# Patient Record
Sex: Female | Born: 1945 | Race: Black or African American | Hispanic: No | Smoking: Never smoker
Health system: Southern US, Community
[De-identification: ages and names within clinical notes are randomized; demographics above are authoritative.]

## PROBLEM LIST (undated history)

## (undated) DIAGNOSIS — G4733 Obstructive sleep apnea (adult) (pediatric): Secondary | ICD-10-CM

## (undated) DIAGNOSIS — K219 Gastro-esophageal reflux disease without esophagitis: Secondary | ICD-10-CM

## (undated) DIAGNOSIS — M5136 Other intervertebral disc degeneration, lumbar region: Secondary | ICD-10-CM

## (undated) DIAGNOSIS — F419 Anxiety disorder, unspecified: Secondary | ICD-10-CM

## (undated) DIAGNOSIS — M150 Primary generalized (osteo)arthritis: Secondary | ICD-10-CM

## (undated) DIAGNOSIS — I2699 Other pulmonary embolism without acute cor pulmonale: Secondary | ICD-10-CM

## (undated) DIAGNOSIS — R7989 Other specified abnormal findings of blood chemistry: Secondary | ICD-10-CM

## (undated) DIAGNOSIS — R569 Unspecified convulsions: Secondary | ICD-10-CM

## (undated) DIAGNOSIS — M47819 Spondylosis without myelopathy or radiculopathy, site unspecified: Secondary | ICD-10-CM

## (undated) DIAGNOSIS — T7840XA Allergy, unspecified, initial encounter: Secondary | ICD-10-CM

## (undated) DIAGNOSIS — E7800 Pure hypercholesterolemia, unspecified: Secondary | ICD-10-CM

## (undated) DIAGNOSIS — F32A Depression, unspecified: Secondary | ICD-10-CM

## (undated) DIAGNOSIS — M51369 Other intervertebral disc degeneration, lumbar region without mention of lumbar back pain or lower extremity pain: Secondary | ICD-10-CM

## (undated) DIAGNOSIS — G4730 Sleep apnea, unspecified: Secondary | ICD-10-CM

## (undated) DIAGNOSIS — M159 Polyosteoarthritis, unspecified: Secondary | ICD-10-CM

## (undated) DIAGNOSIS — I10 Essential (primary) hypertension: Secondary | ICD-10-CM

## (undated) HISTORY — DX: Depression, unspecified: F32.A

## (undated) HISTORY — DX: Obstructive sleep apnea (adult) (pediatric): G47.33

## (undated) HISTORY — DX: Other intervertebral disc degeneration, lumbar region without mention of lumbar back pain or lower extremity pain: M51.369

## (undated) HISTORY — DX: Other specified abnormal findings of blood chemistry: R79.89

## (undated) HISTORY — PX: OTHER SURGICAL HISTORY: SHX169

## (undated) HISTORY — DX: Gastro-esophageal reflux disease without esophagitis: K21.9

## (undated) HISTORY — DX: Allergy, unspecified, initial encounter: T78.40XA

## (undated) HISTORY — DX: Other intervertebral disc degeneration, lumbar region: M51.36

## (undated) HISTORY — DX: Spondylosis without myelopathy or radiculopathy, site unspecified: M47.819

## (undated) HISTORY — PX: CHOLECYSTECTOMY: SHX55

## (undated) HISTORY — DX: Unspecified convulsions: R56.9

## (undated) HISTORY — DX: Sleep apnea, unspecified: G47.30

## (undated) HISTORY — DX: Anxiety disorder, unspecified: F41.9

## (undated) HISTORY — DX: Pure hypercholesterolemia, unspecified: E78.00

## (undated) HISTORY — PX: ABDOMINAL HYSTERECTOMY: SHX81

## (undated) HISTORY — DX: Primary generalized (osteo)arthritis: M15.0

## (undated) HISTORY — DX: Polyosteoarthritis, unspecified: M15.9

## (undated) HISTORY — DX: Essential (primary) hypertension: I10

## (undated) HISTORY — PX: BREAST BIOPSY: SHX20

---

## 2002-03-27 LAB — HM MAMMOGRAPHY

## 2003-08-21 LAB — HM MAMMOGRAPHY

## 2005-01-31 LAB — HM MAMMOGRAPHY

## 2006-07-26 LAB — HM MAMMOGRAPHY

## 2008-05-26 LAB — HM MAMMOGRAPHY

## 2009-09-14 LAB — HM DEXA SCAN

## 2009-09-29 LAB — HM MAMMOGRAPHY

## 2011-09-14 LAB — HM MAMMOGRAPHY

## 2013-05-29 LAB — HM MAMMOGRAPHY

## 2013-08-13 LAB — HM DEXA SCAN

## 2014-10-15 LAB — HM MAMMOGRAPHY

## 2015-10-27 LAB — HM MAMMOGRAPHY

## 2015-11-24 LAB — HM DEXA SCAN

## 2016-10-02 LAB — COMPREHENSIVE METABOLIC PANEL WITH GFR: EGFR (African American): >60

## 2016-11-24 LAB — HM MAMMOGRAPHY

## 2017-01-03 LAB — COMPREHENSIVE METABOLIC PANEL WITH GFR: EGFR (African American): 56

## 2017-01-03 LAB — TSH: TSH: 0.88 (ref 0.41–5.90)

## 2018-01-07 LAB — TSH: TSH: 0.98 (ref 0.41–5.90)

## 2018-01-07 LAB — COMPREHENSIVE METABOLIC PANEL WITH GFR: EGFR (African American): >60

## 2018-01-07 LAB — HEMOGLOBIN A1C: A1c: 5.6

## 2018-01-16 LAB — HM MAMMOGRAPHY

## 2018-01-28 LAB — HM DEXA SCAN

## 2018-07-09 LAB — COMPREHENSIVE METABOLIC PANEL WITH GFR: EGFR (African American): 83

## 2019-01-06 LAB — TSH: TSH: 0.99 (ref 0.41–5.90)

## 2019-01-06 LAB — COMPREHENSIVE METABOLIC PANEL WITH GFR: EGFR (African American): 83

## 2019-01-06 LAB — HEMOGLOBIN A1C: A1c: 5.6

## 2019-01-22 LAB — HM MAMMOGRAPHY

## 2019-07-10 LAB — COMPREHENSIVE METABOLIC PANEL WITH GFR: EGFR (African American): 87

## 2020-02-05 LAB — COMPREHENSIVE METABOLIC PANEL WITH GFR: EGFR (African American): 71

## 2020-03-23 LAB — HM DEXA SCAN

## 2020-05-17 LAB — HM MAMMOGRAPHY

## 2021-03-10 LAB — COMPREHENSIVE METABOLIC PANEL WITH GFR: EGFR (African American): 71

## 2021-07-13 LAB — HM MAMMOGRAPHY

## 2021-09-28 ENCOUNTER — Ambulatory Visit (INDEPENDENT_AMBULATORY_CARE_PROVIDER_SITE_OTHER): Payer: Medicare HMO | Admitting: Family Medicine

## 2021-09-28 ENCOUNTER — Encounter: Payer: Self-pay | Admitting: Family Medicine

## 2021-09-28 VITALS — BP 185/94 | HR 76 | Temp 98.5°F | Ht 62.0 in | Wt 195.4 lb

## 2021-09-28 DIAGNOSIS — R32 Unspecified urinary incontinence: Secondary | ICD-10-CM | POA: Diagnosis not present

## 2021-09-28 DIAGNOSIS — Z87898 Personal history of other specified conditions: Secondary | ICD-10-CM | POA: Diagnosis not present

## 2021-09-28 DIAGNOSIS — E782 Mixed hyperlipidemia: Secondary | ICD-10-CM | POA: Diagnosis not present

## 2021-09-28 DIAGNOSIS — Z7689 Persons encountering health services in other specified circumstances: Secondary | ICD-10-CM

## 2021-09-28 DIAGNOSIS — K219 Gastro-esophageal reflux disease without esophagitis: Secondary | ICD-10-CM | POA: Diagnosis not present

## 2021-09-28 DIAGNOSIS — Z8669 Personal history of other diseases of the nervous system and sense organs: Secondary | ICD-10-CM

## 2021-09-28 DIAGNOSIS — I10 Essential (primary) hypertension: Secondary | ICD-10-CM

## 2021-09-28 DIAGNOSIS — G4733 Obstructive sleep apnea (adult) (pediatric): Secondary | ICD-10-CM

## 2021-09-28 DIAGNOSIS — J302 Other seasonal allergic rhinitis: Secondary | ICD-10-CM | POA: Diagnosis not present

## 2021-09-28 DIAGNOSIS — E785 Hyperlipidemia, unspecified: Secondary | ICD-10-CM | POA: Insufficient documentation

## 2021-09-28 MED ORDER — SPIRONOLACTONE 25 MG PO TABS: 25.0000 mg | ORAL_TABLET | Freq: Every day | ORAL | 3 refills | Status: DC

## 2021-09-28 MED ORDER — FQ NU-FIT ADULT BRIEF XL MISC: 1.0000 | 11 refills | Status: DC | PRN

## 2021-09-28 MED ORDER — LOSARTAN POTASSIUM 100 MG PO TABS: 100.0000 mg | ORAL_TABLET | Freq: Every day | ORAL | 3 refills | Status: DC

## 2021-09-28 MED ORDER — METOPROLOL SUCCINATE ER 25 MG PO TB24: 25.0000 mg | ORAL_TABLET | Freq: Every day | ORAL | 3 refills | Status: DC

## 2021-09-28 MED ORDER — ATORVASTATIN CALCIUM 40 MG PO TABS: 40.0000 mg | ORAL_TABLET | Freq: Every day | ORAL | 3 refills | Status: DC

## 2021-09-28 MED ORDER — MONTELUKAST SODIUM 10 MG PO TABS: 10.0000 mg | ORAL_TABLET | Freq: Every day | ORAL | 3 refills | Status: DC

## 2021-09-28 MED ORDER — TOPIRAMATE 100 MG PO TABS: 100.0000 mg | ORAL_TABLET | Freq: Two times a day (BID) | ORAL | 3 refills | Status: DC

## 2021-09-28 NOTE — Progress Notes (Signed)
? ? ?Patient presents to clinic today to establish care and follow-up on ongoing concerns. ?Patient accompanied by her daughter. ? ?SUBJECTIVE: ?PMH: ?Patient is a 76 year old female with past medical history significant for HTN, arthritis, history of depression, GERD, allergies, HLD, migraines, seizures, urinary incontinence who was previously seen in [place] at [facility] by Dr. Lou Miner. ? ?HTN: ?-Taking spironolactone 25 mg daily, metoprolol XL 25 mg daily, and losartan 100 mg daily ?-Patient endorses med compliance. ?-Requesting refills. ?-Endorses history of OSA.  Not currently on CPAP. ? ?OSA: ?-Previously on CPAP however notes difficulty wearing mask. ?-Has not worn/has not had CPAP in 3 years. ?-Was supposed to schedule a new sleep study prior to leaving [place] in order to get CPAP back. ? ?Migraines: ?-Patient taking topiramate 100 mg twice daily ? ?HLD: ?-Taking atorvastatin 40 mg nightly ? ?Anxiety/depression: ?-Patient states "nerves bother" her. ?-States was on medication several years ago but not currently on medication. ? ?History of arthritis ?-Pt status post bilateral total shoulder replacements ?-Also mentions arthritis in hands. ? ?Pt mentions she was seeing pain clinic in [place] seen 2 months ago.  Did not mention medication or reason for medication. ? ?Seizure history: ?-Unclear formal diagnosis given ?-Per daughter patient had not episode September 2022, bit tongue at time of episode ?-States testing at that time was negative. ? ?Urinary incontinence: ?-Patient endorses urgency/unable to make it to the restroom ?-Patient had 6 children ?-Wearing depends ?-Inquires about prescription for adult briefs. ? ?Seasonal allergies: ?-Taking Singulair 10 mg daily ?-Has Flonase but does not like putting things in her nose ?-Started using OTC Allegra-D a few days ago ? ?GERD: ?-Nexium as needed ? ?Pt inquires about weight loss medication. ? ?Allergies: NKDA, NKFA ? ?Past surgical  history: ?-Bilateral total shoulder replacements ?-Cholecystectomy ?-Hysterectomy 2/2 AUB ?-Breast biopsy, with negative results. ? ?Social history: ?Patient is widowed.  She is a Programmer, systems.  Patient had 6 children.  Patient denies tobacco, alcohol, drug use. ? ?Family medical history: ?Mom-deceased, pancreatic cancer, HTN ?-Father-deceased ?Sister, Velva Harman, alive, thyroid cancer, diabetes, HTN ?Brother-Allen, deceased, prostate cancer?,  Tobacco use ?Daughter-Alive, renal cancer, diabetes ?Son-stroke ?Paternal grandmother gout, HTN ? ?Health Maintenance: ?Dental -- ?Vision --2 years ago ?Immunizations -- ?Colonoscopy -- ?Mammogram -- ?PAP --status post hysterectomy.  Over 40 years ago. ?Bone Density --  ? ?Social History  ? ?Socioeconomic History  ? Marital status: Unknown  ?  Spouse name: Not on file  ? Number of children: Not on file  ? Years of education: Not on file  ? Highest education level: Not on file  ?Occupational History  ? Not on file  ?Tobacco Use  ? Smoking status: Not on file  ? Smokeless tobacco: Not on file  ?Substance and Sexual Activity  ? Alcohol use: Not on file  ? Drug use: Not on file  ? Sexual activity: Not on file  ?Other Topics Concern  ? Not on file  ?Social History Narrative  ? Not on file  ? ?Social Determinants of Health  ? ?Financial Resource Strain: Not on file  ?Food Insecurity: Not on file  ?Transportation Needs: Not on file  ?Physical Activity: Not on file  ?Stress: Not on file  ?Social Connections: Not on file  ?Intimate Partner Violence: Not on file  ? ? ?ROS ?General: Denies fever, chills, night sweats, changes in weight, changes in appetite + weight gain ?HEENT: Denies headaches, ear pain, changes in vision, rhinorrhea, sore throat  + headaches/migraines, nasal drainage ?  CV: Denies CP, palpitations, SOB, orthopnea ?Pulm: Denies SOB, cough, wheezing ?GI: Denies abdominal pain, nausea, vomiting, diarrhea, constipation + GERD ?GU: Denies dysuria, hematuria, frequency,  vaginal discharge ?Msk: Denies muscle cramps + joint pain ?Neuro: Denies weakness, numbness, tingling ?Skin: Denies rashes, bruising ?Psych: Denies depression, anxiety, hallucinations ? ?BP (!) 185/94 (BP Location: Right Arm, Patient Position: Sitting, Cuff Size: Normal)   Pulse 76   Temp 98.5 ?F (36.9 ?C) (Oral)   Ht '5\' 2"'$  (1.575 m)   Wt 195 lb 6.4 oz (88.6 kg)   SpO2 98%   BMI 35.74 kg/m?  ? ?Physical Exam ?Gen. Pleasant, well developed, well-nourished, in NAD, mild confusion with answering some questions. ?HEENT - [place]/AT, PERRL, EOMI, conjunctive clear, no scleral icterus, no nasal drainage, pharynx without erythema or exudate.  TMs normal bilaterally. ?Neck: No JVD, no thyromegaly, no carotid bruits ?Lungs: no use of accessory muscles, CTAB, no wheezes, rales or rhonchi ?Cardiovascular: RRR,  No r/g/m, no peripheral edema ?Abdomen: BS present, soft, nontender, nondistended. ?Musculoskeletal: No deformities, moves all four extremities, no cyanosis or clubbing, normal tone ?Neuro:  A&Ox3, CN II-XII intact, normal gait ?Skin:  Warm, dry, intact, no lesions ? ?No results found for this or any previous visit (from the past 2160 hour(s)). ? ?Assessment/Plan: ? ? On day of service, 45 minutes spent caring for this patient face-to-face, reviewing the chart, counseling and/or coordinating care for plan and treatment of diagnosis below.   ? ?Essential hypertension ?-Uncontrolled ?-Discussed the importance of medication compliance and lifestyle modifications ?--Continue current medications including losartan 100 mg daily, Toprol-XL 25 mg daily, spironolactone 25 mg daily ?-h/o OSA may also be contributing to elevation.  Will place referral for new sleep study. ?-Discussed recent use of antihistamine with decongestant may also be contributing to elevation in BP. ?-We will have patient check BP at home and bring log to clinic. ?- Plan: losartan (COZAAR) 100 MG tablet, metoprolol succinate (TOPROL-XL) 25 MG 24 hr tablet,  spironolactone (ALDACTONE) 25 MG tablet, Ambulatory referral to Sleep Studies ? ?Gastroesophageal reflux disease, unspecified whether esophagitis present ?-Stable ?-Continue Nexium as needed ?-We will place referral to GI if needed based on information from previous records ? ?Seasonal allergies  ?-Discussed avoiding allergy medicine with decongestant as may cause elevation in BP. ?-Continue Flonase and Singulair 10 mg daily ?-Discussed using a different OTC antihistamine such as Zyrtec, Claritin, Xyzal ?- Plan: montelukast (SINGULAIR) 10 MG tablet ? ?History of migraine ?-Refer to neurology ?-Continue Topamax 100 mg twice daily ?-Discussed headache triggers ?- Plan: topiramate (TOPAMAX) 100 MG tablet, Ambulatory referral to Neurology ? ?Urinary incontinence, unspecified type ?- Plan: Incontinence Supply Disposable (NU-FIT ADULT BRIEF) MISC ? ?Mixed hyperlipidemia ?-Lifestyle modifications ?- Plan: atorvastatin (LIPITOR) 40 MG tablet ? ?History of seizure  ?- Plan: Ambulatory referral to Neurology ? ?OSA (obstructive sleep apnea) ?-Previous history of OSA on CPAP. ?-We will place order for repeat sleep study as has not had CPAP in 3 years. ? - Plan: Ambulatory referral to Sleep Studies, Ambulatory referral to Neurology ? ?Encounter to establish care ?--We reviewed the PMH, PSH, FH, SH, Meds and Allergies. ?-We provided refills for any medications we will prescribe as needed. ?-We addressed current concerns per orders and patient instructions. ?-We have asked for records for pertinent exams, studies, vaccines and notes from previous providers. ?-We have advised patient to follow up per instructions below. ? ?F/u as needed in the next 4-6 weeks for CPE. ? ?Grier Mitts, MD ? ?

## 2021-09-29 NOTE — Addendum Note (Signed)
Addended by: Anderson Malta on: 09/29/2021 02:11 PM ? ? Modules accepted: Orders ? ?

## 2021-10-03 ENCOUNTER — Ambulatory Visit (INDEPENDENT_AMBULATORY_CARE_PROVIDER_SITE_OTHER): Payer: Medicare HMO | Admitting: Family Medicine

## 2021-10-03 ENCOUNTER — Other Ambulatory Visit: Payer: Self-pay

## 2021-10-03 ENCOUNTER — Encounter: Payer: Self-pay | Admitting: Family Medicine

## 2021-10-03 VITALS — BP 147/64 | HR 60 | Temp 97.9°F | Ht 62.0 in | Wt 189.8 lb

## 2021-10-03 DIAGNOSIS — Z0000 Encounter for general adult medical examination without abnormal findings: Secondary | ICD-10-CM | POA: Diagnosis not present

## 2021-10-03 DIAGNOSIS — R32 Unspecified urinary incontinence: Secondary | ICD-10-CM | POA: Diagnosis not present

## 2021-10-03 DIAGNOSIS — E782 Mixed hyperlipidemia: Secondary | ICD-10-CM

## 2021-10-03 DIAGNOSIS — I10 Essential (primary) hypertension: Secondary | ICD-10-CM

## 2021-10-03 DIAGNOSIS — Z1159 Encounter for screening for other viral diseases: Secondary | ICD-10-CM

## 2021-10-03 DIAGNOSIS — R748 Abnormal levels of other serum enzymes: Secondary | ICD-10-CM

## 2021-10-03 LAB — CBC WITH DIFFERENTIAL/PLATELET
Basophils Absolute: 0.1 10*3/uL (ref 0.0–0.1)
Basophils Relative: 1.2 % (ref 0.0–3.0)
Eosinophils Absolute: 0.2 10*3/uL (ref 0.0–0.7)
Eosinophils Relative: 1.7 % (ref 0.0–5.0)
HCT: 40.1 % (ref 36.0–46.0)
Hemoglobin: 12.8 g/dL (ref 12.0–15.0)
Lymphocytes Relative: 24.5 % (ref 12.0–46.0)
Lymphs Abs: 2.4 10*3/uL (ref 0.7–4.0)
MCHC: 32 g/dL (ref 30.0–36.0)
MCV: 85 fl (ref 78.0–100.0)
Monocytes Absolute: 0.4 10*3/uL (ref 0.1–1.0)
Monocytes Relative: 4.1 % (ref 3.0–12.0)
Neutro Abs: 6.8 10*3/uL (ref 1.4–7.7)
Neutrophils Relative %: 68.5 % (ref 43.0–77.0)
Platelets: 219 10*3/uL (ref 150.0–400.0)
RBC: 4.72 Mil/uL (ref 3.87–5.11)
RDW: 13.5 % (ref 11.5–15.5)
WBC: 9.9 10*3/uL (ref 4.0–10.5)

## 2021-10-03 LAB — LIPID PANEL
Cholesterol: 153 mg/dL (ref 0–200)
HDL: 56.4 mg/dL (ref >39.00)
LDL Cholesterol: 79 mg/dL (ref 0–99)
NonHDL: 96.88
Total CHOL/HDL Ratio: 3
Triglycerides: 88 mg/dL (ref 0.0–149.0)
VLDL: 17.6 mg/dL (ref 0.0–40.0)

## 2021-10-03 LAB — POCT URINALYSIS DIPSTICK
Bilirubin, UA: NEGATIVE
Glucose, UA: NEGATIVE
Ketones, UA: NEGATIVE
Leukocytes, UA: NEGATIVE
Nitrite, UA: NEGATIVE
Protein, UA: NEGATIVE
Spec Grav, UA: 1.02 (ref 1.010–1.025)
Urobilinogen, UA: NEGATIVE E.U./dL — AB
pH, UA: 6 (ref 5.0–8.0)

## 2021-10-03 LAB — COMPREHENSIVE METABOLIC PANEL
ALT: 15 U/L (ref 0–35)
AST: 19 U/L (ref 0–37)
Albumin: 4.4 g/dL (ref 3.5–5.2)
Alkaline Phosphatase: 129 U/L — ABNORMAL HIGH (ref 39–117)
BUN: 24 mg/dL — ABNORMAL HIGH (ref 6–23)
CO2: 25 mEq/L (ref 19–32)
Calcium: 9.7 mg/dL (ref 8.4–10.5)
Chloride: 111 mEq/L (ref 96–112)
Creatinine, Ser: 0.92 mg/dL (ref 0.40–1.20)
GFR: 61.16 mL/min (ref >60.00)
Glucose, Bld: 87 mg/dL (ref 70–99)
Potassium: 4 mEq/L (ref 3.5–5.1)
Sodium: 141 mEq/L (ref 135–145)
Total Bilirubin: 0.3 mg/dL (ref 0.2–1.2)
Total Protein: 7.1 g/dL (ref 6.0–8.3)

## 2021-10-03 LAB — T4, FREE: Free T4: 0.68 ng/dL (ref 0.60–1.60)

## 2021-10-03 LAB — TSH: TSH: 0.84 u[IU]/mL (ref 0.35–5.50)

## 2021-10-03 LAB — HEMOGLOBIN A1C: Hgb A1c MFr Bld: 5.8 % (ref 4.6–6.5)

## 2021-10-03 MED ORDER — WINGS HL ADULT BRIEFS/XL MISC: 5 refills | Status: DC

## 2021-10-03 NOTE — Addendum Note (Signed)
Addended by: Grier Mitts R on: 10/03/2021 04:46 PM ? ? Modules accepted: Orders ? ?

## 2021-10-03 NOTE — Progress Notes (Addendum)
Subjective:  ?  ? Gina Key is a 76 y.o. female and is here for a comprehensive physical exam. The patient reports no problems.  Over the wknd, pt visited her son who is in a SNF in [place], [place].  Pt states she was able to get her meds refilled, but the pharmacy did not have the rx for adult briefs.  Pt accompanied by her daughter.  Patient states she had mammogram at the end of 2022.  Still awaiting records from previous provider. ? ?Social History  ? ?Socioeconomic History  ? Marital status: Widowed  ?  Spouse name: Not on file  ? Number of children: Not on file  ? Years of education: Not on file  ? Highest education level: Not on file  ?Occupational History  ? Not on file  ?Tobacco Use  ? Smoking status: Never  ? Smokeless tobacco: Never  ?Substance and Sexual Activity  ? Alcohol use: Not on file  ? Drug use: Not on file  ? Sexual activity: Not on file  ?Other Topics Concern  ? Not on file  ?Social History Narrative  ? Not on file  ? ?Social Determinants of Health  ? ?Financial Resource Strain: Not on file  ?Food Insecurity: Not on file  ?Transportation Needs: Not on file  ?Physical Activity: Not on file  ?Stress: Not on file  ?Social Connections: Not on file  ?Intimate Partner Violence: Not on file  ? ?Health Maintenance  ?Topic Date Due  ? Hepatitis C Screening  Never done  ? TETANUS/TDAP  Never done  ? COLONOSCOPY (Pts 45-51yr Insurance coverage will need to be confirmed)  Never done  ? MAMMOGRAM  Never done  ? Zoster Vaccines- Shingrix (1 of 2) Never done  ? Pneumonia Vaccine 75 Years old (1 - PCV) Never done  ? DEXA SCAN  Never done  ? COVID-19 Vaccine (5 - Booster for Moderna series) 06/27/2021  ? INFLUENZA VACCINE  Completed  ? HPV VACCINES  Aged Out  ? ? ?The following portions of the patient's history were reviewed and updated as appropriate: allergies, current medications, past family history, past medical history, past social history, past surgical history, and problem list. ? ?Review of  Systems ?Pertinent items noted in HPI and remainder of comprehensive ROS otherwise negative.  ? ?Objective:  ? ? BP (!) 147/64 (BP Location: Left Arm, Patient Position: Sitting, Cuff Size: Normal)   Pulse 60   Temp 97.9 ?F (36.6 ?C) (Oral)   Ht 5' 2"  (1.575 m)   Wt 189 lb 12.8 oz (86.1 kg)   SpO2 97%   BMI 34.71 kg/m?  ?General appearance: alert, cooperative, and no distress ?Head: Normocephalic, without obvious abnormality, atraumatic ?Eyes: conjunctivae/corneas clear. PERRL, EOM's intact. Fundi benign. ?Ears: normal TM's and external ear canals both ears ?Nose: Nares normal. Septum midline. Mucosa normal. No drainage or sinus tenderness. ?Throat: lips, mucosa, and tongue normal; teeth and gums normal ?Neck: no adenopathy, no carotid bruit, no JVD, supple, symmetrical, trachea midline, and thyroid not enlarged, symmetric, no tenderness/mass/nodules ?Lungs: clear to auscultation bilaterally ?Heart: regular rate and rhythm, S1, S2 normal, no murmur, click, rub or gallop ?Abdomen: soft, non-tender; bowel sounds normal; no masses,  no organomegaly ?Extremities: extremities normal, atraumatic, no cyanosis or edema ?Pulses: 2+ and symmetric ?Skin: Skin color, texture, turgor normal. No rashes or lesions ?Lymph nodes: Cervical, supraclavicular, and axillary nodes normal. ?Neurologic: Alert and oriented X 3, normal strength and tone. Normal symmetric reflexes. Normal coordination and gait  ?  ?  Assessment:  ? ? Healthy female exam.    ?  ?Plan:  ? ? Anticipatory guidance given including wearing seatbelts, smoke detectors in the home, increasing physical activity, increasing p.o. intake of water and vegetables. ?-labs ?-mammogram done late 2022 per pt.  Awaiting records from previous provider ?-Immunization recommendations to be made once records obtained. ?-Given handout ?-Next EPM 1 year ?See After Visit Summary for Counseling Recommendations  ? ?Essential hypertension ?-Chronic issue ?-Elevated.   ?-Lifestyle  modifications ?-Sleep study referral placed last [facility] 09/28/2021 regarding need for new CPAP ?-Continue current medications including losartan 100 mg daily, Toprol XL 25 mg daily, spironolactone 25 mg daily ?-Encouraged to check BP at home and keep a log to bring to clinic.  For continued elevation greater than 140/90 adjust medication. ?- Plan: CBC with Differential/Platelet, TSH, T4, Free, CMP ? ?Mixed hyperlipidemia ?-Chronic issue ?-Continue Lipitor 40 mg daily ?-Lifestyle modifications ?- Plan: Hemoglobin A1c, Lipid panel ? ?Encounter for hepatitis C screening test for low risk patient ? - Plan: Hep C Antibody ? ?Urinary incontinence, unspecified type ?-Chronic issue ?-Prescription for adult briefs reprinted ?-We will obtain UA and urine culture to rule out infection ?-Consider referral to urology based on previous records when available. ?- Plan: POCT urinalysis dipstick, Incontinence Supply Disposable (WINGS HL ADULT BRIEFS/XL) MISC, UCx ? ?Follow-up in the next 2-3 months, sooner if needed ? ?Update:  Alk phos mildly elevated at 129 with normal LFTs.  A GGT was added to lab orders.  Further recs based on results. ? ?Grier Mitts, MD ? ?

## 2021-10-04 ENCOUNTER — Other Ambulatory Visit (INDEPENDENT_AMBULATORY_CARE_PROVIDER_SITE_OTHER): Payer: Medicare HMO

## 2021-10-04 DIAGNOSIS — R748 Abnormal levels of other serum enzymes: Secondary | ICD-10-CM

## 2021-10-04 LAB — HEPATITIS C ANTIBODY
Hepatitis C Ab: NONREACTIVE
SIGNAL TO CUT-OFF: 0.67 (ref <1.00)

## 2021-10-04 LAB — URINE CULTURE
MICRO NUMBER:: 13122504
Result:: NO GROWTH
SPECIMEN QUALITY:: ADEQUATE

## 2021-10-04 LAB — GAMMA GT: GGT: 23 U/L (ref 7–51)

## 2021-10-05 ENCOUNTER — Other Ambulatory Visit: Payer: Self-pay

## 2021-10-05 DIAGNOSIS — Z13820 Encounter for screening for osteoporosis: Secondary | ICD-10-CM

## 2021-12-05 ENCOUNTER — Ambulatory Visit: Payer: Medicare HMO | Admitting: Neurology

## 2021-12-05 ENCOUNTER — Encounter: Payer: Self-pay | Admitting: Neurology

## 2021-12-05 VITALS — BP 163/79 | HR 61 | Ht 62.0 in | Wt 196.0 lb

## 2021-12-05 DIAGNOSIS — Z820 Family history of epilepsy and other diseases of the nervous system: Secondary | ICD-10-CM

## 2021-12-05 DIAGNOSIS — R351 Nocturia: Secondary | ICD-10-CM

## 2021-12-05 DIAGNOSIS — E669 Obesity, unspecified: Secondary | ICD-10-CM

## 2021-12-05 DIAGNOSIS — G4733 Obstructive sleep apnea (adult) (pediatric): Secondary | ICD-10-CM

## 2021-12-05 NOTE — Patient Instructions (Signed)
It was nice to meet you both today! ? ?Here is what we discussed today and what we came up with as our plan for you:  ?  ?Based on your symptoms and your exam I believe you are still at risk for obstructive sleep apnea and would benefit from re-evaluation as it has been a few years and you no longer have a CPAP machine. We should proceed with a sleep study to determine how severe your sleep apnea is. If you have sleep apnea, I want you to consider treatment with CPAP or autoPAP machine.  ?Please remember, the risks and ramifications of moderate to severe obstructive sleep apnea or OSA are: Cardiovascular disease, including congestive heart failure, stroke, difficult to control hypertension, arrhythmias, and even type 2 diabetes has been linked to untreated OSA. Sleep apnea causes disruption of sleep and sleep deprivation in most cases, which, in turn, can cause recurrent headaches, problems with memory, mood, concentration, focus, and vigilance. Most people with untreated sleep apnea report excessive daytime sleepiness, which can affect their ability to drive. Please do not drive if you feel sleepy.  ? ?I will likely see you back after your sleep study to go over the test results and where to go from there. We will call you after your sleep study to advise about the results (most likely, you will hear from my nurse) and to set up an appointment at the time, as necessary.   ? ?Our sleep lab administrative assistant will call you to schedule your sleep study. If you don't hear back from her by about 2 weeks from now, please feel free to call her at [phone]. You can leave a message with your phone number and concerns, if you get the voicemail box. She will call back as soon as possible.  ? ? ?

## 2021-12-05 NOTE — Progress Notes (Signed)
Subjective:  ?  ?Patient ID: Gina Key is a 76 y.o. female. ? ?HPI ? ? ? ?Gina Age, MD, PhD ?[facility] ?[address], [address] ?[phone] ?[place], [place] [address] ? ?Dear Dr. Volanda Key,  ? ?I saw your patient, Gina Key, upon your kind request, in my Sleep clinic today for initial consultation of her sleep disorder, in particular, concern for underlying obstructive sleep apnea.  The patient is accompanied by her daughter, Gina Key, today. As you know, Gina Key is a 50 right-handed woman with an underlying medical history of reflux disease, allergies, history of migraines, history of seizure (by chart review), lipidemia, arthritis, hypertension, and obesity, who was previously diagnosed with obstructive sleep apnea and placed on positive airway pressure treatment.  She is currently no longer on a CPAP or similar machine, prior testing was several years ago and test results are not available for my review today. Testing was in [place]. I reviewed your office note from 09/28/2021.  Her Epworth sleepiness score is 0/24, fatigue severity score is 30 out of 63.  She reports that she used her machine in the beginning, at least for a few months but then the machine broke.  She reports that she had a nightmare and she threw the machine.  She reports a family history of sleep apnea. Her sister, one son and her other daughter have OSA. She lives with Gina Key, she has a total of 6 kids. She is widowed. She would be willing to get retested and start PAP therapy again. Her bedtime is around 11-11:30 PM and rise time is around 5:30 AM, she does not sleep through the night.  She denies recurrent nocturnal or morning headaches.  She has nocturia about once per average night.  She is working on weight loss, weight has been fluctuating.  She is a non-smoker and does not utilize alcohol and does not drink caffeine daily.  She often wakes up in the early morning hours and has trouble  falling asleep but may doze off for a few more hours. ? ?Her Past Medical History Is Significant For: ?Past Medical History:  ?Diagnosis Date  ? Acid reflux   ? High cholesterol   ? Hypertension   ? ? ?Her Past Surgical History Is Significant For: ?Past Surgical History:  ?Procedure Laterality Date  ? CHOLECYSTECTOMY    ? left and right rotator cuff    ? ? ?Her Family History Is Significant For: ?Family History  ?Problem Relation Key of Onset  ? Pancreatic cancer Mother   ? Thyroid cancer Sister   ? Kidney cancer Daughter   ? Sleep apnea Daughter   ? Sleep apnea Son   ? Stroke Son   ? Sleep apnea Son   ? ? ?Her Social History Is Significant For: ?Social History  ? ?Socioeconomic History  ? Marital status: Widowed  ?  Spouse name: Not on file  ? Number of children: 6  ? Years of education: 1  ? Highest education level: Not on file  ?Occupational History  ? Not on file  ?Tobacco Use  ? Smoking status: Never  ? Smokeless tobacco: Never  ?Vaping Use  ? Vaping Use: Never used  ?Substance and Sexual Activity  ? Alcohol use: Never  ? Drug use: Never  ? Sexual activity: Not on file  ?Other Topics Concern  ? Not on file  ?Social History Narrative  ? Lives at home with daughter  ? Right handed  ? Caffeine: none   ? ?  Social Determinants of Health  ? ?Financial Resource Strain: Not on file  ?Food Insecurity: Not on file  ?Transportation Needs: Not on file  ?Physical Activity: Not on file  ?Stress: Not on file  ?Social Connections: Not on file  ? ? ?Her Allergies Are:  ?No Known Allergies:  ? ?Her Current Medications Are:  ?Outpatient Encounter Medications as of 12/05/2021  ?Medication Sig  ? aspirin 325 MG EC tablet Take 325 mg by mouth daily.  ? atorvastatin (LIPITOR) 40 MG tablet Take 1 tablet (40 mg total) by mouth daily.  ? Incontinence Supply Disposable (WINGS HL ADULT BRIEFS/XL) MISC As needed daily.  ? losartan (COZAAR) 100 MG tablet Take 1 tablet (100 mg total) by mouth daily.  ? metoprolol succinate (TOPROL-XL) 25 MG  24 hr tablet Take 1 tablet (25 mg total) by mouth daily.  ? montelukast (SINGULAIR) 10 MG tablet Take 1 tablet (10 mg total) by mouth at bedtime.  ? potassium chloride (KLOR-CON) 10 MEQ tablet Take 10 mEq by mouth daily.  ? spironolactone (ALDACTONE) 25 MG tablet Take 1 tablet (25 mg total) by mouth daily.  ? topiramate (TOPAMAX) 100 MG tablet Take 1 tablet (100 mg total) by mouth 2 (two) times daily.  ? fluticasone (FLONASE) 50 MCG/ACT nasal spray Place into the nose. (Patient not taking: Reported on 12/05/2021)  ? [DISCONTINUED] aspirin 81 MG EC tablet Take by mouth.  ? ?No facility-administered encounter medications on file as of 12/05/2021.  ?: ? ? ?Review of Systems:  ?Out of a complete 14 point review of systems, all are reviewed and negative with the exception of these symptoms as listed below: ? ?Review of Systems  ?Neurological:   ?     Patient is here alone with her daughter for a sleep consult. She had a sleep study and used CPAP approximately 2-3 years ago. She was dreaming one night and thought her machine was attacking her and she threw it. She was advised to restart per primary care. ESS 0, FSS 30.  ? ?Objective:  ?Neurological Exam ? ?Physical Exam ?Physical Examination:  ? ?Vitals:  ? 12/05/21 1504  ?BP: (!) 163/79  ?Pulse: 61  ? ? ?General Examination: The patient is a very pleasant 76 y.o. female in no acute distress. She appears well-developed and well-nourished and well groomed.  ? ?HEENT: Normocephalic, atraumatic, pupils are equal, round and reactive to light, extraocular tracking is good without limitation to gaze excursion or nystagmus noted. Hearing is grossly intact. Face is symmetric with normal facial animation. Speech is clear with no dysarthria noted. There is no hypophonia. There is no lip, neck/head, jaw or voice tremor. Neck is supple with full range of passive and active motion. There are no carotid bruits on auscultation. Oropharynx exam reveals: mild mouth dryness, adequate dental  hygiene and mild airway crowding secondary to small airway entry and redundant soft palate.  Tonsils on the smaller side.  Tongue protrudes centrally and palate elevates symmetrically.  Neck circumference of 14 1/8 inches.  She has a slight underbite.   ? ?Chest: Clear to auscultation without wheezing, rhonchi or crackles noted. ? ?Heart: S1+S2+0, regular and normal without murmurs, rubs or gallops noted.  ? ?Abdomen: Soft, non-tender and non-distended with normal bowel sounds appreciated on auscultation. ? ?Extremities: There is no obvious edema.    ? ?Skin: Warm and dry without trophic changes noted.  ? ?Musculoskeletal: exam reveals no obvious joint deformities.  ? ?Neurologically:  ?Mental status: The patient is awake, alert and  oriented in all 4 spheres. Her immediate and remote memory, attention, language skills and fund of knowledge are appropriate. There is no evidence of aphasia, agnosia, apraxia or anomia. Speech is clear with normal prosody and enunciation. Thought process is linear. Mood is normal and affect is normal.  ?Cranial nerves II - XII are as described above under HEENT exam.  ?Motor exam: Normal bulk, strength and tone is noted. There is no obvious tremor. Fine motor skills and coordination: grossly intact.  ?Cerebellar testing: No dysmetria or intention tremor. There is no truncal or gait ataxia.  ?Sensory exam: intact to light touch in the upper and lower extremities.  ?Gait, station and balance: She stands easily. No veering to one side is noted. No leaning to one side is noted. Posture is Key-appropriate and stance is narrow based. Gait shows normal stride length and normal pace. No problems turning are noted.  ? ?Assessment and Plan:  ?In summary, Gina Key is a very pleasant 76 y.o.-year old female with an underlying medical history of reflux disease, allergies, history of migraines, history of seizure (by chart review), lipidemia, arthritis, hypertension, and obesity, who  presents for evaluation of her obstructive sleep apnea.  She carries a prior diagnosis of obstructive sleep apnea several years ago and had a CPAP machine but no longer has a machine for the past few years.  She w

## 2021-12-10 DIAGNOSIS — Z0100 Encounter for examination of eyes and vision without abnormal findings: Secondary | ICD-10-CM | POA: Diagnosis not present

## 2021-12-10 DIAGNOSIS — H524 Presbyopia: Secondary | ICD-10-CM | POA: Diagnosis not present

## 2021-12-11 ENCOUNTER — Encounter (HOSPITAL_COMMUNITY): Payer: Self-pay | Admitting: Emergency Medicine

## 2021-12-11 ENCOUNTER — Emergency Department (HOSPITAL_COMMUNITY)
Admission: EM | Admit: 2021-12-11 | Discharge: 2021-12-11 | Disposition: A | Discharge status: Home / Self Care | Payer: Medicare HMO | Attending: Emergency Medicine | Admitting: Emergency Medicine

## 2021-12-11 ENCOUNTER — Emergency Department (HOSPITAL_COMMUNITY): Payer: Medicare HMO

## 2021-12-11 DIAGNOSIS — G40909 Epilepsy, unspecified, not intractable, without status epilepticus: Secondary | ICD-10-CM | POA: Diagnosis not present

## 2021-12-11 DIAGNOSIS — R404 Transient alteration of awareness: Secondary | ICD-10-CM | POA: Diagnosis not present

## 2021-12-11 DIAGNOSIS — M4802 Spinal stenosis, cervical region: Secondary | ICD-10-CM | POA: Diagnosis not present

## 2021-12-11 DIAGNOSIS — Z043 Encounter for examination and observation following other accident: Secondary | ICD-10-CM | POA: Diagnosis not present

## 2021-12-11 DIAGNOSIS — R55 Syncope and collapse: Secondary | ICD-10-CM | POA: Insufficient documentation

## 2021-12-11 DIAGNOSIS — M5021 Other cervical disc displacement,  high cervical region: Secondary | ICD-10-CM | POA: Diagnosis not present

## 2021-12-11 DIAGNOSIS — R4182 Altered mental status, unspecified: Secondary | ICD-10-CM | POA: Diagnosis not present

## 2021-12-11 DIAGNOSIS — Z7982 Long term (current) use of aspirin: Secondary | ICD-10-CM | POA: Diagnosis not present

## 2021-12-11 DIAGNOSIS — R569 Unspecified convulsions: Secondary | ICD-10-CM | POA: Diagnosis not present

## 2021-12-11 DIAGNOSIS — R0902 Hypoxemia: Secondary | ICD-10-CM | POA: Diagnosis not present

## 2021-12-11 DIAGNOSIS — R410 Disorientation, unspecified: Secondary | ICD-10-CM | POA: Diagnosis not present

## 2021-12-11 DIAGNOSIS — Z743 Need for continuous supervision: Secondary | ICD-10-CM | POA: Diagnosis not present

## 2021-12-11 LAB — CBG MONITORING, ED: Glucose-Capillary: 79 mg/dL (ref 70–99)

## 2021-12-11 LAB — CBC WITH DIFFERENTIAL/PLATELET
Abs Immature Granulocytes: 0.05 10*3/uL (ref 0.00–0.07)
Basophils Absolute: 0 10*3/uL (ref 0.0–0.1)
Basophils Relative: 0 %
Eosinophils Absolute: 0.1 10*3/uL (ref 0.0–0.5)
Eosinophils Relative: 1 %
HCT: 37.4 % (ref 36.0–46.0)
Hemoglobin: 11.8 g/dL — ABNORMAL LOW (ref 12.0–15.0)
Immature Granulocytes: 1 %
Lymphocytes Relative: 25 %
Lymphs Abs: 2.3 10*3/uL (ref 0.7–4.0)
MCH: 27.5 pg (ref 26.0–34.0)
MCHC: 31.6 g/dL (ref 30.0–36.0)
MCV: 87.2 fL (ref 80.0–100.0)
Monocytes Absolute: 0.4 10*3/uL (ref 0.1–1.0)
Monocytes Relative: 4 %
Neutro Abs: 6.5 10*3/uL (ref 1.7–7.7)
Neutrophils Relative %: 69 %
Platelets: 200 10*3/uL (ref 150–400)
RBC: 4.29 MIL/uL (ref 3.87–5.11)
RDW: 12.9 % (ref 11.5–15.5)
WBC: 9.3 10*3/uL (ref 4.0–10.5)
nRBC: 0 % (ref 0.0–0.2)

## 2021-12-11 LAB — COMPREHENSIVE METABOLIC PANEL
ALT: 18 U/L (ref 0–44)
AST: 26 U/L (ref 15–41)
Albumin: 3.8 g/dL (ref 3.5–5.0)
Alkaline Phosphatase: 117 U/L (ref 38–126)
Anion gap: 4 — ABNORMAL LOW (ref 5–15)
BUN: 11 mg/dL (ref 8–23)
CO2: 21 mmol/L — ABNORMAL LOW (ref 22–32)
Calcium: 9.1 mg/dL (ref 8.9–10.3)
Chloride: 116 mmol/L — ABNORMAL HIGH (ref 98–111)
Creatinine, Ser: 0.94 mg/dL (ref 0.44–1.00)
GFR, Estimated: >60 mL/min (ref >60)
Glucose, Bld: 107 mg/dL — ABNORMAL HIGH (ref 70–99)
Potassium: 3.6 mmol/L (ref 3.5–5.1)
Sodium: 141 mmol/L (ref 135–145)
Total Bilirubin: 0.6 mg/dL (ref 0.3–1.2)
Total Protein: 6.7 g/dL (ref 6.5–8.1)

## 2021-12-11 LAB — MAGNESIUM: Magnesium: 2 mg/dL (ref 1.7–2.4)

## 2021-12-11 MED ORDER — LEVETIRACETAM 500 MG PO TABS: 500.0000 mg | ORAL_TABLET | Freq: Two times a day (BID) | ORAL | 2 refills | Status: DC

## 2021-12-11 MED ORDER — LEVETIRACETAM IN NACL 1000 MG/100ML IV SOLN
1000.0000 mg | Freq: Once | INTRAVENOUS | Status: AC
  Administered 2021-12-11: 1000 mg via INTRAVENOUS
  Filled 2021-12-11: qty 100

## 2021-12-11 MED ORDER — ACETAMINOPHEN 325 MG PO TABS
650.0000 mg | ORAL_TABLET | Freq: Once | ORAL | Status: AC
Start: 2021-12-11 — End: 2021-12-11
  Administered 2021-12-11: 650 mg via ORAL
  Filled 2021-12-11: qty 2

## 2021-12-11 NOTE — Discharge Instructions (Signed)
Get help right away if: °You have: °A seizure that does not stop after 5 minutes. °Several seizures in a row without a complete recovery between seizures. °A seizure that makes it harder to breathe. °A seizure that leaves you unable to speak or use a part of your body. °You do not wake up right away after a seizure. °You injure yourself during a seizure. °You have confusion or pain right after a seizure. °

## 2021-12-11 NOTE — ED Provider Notes (Signed)
[facility] EMERGENCY DEPARTMENT Provider Note   CSN: [id number] Arrival date & time: 12/11/21  7026     History  Chief Complaint  Patient presents with   Altered Mental Status    Gina Key is a 76 y.o. female brought in by EMS for unresponsive event.  Patient has a history of epilepsy.  She states that she used to have seizures many years ago when her husband was still living.  Patient was found on the floor by her bed unresponsive.  EMS reports that when they arrived she was lying on her left side.  She would open her eyes to voice and make eye contact but would not respond.  Approximately 2 minutes later patient asked to be helped off the floor.  She remained confused for some time stating that it was Friday and she was in the year 2003 but is now alert and oriented.  Patient is unsure of how she ended up on the floor.  She states the last thing she remembers she was trying to get ready to go to church.  Patient had a similar event back in August of 2022 where she " bit my tongue in my sleep, and my family was trying to get ahold of me...and I have no recollection" (Per outside record review). She had extensive work-up including an MRI.  No significant findings were evident at that time.  She is not on any antiepileptic medications.  She denies chest pain, shortness of breath, melena, hematochezia.  She had a slight headache that was bitemporal but now resolved.  She denies changes in vision, unilateral weakness.   Altered Mental Status     Home Medications Prior to Admission medications   Medication Sig Start Date End Date Taking? Authorizing Provider  levETIRAcetam (KEPPRA) 500 MG tablet Take 1 tablet (500 mg total) by mouth 2 (two) times daily. 12/11/21  Yes Margarita Mail, PA-C  aspirin 325 MG EC tablet Take 325 mg by mouth daily.    [provider]  atorvastatin (LIPITOR) 40 MG tablet Take 1 tablet (40 mg total) by mouth daily. 09/28/21   Billie Ruddy, MD  fluticasone (FLONASE) 50 MCG/ACT nasal spray Place into the nose. Patient not taking: Reported on 12/05/2021    [provider]  Incontinence Supply Disposable (WINGS HL ADULT BRIEFS/XL) MISC As needed daily. 10/03/21   Billie Ruddy, MD  losartan (COZAAR) 100 MG tablet Take 1 tablet (100 mg total) by mouth daily. 09/28/21   Billie Ruddy, MD  metoprolol succinate (TOPROL-XL) 25 MG 24 hr tablet Take 1 tablet (25 mg total) by mouth daily. 09/28/21   Billie Ruddy, MD  montelukast (SINGULAIR) 10 MG tablet Take 1 tablet (10 mg total) by mouth at bedtime. 09/28/21   Billie Ruddy, MD  potassium chloride (KLOR-CON) 10 MEQ tablet Take 10 mEq by mouth daily.    [provider]  spironolactone (ALDACTONE) 25 MG tablet Take 1 tablet (25 mg total) by mouth daily. 09/28/21   Billie Ruddy, MD  topiramate (TOPAMAX) 100 MG tablet Take 1 tablet (100 mg total) by mouth 2 (two) times daily. 09/28/21   Billie Ruddy, MD      Allergies    Patient has no known allergies.    Review of Systems   Review of Systems  Physical Exam Updated Vital Signs BP (!) 154/85   Pulse 71   Temp 99.3 F (37.4 C) (Oral)   Resp 16  SpO2 100%  Physical Exam Vitals and nursing note reviewed.  Constitutional:      General: She is not in acute distress.    Appearance: She is well-developed. She is not diaphoretic.  HENT:     Head: Normocephalic and atraumatic.     Comments: No tongue trauma    Right Ear: External ear normal.     Left Ear: External ear normal.     Nose: Nose normal.     Mouth/Throat:     Mouth: Mucous membranes are moist.  Eyes:     General: No scleral icterus.    Conjunctiva/sclera: Conjunctivae normal.  Cardiovascular:     Rate and Rhythm: Normal rate and regular rhythm.     Heart sounds: Normal heart sounds. No murmur heard.   No friction rub. No gallop.  Pulmonary:     Effort: Pulmonary effort is normal. No respiratory distress.     Breath sounds:  Normal breath sounds.  Abdominal:     General: Bowel sounds are normal. There is no distension.     Palpations: Abdomen is soft. There is no mass.     Tenderness: There is no abdominal tenderness. There is no guarding.  Musculoskeletal:     Cervical back: Normal range of motion.  Skin:    General: Skin is warm and dry.  Neurological:     General: No focal deficit present.     Mental Status: She is alert and oriented to person, place, and time.     Cranial Nerves: No cranial nerve deficit.     Sensory: No sensory deficit.     Motor: No weakness.     Coordination: Coordination normal.     Deep Tendon Reflexes: Reflexes normal.  Psychiatric:        Behavior: Behavior normal.    ED Results / Procedures / Treatments   Labs (all labs ordered are listed, but only abnormal results are displayed) Labs Reviewed  COMPREHENSIVE METABOLIC PANEL - Abnormal; Notable for the following components:      Result Value   Chloride 116 (*)    CO2 21 (*)    Glucose, Bld 107 (*)    Anion gap 4 (*)    All other components within normal limits  CBC WITH DIFFERENTIAL/PLATELET - Abnormal; Notable for the following components:   Hemoglobin 11.8 (*)    All other components within normal limits  MAGNESIUM  URINALYSIS, ROUTINE W REFLEX MICROSCOPIC  CBG MONITORING, ED    EKG None  Radiology CT HEAD WO CONTRAST  Result Date: 12/11/2021 CLINICAL DATA:  76 year old female with altered mental status and fall. EXAM: CT HEAD WITHOUT CONTRAST CT CERVICAL SPINE WITHOUT CONTRAST TECHNIQUE: Multidetector CT imaging of the head and cervical spine was performed following the standard protocol without intravenous contrast. Multiplanar CT image reconstructions of the cervical spine were also generated. RADIATION DOSE REDUCTION: This exam was performed according to the departmental dose-optimization program which includes automated exposure control, adjustment of the mA and/or kV according to patient size and/or use of  iterative reconstruction technique. COMPARISON:  None Available. FINDINGS: CT HEAD FINDINGS Brain: No evidence of acute infarction, hemorrhage, hydrocephalus, extra-axial collection or mass lesion/mass effect. Vascular: Carotid atherosclerotic calcifications are noted. Skull: Normal. Negative for fracture or focal lesion. Sinuses/Orbits: No acute finding. Other: None. CT CERVICAL SPINE FINDINGS Alignment: Normal. Skull base and vertebrae: No acute fracture. No primary bone lesion or focal pathologic process. Soft tissues and spinal canal: No prevertebral fluid or swelling. No visible canal hematoma.  Disc levels: Multilevel degenerative disc disease/spondylosis and RIGHT facet arthropathy noted. The following findings are noted: C2-3: A small central disc protrusion is noted with moderate RIGHT bony foraminal narrowing C3-4: Mild central spinal and moderate RIGHT bony foraminal narrowing C4-5: Moderate central spinal and mild to moderate RIGHT bony foraminal narrowing C5-C6: With broad-based disc bulge, mild to moderate central spinal and moderate bony biforaminal narrowing C6-7: With broad-based disc bulge, mild central spinal, mild RIGHT and moderate LEFT bony foraminal narrowing Upper chest: No acute abnormality Other: None IMPRESSION: 1. No evidence of acute intracranial abnormality. 2. No static evidence of acute injury to the cervical spine. Degenerative changes as described. Electronically Signed   By: Margarette Canada M.D.   On: 12/11/2021 11:08   CT Cervical Spine Wo Contrast  Result Date: 12/11/2021 CLINICAL DATA:  76 year old female with altered mental status and fall. EXAM: CT HEAD WITHOUT CONTRAST CT CERVICAL SPINE WITHOUT CONTRAST TECHNIQUE: Multidetector CT imaging of the head and cervical spine was performed following the standard protocol without intravenous contrast. Multiplanar CT image reconstructions of the cervical spine were also generated. RADIATION DOSE REDUCTION: This exam was performed  according to the departmental dose-optimization program which includes automated exposure control, adjustment of the mA and/or kV according to patient size and/or use of iterative reconstruction technique. COMPARISON:  None Available. FINDINGS: CT HEAD FINDINGS Brain: No evidence of acute infarction, hemorrhage, hydrocephalus, extra-axial collection or mass lesion/mass effect. Vascular: Carotid atherosclerotic calcifications are noted. Skull: Normal. Negative for fracture or focal lesion. Sinuses/Orbits: No acute finding. Other: None. CT CERVICAL SPINE FINDINGS Alignment: Normal. Skull base and vertebrae: No acute fracture. No primary bone lesion or focal pathologic process. Soft tissues and spinal canal: No prevertebral fluid or swelling. No visible canal hematoma. Disc levels: Multilevel degenerative disc disease/spondylosis and RIGHT facet arthropathy noted. The following findings are noted: C2-3: A small central disc protrusion is noted with moderate RIGHT bony foraminal narrowing C3-4: Mild central spinal and moderate RIGHT bony foraminal narrowing C4-5: Moderate central spinal and mild to moderate RIGHT bony foraminal narrowing C5-C6: With broad-based disc bulge, mild to moderate central spinal and moderate bony biforaminal narrowing C6-7: With broad-based disc bulge, mild central spinal, mild RIGHT and moderate LEFT bony foraminal narrowing Upper chest: No acute abnormality Other: None IMPRESSION: 1. No evidence of acute intracranial abnormality. 2. No static evidence of acute injury to the cervical spine. Degenerative changes as described. Electronically Signed   By: Margarette Canada M.D.   On: 12/11/2021 11:08    Procedures Procedures  Time  Medications Ordered in ED Medications  levETIRAcetam (KEPPRA) IVPB 1000 mg/100 mL premix (0 mg Intravenous Stopped 12/11/21 1349)  levETIRAcetam (KEPPRA) IVPB 1000 mg/100 mL premix (0 mg Intravenous Stopped 12/11/21 1349)  acetaminophen (TYLENOL) tablet 650 mg (650  mg Oral Given 12/11/21 1420)    ED Course/ Medical Decision Making/ A&P Clinical Course as of 12/11/21 1556  Sun Dec 11, 2021  1223 Case discussed with Dr. Cheral Marker- recommends 2 gram IV keppra loading dose, 500 mg po keppra BID. OP neuro and EEG follow up [AH]    Clinical Course User Index [AH] Margarita Mail, PA-C                           Medical Decision Making 76 year old female who presents with loss of consciousness, altered mental status and likely seizure. The differential diagnosis for AMS is extensive and includes, but is not limited to: drug overdose - opioids,  alcohol, sedatives, antipsychotics, drug withdrawal, others; Metabolic: hypoxia, hypoglycemia, hyperglycemia, hypercalcemia, hypernatremia, hyponatremia, uremia, hepatic encephalopathy, hypothyroidism, hyperthyroidism, vitamin B12 or thiamine deficiency, carbon monoxide poisoning, Wilson's disease, Lactic acidosis,DKA/HHOS; Infectious: meningitis, encephalitis, bacteremia/sepsis, urinary tract infection, pneumonia, neurosyphilis; Structural: Space-occupying lesion, (brain tumor, subdural hematoma, hydrocephalus,); Vascular: stroke, subarachnoid hemorrhage, coronary ischemia, hypertensive encephalopathy, CNS vasculitis, thrombotic thrombocytopenic purpura, disseminated intravascular coagulation, hyperviscosity; Psychiatric: Schizophrenia, depression; Other: Seizure, hypothermia, heat stroke, ICU psychosis, dementia -"sundowning."  Review of all data points and work-p patient appears to have had a seizure.  I discussed the case with Dr. Cheral Marker who is in agreement and recommends that the patient get started on Keppra with a 2000 mg loading dose.  Patient does not appear to have significant electrolyte disturbance.  CT head imaging negative for acute abnormality including head bleed.  Seizure precautions and follow-up discussed with patient and her family at bedside.  Amount and/or Complexity of Data Reviewed Labs: ordered.     Details: I reviewed all of the patient's labs.  She has mild hyperglycemia, hemoglobin 11.8, no significant metabolic derangements including a normal magnesium level Radiology: ordered.    Details: I ordered reviewed visualized and interpreted head and cervical spine CT which shows no acute findings  Risk OTC drugs. Prescription drug management.      Final Clinical Impression(s) / ED Diagnoses Final diagnoses:  Seizure ([place])    Rx / DC Orders ED Discharge Orders          Ordered    levETIRAcetam (KEPPRA) 500 MG tablet  2 times daily        12/11/21 1407    Ambulatory referral to Neurology       Comments: An appointment is requested in approximately: 2 weeks   12/11/21 1409              Margarita Mail, PA-C 12/11/21 1556    Blanchie Dessert, MD 12/15/21 [phone]

## 2021-12-11 NOTE — ED Triage Notes (Signed)
PT BIB [facility] with reports of possible seizures. Family found patient lying in the floor of her bedroom. EMS reports when they arrived pt would respond to voice. PT was confused. PT is alert and oriented x 3.

## 2021-12-13 ENCOUNTER — Encounter: Payer: Self-pay | Admitting: Neurology

## 2021-12-13 ENCOUNTER — Ambulatory Visit: Payer: Medicare HMO | Admitting: Neurology

## 2021-12-13 VITALS — BP 146/73 | HR 59 | Ht 62.0 in | Wt 193.8 lb

## 2021-12-13 DIAGNOSIS — R569 Unspecified convulsions: Secondary | ICD-10-CM | POA: Diagnosis not present

## 2021-12-13 DIAGNOSIS — R4189 Other symptoms and signs involving cognitive functions and awareness: Secondary | ICD-10-CM

## 2021-12-13 NOTE — Progress Notes (Signed)
Subjective:    Patient ID: Gina Key is a 76 y.o. female.  HPI    Interim history:   Ms. Gina Key is a 76 year old right-handed woman with an underlying medical history of reflux disease, allergies, history of migraines, history of seizure (by chart review), lipidemia, arthritis, hypertension, and obesity, who Presents for a new problem visit for a suspected seizure.  She is referred by the emergency room.  I recently saw her on 12/05/2021 at the request of her primary care physician for evaluation of her sleep apnea.  The patient is accompanied by her daughter today.  She presented to the emergency room on 12/11/2021 via EMS.  She she was found lying on the floor on her side, she appeared confused, no convulsion or twitching was seen.  No evidence of tongue bite or urinary incontinence or bowel incontinence.  I reviewed the emergency room records from 12/11/2021.  She had a CT head without contrast and cervical spine CT without contrast on 12/11/2021 and I reviewed the results: IMPRESSION: 1. No evidence of acute intracranial abnormality. 2. No static evidence of acute injury to the cervical spine. Degenerative changes as described.   She has been given IV Keppra 2 g and and started on oral Keppra 500 mg twice daily.  Laboratory testing showed sodium of 141, potassium on the lower end at 3.6, chloride 116, slightly elevated, glucose 107, BUN 11, creatinine 0.94, normal AST and ALT at 26 and 18, respectively.  CBC with differential and platelet benign with the exception of borderline low hemoglobin at 11.8.  Magnesium was normal at 2.0.  Of note, she had a brain MRI without contrast through [facility] on 03/04/2021 and I reviewed the results: Impression: 1. No acute intracranial process. No acute infarct.  2. Age-related cerebral atrophy and chronic small vessel white matter disease.   She had presented to the emergency room with at [facility] in [place],  [place] on 03/04/2021 with a chief complaint of memory loss and having bitten her tongue in her sleep.  She reported a memory lapse.  She also reported recurrent headaches for about 3 weeks.  She had a head CT without contrast on 03/04/2021 and I reviewed the results: Impression: No acute intracranial abnormality.  Today, 12/13/2021: She reports feeling okay, no new complaints, daughter feels that she is not back to normal yet, she seems sluggish in her thinking and confused at times.  She has not had any bruising and no injuries, daughter reports that she found her in a small space at the foot end of her bed on the floor mumbling and fidgeting with her walker.  She had not hurt herself against the foot end of the bed or sustaining any bruising.  Patient does not recall falling.  She does not recall feeling lightheaded or dizzy or nauseated.  She was getting dressed in the morning and her daughter checked on her to see what she would like for breakfast.  Shortly before patient had a conversation with the son-in-law in the kitchen.  She has had some milder recurrent headaches.  She admits that she does not hydrate well with water, estimates that she drinks 1 or 2 bottles of water per day.  She drinks occasional soda, no coffee.  She sustained no tongue bite and no bladder or bowel incontinence.  She is on Keppra 500 mg twice daily.  Previously:   12/05/21: (She) was previously diagnosed with obstructive sleep apnea  and placed on positive airway pressure treatment.  She is currently no longer on a CPAP or similar machine, prior testing was several years ago and test results are not available for my review today. Testing was in [place]. I reviewed your office note from 09/28/2021.  Her Epworth sleepiness score is 0/24, fatigue severity score is 30 out of 63.  She reports that she used her machine in the beginning, at least for a few months but then the machine broke.  She reports that she had a nightmare and she threw the  machine.  She reports a family history of sleep apnea. Her sister, one son and her other daughter have OSA. She lives with Gina Key, she has a total of 6 kids. She is widowed. She would be willing to get retested and start PAP therapy again. Her bedtime is around 11-11:30 PM and rise time is around 5:30 AM, she does not sleep through the night.  She denies recurrent nocturnal or morning headaches.  She has nocturia about once per average night.  She is working on weight loss, weight has been fluctuating.  She is a non-smoker and does not utilize alcohol and does not drink caffeine daily.  She often wakes up in the early morning hours and has trouble falling asleep but may doze off for a few more hours.  Her Past Medical History Is Significant For: Past Medical History:  Diagnosis Date   Acid reflux    High cholesterol    Hypertension     Her Past Surgical History Is Significant For: Past Surgical History:  Procedure Laterality Date   CHOLECYSTECTOMY     left and right rotator cuff      Her Family History Is Significant For: Family History  Problem Relation Age of Onset   Pancreatic cancer Mother    Thyroid cancer Sister    Seizures Paternal Grandmother    Kidney cancer Daughter    Sleep apnea Daughter    Sleep apnea Son    Stroke Son    Seizures Son    Sleep apnea Son    Seizures Grandson     Her Social History Is Significant For: Social History   Socioeconomic History   Marital status: Widowed    Spouse name: Not on file   Number of children: 6   Years of education: 42   Highest education level: Not on file  Occupational History   Not on file  Tobacco Use   Smoking status: Never   Smokeless tobacco: Never  Vaping Use   Vaping Use: Never used  Substance and Sexual Activity   Alcohol use: Never   Drug use: Never   Sexual activity: Not on file  Other Topics Concern   Not on file  Social History Narrative   Lives at home with daughter   Right handed   Caffeine: none     Social Determinants of Health   Financial Resource Strain: Not on file  Food Insecurity: Not on file  Transportation Needs: Not on file  Physical Activity: Not on file  Stress: Not on file  Social Connections: Not on file    Her Allergies Are:  No Known Allergies:   Her Current Medications Are:  Outpatient Encounter Medications as of 12/13/2021  Medication Sig   aspirin 325 MG EC tablet Take 325 mg by mouth daily.   atorvastatin (LIPITOR) 40 MG tablet Take 1 tablet (40 mg total) by mouth daily.   fluticasone (FLONASE) 50 MCG/ACT nasal spray Place  into the nose.   Incontinence Supply Disposable (WINGS HL ADULT BRIEFS/XL) MISC As needed daily.   levETIRAcetam (KEPPRA) 500 MG tablet Take 1 tablet (500 mg total) by mouth 2 (two) times daily.   losartan (COZAAR) 100 MG tablet Take 1 tablet (100 mg total) by mouth daily.   metoprolol succinate (TOPROL-XL) 25 MG 24 hr tablet Take 1 tablet (25 mg total) by mouth daily.   montelukast (SINGULAIR) 10 MG tablet Take 1 tablet (10 mg total) by mouth at bedtime.   potassium chloride (KLOR-CON) 10 MEQ tablet Take 10 mEq by mouth daily.   spironolactone (ALDACTONE) 25 MG tablet Take 1 tablet (25 mg total) by mouth daily.   topiramate (TOPAMAX) 100 MG tablet Take 1 tablet (100 mg total) by mouth 2 (two) times daily.   No facility-administered encounter medications on file as of 12/13/2021.  :  Review of Systems:  Out of a complete 14 point review of systems, all are reviewed and negative with the exception of these symptoms as listed below: Review of Systems  Neurological:        Pt is here for seizure follow up pt went to ED Sunday because daughter found patient on floor unresponsive no CPR  needed . Pt states ED  recommended  EEG  . Pt has questions about Medication    Objective:  Neurological Exam  Physical Exam Physical Examination:   Vitals:   12/13/21 1127  BP: (!) 146/73  Pulse: (!) 59   General Examination: The patient is a  very pleasant 76 y.o. female in no acute distress. She appears well-developed and well-nourished and well\ groomed.   HEENT: Normocephalic, atraumatic, pupils are equal, round and reactive to light, corrective eyeglasses in place, tracking well-preserved, no nystagmus.  Face is symmetric with normal facial animation, speech is clear without dysarthria. Neck is supple with full range of passive and active motion. There are no carotid bruits on auscultation. Oropharynx exam reveals: mild mouth dryness, adequate dental hygiene and mild airway crowding.  Tongue protrudes centrally and palate elevates symmetrically, no evidence of tongue laceration.   Chest: Clear to auscultation without wheezing, rhonchi or crackles noted.   Heart: S1+S2+0, regular and normal without murmurs, rubs or gallops noted.    Abdomen: Soft, non-tender and non-distended.   Extremities: There is no obvious edema.      Skin: Warm and dry without trophic changes noted.    Musculoskeletal: exam reveals no obvious joint deformities.    Neurologically:  Mental status: The patient is awake, alert and oriented in all 4 spheres. Her immediate and remote memory, attention, language skills and fund of knowledge are appropriate. There is no evidence of aphasia, agnosia, apraxia or anomia. Speech is clear with normal prosody and enunciation. Thought process is linear. Mood is normal and affect is normal.  Cranial nerves II - XII are as described above under HEENT exam.  Motor exam: Normal bulk, strength and tone is noted. There is no obvious tremor. Fine motor skills and coordination: grossly intact.  Cerebellar testing: No dysmetria or intention tremor. There is no truncal or gait ataxia.  Reflexes are 1+ in the upper extremities, trace in the knees and absent in the ankles, toes are downgoing bilaterally. Sensory exam: intact to light touch in the upper and lower extremities.  Gait, station and balance: She stands slowly and walks  slowly, no walking aid.    Assessment and Plan:  In summary, Jameyah Fennewald is a very pleasant 76 year old female  with an underlying medical history of reflux disease, allergies, history of migraines, history of seizure (by chart review), lipidemia, arthritis, hypertension, and obesity, who presents for evaluation of a suspected seizure recently.  She presented via EMS to the emergency room on 12/11/2021 after being found down at home.  She was awake but not alert, she was not fully responsive.  Differential diagnosis includes an unwitnessed seizure versus syncopal event.  She had some work-up in the emergency room and is advised to continue with Keppra 500 mg twice daily at this time for a suspected seizure event.  We will continue with pursuing work-up for sleep apnea and I will order an EEG.  We will call her with her results and plan a follow-up in this clinic afterwards.  We talked about seizure precautions including no driving.  She does not drive.  We talked about seizure triggers and syncope triggers as well today.  In particular, she is advised to stay better hydrated with water.  She is advised to use her walker for gait safety at all times.  She usually brings her walker and they usually keep it in the car trunk. I answered all the questions today and the patient and her daughter were in agreement. I spent 45 minutes in total face-to-face time and in reviewing records during pre-charting, more than 50% of which was spent in counseling and coordination of care, reviewing test results, reviewing medications and treatment regimen and/or in discussing or reviewing the diagnosis of suspected seizure, the prognosis and treatment options. Pertinent laboratory and imaging test results that were available during this visit with the patient were reviewed by me and considered in my medical decision making (see chart for details).

## 2021-12-13 NOTE — Patient Instructions (Addendum)
It was nice to see you both today.  As discussed, you were suspected to have a seizure recently.  I would recommend that we proceed with an EEG (brainwave test), which we will schedule. We will call you with the results.  As planned, we will also proceed with a sleep study.  Please continue with your new seizure medication called Keppra.  Please remember, common seizure triggers are: Sleep deprivation, dehydration, overheating, stress, hypoglycemia or skipping meals, certain medications or excessive alcohol use, especially stopping alcohol abruptly if you have had heavy alcohol use before (aka alcohol withdrawal seizure). If you have a prolonged seizure over 2-5 minutes or back to back seizures, call or have someone call 911 or take you to the nearest emergency room. You cannot drive a car or operate any other machinery or vehicle within 6 months of a seizure. Please do not swim alone or take a tub bath for safety. Do not climb on ladders or be at heights alone. Do not cook with large quantities of boiling water or oil for safety. Please ensure the water temperature at home is not too high. When carrying or caring for small children and infants, make sure you sit down when holding the child are feeding the child or changing them to minimize risk for injury to the child are to you if you were to have a seizure.  Take your medicine for seizure prevention regularly and do not skip doses or stop medication abruptly and tone are told to do so by your healthcare provider. Try to get a refill on your antiepileptic medication ahead of time, so you are not at risk of running out. If you run out of the seizure medication and do not have a refill at hand she may run into medication withdrawal seizures. Avoid taking Wellbutrin, narcotic pain medications and tramadol, as they can lower seizure threshold.  As per [facility] statutes, patients with seizures and epilepsy are not allowed to drive until they have  been seizure-free for at least 6 months. This also applies to driving or using heavy equipment or power tools.

## 2021-12-15 ENCOUNTER — Ambulatory Visit: Payer: Medicare HMO | Admitting: Neurology

## 2021-12-15 DIAGNOSIS — R4189 Other symptoms and signs involving cognitive functions and awareness: Secondary | ICD-10-CM

## 2021-12-15 DIAGNOSIS — R569 Unspecified convulsions: Secondary | ICD-10-CM | POA: Diagnosis not present

## 2021-12-15 NOTE — Procedures (Signed)
    History:  76 year old woman with episode of unresponsiveness.   EEG classification: Awake and drowsy  Description of the recording: The background rhythms of this recording consists of a fairly well modulated medium amplitude alpha rhythm of 10-11 Hz that is reactive to eye opening and closure. As the record progresses, the patient appears to remain in the waking state throughout the recording. Photic stimulation was performed, did not show any abnormalities. Hyperventilation was also performed, did not show any abnormalities. Toward the end of the recording, the patient enters the drowsy state with slight symmetric slowing seen. The patient never enters stage II sleep. No abnormal epileptiform discharges seen during this recording. There was no focal slowing. EKG monitor shows no evidence of cardiac rhythm abnormalities with a heart rate of 60.  Abnormality: None   Impression: This is a normal EEG recording in the waking and drowsy state. No evidence of interictal epileptiform discharges seen. A normal EEG does not exclude a diagnosis of epilepsy.    Alric Ran, MD [facility]

## 2021-12-20 ENCOUNTER — Telehealth: Payer: Self-pay | Admitting: Neurology

## 2021-12-20 NOTE — Telephone Encounter (Signed)
Spoke to the patient daughter on Friday 12/16/21 to schedule the HST, she stated that they were told to do a inlab that it would be better.Marland Kitchen   [employer] medicare pending uploaded notes on the portal.

## 2021-12-21 NOTE — Telephone Encounter (Signed)
Checked status on the portal it is pending [employer] review.

## 2021-12-29 NOTE — Telephone Encounter (Signed)
Sent additional information yesterday 12/28/21, called today to check the status it is still pending.

## 2022-01-02 NOTE — Telephone Encounter (Signed)
Patient daughter [name] called back I informed her that Bernadene Person is not approving the In lab sleep study.   She is scheduled for the HST for 01/17/22 at 4 pm.

## 2022-01-02 NOTE — Telephone Encounter (Signed)
[employer] did not approve the study. Insurance is wanting the HST first.  I left a voicemail for the patient daughter to call me back to schedule the HST.

## 2022-01-05 ENCOUNTER — Encounter: Payer: Self-pay | Admitting: Family Medicine

## 2022-01-05 ENCOUNTER — Ambulatory Visit (INDEPENDENT_AMBULATORY_CARE_PROVIDER_SITE_OTHER): Payer: Medicare HMO | Admitting: Family Medicine

## 2022-01-05 VITALS — BP 124/72 | HR 76 | Temp 98.0°F | Ht 62.0 in | Wt 196.8 lb

## 2022-01-05 DIAGNOSIS — I10 Essential (primary) hypertension: Secondary | ICD-10-CM

## 2022-01-05 DIAGNOSIS — R413 Other amnesia: Secondary | ICD-10-CM | POA: Diagnosis not present

## 2022-01-05 DIAGNOSIS — R569 Unspecified convulsions: Secondary | ICD-10-CM

## 2022-01-05 NOTE — Progress Notes (Incomplete)
Subjective:    Patient ID: Gina Key, female    DOB: May 16, 1946, 76 y.o.   MRN: [id number]  Chief Complaint  Patient presents with  . Follow-up     MRI (daughter thinks pt has dementia)  Discuss hospitalization     HPI Patient was seen today for f/u.  Pt seen in ED 12/11/21 for unresponsiveness  Past Medical History:  Diagnosis Date  . Acid reflux   . High cholesterol   . Hypertension     No Known Allergies  ROS General: Denies fever, chills, night sweats, changes in weight, changes in appetite HEENT: Denies headaches, ear pain, changes in vision, rhinorrhea, sore throat CV: Denies CP, palpitations, SOB, orthopnea Pulm: Denies SOB, cough, wheezing GI: Denies abdominal pain, nausea, vomiting, diarrhea, constipation GU: Denies dysuria, hematuria, frequency, vaginal discharge Msk: Denies muscle cramps, joint pains Neuro: Denies weakness, numbness, tingling Skin: Denies rashes, bruising Psych: Denies depression, anxiety, hallucinations      Objective:    Blood pressure 124/72, pulse 76, temperature 98 F (36.7 C), temperature source Oral, height '5\' 2"'$  (1.575 m), weight 196 lb 12.8 oz (89.3 kg), SpO2 98 %.   Gen. Pleasant, well-nourished, in no distress, normal affect  *** HEENT: [place]/AT, face symmetric, conjunctiva clear, no scleral icterus, PERRLA, EOMI, nares patent without drainage, pharynx without erythema or exudate. Neck: No JVD, no thyromegaly, no carotid bruits Lungs: no accessory muscle use, CTAB, no wheezes or rales Cardiovascular: RRR, no m/r/g, no ***peripheral edema Abdomen: BS present, soft, NT/ND, no hepatosplenomegaly. Musculoskeletal: No deformities, no cyanosis or clubbing, normal tone Neuro:  A&Ox3, CN II-XII intact, normal gait Skin:  Warm, no lesions/ rash   Wt Readings from Last 3 Encounters:  01/05/22 196 lb 12.8 oz (89.3 kg)  12/13/21 193 lb 12.8 oz (87.9 kg)  12/05/21 196 lb (88.9 kg)    Lab Results  Component Value Date   WBC 9.3  12/11/2021   HGB 11.8 (L) 12/11/2021   HCT 37.4 12/11/2021   PLT 200 12/11/2021   GLUCOSE 107 (H) 12/11/2021   CHOL 153 10/03/2021   TRIG 88.0 10/03/2021   HDL 56.40 10/03/2021   LDLCALC 79 10/03/2021   ALT 18 12/11/2021   AST 26 12/11/2021   NA 141 12/11/2021   K 3.6 12/11/2021   CL 116 (H) 12/11/2021   CREATININE 0.94 12/11/2021   BUN 11 12/11/2021   CO2 21 (L) 12/11/2021   TSH 0.84 10/03/2021   HGBA1C 5.8 10/03/2021    Assessment/Plan:  No diagnosis found.  F/u ***  Grier Mitts, MD

## 2022-01-11 ENCOUNTER — Telehealth: Payer: Self-pay | Admitting: Family Medicine

## 2022-01-11 NOTE — Telephone Encounter (Signed)
Left message for patient to call back and schedule Medicare Annual Wellness Visit (AWV) either virtually or in office. Left  my jabber number [phone]    awvi 07/24/09 per [facility]  please schedule at anytime with [facility] Nurse Health Advisor 1 or 2   This should be a 45 minute visit.  

## 2022-01-17 ENCOUNTER — Encounter: Payer: Self-pay | Admitting: Family Medicine

## 2022-01-17 ENCOUNTER — Ambulatory Visit: Payer: Medicare HMO | Admitting: Neurology

## 2022-01-17 DIAGNOSIS — G4733 Obstructive sleep apnea (adult) (pediatric): Secondary | ICD-10-CM | POA: Diagnosis not present

## 2022-01-17 DIAGNOSIS — E669 Obesity, unspecified: Secondary | ICD-10-CM

## 2022-01-17 DIAGNOSIS — Z820 Family history of epilepsy and other diseases of the nervous system: Secondary | ICD-10-CM

## 2022-01-17 DIAGNOSIS — R351 Nocturia: Secondary | ICD-10-CM

## 2022-01-19 NOTE — Progress Notes (Signed)
See procedure note.

## 2022-01-20 NOTE — Addendum Note (Signed)
Addended by: Star Age on: 01/20/2022 12:33 PM   Modules accepted: Orders

## 2022-01-20 NOTE — Procedures (Signed)
   [facility]  HOME SLEEP TEST (Watch PAT) REPORT  STUDY DATE: 01/17/2022  DOB: 02/16/1946  MRN: [id number]  ORDERING CLINICIAN: Star Age, MD, PhD   REFERRING CLINICIAN: Billie Ruddy, MD   CLINICAL INFORMATION/HISTORY: Gina Key with an underlying medical history of reflux disease, allergies, history of migraines, history of seizure (by chart review), lipidemia, arthritis, hypertension, and obesity, who was previously diagnosed with obstructive sleep apnea and placed on positive airway pressure treatment.  She is currently no longer on a PAP machine  Epworth sleepiness score: 0/24.  BMI: 36.1 kg/m  FINDINGS:   Sleep Summary:   Total Recording Time (hours, min): 9 hours, 12 min  Total Sleep Time (hours, min):  8 hours, 28 min  Percent REM (%):    19.1%   Respiratory Indices:   Calculated pAHI (per hour):  8.2/hour         REM pAHI:    15.9/hour       NREM pAHI: 6.3/hour  Central pAHI: 0.9/hour  Oxygen Saturation Statistics:    Oxygen Saturation (%) Mean: 95%   Minimum oxygen saturation (%):                 87%   O2 Saturation Range (%): 87-98%    O2 Saturation (minutes) <=88%: 0 min  Pulse Rate Statistics:   Pulse Mean (bpm):    64/min    Pulse Range (46-80/min)   IMPRESSION: OSA (obstructive sleep apnea), mild  RECOMMENDATION:  This home sleep test demonstrates overall mild obstructive sleep apnea with a total AHI of 8.2/hour and O2 nadir of 87%. Snoring was detected, and appeared to be mild to moderate, intermittent. Given the patient's medical history and sleep related complaints, therapy with a  positive airway pressure device is a reasonable first-line choice and clinically recommended. Treatment can be achieved in the form of autoPAP trial/titration at home for now. A full night, in-lab PAP titration study may aid in improving proper treatment settings and with mask fit, if needed, down the road. Alternative  treatments may include weight loss (where appropriate) along with avoidance of the supine sleep position (if possible), or an oral appliance in appropriate candidates.   Please note that untreated obstructive sleep apnea may carry additional perioperative morbidity. Patients with significant obstructive sleep apnea should receive perioperative PAP therapy and the surgeons and particularly the anesthesiologist should be informed of the diagnosis and the severity of the sleep disordered breathing. The patient should be cautioned not to drive, work at heights, or operate dangerous or heavy equipment when tired or sleepy. Review and reiteration of good sleep hygiene measures should be pursued with any patient. Other causes of the patient's symptoms, including circadian rhythm disturbances, an underlying mood disorder, medication effect and/or an underlying medical problem cannot be ruled out based on this test. Clinical correlation is recommended.  The patient and her referring provider will be notified of the test results. The patient will be seen in follow up in sleep clinic at [facility], as necessary.  I certify that I have reviewed the raw data recording prior to the issuance of this report in accordance with the standards of the American Academy of Sleep Medicine (AASM).  INTERPRETING PHYSICIAN:   Star Age, MD, PhD  Board Certified in Neurology and Sleep Medicine  [facility] [address] [place],  [address] [phone]

## 2022-01-23 ENCOUNTER — Telehealth: Payer: Self-pay | Admitting: *Deleted

## 2022-01-23 ENCOUNTER — Encounter: Payer: Self-pay | Admitting: *Deleted

## 2022-01-23 NOTE — Telephone Encounter (Signed)
I spoke with the patient's daughter Gina Key (on [place]) with the patient listening in the background.  We discussed the EEG results which were normal and then discussed the home sleep test results.  The patient is amenable to proceeding with the start of the process for AutoPap.  They were concerned about cost/Medicare coverage but I assured that this could be discussed with the DME company before commitment.  The patient's daughter did not have a preference for DME company.  Will proceed with [facility].  Discussed insurance compliance requirements which includes using the machine at least 4 hours at night and also being seen for an initial follow-up appointment between 30 and 90 days after set up.  Patient was scheduled for an appointment with Dr. Rexene Alberts on Tuesday, September 19th at 1:15 PM arrival 15-30 minutes early with machine & power cord. She was advised to call [facility] if she does not hear within a few days. Also discussed that a follow-up letter would be sent. Will mail to home per daughter's preference. Pt's daughter's questions were answered and she verbalized appreciation for the call.  Order, sleep study, insurance info, and office note faxed to [place]. Received a receipt of confirmation. Letter mailed to address on file. Results routed to referring provider.

## 2022-02-13 ENCOUNTER — Ambulatory Visit (INDEPENDENT_AMBULATORY_CARE_PROVIDER_SITE_OTHER): Payer: Medicare HMO | Admitting: Family Medicine

## 2022-02-13 ENCOUNTER — Ambulatory Visit (INDEPENDENT_AMBULATORY_CARE_PROVIDER_SITE_OTHER): Payer: Medicare HMO

## 2022-02-13 VITALS — BP 122/62 | HR 68 | Temp 97.6°F | Ht 62.0 in | Wt 199.4 lb

## 2022-02-13 VITALS — BP 122/63 | HR 68 | Temp 97.6°F | Ht 62.0 in | Wt 199.4 lb

## 2022-02-13 DIAGNOSIS — I10 Essential (primary) hypertension: Secondary | ICD-10-CM

## 2022-02-13 DIAGNOSIS — R635 Abnormal weight gain: Secondary | ICD-10-CM | POA: Diagnosis not present

## 2022-02-13 DIAGNOSIS — R569 Unspecified convulsions: Secondary | ICD-10-CM | POA: Diagnosis not present

## 2022-02-13 DIAGNOSIS — K219 Gastro-esophageal reflux disease without esophagitis: Secondary | ICD-10-CM

## 2022-02-13 DIAGNOSIS — Z0000 Encounter for general adult medical examination without abnormal findings: Secondary | ICD-10-CM

## 2022-02-13 NOTE — Progress Notes (Signed)
Subjective:   Gina Key is a 76 y.o. female who presents for Medicare Annual (Subsequent) preventive examination.  Review of Systems         Objective:    Today's Vitals   02/13/22 1539  BP: 122/62  Pulse: 68  Temp: 97.6 F (36.4 C)  TempSrc: Oral  SpO2: 99%  Weight: 199 lb 6.4 oz (90.4 kg)  Height: '5\' 2"'$  (1.575 m)   Body mass index is 36.47 kg/m.     02/13/2022    4:05 PM 12/11/2021    9:48 AM  Advanced Directives  Does Patient Have a Medical Advance Directive? No No  Would patient like information on creating a medical advance directive? No - Patient declined     Current Medications (verified) Outpatient Encounter Medications as of 02/13/2022  Medication Sig   aspirin 325 MG EC tablet Take 325 mg by mouth daily.   atorvastatin (LIPITOR) 40 MG tablet Take 1 tablet (40 mg total) by mouth daily.   fluticasone (FLONASE) 50 MCG/ACT nasal spray Place into the nose.   Incontinence Supply Disposable (WINGS HL ADULT BRIEFS/XL) MISC As needed daily.   levETIRAcetam (KEPPRA) 500 MG tablet Take 1 tablet (500 mg total) by mouth 2 (two) times daily.   losartan (COZAAR) 100 MG tablet Take 1 tablet (100 mg total) by mouth daily.   metoprolol succinate (TOPROL-XL) 25 MG 24 hr tablet Take 1 tablet (25 mg total) by mouth daily.   montelukast (SINGULAIR) 10 MG tablet Take 1 tablet (10 mg total) by mouth at bedtime.   potassium chloride (KLOR-CON) 10 MEQ tablet Take 10 mEq by mouth daily.   spironolactone (ALDACTONE) 25 MG tablet Take 1 tablet (25 mg total) by mouth daily.   topiramate (TOPAMAX) 100 MG tablet Take 1 tablet (100 mg total) by mouth 2 (two) times daily.   No facility-administered encounter medications on file as of 02/13/2022.    Allergies (verified) Patient has no known allergies.   History: Past Medical History:  Diagnosis Date   Acid reflux    High cholesterol    Hypertension    Past Surgical History:  Procedure Laterality Date   CHOLECYSTECTOMY      left and right rotator cuff     Family History  Problem Relation Age of Onset   Pancreatic cancer Mother    Thyroid cancer Sister    Seizures Paternal Grandmother    Kidney cancer Daughter    Sleep apnea Daughter    Sleep apnea Son    Stroke Son    Seizures Son    Sleep apnea Son    Seizures Grandson    Social History   Socioeconomic History   Marital status: Widowed    Spouse name: Not on file   Number of children: 6   Years of education: 23   Highest education level: Not on file  Occupational History   Not on file  Tobacco Use   Smoking status: Never   Smokeless tobacco: Never  Vaping Use   Vaping Use: Never used  Substance and Sexual Activity   Alcohol use: Never   Drug use: Never   Sexual activity: Not on file  Other Topics Concern   Not on file  Social History Narrative   Lives at home with daughter   Right handed   Caffeine: none    Social Determinants of Health   Financial Resource Strain: Low Risk  (02/13/2022)   Overall Financial Resource Strain (CARDIA)    Difficulty of  Paying Living Expenses: Not hard at all  Food Insecurity: No Food Insecurity (02/13/2022)   Hunger Vital Sign    Worried About Running Out of Food in the Last Year: Never true    Ran Out of Food in the Last Year: Never true  Transportation Needs: No Transportation Needs (02/13/2022)   PRAPARE - Hydrologist (Medical): No    Lack of Transportation (Non-Medical): No  Physical Activity: Inactive (02/13/2022)   Exercise Vital Sign    Days of Exercise per Week: 0 days    Minutes of Exercise per Session: 0 min  Stress: No Stress Concern Present (02/13/2022)   [place]    Feeling of Stress : Not at all  Social Connections: Moderately Integrated (02/13/2022)   Social Connection and Isolation Panel [NHANES]    Frequency of Communication with Friends and Family: More than three times a week     Frequency of Social Gatherings with Friends and Family: More than three times a week    Attends Religious Services: More than 4 times per year    Active Member of [employer] or Organizations: Not on file    Attends Archivist Meetings: More than 4 times per year    Marital Status: Widowed    Tobacco Counseling Counseling given: Not Answered   Clinical Intake: How often do you need to have someone help you when you read instructions, pamphlets, or other written materials from your doctor or pharmacy?: 1 - Never  Diabetic?  No  Interpreter Needed?: No Activities of Daily Living    02/13/2022    3:57 PM  In your present state of health, do you have any difficulty performing the following activities:  Hearing? 0  Vision? 0  Difficulty concentrating or making decisions? 0  Walking or climbing stairs? 0  Comment Uses a cane  Dressing or bathing? 0  Doing errands, shopping? 0  Comment Daughter assist  Preparing Food and eating ? N  Using the Toilet? N  In the past six months, have you accidently leaked urine? N  Do you have problems with loss of bowel control? Y  Comment Wears breifs. Followed by PCP  Managing your Medications? N  Managing your Finances? N  Housekeeping or managing your Housekeeping? N    Patient Care Team: Billie Ruddy, MD as PCP - General (Family Medicine)  Indicate any recent Medical Services you may have received from other than [facility] providers in the past year (date may be approximate).     Assessment:   This is a routine wellness examination for [place].  Hearing/Vision screen Hearing Screening - Comments:: No hearing difficulty Vision Screening - Comments:: Wears glasses. Followed by [facility]  Dietary issues and exercise activities discussed: Exercise limited by: None identified   Goals Addressed               This Visit's Progress     No current goals (pt-stated)         Depression Screen    02/13/2022    3:54 PM  01/05/2022    3:06 PM 10/03/2021   10:13 AM  PHQ 2/9 Scores  PHQ - 2 Score 0 0 0  PHQ- 9 Score 0 0 2    Fall Risk    02/13/2022    4:03 PM 01/05/2022    3:00 PM 10/03/2021   10:12 AM  Fall Risk   Falls in the past year?  $'1 1 1  'W$ Number falls in past yr: 0 0 0  Injury with Fall? 0 0 0  Comment No injury or medical attention needed    Risk for fall due to : No Fall Risks Other (Comment)   Follow up  Falls prevention discussed     [place]:  Any stairs in or around the home? Yes  If so, are there any without handrails? No  Home free of loose throw rugs in walkways, pet beds, electrical cords, etc? Yes  Adequate lighting in your home to reduce risk of falls? Yes   ASSISTIVE DEVICES UTILIZED TO PREVENT FALLS:  Life alert? No  Use of a cane, walker or w/c? Yes  Grab bars in the bathroom? No  Shower chair or bench in shower? No  Elevated toilet seat or a handicapped toilet? No   TIMED UP AND GO:  Was the test performed? Yes .  Length of time to ambulate 10 feet: 10 sec.   Gait slow and steady with assistive device  Cognitive Function:   Immunizations Immunization History  Administered Date(s) Administered   Influenza-Unspecified 05/02/2021   Moderna Sars-Covid-2 Vaccination 08/14/2019, 09/19/2019, 05/07/2020, 05/02/2021    Flu Vaccine status: Up to date  Screening Tests Health Maintenance  Topic Date Due   DEXA SCAN  Never done   Pneumonia Vaccine 15+ Years old (1 - PCV) 07/07/2022 (Originally 11/01/2011)   COLONOSCOPY (Pts 45-57yr Insurance coverage will need to be confirmed)  07/07/2022 (Originally 11/01/1991)   TETANUS/TDAP  07/07/2022 (Originally 10/31/1965)   COVID-19 Vaccine (5 - Moderna series) 07/07/2022 (Originally 06/27/2021)   Zoster Vaccines- Shingrix (1 of 2) 07/07/2022 (Originally 10/31/1996)   INFLUENZA VACCINE  02/21/2022   Hepatitis C Screening  Completed   HPV VACCINES  Aged Out    Health Maintenance  Health  Maintenance Due  Topic Date Due   DEXA SCAN  Never done    Colorectal cancer screening: No longer required.   Mammogram status: No longer required due to Age.  Bone Density status: Ordered Scheduled 04/05/22. Pt provided with contact info and advised to call to schedule appt.  Lung Cancer Screening: (Low Dose CT Chest recommended if Age 75-80years, 30 pack-year currently smoking OR have quit w/in 15years.) does not qualify.     Additional Screening:  Hepatitis C Screening: does qualify; Completed 10/03/21  Vision Screening: Recommended annual ophthalmology exams for early detection of glaucoma and other disorders of the eye. Is the patient up to date with their annual eye exam?  Yes  Who is the provider or what is the name of the office in which the patient attends annual eye exams? [facility] If pt is not established with a provider, would they like to be referred to a provider to establish care? No .   Dental Screening: Recommended annual dental exams for proper oral hygiene  Community Resource Referral / Chronic Care Management:  CRR required this visit?  No   CCM required this visit?  No      Plan:     I have personally reviewed and noted the following in the patient's chart:   Medical and social history Use of alcohol, tobacco or illicit drugs  Current medications and supplements including opioid prescriptions.  Functional ability and status Nutritional status Physical activity Advanced directives List of other physicians Hospitalizations, surgeries, and ER visits in previous 12 months Vitals Screenings to include cognitive, depression, and falls Referrals and appointments  In  addition, I have reviewed and discussed with patient certain preventive protocols, quality metrics, and best practice recommendations. A written personalized care plan for preventive services as well as general preventive health recommendations were provided to patient.     Criselda Peaches, LPN   6/[id number]/[id number]   Nurse Notes: None

## 2022-02-13 NOTE — Progress Notes (Signed)
Subjective:    Patient ID: Gina Key, female    DOB: 10/24/1945, 76 y.o.   MRN: [id number]  Chief Complaint  Patient presents with   Follow-up  Pt accompanied by her daughter.  HPI Patient was seen today for f/u.  Pt feels like seizure med Keppra is causing wt gain.  Pt was 193 lbs 12/13/21, now 199.  Pt has Neurology appt in several wks.  Notes increased acid reflux sx, was on med, unsure of name.  Diet coke makes sx worse.  Previously seen by GI in [place], [place] [phone], but moved to [place] before she was to have EGD/colonoscopy.  Sleep study done 01/17/2022 with mild OSA.  Pt interest when/why she was on aspirin 325 mg daily.  Past Medical History:  Diagnosis Date   Acid reflux    High cholesterol    Hypertension     No Known Allergies  ROS General: Denies fever, chills, night sweats, changes in appetite  +changes in wt HEENT: Denies headaches, ear pain, changes in vision, rhinorrhea, sore throat CV: Denies CP, palpitations, SOB, orthopnea Pulm: Denies SOB, cough, wheezing GI: Denies abdominal pain, nausea, vomiting, diarrhea, constipation  +acid reflux GU: Denies dysuria, hematuria, frequency, vaginal discharge Msk: Denies muscle cramps, joint pains Neuro: Denies weakness, numbness, tingling Skin: Denies rashes, bruising Psych: Denies depression, anxiety, hallucinations     Objective:    Blood pressure 122/63, pulse 68, temperature 97.6 F (36.4 C), temperature source Oral, height '5\' 2"'$  (1.575 m), weight 199 lb 6.4 oz (90.4 kg), SpO2 99 %.  Gen. Pleasant, well-nourished, in no distress, normal affect   HEENT: [place]/AT, face symmetric, conjunctiva clear, no scleral icterus, PERRLA, EOMI, nares patent without drainage Lungs: no accessory muscle use, CTAB, no wheezes or rales Cardiovascular: RRR, no m/r/g, no peripheral edema Abdomen: BS present, soft, NT/ND Neuro:  A&Ox3, CN II-XII intact, normal gait Skin:  Warm, no lesions/ rash   Wt Readings from Last 3  Encounters:  02/13/22 199 lb 6.4 oz (90.4 kg)  01/05/22 196 lb 12.8 oz (89.3 kg)  12/13/21 193 lb 12.8 oz (87.9 kg)    Lab Results  Component Value Date   WBC 9.3 12/11/2021   HGB 11.8 (L) 12/11/2021   HCT 37.4 12/11/2021   PLT 200 12/11/2021   GLUCOSE 107 (H) 12/11/2021   CHOL 153 10/03/2021   TRIG 88.0 10/03/2021   HDL 56.40 10/03/2021   LDLCALC 79 10/03/2021   ALT 18 12/11/2021   AST 26 12/11/2021   NA 141 12/11/2021   K 3.6 12/11/2021   CL 116 (H) 12/11/2021   CREATININE 0.94 12/11/2021   BUN 11 12/11/2021   CO2 21 (L) 12/11/2021   TSH 0.84 10/03/2021   HGBA1C 5.8 10/03/2021    Assessment/Plan:  Weight gain -6 pound weight gain noted over the last 2 months -Possibly related to [place] as medication was started around time of weight gain. -Patient advised to keep food diary -Patient advised to contact neurology office regarding medication concern -Continue to monitor  Seizure-like activity ([place]) -Question of seizure like activity on 12/11/2021 and EEG and sleep study 01/17/2022 with mild OSA. -Continue Keppra 500 mg twice daily -Patient advised to continue follow-up with neurology as concerned Keppra causing weight gain  Gastroesophageal reflux disease, unspecified whether esophagitis present  -avoid foods known to cause symptoms -Given increase in GERD symptoms will have patient stop ASA 325 mg daily.  Advised if wanted to continue aspirin consider enteric-coated ASA 81 mg -Discussed obtaining GI records  from [place], [place] -Protonix 20 mg daily -Follow-up with GI locally.  Referral placed. - Plan: Ambulatory referral to Gastroenterology, pantoprazole (PROTONIX) 20 MG tablet  Essential hypertension -controlled -continue current meds including spironolactone 25 mg daily, Toprol-XL 25 mg daily, losartan 100 mg daily -Discussed stopping ASA 325 mg daily.  Can take enteric-coated ASA 81 mg if desired. -Lifestyle modifications  F/u prn  Grier Mitts, MD

## 2022-02-13 NOTE — Patient Instructions (Addendum)
Gina Key , Thank you for taking time to come for your Medicare Wellness Visit. I appreciate your ongoing commitment to your health goals. Please review the following plan we discussed and let me know if I can assist you in the future.   These are the goals we discussed:  Goals       No current goals (pt-stated)        This is a list of the screening recommended for you and due dates:  Health Maintenance  Topic Date Due   DEXA scan (bone density measurement)  Never done   Pneumonia Vaccine (1 - PCV) 07/07/2022*   Colon Cancer Screening  07/07/2022*   Tetanus Vaccine  07/07/2022*   COVID-19 Vaccine (5 - Moderna series) 07/07/2022*   Zoster (Shingles) Vaccine (1 of 2) 07/07/2022*   Flu Shot  02/21/2022   Hepatitis C Screening: USPSTF Recommendation to screen - Ages 18-79 yo.  Completed   HPV Vaccine  Aged Out  *Topic was postponed. The date shown is not the original due date.    Advanced directives: No  Conditions/risks identified: None  Next appointment: Follow up in one year for your annual wellness visit     Preventive Care 65 Years and Older, Female Preventive care refers to lifestyle choices and visits with your health care provider that can promote health and wellness. What does preventive care include? A yearly physical exam. This is also called an annual well check. Dental exams once or twice a year. Routine eye exams. Ask your health care provider how often you should have your eyes checked. Personal lifestyle choices, including: Daily care of your teeth and gums. Regular physical activity. Eating a healthy diet. Avoiding tobacco and drug use. Limiting alcohol use. Practicing safe sex. Taking low-dose aspirin every day. Taking vitamin and mineral supplements as recommended by your health care provider. What happens during an annual well check? The services and screenings done by your health care provider during your annual well check will depend on your age,  overall health, lifestyle risk factors, and family history of disease. Counseling  Your health care provider may ask you questions about your: Alcohol use. Tobacco use. Drug use. Emotional well-being. Home and relationship well-being. Sexual activity. Eating habits. History of falls. Memory and ability to understand (cognition). Work and work Statistician. Reproductive health. Screening  You may have the following tests or measurements: Height, weight, and BMI. Blood pressure. Lipid and cholesterol levels. These may be checked every 5 years, or more frequently if you are over 38 years old. Skin check. Lung cancer screening. You may have this screening every year starting at age 46 if you have a 30-pack-year history of smoking and currently smoke or have quit within the past 15 years. Fecal occult blood test (FOBT) of the stool. starting at age 75. You may have this test every year starting at age 75. Flexible sigmoidoscopy or colonoscopy. You may have a sigmoidoscopy every 5 years or a colonoscopy every 10 years starting at age 75. Hepatitis C blood test. Hepatitis B blood test. Sexually transmitted disease (STD) testing. Diabetes screening. This is done by checking your blood sugar (glucose) after you have not eaten for a while (fasting). You may have this done every 1-3 years. Bone density scan. This is done to screen for osteoporosis. You may have this done starting at age 75. Mammogram. This may be done every 1-2 years. Talk to your health care provider about how often you should have regular mammograms. Talk with your health care  provider about your test results, treatment options, and if necessary, the need for more tests. Vaccines  Your health care provider may recommend certain vaccines, such as: Influenza vaccine. This is recommended every year. Tetanus, diphtheria, and acellular pertussis (Tdap, Td) vaccine. You may need a Td booster every 10 years. Zoster vaccine. You may need this after age  75. Pneumococcal 13-valent conjugate (PCV13) vaccine. One dose is recommended after age 75. Pneumococcal polysaccharide (PPSV23) vaccine. One dose is recommended after age 75. Talk to your health care provider about which screenings and vaccines you need and how often you need them. This information is not intended to replace advice given to you by your health care provider. Make sure you discuss any questions you have with your health care provider. Document Released: 08/06/2015 Document Revised: 03/29/2016 Document Reviewed: 05/11/2015 Elsevier Interactive Patient Education  2017 [place] Prevention in the Home Falls can cause injuries. They can happen to people of all ages. There are many things you can do to make your home safe and to help prevent falls. What can I do on the outside of my home? Regularly fix the edges of walkways and driveways and fix any cracks. Remove anything that might make you trip as you walk through a door, such as a raised step or threshold. Trim any bushes or trees on the path to your home. Use bright outdoor lighting. Clear any walking paths of anything that might make someone trip, such as rocks or tools. Regularly check to see if handrails are loose or broken. Make sure that both sides of any steps have handrails. Any raised decks and porches should have guardrails on the edges. Have any leaves, snow, or ice cleared regularly. Use sand or salt on walking paths during winter. Clean up any spills in your garage right away. This includes oil or grease spills. What can I do in the bathroom? Use night lights. Install grab bars by the toilet and in the tub and shower. Do not use towel bars as grab bars. Use non-skid mats or decals in the tub or shower. If you need to sit down in the shower, use a plastic, non-slip stool. Keep the floor dry. Clean up any water that spills on the floor as soon as it happens. Remove soap buildup in the tub or shower  regularly. Attach bath mats securely with double-sided non-slip rug tape. Do not have throw rugs and other things on the floor that can make you trip. What can I do in the bedroom? Use night lights. Make sure that you have a light by your bed that is easy to reach. Do not use any sheets or blankets that are too big for your bed. They should not hang down onto the floor. Have a firm chair that has side arms. You can use this for support while you get dressed. Do not have throw rugs and other things on the floor that can make you trip. What can I do in the kitchen? Clean up any spills right away. Avoid walking on wet floors. Keep items that you use a lot in easy-to-reach places. If you need to reach something above you, use a strong step stool that has a grab bar. Keep electrical cords out of the way. Do not use floor polish or wax that makes floors slippery. If you must use wax, use non-skid floor wax. Do not have throw rugs and other things on the floor that can make you trip. What can I  do with my stairs? Do not leave any items on the stairs. Make sure that there are handrails on both sides of the stairs and use them. Fix handrails that are broken or loose. Make sure that handrails are as long as the stairways. Check any carpeting to make sure that it is firmly attached to the stairs. Fix any carpet that is loose or worn. Avoid having throw rugs at the top or bottom of the stairs. If you do have throw rugs, attach them to the floor with carpet tape. Make sure that you have a light switch at the top of the stairs and the bottom of the stairs. If you do not have them, ask someone to add them for you. What else can I do to help prevent falls? Wear shoes that: Do not have high heels. Have rubber bottoms. Are comfortable and fit you well. Are closed at the toe. Do not wear sandals. If you use a stepladder: Make sure that it is fully opened. Do not climb a closed stepladder. Make sure that  both sides of the stepladder are locked into place. Ask someone to hold it for you, if possible. Clearly mark and make sure that you can see: Any grab bars or handrails. First and last steps. Where the edge of each step is. Use tools that help you move around (mobility aids) if they are needed. These include: Canes. Walkers. Scooters. Crutches. Turn on the lights when you go into a dark area. Replace any light bulbs as soon as they burn out. Set up your furniture so you have a clear path. Avoid moving your furniture around. If any of your floors are uneven, fix them. If there are any pets around you, be aware of where they are. Review your medicines with your doctor. Some medicines can make you feel dizzy. This can increase your chance of falling. Ask your doctor what other things that you can do to help prevent falls. This information is not intended to replace advice given to you by your health care provider. Make sure you discuss any questions you have with your health care provider. Document Released: 05/06/2009 Document Revised: 12/16/2015 Document Reviewed: 08/14/2014 Elsevier Interactive Patient Education  2017 [employer].

## 2022-02-15 ENCOUNTER — Telehealth: Payer: Self-pay | Admitting: Family Medicine

## 2022-02-15 NOTE — Telephone Encounter (Signed)
Pt daughter is calling and still waiting on new rx for acid reflux [id number] [address] [place]:  [phone] Fax:  [phone]

## 2022-02-19 ENCOUNTER — Encounter: Payer: Self-pay | Admitting: Family Medicine

## 2022-02-19 MED ORDER — PANTOPRAZOLE SODIUM 20 MG PO TBEC: 20.0000 mg | DELAYED_RELEASE_TABLET | Freq: Every day | ORAL | 1 refills | Status: DC

## 2022-02-23 DIAGNOSIS — G4733 Obstructive sleep apnea (adult) (pediatric): Secondary | ICD-10-CM | POA: Diagnosis not present

## 2022-02-23 NOTE — Telephone Encounter (Signed)
Rx was sent in for protonix.

## 2022-03-13 ENCOUNTER — Telehealth: Payer: Self-pay | Admitting: Neurology

## 2022-03-13 MED ORDER — LEVETIRACETAM 500 MG PO TABS: 500.0000 mg | ORAL_TABLET | Freq: Two times a day (BID) | ORAL | 0 refills | Status: DC

## 2022-03-13 NOTE — Telephone Encounter (Signed)
Pt request refill forlevETIRAcetam (KEPPRA) 500 MG tablet at [id number]  Pt's daughter, BCarol Adasaid pt has been out to medication for two days. Would like a call back when prescription  has been sent.

## 2022-03-13 NOTE — Telephone Encounter (Signed)
[facility] [address]. Pt has an appt next month. I called Ms Gina Key and let her know. She was appreciative.

## 2022-03-21 ENCOUNTER — Other Ambulatory Visit: Payer: Self-pay | Admitting: Family Medicine

## 2022-03-21 DIAGNOSIS — K219 Gastro-esophageal reflux disease without esophagitis: Secondary | ICD-10-CM

## 2022-03-24 ENCOUNTER — Other Ambulatory Visit: Payer: Medicare HMO

## 2022-03-26 DIAGNOSIS — G4733 Obstructive sleep apnea (adult) (pediatric): Secondary | ICD-10-CM | POA: Diagnosis not present

## 2022-04-04 ENCOUNTER — Other Ambulatory Visit: Payer: Self-pay | Admitting: Neurology

## 2022-04-05 ENCOUNTER — Ambulatory Visit
Admission: RE | Admit: 2022-04-05 | Discharge: 2022-04-05 | Disposition: A | Discharge status: Home / Self Care | Payer: Medicare HMO | Source: Ambulatory Visit | Attending: Family Medicine | Admitting: Family Medicine

## 2022-04-05 ENCOUNTER — Other Ambulatory Visit: Payer: Self-pay | Admitting: Family Medicine

## 2022-04-05 DIAGNOSIS — Z780 Asymptomatic menopausal state: Secondary | ICD-10-CM | POA: Diagnosis not present

## 2022-04-05 DIAGNOSIS — Z13820 Encounter for screening for osteoporosis: Secondary | ICD-10-CM

## 2022-04-05 DIAGNOSIS — M85852 Other specified disorders of bone density and structure, left thigh: Secondary | ICD-10-CM | POA: Diagnosis not present

## 2022-04-05 DIAGNOSIS — Z1231 Encounter for screening mammogram for malignant neoplasm of breast: Secondary | ICD-10-CM

## 2022-04-11 ENCOUNTER — Ambulatory Visit (INDEPENDENT_AMBULATORY_CARE_PROVIDER_SITE_OTHER): Payer: Medicare HMO | Admitting: Neurology

## 2022-04-11 ENCOUNTER — Encounter: Payer: Self-pay | Admitting: Neurology

## 2022-04-11 VITALS — BP 158/80 | HR 60 | Ht 62.0 in | Wt 203.4 lb

## 2022-04-11 DIAGNOSIS — R569 Unspecified convulsions: Secondary | ICD-10-CM

## 2022-04-11 DIAGNOSIS — G4733 Obstructive sleep apnea (adult) (pediatric): Secondary | ICD-10-CM

## 2022-04-11 DIAGNOSIS — Z9989 Dependence on other enabling machines and devices: Secondary | ICD-10-CM | POA: Diagnosis not present

## 2022-04-11 NOTE — Progress Notes (Signed)
Subjective:    Patient ID: Gina Key is a 76 y.o. female.  HPI    Interim history:     Gina Key is a 76 year old right-handed woman with an underlying medical history of reflux disease, allergies, history of migraines, history of seizure (by chart review), lipidemia, arthritis, hypertension, and obesity, who presents for follow-up consultation of her sleep apnea after interim testing and starting AutoPap therapy.  Patient is accompanied by her daughter again today.  I first met her at the request of her primary care physician on 12/05/2021, at which time she reported a prior diagnosis of obstructive sleep apnea.  She was no longer on positive airway pressure treatment.  She was advised to proceed with a sleep study.  I saw her for a new problem visit on 12/13/2021 for an episode of unresponsiveness.  She had presented to the emergency room and had been started on Keppra.  We proceeded with an EEG.  She had an EEG on 12/15/2021 and I reviewed the results:  Impression: This is a normal EEG recording in the waking and drowsy state. No evidence of interictal epileptiform discharges seen. A normal EEG does not exclude a diagnosis of epilepsy.    She was notified via MyChart message.  She had a home sleep test on 01/17/2022 which indicated overall mild obstructive sleep apnea with a total AHI of 8.2/hour and O2 nadir of 87%. Snoring was detected, and appeared to be mild to moderate, intermittent.  She was advised to proceed with AutoPap therapy.  Her set up date was 02/23/2022.  She has a ResMed AirSense 11 AutoSet machine.  Today, 04/11/2022: I reviewed her AutoPap compliance data from 03/11/2022 through 04/09/2022, which is a total of 30 days, during which time she used her machine 29 days with percent use days greater than 4 hours at 80%, indicating excellent compliance with an average usage of 5 hours and 17 minutes, residual AHI at goal at 0.6/h, average pressure for the 95th percentile at 8 cm  with a range of 5 to 11 cm with EPR.  Leak rather low with the 95th percentile at 0.8 L/min.  She reports doing fairly well with the machine, she is certainly motivated to continue with treatment and is compliant with it but has had some trouble lately with the display of the machine and with the comfort of the mask.  She is using a medium Siesta 3B fullface mask, she also has a small insert but was told that the small is too small for her.  But the looks of it, the small seems to be a better size for her from my standpoint but she does have an appointment to discuss mask and machine issues with her DME provider.  She also has had instances of water in the tubing which was uncomfortable as it rose into the mask.  She has had recent stress, her son has been hospitalized for unresponsiveness and seizure as I understand.  Understandably, she is worried about him.  She has not had any further episodes of unresponsiveness or seizure-like activity herself.  She continues to take Keppra 500 mg twice daily.  She does not always hydrate well with water, per daughter.  Patient estimates that she drinks about 2 bottles of water per day.  She likes to drink V8 energy drink, 1 can/day and occasional soda.  Her daytime energy has improved but she does doze off sometimes according to the daughter.   Previously:   12/13/21: (  She) presents for a new problem visit for a suspected seizure.  She is referred by the emergency room.  I recently saw her on 12/05/2021 at the request of her primary care physician for evaluation of her sleep apnea.  The patient is accompanied by her daughter today.  She presented to the emergency room on 12/11/2021 via EMS.  She she was found lying on the floor on her side, she appeared confused, no convulsion or twitching was seen.  No evidence of tongue bite or urinary incontinence or bowel incontinence.  I reviewed the emergency room records from 12/11/2021.  She had a CT head without contrast and  cervical spine CT without contrast on 12/11/2021 and I reviewed the results: IMPRESSION: 1. No evidence of acute intracranial abnormality. 2. No static evidence of acute injury to the cervical spine. Degenerative changes as described.   She has been given IV Keppra 2 g and and started on oral Keppra 500 mg twice daily.  Laboratory testing showed sodium of 141, potassium on the lower end at 3.6, chloride 116, slightly elevated, glucose 107, BUN 11, creatinine 0.94, normal AST and ALT at 26 and 18, respectively.  CBC with differential and platelet benign with the exception of borderline low hemoglobin at 11.8.  Magnesium was normal at 2.0.   Of note, she had a brain MRI without contrast through [facility] on 03/04/2021 and I reviewed the results: Impression: 1. No acute intracranial process. No acute infarct.  2. Age-related cerebral atrophy and chronic small vessel white matter disease.    She had presented to the emergency room with at [facility] in [place], [place] on 03/04/2021 with a chief complaint of memory loss and having bitten her tongue in her sleep.  She reported a memory lapse.  She also reported recurrent headaches for about 3 weeks.  She had a head CT without contrast on 03/04/2021 and I reviewed the results: Impression: No acute intracranial abnormality.   She reports feeling okay, no new complaints, daughter feels that she is not back to normal yet, she seems sluggish in her thinking and confused at times.  She has not had any bruising and no injuries, daughter reports that she found her in a small space at the foot end of her bed on the floor mumbling and fidgeting with her walker.  She had not hurt herself against the foot end of the bed or sustaining any bruising.  Patient does not recall falling.  She does not recall feeling lightheaded or dizzy or nauseated.  She was getting dressed in the morning and her daughter checked on her to see what she would like  for breakfast.  Shortly before patient had a conversation with the son-in-law in the kitchen.  She has had some milder recurrent headaches.  She admits that she does not hydrate well with water, estimates that she drinks 1 or 2 bottles of water per day.  She drinks occasional soda, no coffee.  She sustained no tongue bite and no bladder or bowel incontinence.  She is on Keppra 500 mg twice daily.    12/05/21: (She) was previously diagnosed with obstructive sleep apnea and placed on positive airway pressure treatment.  She is currently no longer on a CPAP or similar machine, prior testing was several years ago and test results are not available for my review today. Testing was in [place]. I reviewed your office note from 09/28/2021.  Her Epworth sleepiness score is 0/24, fatigue  severity score is 30 out of 63.  She reports that she used her machine in the beginning, at least for a few months but then the machine broke.  She reports that she had a nightmare and she threw the machine.  She reports a family history of sleep apnea. Her sister, one son and her other daughter have OSA. She lives with [name], she has a total of 6 kids. She is widowed. She would be willing to get retested and start PAP therapy again. Her bedtime is around 11-11:30 PM and rise time is around 5:30 AM, she does not sleep through the night.  She denies recurrent nocturnal or morning headaches.  She has nocturia about once per average night.  She is working on weight loss, weight has been fluctuating.  She is a non-smoker and does not utilize alcohol and does not drink caffeine daily.  She often wakes up in the early morning hours and has trouble falling asleep but may doze off for a few more hours.     Her Past Medical History Is Significant For: Past Medical History:  Diagnosis Date   Acid reflux    High cholesterol    Hypertension     Her Past Surgical History Is Significant For: Past Surgical History:  Procedure Laterality Date    CHOLECYSTECTOMY     left and right rotator cuff      Her Family History Is Significant For: Family History  Problem Relation Age of Onset   Pancreatic cancer Mother    Thyroid cancer Sister    Seizures Paternal Grandmother    Kidney cancer Daughter    Sleep apnea Daughter    Sleep apnea Son    Stroke Son    Seizures Son    Sleep apnea Son    Seizures Grandson     Her Social History Is Significant For: Social History   Socioeconomic History   Marital status: Widowed    Spouse name: Not on file   Number of children: 6   Years of education: 17   Highest education level: Not on file  Occupational History   Not on file  Tobacco Use   Smoking status: Never   Smokeless tobacco: Never  Vaping Use   Vaping Use: Never used  Substance and Sexual Activity   Alcohol use: Never   Drug use: Never   Sexual activity: Not on file  Other Topics Concern   Not on file  Social History Narrative   Lives at home with daughter   Right handed   Caffeine: none    Social Determinants of Health   Financial Resource Strain: Low Risk  (02/13/2022)   Overall Financial Resource Strain (CARDIA)    Difficulty of Paying Living Expenses: Not hard at all  Food Insecurity: No Food Insecurity (02/13/2022)   Hunger Vital Sign    Worried About Running Out of Food in the Last Year: Never true    [place] in the Last Year: Never true  Transportation Needs: No Transportation Needs (02/13/2022)   PRAPARE - Hydrologist (Medical): No    Lack of Transportation (Non-Medical): No  Physical Activity: Inactive (02/13/2022)   Exercise Vital Sign    Days of Exercise per Week: 0 days    Minutes of Exercise per Session: 0 min  Stress: No Stress Concern Present (02/13/2022)   [place]    Feeling of Stress : Not at  all  Social Connections: Moderately Integrated (02/13/2022)   Social Connection and Isolation  Panel [NHANES]    Frequency of Communication with Friends and Family: More than three times a week    Frequency of Social Gatherings with Friends and Family: More than three times a week    Attends Religious Services: More than 4 times per year    Active Member of [employer] or Organizations: Not on file    Attends Archivist Meetings: More than 4 times per year    Marital Status: Widowed    Her Allergies Are:  No Known Allergies:   Her Current Medications Are:  Outpatient Encounter Medications as of 04/11/2022  Medication Sig   aspirin 325 MG EC tablet Take 325 mg by mouth daily.   atorvastatin (LIPITOR) 40 MG tablet Take 1 tablet (40 mg total) by mouth daily.   fluticasone (FLONASE) 50 MCG/ACT nasal spray Place into the nose.   Incontinence Supply Disposable (WINGS HL ADULT BRIEFS/XL) MISC As needed daily.   levETIRAcetam (KEPPRA) 500 MG tablet TAKE 1 TABLET BY MOUTH TWICE A DAY   losartan (COZAAR) 100 MG tablet Take 1 tablet (100 mg total) by mouth daily.   metoprolol succinate (TOPROL-XL) 25 MG 24 hr tablet Take 1 tablet (25 mg total) by mouth daily.   montelukast (SINGULAIR) 10 MG tablet Take 1 tablet (10 mg total) by mouth at bedtime.   pantoprazole (PROTONIX) 20 MG tablet Take 1 tablet (20 mg total) by mouth daily.   potassium chloride (KLOR-CON) 10 MEQ tablet Take 10 mEq by mouth daily.   spironolactone (ALDACTONE) 25 MG tablet Take 1 tablet (25 mg total) by mouth daily.   topiramate (TOPAMAX) 100 MG tablet Take 1 tablet (100 mg total) by mouth 2 (two) times daily.   No facility-administered encounter medications on file as of 04/11/2022.  :  Review of Systems:  Out of a complete 14 point review of systems, all are reviewed and negative with the exception of these symptoms as listed below:  Review of Systems  Neurological:        Pt here for CPAP f/u  Pt is having issues with CPAP machine . Pt states water in hose and cold air blowing in her face . Pt states has  appointment Thursday at [facility]    ESS:4     Objective:  Neurological Exam  Physical Exam Physical Examination:   Vitals:   04/11/22 1320  BP: (!) 158/80  Pulse: 60    General Examination: The patient is a very pleasant 76 y.o. female in no acute distress. She appears well-developed and well-nourished and very well groomed.   HEENT: Normocephalic, atraumatic, pupils are equal, round and reactive to light, corrective eyeglasses in place, tracking well-preserved, no nystagmus.  Face is symmetric with normal facial animation, speech is clear without dysarthria, hypophonia or voice tremor. Neck is supple with full range of passive and active motion. There are no carotid bruits on auscultation. Oropharynx exam reveals: mild mouth dryness, adequate dental hygiene and mild airway crowding.  Tongue protrudes centrally and palate elevates symmetrically, no evidence of tongue laceration.   Chest: Clear to auscultation without wheezing, rhonchi or crackles noted.   Heart: S1+S2+0, regular and normal without murmurs, rubs or gallops noted.    Abdomen: Soft, non-tender and non-distended.   Extremities: There is no obvious edema.      Skin: Warm and dry without trophic changes noted.    Musculoskeletal: exam reveals no obvious joint deformities.  Neurologically:  Mental status: The patient is awake, alert and oriented in all 4 spheres. Her immediate and remote memory, attention, language skills and fund of knowledge are appropriate. There is no evidence of aphasia, agnosia, apraxia or anomia. Speech is clear with normal prosody and enunciation. Thought process is linear. Mood is normal and affect is normal.  Cranial nerves II - XII are as described above under HEENT exam.  Motor exam: Normal bulk, strength and tone is noted. There is no obvious tremor. Fine motor skills and coordination: grossly intact.  Cerebellar testing: No dysmetria or intention tremor. There is no truncal or gait  ataxia.  Reflexes are 1+ in the upper extremities, trace in the knees and absent in the ankles, toes are downgoing bilaterally. Sensory exam: intact to light touch in the upper and lower extremities.  Gait, station and balance: She stands slowly and walks slowly, no walking aid.    Assessment and Plan:  In summary, Argentina Kosch is a very pleasant 76 year old female with an underlying medical history of reflux disease, allergies, history of migraines, history of seizure (by chart review), lipidemia, arthritis, hypertension, and obesity, who presents for follow-up consultation of her obstructive sleep apnea.  She had a home sleep test on 01/17/2022 which indicated mild obstructive sleep apnea with an AHI of 8.2/h, O2 nadir 87%.  She had an EEG recently which was unremarkable.  She has been on Keppra 500 mg twice daily and tolerates the medication, she had a suspected seizure-like event in May 2023.  She is compliant with her AutoPap machine and is commended for her treatment adherence.  She has had no side effects with the Keppra and is encouraged to continue with it for now.  She is advised to drink more water and stay well-hydrated and well rested.  She is encouraged to continue with full compliance with her AutoPap.  Her apnea scores and leak information indicate good results.  She does have some issues with the mask fit and is scheduled for an appointment with her DME provider this week.  She is advised to follow-up routinely in this clinic to see one of our nurse practitioners in 6 months, sooner if needed.  I answered all her questions today and the patient and her daughter were in agreement.  If she stays seizure-free and continues to do well with her AutoPap, she can hopefully be seen on a yearly basis thereafter.   I spent 35 minutes in total face-to-face time and in reviewing records during pre-charting, more than 50% of which was spent in counseling and coordination of care, reviewing test  results, reviewing medications and treatment regimen and/or in discussing or reviewing the diagnosis of OSA, the prognosis and treatment options. Pertinent laboratory and imaging test results that were available during this visit with the patient were reviewed by me and considered in my medical decision making (see chart for details).

## 2022-04-11 NOTE — Patient Instructions (Signed)
Please continue using your autoPAP regularly. While your insurance requires that you use PAP at least 4 hours each night on 70% of the nights, I recommend, that you not skip any nights and use it throughout the night if you can. Getting used to PAP and staying with the treatment long term does take time and patience and discipline. Untreated obstructive sleep apnea when it is moderate to severe can have an adverse impact on cardiovascular health and raise her risk for heart disease, arrhythmias, hypertension, congestive heart failure, stroke and diabetes. Untreated obstructive sleep apnea causes sleep disruption, nonrestorative sleep, and sleep deprivation. This can have an impact on your day to day functioning and cause daytime sleepiness and impairment of cognitive function, memory loss, mood disturbance, and problems focussing. Using PAP regularly can improve these symptoms.  Please follow up in 6 mo to see one of our nurse practitioners.

## 2022-04-14 ENCOUNTER — Other Ambulatory Visit: Payer: Self-pay | Admitting: Family Medicine

## 2022-04-14 DIAGNOSIS — K219 Gastro-esophageal reflux disease without esophagitis: Secondary | ICD-10-CM

## 2022-04-25 ENCOUNTER — Ambulatory Visit (INDEPENDENT_AMBULATORY_CARE_PROVIDER_SITE_OTHER): Payer: Medicare HMO

## 2022-04-25 DIAGNOSIS — Z23 Encounter for immunization: Secondary | ICD-10-CM | POA: Diagnosis not present

## 2022-04-25 DIAGNOSIS — G4733 Obstructive sleep apnea (adult) (pediatric): Secondary | ICD-10-CM | POA: Diagnosis not present

## 2022-05-01 ENCOUNTER — Encounter: Payer: Self-pay | Admitting: Family Medicine

## 2022-05-01 ENCOUNTER — Ambulatory Visit (INDEPENDENT_AMBULATORY_CARE_PROVIDER_SITE_OTHER): Payer: Medicare HMO | Admitting: Family Medicine

## 2022-05-01 VITALS — BP 128/80 | HR 74 | Temp 99.1°F | Resp 12 | Ht 62.0 in | Wt 202.5 lb

## 2022-05-01 DIAGNOSIS — R0981 Nasal congestion: Secondary | ICD-10-CM

## 2022-05-01 DIAGNOSIS — J302 Other seasonal allergic rhinitis: Secondary | ICD-10-CM

## 2022-05-01 MED ORDER — PREDNISONE 20 MG PO TABS: 40.0000 mg | ORAL_TABLET | Freq: Every day | ORAL | 0 refills | Status: AC

## 2022-05-01 MED ORDER — IPRATROPIUM BROMIDE 0.06 % NA SOLN: 2.0000 | Freq: Four times a day (QID) | NASAL | 2 refills | Status: DC

## 2022-05-01 NOTE — Progress Notes (Signed)
ACUTE VISIT Chief Complaint  Patient presents with   Sinus Problem    X about 2 weeks, has allergies bad this time of year.   HPI: [name] is a 76 y.o. female with medical hx significant for HLD,GERD,seizure disorder,OSA,and allergies here today with her daughter complaining of 2 weeks of "bad" nasal congestion,rhinorrhea,and post nasal drainage.  Sinus Problem This is a recurrent problem. The current episode started 1 to 4 weeks ago. The problem is unchanged. There has been no fever. She is experiencing no pain. Associated symptoms include congestion, coughing, sinus pressure and sneezing. Pertinent negatives include no chills, diaphoresis, ear pain, headaches, hoarse voice, neck pain, shortness of breath or swollen glands.  "Little" productive cough, no hemoptysis. "Scratchy" throat. Recently did fly, 04/20/22. No sick contact. She has taken cold medications.  She ascribes symptoms to allergies. Flonase nasal spray does not help.  Review of Systems  Constitutional:  Positive for fatigue. Negative for activity change, chills, diaphoresis and fever.  HENT:  Positive for congestion, postnasal drip, rhinorrhea, sinus pressure and sneezing. Negative for ear pain, hoarse voice, mouth sores and nosebleeds.   Eyes:  Negative for discharge, redness and itching.  Respiratory:  Positive for cough. Negative for shortness of breath and wheezing.   Gastrointestinal:  Negative for abdominal pain, nausea and vomiting.  Musculoskeletal:  Negative for myalgias and neck pain.  Skin:  Negative for rash.  Neurological:  Negative for syncope, weakness and headaches.  Rest see pertinent positives and negatives per HPI.  Current Outpatient Medications on File Prior to Visit  Medication Sig Dispense Refill   aspirin 325 MG EC tablet Take 325 mg by mouth daily.     atorvastatin (LIPITOR) 40 MG tablet Take 1 tablet (40 mg total) by mouth daily. 90 tablet 3   Incontinence Supply Disposable  (WINGS HL ADULT BRIEFS/XL) MISC As needed daily. 100 each 5   levETIRAcetam (KEPPRA) 500 MG tablet TAKE 1 TABLET BY MOUTH TWICE A DAY 60 tablet 3   losartan (COZAAR) 100 MG tablet Take 1 tablet (100 mg total) by mouth daily. 90 tablet 3   metoprolol succinate (TOPROL-XL) 25 MG 24 hr tablet Take 1 tablet (25 mg total) by mouth daily. 90 tablet 3   montelukast (SINGULAIR) 10 MG tablet Take 1 tablet (10 mg total) by mouth at bedtime. 90 tablet 3   pantoprazole (PROTONIX) 20 MG tablet TAKE 1 TABLET BY MOUTH EVERY DAY 30 tablet 1   potassium chloride (KLOR-CON) 10 MEQ tablet Take 10 mEq by mouth daily.     spironolactone (ALDACTONE) 25 MG tablet Take 1 tablet (25 mg total) by mouth daily. 90 tablet 3   topiramate (TOPAMAX) 100 MG tablet Take 1 tablet (100 mg total) by mouth 2 (two) times daily. 180 tablet 3   No current facility-administered medications on file prior to visit.   Past Medical History:  Diagnosis Date   Acid reflux    High cholesterol    Hypertension    No Known Allergies  Social History   Socioeconomic History   Marital status: Widowed    Spouse name: Not on file   Number of children: 6   Years of education: 10   Highest education level: Not on file  Occupational History   Not on file  Tobacco Use   Smoking status: Never   Smokeless tobacco: Never  Vaping Use   Vaping Use: Never used  Substance and Sexual Activity   Alcohol use: Never   Drug  use: Never   Sexual activity: Not on file  Other Topics Concern   Not on file  Social History Narrative   Lives at home with daughter   Right handed   Caffeine: none    Social Determinants of Health   Financial Resource Strain: Low Risk  (02/13/2022)   Overall Financial Resource Strain (CARDIA)    Difficulty of Paying Living Expenses: Not hard at all  Food Insecurity: No Food Insecurity (02/13/2022)   Hunger Vital Sign    Worried About Running Out of Food in the Last Year: Never true    Ran Out of Food in the Last  Year: Never true  Transportation Needs: No Transportation Needs (02/13/2022)   PRAPARE - Hydrologist (Medical): No    Lack of Transportation (Non-Medical): No  Physical Activity: Inactive (02/13/2022)   Exercise Vital Sign    Days of Exercise per Week: 0 days    Minutes of Exercise per Session: 0 min  Stress: No Stress Concern Present (02/13/2022)   [place]    Feeling of Stress : Not at all  Social Connections: Moderately Integrated (02/13/2022)   Social Connection and Isolation Panel [NHANES]    Frequency of Communication with Friends and Family: More than three times a week    Frequency of Social Gatherings with Friends and Family: More than three times a week    Attends Religious Services: More than 4 times per year    Active Member of [employer] or Organizations: Not on file    Attends Archivist Meetings: More than 4 times per year    Marital Status: Widowed   Vitals:   05/01/22 1216  BP: 128/80  Pulse: 74  Resp: 12  Temp: 99.1 F (37.3 C)  SpO2: 95%   Body mass index is 37.04 kg/m.  Physical Exam Vitals and nursing note reviewed.  Constitutional:      General: She is not in acute distress.    Appearance: She is well-developed and well-groomed. She is not ill-appearing.  HENT:     Head: Normocephalic and atraumatic.     Right Ear: Tympanic membrane, ear canal and external ear normal.     Left Ear: Tympanic membrane, ear canal and external ear normal.     Nose: Congestion and rhinorrhea present.     Right Sinus: No maxillary sinus tenderness or frontal sinus tenderness.     Left Sinus: No maxillary sinus tenderness or frontal sinus tenderness.     Comments: Mouth breathing.    Mouth/Throat:     Mouth: Mucous membranes are moist.     Pharynx: Oropharynx is clear.  Eyes:     Conjunctiva/sclera: Conjunctivae normal.  Cardiovascular:     Rate and Rhythm: Normal rate  and regular rhythm.     Heart sounds: No murmur heard. Pulmonary:     Effort: Pulmonary effort is normal. No respiratory distress.     Breath sounds: Normal breath sounds. No stridor.  Musculoskeletal:     Cervical back: No muscular tenderness.  Lymphadenopathy:     Head:     Right side of head: No submandibular adenopathy.     Left side of head: No submandibular adenopathy.     Cervical: No cervical adenopathy.  Skin:    General: Skin is warm.     Findings: No erythema or rash.  Neurological:     Mental Status: She is alert and oriented to person, place,  and time.  Psychiatric:        Mood and Affect: Mood and affect normal.   ASSESSMENT AND PLAN:  [name] was seen today for sinus problem.  Diagnoses and all orders for this visit:  Nasal sinus congestion We discussed possible etiologies. I do not think she has a bacterial sinusitis at this time, so abx is not recommended. Prednisone side effects discussed, she agrees with short course, instructed to take it with breakfast. Plain Mucinex may also help. Monitor for new symptoms, including sinus pain,headache,and fever.  -     predniSONE (DELTASONE) 20 MG tablet; Take 2 tablets (40 mg total) by mouth daily with breakfast for 3 days.  Seasonal allergies Recommend holding on starting OTC antihistaminic for now. Flonase nasal spray has not helped in the past. Recommend Atrovent nasal spray tid prn. Nasal saline irrigations as needed.  -     ipratropium (ATROVENT) 0.06 % nasal spray; Place 2 sprays into both nostrils 4 (four) times daily.  Return if symptoms worsen or fail to improve.  Adryanna Friedt G. [place], MD  [facility]. [place] office.

## 2022-05-01 NOTE — Patient Instructions (Addendum)
A few things to remember from today's visit:  Seasonal allergies - Plan: ipratropium (ATROVENT) 0.06 % nasal spray  Nasal sinus congestion - Plan: predniSONE (DELTASONE) 20 MG tablet  I do not think it is an infection. Nasal saline irrigations. Atrovent nasal spray 2-3 times per day. Take Prednisone with breakfast for 3 days.  Monitor for new symptoms.  If you need refills for medications you take chronically, please call your pharmacy. Do not use My Chart to request refills or for acute issues that need immediate attention. If you send a my chart message, it may take a few days to be addressed, specially if I am not in the office.  Please be sure medication list is accurate. If a new problem present, please set up appointment sooner than planned today.

## 2022-05-08 ENCOUNTER — Other Ambulatory Visit: Payer: Self-pay | Admitting: Family Medicine

## 2022-05-08 DIAGNOSIS — K219 Gastro-esophageal reflux disease without esophagitis: Secondary | ICD-10-CM

## 2022-05-11 ENCOUNTER — Ambulatory Visit
Admission: RE | Admit: 2022-05-11 | Discharge: 2022-05-11 | Disposition: A | Discharge status: Home / Self Care | Payer: Medicare HMO | Source: Ambulatory Visit | Attending: Family Medicine | Admitting: Family Medicine

## 2022-05-11 DIAGNOSIS — Z1231 Encounter for screening mammogram for malignant neoplasm of breast: Secondary | ICD-10-CM | POA: Diagnosis not present

## 2022-05-15 ENCOUNTER — Encounter: Payer: Self-pay | Admitting: Gastroenterology

## 2022-05-26 DIAGNOSIS — G4733 Obstructive sleep apnea (adult) (pediatric): Secondary | ICD-10-CM | POA: Diagnosis not present

## 2022-06-21 ENCOUNTER — Encounter: Payer: Self-pay | Admitting: Gastroenterology

## 2022-06-21 ENCOUNTER — Ambulatory Visit: Payer: Medicare HMO | Admitting: Gastroenterology

## 2022-06-21 VITALS — BP 130/76 | HR 76 | Ht 62.0 in | Wt 205.5 lb

## 2022-06-21 DIAGNOSIS — K219 Gastro-esophageal reflux disease without esophagitis: Secondary | ICD-10-CM

## 2022-06-21 NOTE — Patient Instructions (Addendum)
If you are age 75 or older, your body mass index should be between 23-30. Your Body mass index is 37.59 kg/m. If this is out of the aforementioned range listed, please consider follow up with your Primary Care Provider.  If you are age 87 or younger, your body mass index should be between 19-25. Your Body mass index is 37.59 kg/m. If this is out of the aformentioned range listed, please consider follow up with your Primary Care Provider.   ________________________________________________________   We will request your records from [place] for previous procedures.  Thank you for entrusting me with your care and for choosing [employer], Alonza Bogus, P.A. - C.

## 2022-06-21 NOTE — Progress Notes (Addendum)
06/21/2022 Gina Key [id number] 12-16-1945   HISTORY OF PRESENT ILLNESS: This is a 76 year old female who is new to our office.  She had been referred here for GERD.  She recently relocated to [facility] from [place] area.  There she has had EGDs and colonoscopies.  She does not remember when those were last performed, but she thinks colonoscopy was many years ago.  She does not really want another colonoscopy unless absolutely necessary.  In regards to her upper GI symptoms she tells me that she had come off of her pantoprazole for a while during her transition and moved to [place].  She developed some epigastric abdominal pain and has now been back on the pantoprazole again 20 mg twice daily for the past month or so and is feeling much better.  Says that overall she feels pretty good.  She has a good appetite.  She says that since having her gallbladder removed years ago she has had episodes with some postprandial diarrhea episodically/intermittently with episodes of incontinence, but this has been going on for years and only happens on occasion.  Denies any rectal bleeding.  She does have a history of seizures, her last seizure was in September and it was quite a significant one.  Her daughter says that she has not quite been the same with her memory and things since that time.   Past Medical History:  Diagnosis Date   Acid reflux    DDD (degenerative disc disease), lumbar    Facet joint disease    GERD (gastroesophageal reflux disease)    High cholesterol    Hypertension    OSA (obstructive sleep apnea)    Primary osteoarthritis involving multiple joints    Past Surgical History:  Procedure Laterality Date   BREAST BIOPSY     CHOLECYSTECTOMY     left and right rotator cuff      reports that she has never smoked. She has never used smokeless tobacco. She reports that she does not drink alcohol and does not use drugs. family history includes Kidney cancer in her  daughter; Pancreatic cancer in her mother; Seizures in her grandson, paternal grandmother, and son; Sleep apnea in her daughter, son, and son; Stroke in her son; Thyroid cancer in her sister. No Known Allergies    Outpatient Encounter Medications as of 06/21/2022  Medication Sig   aspirin 325 MG EC tablet Take 325 mg by mouth daily.   atorvastatin (LIPITOR) 40 MG tablet Take 1 tablet (40 mg total) by mouth daily.   Incontinence Supply Disposable (WINGS HL ADULT BRIEFS/XL) MISC As needed daily.   ipratropium (ATROVENT) 0.06 % nasal spray Place 2 sprays into both nostrils 4 (four) times daily.   levETIRAcetam (KEPPRA) 500 MG tablet TAKE 1 TABLET BY MOUTH TWICE A DAY   losartan (COZAAR) 100 MG tablet Take 1 tablet (100 mg total) by mouth daily.   metoprolol succinate (TOPROL-XL) 25 MG 24 hr tablet Take 1 tablet (25 mg total) by mouth daily.   montelukast (SINGULAIR) 10 MG tablet Take 1 tablet (10 mg total) by mouth at bedtime.   pantoprazole (PROTONIX) 20 MG tablet TAKE 1 TABLET BY MOUTH EVERY DAY   potassium chloride (KLOR-CON) 10 MEQ tablet Take 10 mEq by mouth daily.   spironolactone (ALDACTONE) 25 MG tablet Take 1 tablet (25 mg total) by mouth daily.   topiramate (TOPAMAX) 100 MG tablet Take 1 tablet (100 mg total) by mouth 2 (two) times daily.   No facility-administered  encounter medications on file as of 06/21/2022.     REVIEW OF SYSTEMS  : All other systems reviewed and negative except where noted in the History of Present Illness.   PHYSICAL EXAM: BP 130/76   Pulse 76   Ht '5\' 2"'$  (1.575 m)   Wt 205 lb 8 oz (93.2 kg)   BMI 37.59 kg/m  General: Well developed female in no acute distress Head: Normocephalic and atraumatic Eyes:  Sclerae anicteric, conjunctiva pink. Ears: Normal auditory acuity Lungs: Clear throughout to auscultation; no W/R/R. Heart: Regular rate and rhythm; no M/R/G. Abdomen: Soft, non-distended.  BS present.  Non-tender. Musculoskeletal: Symmetrical with no  gross deformities  Skin: No lesions on visible extremities Extremities: No edema  Neurological: Alert oriented x 4, grossly nonfocal Psychological:  Alert and cooperative. Normal mood and affect  ASSESSMENT AND PLAN: *GERD: Had been off of her PPI for a little while during her move and transition to [place].  She started having some epigastric abdominal pain, but started back on pantoprazole about a month or so ago and is feeling much better.  We will get records from her previous EGDs and colonoscopies from [place] for our records and determine need for further follow-up of those.  **Addendum: Received records from previous procedures at [facility].  These will be sent for scanning.  She had an EGD in February 2021 that showed an irregular Z-line with normal mucosa in the upper third of the esophagus and the middle third of the esophagus and in the lower third of the esophagus.  She bilious gastric fluid.  Normal mucosa found in the cardia, gastric fundus, gastric body, and antrum.  Duodenum was normal.  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 pylori or dysplasia.  Esophageal biopsies in the distal esophagus showed squamous and gastric type mucosa with reflux type changes negative for intestinal metaplasia or EOE.  She also had an EGD in June 2017 that showed irregular Z-line, normal esophagus, clear gastric fluid, normal examined mucosa in the entire stomach, and normal duodenum.  These biopsies showed squamous and gastric mucosa with features of GERD negative for Barrett's esophagus.  She has had an EGD in January 2014 that showed LA grade A esophagitis, remaining esophagus was normal, clear gastric fluid with normal mucosa in the entire stomach and normal duodenum.  These biopsies showed normal duodenum without evidence of sprue.  Gastric biopsy showed gastric mucosa with mild reactive gastropathy.   Distal esophageal biopsies showed cardiac type gastric mucosa with intestinal metaplasia in keeping with Barrett's esophagus.  Mid esophageal biopsies were normal.  Colonoscopy January 2014 showed normal mucosa in the rectum, sigmoid colon, descending colon, transverse colon, ascending colon, cecum, and appendiceal orifice.  There was some stool in the sigmoid colon.  Repeat was recommended in 10 years.  Terminal ileum biopsies were normal and right and left colon biopsies were normal.  CC:  Billie Ruddy, MD

## 2022-06-25 DIAGNOSIS — G4733 Obstructive sleep apnea (adult) (pediatric): Secondary | ICD-10-CM | POA: Diagnosis not present

## 2022-07-05 ENCOUNTER — Other Ambulatory Visit: Payer: Self-pay | Admitting: Neurology

## 2022-07-07 ENCOUNTER — Telehealth: Payer: Self-pay

## 2022-07-07 NOTE — Telephone Encounter (Signed)
Left message on machine to call back  

## 2022-07-07 NOTE — Telephone Encounter (Signed)
-----   Message from Loralie Champagne, [place] sent at 07/07/2022 12:57 PM EST ----- Please let the patient and her daughter know that we did get her records from [facility].  She is actually due for colonoscopy in January so I would recommend that we go ahead and schedule that and we will repeat an EGD at the same time.  Please schedule those with Dr. Tarri Glenn if she is willing.  Should be able to be performed in the [facility], but confirm that she has not had any other issues with seizures recently.  I believe that her last seizure was in September.  Thank you,  Jess

## 2022-07-10 NOTE — Telephone Encounter (Signed)
Left message on machine to call back  

## 2022-07-10 NOTE — Telephone Encounter (Signed)
I spoke with the pt daughter and scheduled the pt for endo colon. As well as previsit.  She has not had a seizure in many months. The last one being last April per daughter.

## 2022-07-22 ENCOUNTER — Other Ambulatory Visit: Payer: Self-pay | Admitting: Neurology

## 2022-07-22 ENCOUNTER — Other Ambulatory Visit: Payer: Self-pay | Admitting: Family Medicine

## 2022-07-22 DIAGNOSIS — Z8669 Personal history of other diseases of the nervous system and sense organs: Secondary | ICD-10-CM

## 2022-07-22 DIAGNOSIS — I10 Essential (primary) hypertension: Secondary | ICD-10-CM

## 2022-07-22 DIAGNOSIS — K219 Gastro-esophageal reflux disease without esophagitis: Secondary | ICD-10-CM

## 2022-07-22 DIAGNOSIS — J302 Other seasonal allergic rhinitis: Secondary | ICD-10-CM

## 2022-07-22 DIAGNOSIS — E782 Mixed hyperlipidemia: Secondary | ICD-10-CM

## 2022-07-26 DIAGNOSIS — G4733 Obstructive sleep apnea (adult) (pediatric): Secondary | ICD-10-CM | POA: Diagnosis not present

## 2022-07-27 NOTE — Progress Notes (Signed)
Reviewed and agree with management plans. Plan EGD with biopsies for evaluation of recent symptoms and to stage/follow-up on intestinal metaplasia and surveillance colonoscopy.  Marquist Binstock L. Tarri Glenn, MD, MPH

## 2022-08-22 ENCOUNTER — Ambulatory Visit (AMBULATORY_SURGERY_CENTER): Payer: Medicare HMO | Admitting: *Deleted

## 2022-08-22 ENCOUNTER — Encounter: Payer: Medicare HMO | Admitting: Gastroenterology

## 2022-08-22 ENCOUNTER — Encounter: Payer: Self-pay | Admitting: Gastroenterology

## 2022-08-22 VITALS — Ht 62.0 in | Wt 206.6 lb

## 2022-08-22 DIAGNOSIS — Z1211 Encounter for screening for malignant neoplasm of colon: Secondary | ICD-10-CM

## 2022-08-22 DIAGNOSIS — Z8719 Personal history of other diseases of the digestive system: Secondary | ICD-10-CM

## 2022-08-22 MED ORDER — NA SULFATE-K SULFATE-MG SULF 17.5-3.13-1.6 GM/177ML PO SOLN: 1.0000 | Freq: Once | ORAL | 0 refills | Status: AC

## 2022-08-22 NOTE — Progress Notes (Signed)
No egg or soy allergy known to patient  No issues known to pt with past sedation with any surgeries or procedures Patient denies ever being told they had issues or difficulty with intubation  No FH of Malignant Hyperthermia Pt is not on diet pills Pt is not on  home 02  Pt is not on blood thinners  Pt has issues with constipation  No A fib or A flutter Have any cardiac testing pending--NO Pt instructed to use Singlecare.com or GoodRx for a price reduction on prep    Patient's chart reviewed by Osvaldo Angst CNRA prior to previsit and patient appropriate for the [place].  Previsit completed and red dot placed by patient's name on their procedure day (on provider's schedule).

## 2022-08-26 DIAGNOSIS — G4733 Obstructive sleep apnea (adult) (pediatric): Secondary | ICD-10-CM | POA: Diagnosis not present

## 2022-08-29 ENCOUNTER — Ambulatory Visit (AMBULATORY_SURGERY_CENTER): Payer: Medicare HMO | Admitting: Gastroenterology

## 2022-08-29 ENCOUNTER — Encounter: Payer: Self-pay | Admitting: Gastroenterology

## 2022-08-29 VITALS — BP 115/70 | HR 72 | Temp 96.2°F | Resp 17 | Ht 62.0 in | Wt 206.0 lb

## 2022-08-29 DIAGNOSIS — Z1211 Encounter for screening for malignant neoplasm of colon: Secondary | ICD-10-CM

## 2022-08-29 DIAGNOSIS — D122 Benign neoplasm of ascending colon: Secondary | ICD-10-CM

## 2022-08-29 DIAGNOSIS — K219 Gastro-esophageal reflux disease without esophagitis: Secondary | ICD-10-CM | POA: Diagnosis not present

## 2022-08-29 DIAGNOSIS — K319 Disease of stomach and duodenum, unspecified: Secondary | ICD-10-CM | POA: Diagnosis not present

## 2022-08-29 DIAGNOSIS — K3189 Other diseases of stomach and duodenum: Secondary | ICD-10-CM | POA: Diagnosis not present

## 2022-08-29 DIAGNOSIS — K2950 Unspecified chronic gastritis without bleeding: Secondary | ICD-10-CM | POA: Diagnosis not present

## 2022-08-29 MED ORDER — SODIUM CHLORIDE 0.9 % IV SOLN: 500.0000 mL | Freq: Once | INTRAVENOUS | Status: DC

## 2022-08-29 NOTE — Op Note (Signed)
[facility] Patient Name: Sharetha Newson Procedure Date: 08/29/2022 10:24 AM MRN: [id number] Endoscopist: Thornton Park MD, MD, [id number] Age: 77 Referring MD:  Date of Birth: March 13, 1946 Gender: Female Account #: [id number] Procedure:                Colonoscopy Indications:              Screening for colorectal malignant neoplasm                           No polyps on colonoscopy in [place] in 2014 Medicines:                Monitored Anesthesia Care Procedure:                Pre-Anesthesia Assessment:                           - Prior to the procedure, a History and Physical                            was performed, and patient medications and                            allergies were reviewed. The patient's tolerance of                            previous anesthesia was also reviewed. The risks                            and benefits of the procedure and the sedation                            options and risks were discussed with the patient.                            All questions were answered, and informed consent                            was obtained. Prior Anticoagulants: The patient has                            taken no anticoagulant or antiplatelet agents. ASA                            Grade Assessment: III - A patient with severe                            systemic disease. After reviewing the risks and                            benefits, the patient was deemed in satisfactory                            condition to undergo the procedure.  After obtaining informed consent, the colonoscope                            was passed under direct vision. Throughout the                            procedure, the patient's blood pressure, pulse, and                            oxygen saturations were monitored continuously. The                            CF [id number] #[id number] was introduced through the anus                            and  advanced to the 3 cm into the ileum. The                            colonoscopy was performed without difficulty. The                            patient tolerated the procedure well. The quality                            of the bowel preparation was good. The terminal                            ileum, ileocecal valve, appendiceal orifice, and                            rectum were photographed. Scope In: 10:49:48 AM Scope Out: 11:03:12 AM Scope Withdrawal Time: 0 hours 10 minutes 45 seconds  Total Procedure Duration: 0 hours 13 minutes 24 seconds  Findings:                 The perianal and digital rectal examinations were                            normal.                           A few medium-mouthed and small-mouthed diverticula                            were found in the sigmoid colon and descending                            colon.                           A 3 mm polyp was found in the ascending colon. The                            polyp was flat. The polyp was removed with a cold  snare. Resection and retrieval were complete.                            Estimated blood loss was minimal.                           The exam was otherwise without abnormality on                            direct and retroflexion views. Complications:            No immediate complications. Estimated Blood Loss:     Estimated blood loss was minimal. Impression:               - Diverticulosis in the sigmoid colon and in the                            descending colon.                           - One 3 mm polyp in the ascending colon, removed                            with a cold snare. Resected and retrieved.                           - The examination was otherwise normal on direct                            and retroflexion views. Recommendation:           - Patient has a contact number available for                            emergencies. The signs and symptoms of  potential                            delayed complications were discussed with the                            patient. Return to normal activities tomorrow.                            Written discharge instructions were provided to the                            patient.                           - Continue present medications.                           - Await pathology results.                           - Repeat colonoscopy is not recommended due to  current age (78 years or older) for surveillance.                           - Follow a high fiber diet. Drink at least 64                            ounces of water daily. Add a daily stool bulking                            agent such as psyllium (an exampled would be                            Metamucil).                           - Emerging evidence supports eating a diet of                            fruits, vegetables, grains, calcium, and yogurt                            while reducing red meat and alcohol may reduce the                            risk of colon cancer.                           - Thank you for allowing me to be involved in your                            colon cancer prevention. Thornton Park MD, MD 08/29/2022 11:14:38 AM This report has been signed electronically.

## 2022-08-29 NOTE — Progress Notes (Signed)
A and O x3. Report to RN. Tolerated MAC anesthesia well.Teeth unchanged after procedure. 

## 2022-08-29 NOTE — Patient Instructions (Signed)
Discharge instructions given. Handouts on Gastritis,polyps and Diverticulosis. Resume previous medications. YOU HAD AN ENDOSCOPIC PROCEDURE TODAY AT [facility]:   Refer to the procedure report that was given to you for any specific questions about what was found during the examination.  If the procedure report does not answer your questions, please call your gastroenterologist to clarify.  If you requested that your care partner not be given the details of your procedure findings, then the procedure report has been included in a sealed envelope for you to review at your convenience later.  YOU SHOULD EXPECT: Some feelings of bloating in the abdomen. Passage of more gas than usual.  Walking can help get rid of the air that was put into your GI tract during the procedure and reduce the bloating. If you had a lower endoscopy (such as a colonoscopy or flexible sigmoidoscopy) you may notice spotting of blood in your stool or on the toilet paper. If you underwent a bowel prep for your procedure, you may not have a normal bowel movement for a few days.  Please Note:  You might notice some irritation and congestion in your nose or some drainage.  This is from the oxygen used during your procedure.  There is no need for concern and it should clear up in a day or so.  SYMPTOMS TO REPORT IMMEDIATELY:  Following lower endoscopy (colonoscopy or flexible sigmoidoscopy):  Excessive amounts of blood in the stool  Significant tenderness or worsening of abdominal pains  Swelling of the abdomen that is new, acute  Fever of 100F or higher  Following upper endoscopy (EGD)  Vomiting of blood or coffee ground material  New chest pain or pain under the shoulder blades  Painful or persistently difficult swallowing  New shortness of breath  Fever of 100F or higher  Black, tarry-looking stools  For urgent or emergent issues, a gastroenterologist can be reached at any hour by calling [phone]  [phone]. Do not use MyChart messaging for urgent concerns.    DIET:  We do recommend a small meal at first, but then you may proceed to your regular diet.  Drink plenty of fluids but you should avoid alcoholic beverages for 24 hours.  ACTIVITY:  You should plan to take it easy for the rest of today and you should NOT DRIVE or use heavy machinery until tomorrow (because of the sedation medicines used during the test).    FOLLOW UP: Our staff will call the number listed on your records the next business day following your procedure.  We will call around 7:15- 8:00 am to check on you and address any questions or concerns that you may have regarding the information given to you following your procedure. If we do not reach you, we will leave a message.     If any biopsies were taken you will be contacted by phone or by letter within the next 1-3 weeks.  Please call us at [phone] if you have not heard about the biopsies in 3 weeks.    SIGNATURES/CONFIDENTIALITY: You and/or your care partner have signed paperwork which will be entered into your electronic medical record.  These signatures attest to the fact that that the information above on your After Visit Summary has been reviewed and is understood.  Full responsibility of the confidentiality of this discharge information lies with you and/or your care-partner.

## 2022-08-29 NOTE — Progress Notes (Signed)
Referring Provider: Billie Ruddy, MD Primary Care Physician:  Billie Ruddy, MD  Indication for upper endoscopy: Reflux, epigastric abdominal pain, intestinal metaplasia Indication for colonoscopy:  Colon cancer Surveillance   IMPRESSION:  Reflux Epigastric abdominal pain Intestinal metaplasia on endoscopic biopsies in 2021 in [place] History of Barrett's esophagus on prior biopsies in 2014 Need for colon cancer surveillance Appropriate candidate for monitored anesthesia care  PLAN: Colonoscopy in the [place] today   HPI: Gina Key is a 77 y.o. female presents for EGD and colonoscopy.  Prior endoscopic history: She had an EGD in February 2021 that showed an irregular Z-line with normal mucosa in the upper third of the esophagus and the middle third of the esophagus and in the lower third of the esophagus.  She bilious gastric fluid.  Normal mucosa found in the cardia, gastric fundus, gastric body, and antrum.  Duodenum was normal.  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 pylori or dysplasia.  Esophageal biopsies in the distal esophagus showed squamous and gastric type mucosa with reflux type changes negative for intestinal metaplasia or EOE.   She also had an EGD in June 2017 that showed irregular Z-line, normal esophagus, clear gastric fluid, normal examined mucosa in the entire stomach, and normal duodenum.  These biopsies showed squamous and gastric mucosa with features of GERD negative for Barrett's esophagus.   She has had an EGD in January 2014 that showed LA grade A esophagitis, remaining esophagus was normal, clear gastric fluid with normal mucosa in the entire stomach and normal duodenum.  These biopsies showed normal duodenum without evidence of sprue.  Gastric biopsy showed gastric mucosa with mild reactive gastropathy.  Distal esophageal biopsies showed cardiac type gastric mucosa  with intestinal metaplasia in keeping with Barrett's esophagus.  Mid esophageal biopsies were normal.   Colonoscopy January 2014 showed normal mucosa in the rectum, sigmoid colon, descending colon, transverse colon, ascending colon, cecum, and appendiceal orifice.  There was some stool in the sigmoid colon.  Repeat was recommended in 10 years.  Terminal ileum biopsies were normal and right and left colon biopsies were normal.   No baseline GI symptoms.   No known family history of colon cancer or polyps. No family history of uterine/endometrial cancer, pancreatic cancer or gastric/stomach cancer.   Past Medical History:  Diagnosis Date   Acid reflux    Allergy    Anxiety    DDD (degenerative disc disease), lumbar    Depression    Facet joint disease    GERD (gastroesophageal reflux disease)    High cholesterol    Hypertension    OSA (obstructive sleep apnea)    Primary osteoarthritis involving multiple joints    Seizures (HCC)    Sleep apnea    C PAP    Past Surgical History:  Procedure Laterality Date   BREAST BIOPSY     CHOLECYSTECTOMY     left and right rotator cuff      Current Outpatient Medications  Medication Sig Dispense Refill   aspirin 325 MG EC tablet Take 325 mg by mouth daily.     atorvastatin (LIPITOR) 40 MG tablet Take 1 tablet (40 mg total) by mouth daily. SCHEDULE ANNUAL VISIT FOR FUTURE REFILLS 90 tablet 0   ipratropium (ATROVENT) 0.06 % nasal spray Place 2 sprays into both nostrils 4 (four) times daily. (Patient taking differently: Place 2 sprays into both nostrils as needed.) 15 mL 2  levETIRAcetam (KEPPRA) 500 MG tablet TAKE 1 TABLET BY MOUTH TWICE A DAY 180 tablet 0   losartan (COZAAR) 100 MG tablet TAKE 1 TABLET BY MOUTH EVERY DAY 90 tablet 1   metoprolol succinate (TOPROL-XL) 25 MG 24 hr tablet TAKE 1 TABLET (25 MG TOTAL) BY MOUTH DAILY. 90 tablet 1   montelukast (SINGULAIR) 10 MG tablet TAKE 1 TABLET BY MOUTH EVERYDAY AT BEDTIME 90 tablet 1    pantoprazole (PROTONIX) 20 MG tablet TAKE 1 TABLET BY MOUTH EVERY DAY 90 tablet 1   potassium chloride (KLOR-CON) 10 MEQ tablet Take 10 mEq by mouth daily.     spironolactone (ALDACTONE) 25 MG tablet TAKE 1 TABLET (25 MG TOTAL) BY MOUTH DAILY. 90 tablet 1   topiramate (TOPAMAX) 100 MG tablet TAKE 1 TABLET BY MOUTH TWICE A DAY 180 tablet 1   Incontinence Supply Disposable (WINGS HL ADULT BRIEFS/XL) MISC As needed daily. 100 each 5   Current Facility-Administered Medications  Medication Dose Route Frequency Provider Last Rate Last Admin   0.9 %  sodium chloride infusion  500 mL Intravenous Once Thornton Park, MD        Allergies as of 08/29/2022   (No Known Allergies)    Family History  Problem Relation Age of Onset   Pancreatic cancer Mother    Thyroid cancer Sister    Cancer Brother        tongue   Seizures Paternal Grandmother    Colon polyps Daughter    Kidney cancer Daughter    Sleep apnea Daughter    Sleep apnea Son    Stroke Son    Seizures Son    Sleep apnea Son    Seizures Grandson    Colon cancer Neg Hx    Crohn's disease Neg Hx    Esophageal cancer Neg Hx    Rectal cancer Neg Hx    Stomach cancer Neg Hx    Ulcerative colitis Neg Hx      Physical Exam: General:   Alert,  well-nourished, pleasant and cooperative in NAD Head:  Normocephalic and atraumatic. Eyes:  Sclera clear, no icterus.   Conjunctiva pink. Mouth:  No deformity or lesions.   Neck:  Supple; no masses or thyromegaly. Lungs:  Clear throughout to auscultation.   No wheezes. Heart:  Regular rate and rhythm; no murmurs. Abdomen:  Soft, non-tender, nondistended, normal bowel sounds, no rebound or guarding.  Msk:  Symmetrical. No boney deformities LAD: No inguinal or umbilical LAD Extremities:  No clubbing or edema. Neurologic:  Alert and  oriented x4;  grossly nonfocal Skin:  No obvious rash or bruise. Psych:  Alert and cooperative. Normal mood and affect.     Studies/Results: No results  found.    Gina Key L. Tarri Glenn, MD, MPH 08/29/2022, 10:27 AM

## 2022-08-29 NOTE — Op Note (Signed)
[facility] Patient Name: Gina Key Procedure Date: 08/29/2022 10:29 AM MRN: [id number] Endoscopist: Thornton Park MD, MD, [id number] Age: 77 Referring MD:  Date of Birth: May 03, 1946 Gender: Female Account #: [id number] Procedure:                Upper GI endoscopy Indications:              Follow-up of reflux esophagitis, Gastric intestinal                            metaplasia without dysplasia identified on prior                            EGD in [place] Medicines:                Monitored Anesthesia Care Procedure:                Pre-Anesthesia Assessment:                           - Prior to the procedure, a History and Physical                            was performed, and patient medications and                            allergies were reviewed. The patient's tolerance of                            previous anesthesia was also reviewed. The risks                            and benefits of the procedure and the sedation                            options and risks were discussed with the patient.                            All questions were answered, and informed consent                            was obtained. Prior Anticoagulants: The patient has                            taken no anticoagulant or antiplatelet agents. ASA                            Grade Assessment: II - A patient with mild systemic                            disease. After reviewing the risks and benefits,                            the patient was deemed in satisfactory condition to  undergo the procedure.                           After obtaining informed consent, the endoscope was                            passed under direct vision. Throughout the                            procedure, the patient's blood pressure, pulse, and                            oxygen saturations were monitored continuously. The                            Olympus Scope [id number] was  introduced through the                            mouth, and advanced to the third part of duodenum.                            The upper GI endoscopy was accomplished without                            difficulty. The patient tolerated the procedure                            well. Scope In: Scope Out: Findings:                 The esophagus was normal. No Barrett's esophagus.                           Patchy mildly erythematous mucosa without bleeding                            was found in the gastric body. Biopsies were taken                            from the antrum, angularis, greater curve, lesser                            curve, and fundus with a cold forceps for                            histology. Estimated blood loss was minimal.                           The examined duodenum was normal.                           The cardia and gastric fundus were normal on                            retroflexion.  The exam was otherwise without abnormality. Complications:            No immediate complications. Estimated Blood Loss:     Estimated blood loss was minimal. Impression:               - Normal esophagus.                           - Gastritis in the setting of prior diagnosis of                            intestinal metaplasia. Biopsied.                           - Normal examined duodenum.                           - The examination was otherwise normal. Recommendation:           - Patient has a contact number available for                            emergencies. The signs and symptoms of potential                            delayed complications were discussed with the                            patient. Return to normal activities tomorrow.                            Written discharge instructions were provided to the                            patient.                           - Resume previous diet including pantoprazole 20 mg                             daily.                           - Continue present medications.                           - Await pathology results.                           - Reflux lifestyle modifications. Thornton Park MD, MD 08/29/2022 11:11:00 AM This report has been signed electronically.

## 2022-08-29 NOTE — Progress Notes (Signed)
Called to room to assist during endoscopic procedure.  Patient ID and intended procedure confirmed with present staff. Received instructions for my participation in the procedure from the performing physician.  

## 2022-08-29 NOTE — Progress Notes (Signed)
Pt's states no medical or surgical changes since previsit or office visit. 

## 2022-08-30 ENCOUNTER — Telehealth: Payer: Self-pay

## 2022-08-30 NOTE — Telephone Encounter (Signed)
  Follow up Call-     08/29/2022   10:18 AM  Call back number  Post procedure Call Back phone  # [phone]  Permission to leave phone message Yes     Patient questions:  Do you have a fever, pain , or abdominal swelling? No. Pain Score  0 *  Have you tolerated food without any problems? Yes.    Have you been able to return to your normal activities? Yes.    Do you have any questions about your discharge instructions: Diet   No. Medications  No. Follow up visit  No.  Do you have questions or concerns about your Care? No.  Actions: * If pain score is 4 or above: No action needed, pain <4.  Spoke with patient's daughter

## 2022-09-08 ENCOUNTER — Encounter: Payer: Self-pay | Admitting: Gastroenterology

## 2022-09-12 DIAGNOSIS — G4733 Obstructive sleep apnea (adult) (pediatric): Secondary | ICD-10-CM | POA: Diagnosis not present

## 2022-09-18 ENCOUNTER — Other Ambulatory Visit: Payer: Self-pay | Admitting: Family Medicine

## 2022-09-18 DIAGNOSIS — J302 Other seasonal allergic rhinitis: Secondary | ICD-10-CM

## 2022-09-24 DIAGNOSIS — G4733 Obstructive sleep apnea (adult) (pediatric): Secondary | ICD-10-CM | POA: Diagnosis not present

## 2022-10-17 ENCOUNTER — Telehealth (INDEPENDENT_AMBULATORY_CARE_PROVIDER_SITE_OTHER): Payer: Medicare HMO | Admitting: Neurology

## 2022-10-17 DIAGNOSIS — Z87898 Personal history of other specified conditions: Secondary | ICD-10-CM | POA: Diagnosis not present

## 2022-10-17 DIAGNOSIS — G4733 Obstructive sleep apnea (adult) (pediatric): Secondary | ICD-10-CM

## 2022-10-17 MED ORDER — LEVETIRACETAM 500 MG PO TABS: 500.0000 mg | ORAL_TABLET | Freq: Two times a day (BID) | ORAL | 3 refills | Status: DC

## 2022-10-17 NOTE — Progress Notes (Signed)
Virtual Visit via Video Note  I connected with Gina Key on 10/17/22 at 12:45 PM EDT by a video enabled telemedicine application and verified that I am speaking with the correct person using two identifiers.  Location: Patient: at her home Provider: in the office    I discussed the limitations of evaluation and management by telemedicine and the availability of in person appointments. The patient expressed understanding and agreed to proceed.  History of Present Illness: Today October 17, 2022 SS: Here today for follow-up via telephone visit, we could not connect virtually.  She is accompanied by her daughter.  Review of CPAP data 09/16/2022-10/11/2022 shows suboptimal usage 10/30 days at 33%, greater than 4 hours 30%.  Average usage days used 8 hours 11 minutes.  Minimum pressure 5, maximum pressure 11 cm water.  EPR level 3.  Leak 1.2, AHI 0.6.  Indicates her usage has been poor due to travel, taking care of sick family members.  Prior in January she cannot use CPAP because she was waiting for a new mask.  She is using full face.  She does think she benefits from CPAP.  She is motivated to get back to consistent use.  She denies any seizures.  Remains on Keppra without side effect.  04/11/22 Dr. Rexene Key: Ms. Gina Key is a 77 year old right-handed woman with an underlying medical history of reflux disease, allergies, history of migraines, history of seizure (by chart review), lipidemia, arthritis, hypertension, and obesity, who presents for follow-up consultation of her sleep apnea after interim testing and starting AutoPap therapy.  Patient is accompanied by her daughter again today.  I first met her at the request of her primary care physician on 12/05/2021, at which time she reported a prior diagnosis of obstructive sleep apnea.  She was no longer on positive airway pressure treatment.  She was advised to proceed with a sleep study.  I saw her for a new problem visit on 12/13/2021 for an episode  of unresponsiveness.  She had presented to the emergency room and had been started on Keppra.  We proceeded with an EEG.  She had an EEG on 12/15/2021 and I reviewed the results:  Impression: This is a normal EEG recording in the waking and drowsy state. No evidence of interictal epileptiform discharges seen. A normal EEG does not exclude a diagnosis of epilepsy.     She was notified via MyChart message.   She had a home sleep test on 01/17/2022 which indicated overall mild obstructive sleep apnea with a total AHI of 8.2/hour and O2 nadir of 87%. Snoring was detected, and appeared to be mild to moderate, intermittent.  She was advised to proceed with AutoPap therapy.  Her set up date was 02/23/2022.  She has a ResMed AirSense 11 AutoSet machine.   Today, 04/11/2022: I reviewed her AutoPap compliance data from 03/11/2022 through 04/09/2022, which is a total of 30 days, during which time she used her machine 29 days with percent use days greater than 4 hours at 80%, indicating excellent compliance with an average usage of 5 hours and 17 minutes, residual AHI at goal at 0.6/h, average pressure for the 95th percentile at 8 cm with a range of 5 to 11 cm with EPR.  Leak rather low with the 95th percentile at 0.8 L/min.  She reports doing fairly well with the machine, she is certainly motivated to continue with treatment and is compliant with it but has had some trouble lately with the display  of the machine and with the comfort of the mask.  She is using a medium [place] 3B fullface mask, she also has a small insert but was told that the small is too small for her.  But the looks of it, the small seems to be a better size for her from my standpoint but she does have an appointment to discuss mask and machine issues with her DME provider.  She also has had instances of water in the tubing which was uncomfortable as it rose into the mask.  She has had recent stress, her son has been hospitalized for unresponsiveness and  seizure as I understand.  Understandably, she is worried about him.  She has not had any further episodes of unresponsiveness or seizure-like activity herself.  She continues to take Keppra 500 mg twice daily.  She does not always hydrate well with water, per daughter.  Patient estimates that she drinks about 2 bottles of water per day.  She likes to drink V8 energy drink, 1 can/day and occasional soda.  Her daytime energy has improved but she does doze off sometimes according to the daughter.   Observations/Objective: Via telephone visit, patient is alert and oriented, she and her daughter provide history  Assessment and Plan: 1.  Obstructive sleep apnea -HST June 2023 showed mild OSA -Encouraged to get back to consistent CPAP use nightly for greater than 4 hours, discussed the need for minimum 70% for insurance requirement, when she gets back to consistent use she can let me know and I can pull a new download, otherwise we will check in 6 months, encouraged to bring CPAP for travel  2.  History of seizures -Continue Keppra 500 mg twice daily, refill sent in  Follow Up Instructions: 6 months   I discussed the assessment and treatment plan with the patient. The patient was provided an opportunity to ask questions and all were answered. The patient agreed with the plan and demonstrated an understanding of the instructions.   The patient was advised to call back or seek an in-person evaluation if the symptoms worsen or if the condition fails to improve as anticipated.  I spent 15 minutes on this telephone call.   Evangeline Dakin, DNP  [facility] [address], [place], [place] [address] [phone]

## 2022-10-25 DIAGNOSIS — G4733 Obstructive sleep apnea (adult) (pediatric): Secondary | ICD-10-CM | POA: Diagnosis not present

## 2022-11-16 ENCOUNTER — Other Ambulatory Visit: Payer: Self-pay | Admitting: Family Medicine

## 2022-11-16 DIAGNOSIS — E782 Mixed hyperlipidemia: Secondary | ICD-10-CM

## 2022-11-24 DIAGNOSIS — G4733 Obstructive sleep apnea (adult) (pediatric): Secondary | ICD-10-CM | POA: Diagnosis not present

## 2022-11-27 ENCOUNTER — Other Ambulatory Visit: Payer: Self-pay | Admitting: Family Medicine

## 2022-11-27 DIAGNOSIS — J302 Other seasonal allergic rhinitis: Secondary | ICD-10-CM

## 2022-12-05 ENCOUNTER — Telehealth: Payer: Self-pay | Admitting: Family Medicine

## 2022-12-05 NOTE — Telephone Encounter (Signed)
Pt daughter called. Stating she wants to know who pt is doing on CPAP machine at night.

## 2022-12-05 NOTE — Telephone Encounter (Signed)
Called and relayed sarah's msg. They voiced gratitude and understanding

## 2022-12-05 NOTE — Telephone Encounter (Signed)
Routing to Gina Key to ask how she thinks the pt is doing based on res med report then I will call the pt daughter back for her regarding review of report and recommendations:

## 2022-12-05 NOTE — Telephone Encounter (Signed)
Overall, review of CPAP compliance looks good, 97% usage, greater than 4 hours 90%.  Pressure 5-11 cmH2O.  She is mostly staying around 8 for pressure.  Leak is 15.  AHI 0.4.  Overall it looks like her mask is fitting fairly well, apnea spells are well treated.  I am pleased.

## 2022-12-05 NOTE — Telephone Encounter (Signed)
Left msg to call back 1ST ATTEMPT

## 2022-12-07 ENCOUNTER — Encounter: Payer: Self-pay | Admitting: Family Medicine

## 2022-12-07 ENCOUNTER — Ambulatory Visit (INDEPENDENT_AMBULATORY_CARE_PROVIDER_SITE_OTHER): Payer: Medicare HMO | Admitting: Family Medicine

## 2022-12-07 ENCOUNTER — Ambulatory Visit (INDEPENDENT_AMBULATORY_CARE_PROVIDER_SITE_OTHER): Payer: Medicare HMO

## 2022-12-07 VITALS — BP 138/72 | HR 70 | Temp 98.7°F | Ht 62.0 in | Wt 208.8 lb

## 2022-12-07 DIAGNOSIS — Z0001 Encounter for general adult medical examination with abnormal findings: Secondary | ICD-10-CM | POA: Diagnosis not present

## 2022-12-07 DIAGNOSIS — I10 Essential (primary) hypertension: Secondary | ICD-10-CM

## 2022-12-07 DIAGNOSIS — M19011 Primary osteoarthritis, right shoulder: Secondary | ICD-10-CM | POA: Diagnosis not present

## 2022-12-07 DIAGNOSIS — M25511 Pain in right shoulder: Secondary | ICD-10-CM | POA: Diagnosis not present

## 2022-12-07 DIAGNOSIS — Z6838 Body mass index (BMI) 38.0-38.9, adult: Secondary | ICD-10-CM | POA: Diagnosis not present

## 2022-12-07 DIAGNOSIS — E782 Mixed hyperlipidemia: Secondary | ICD-10-CM | POA: Diagnosis not present

## 2022-12-07 LAB — LIPID PANEL
Cholesterol: 131 mg/dL (ref 0–200)
HDL: 48.5 mg/dL (ref >39.00)
LDL Cholesterol: 74 mg/dL (ref 0–99)
NonHDL: 82.47
Total CHOL/HDL Ratio: 3
Triglycerides: 44 mg/dL (ref 0.0–149.0)
VLDL: 8.8 mg/dL (ref 0.0–40.0)

## 2022-12-07 LAB — COMPREHENSIVE METABOLIC PANEL
ALT: 19 U/L (ref 0–35)
AST: 21 U/L (ref 0–37)
Albumin: 4.1 g/dL (ref 3.5–5.2)
Alkaline Phosphatase: 125 U/L — ABNORMAL HIGH (ref 39–117)
BUN: 20 mg/dL (ref 6–23)
CO2: 23 mEq/L (ref 19–32)
Calcium: 9.4 mg/dL (ref 8.4–10.5)
Chloride: 115 mEq/L — ABNORMAL HIGH (ref 96–112)
Creatinine, Ser: 0.85 mg/dL (ref 0.40–1.20)
GFR: 66.7 mL/min (ref >60.00)
Glucose, Bld: 95 mg/dL (ref 70–99)
Potassium: 4 mEq/L (ref 3.5–5.1)
Sodium: 145 mEq/L (ref 135–145)
Total Bilirubin: 0.3 mg/dL (ref 0.2–1.2)
Total Protein: 7 g/dL (ref 6.0–8.3)

## 2022-12-07 LAB — CBC WITH DIFFERENTIAL/PLATELET
Basophils Absolute: 0 10*3/uL (ref 0.0–0.1)
Basophils Relative: 0.3 % (ref 0.0–3.0)
Eosinophils Absolute: 0.1 10*3/uL (ref 0.0–0.7)
Eosinophils Relative: 1.6 % (ref 0.0–5.0)
HCT: 36.7 % (ref 36.0–46.0)
Hemoglobin: 11.9 g/dL — ABNORMAL LOW (ref 12.0–15.0)
Lymphocytes Relative: 28.3 % (ref 12.0–46.0)
Lymphs Abs: 2.3 10*3/uL (ref 0.7–4.0)
MCHC: 32.4 g/dL (ref 30.0–36.0)
MCV: 83 fl (ref 78.0–100.0)
Monocytes Absolute: 0.4 10*3/uL (ref 0.1–1.0)
Monocytes Relative: 5.1 % (ref 3.0–12.0)
Neutro Abs: 5.3 10*3/uL (ref 1.4–7.7)
Neutrophils Relative %: 64.7 % (ref 43.0–77.0)
Platelets: 208 10*3/uL (ref 150.0–400.0)
RBC: 4.42 Mil/uL (ref 3.87–5.11)
RDW: 13.7 % (ref 11.5–15.5)
WBC: 8.3 10*3/uL (ref 4.0–10.5)

## 2022-12-07 LAB — T4, FREE: Free T4: 0.79 ng/dL (ref 0.60–1.60)

## 2022-12-07 LAB — TSH: TSH: 1.61 u[IU]/mL (ref 0.35–5.50)

## 2022-12-07 LAB — VITAMIN D 25 HYDROXY (VIT D DEFICIENCY, FRACTURES): VITD: 16.99 ng/mL — ABNORMAL LOW (ref 30.00–100.00)

## 2022-12-07 LAB — HEMOGLOBIN A1C: Hgb A1c MFr Bld: 5.9 % (ref 4.6–6.5)

## 2022-12-07 NOTE — Progress Notes (Signed)
Established Patient Office Visit   Subjective  Patient ID: Gina Key, female    DOB: [birth year]  Age: 77 y.o. MRN: [id number]  Chief Complaint  Patient presents with   Annual Exam    [phone], wants to loose wt, has been trying but is not doing so well, does the protein shakes, slimfast, normal cooked meals. Just feels she needs to loose wt to help her with all the other issues  Having issues with rt arm when she fell last year.  Legs and knees swell a lot.     Patient is a 77 year old female with pmh sig for HTN, HLD, OSA, seasonal allergies, GERD, urinary incontinence seen for CPE.  Patient states she has been doing well overall.  Patient endorses several deaths in her family including her son.  Patient had a fall in May hurting her right shoulder.  Limited ROM of right shoulder and ongoing pain s/p injury.  Patient is not interested in surgery pain pills.  Patient inquires about weight loss medication.  States unable to walk for long distances due to pain and SOB.  Endorses doing some stretching each day.    Past Medical History:  Diagnosis Date   Acid reflux    Allergy    Anxiety    DDD (degenerative disc disease), lumbar    Depression    Facet joint disease    GERD (gastroesophageal reflux disease)    High cholesterol    Hypertension    OSA (obstructive sleep apnea)    Primary osteoarthritis involving multiple joints    Seizures (HCC)    Sleep apnea    C PAP   Past Surgical History:  Procedure Laterality Date   BREAST BIOPSY     CHOLECYSTECTOMY     left and right rotator cuff     Social History   Tobacco Use   Smoking status: Never   Smokeless tobacco: Never  Vaping Use   Vaping Use: Never used  Substance Use Topics   Alcohol use: Never   Drug use: Never   Family History  Problem Relation Age of Onset   Pancreatic cancer Mother    Thyroid cancer Sister    Cancer Brother        tongue   Seizures Paternal Grandmother    Colon polyps Daughter     Kidney cancer Daughter    Sleep apnea Daughter    Sleep apnea Son    Stroke Son    Seizures Son    Sleep apnea Son    Seizures Grandson    Colon cancer Neg Hx    Crohn's disease Neg Hx    Esophageal cancer Neg Hx    Rectal cancer Neg Hx    Stomach cancer Neg Hx    Ulcerative colitis Neg Hx    No Known Allergies    ROS Negative unless stated above    Objective:     BP 138/72 (BP Location: Left Arm, Patient Position: Sitting, Cuff Size: Normal)   Pulse 70   Temp 98.7 F (37.1 C) (Oral)   Ht 5\' 2"  (1.575 m)   Wt 208 lb 12.8 oz (94.7 kg)   SpO2 98%   BMI 38.19 kg/m  BP Readings from Last 3 Encounters:  12/07/22 138/72  08/29/22 115/70  06/21/22 130/76   Wt Readings from Last 3 Encounters:  12/07/22 208 lb 12.8 oz (94.7 kg)  08/29/22 206 lb (93.4 kg)  08/22/22 206 lb 9.6 oz (93.7 kg)  Physical Exam Constitutional:      Appearance: Normal appearance.  HENT:     Head: Normocephalic and atraumatic.     Right Ear: Tympanic membrane, ear canal and external ear normal.     Left Ear: Tympanic membrane, ear canal and external ear normal.     Nose: Nose normal.     Mouth/Throat:     Mouth: Mucous membranes are moist.     Pharynx: No oropharyngeal exudate or posterior oropharyngeal erythema.  Eyes:     General: No scleral icterus.    Extraocular Movements: Extraocular movements intact.     Conjunctiva/sclera: Conjunctivae normal.     Pupils: Pupils are equal, round, and reactive to light.  Neck:     Thyroid: No thyromegaly.  Cardiovascular:     Rate and Rhythm: Normal rate and regular rhythm.     Pulses: Normal pulses.     Heart sounds: Normal heart sounds. No murmur heard.    No friction rub.  Pulmonary:     Effort: Pulmonary effort is normal.     Breath sounds: Normal breath sounds. No wheezing, rhonchi or rales.  Abdominal:     General: Bowel sounds are normal.     Palpations: Abdomen is soft.     Tenderness: There is no abdominal tenderness.   Musculoskeletal:        General: No deformity.     Right shoulder: Bony tenderness present. Decreased range of motion.     Left shoulder: Normal.     Comments: RUE with 90 degrees of abduction.  Unable to perform neers or hawkins.    Lymphadenopathy:     Cervical: No cervical adenopathy.  Skin:    General: Skin is warm and dry.     Findings: No lesion.  Neurological:     General: No focal deficit present.     Mental Status: She is alert and oriented to person, place, and time.  Psychiatric:        Mood and Affect: Mood normal.        Thought Content: Thought content normal.     No results found for any visits on 12/07/22.    Assessment & Plan:  Encounter for routine adult health examination with abnormal findings -Age-appropriate prevention discussed -Consider immunizations such as shingles, Tdap, pneumonia vaccine -Next CPE in 1 year  Essential hypertension -Controlled -Continue spironolactone 25 mg daily, losartan 100 mg daily -     Comprehensive metabolic panel -     TSH -     T4, free  Mixed hyperlipidemia -Continue Lipitor 40 mg daily -     Hemoglobin A1c -     Lipid panel  Acute pain of right shoulder -Discussed possible causes of symptoms including OA, rotator cuff tear, etc. -Discussed supportive care with topical analgesics, Tylenol, stretching, heat, etc. -Obtain imaging.  Refer to Ortho if needed based on imaging. -     DG Shoulder Right  Class 2 severe obesity with serious comorbidity and body mass index (BMI) of 38.0 to 38.9 in adult, unspecified obesity type (HCC) -Body mass index is 38.19 kg/m. -Lifestyle modifications encouraged -Referral to weight management -     Comprehensive metabolic panel -     VITAMIN D 25 Hydroxy (Vit-D Deficiency, Fractures) -     CBC with Differential/Platelet -     TSH -     T4, free -     Hemoglobin A1c       -     Ambulatory referral to  medical weight management   Return in about 3 months (around 03/09/2023).    Deeann Saint, MD

## 2022-12-08 ENCOUNTER — Other Ambulatory Visit: Payer: Self-pay | Admitting: Family Medicine

## 2022-12-08 DIAGNOSIS — E782 Mixed hyperlipidemia: Secondary | ICD-10-CM

## 2022-12-14 ENCOUNTER — Encounter: Payer: Self-pay | Admitting: Family Medicine

## 2022-12-14 ENCOUNTER — Other Ambulatory Visit: Payer: Self-pay | Admitting: Family Medicine

## 2022-12-14 DIAGNOSIS — M8580 Other specified disorders of bone density and structure, unspecified site: Secondary | ICD-10-CM | POA: Insufficient documentation

## 2022-12-14 DIAGNOSIS — E559 Vitamin D deficiency, unspecified: Secondary | ICD-10-CM

## 2022-12-14 MED ORDER — VITAMIN D (ERGOCALCIFEROL) 1.25 MG (50000 UNIT) PO CAPS
50000.0000 [IU] | ORAL_CAPSULE | ORAL | 0 refills | Status: DC
Start: 2022-12-14 — End: 2023-09-27

## 2022-12-19 ENCOUNTER — Telehealth: Payer: Self-pay | Admitting: Family Medicine

## 2022-12-19 NOTE — Telephone Encounter (Signed)
Patient dropped off document Handicap Placard, to be filled out by provider. Patient requested to send it via Call Patient to pick up within 7-days. Document is located in providers tray at front office.Please advise at Mobile [phone] (mobile)

## 2022-12-24 ENCOUNTER — Other Ambulatory Visit: Payer: Self-pay | Admitting: Family Medicine

## 2022-12-24 DIAGNOSIS — J302 Other seasonal allergic rhinitis: Secondary | ICD-10-CM

## 2022-12-25 ENCOUNTER — Telehealth: Payer: Self-pay | Admitting: Family Medicine

## 2022-12-25 DIAGNOSIS — G4733 Obstructive sleep apnea (adult) (pediatric): Secondary | ICD-10-CM | POA: Diagnosis not present

## 2022-12-25 NOTE — Telephone Encounter (Signed)
Pt's daughter called in wanting to check on the status of a handicap placard form they dropped off to be signed by Dr. Salomon Fick. I did inform her of the 7 business day wait for forms and let her know the form was dropped off on 12/19/22. Pt's daughter stated she dropped off form on 12/15/22. I let her know we would call her once the form was ready to be picked up. She requested msg be sent back to careteam to check on status.

## 2022-12-25 NOTE — Telephone Encounter (Signed)
Form was completed 

## 2022-12-26 NOTE — Telephone Encounter (Signed)
Pt is aware form is completed and will be in file cabinet in front office.

## 2022-12-30 DIAGNOSIS — H52223 Regular astigmatism, bilateral: Secondary | ICD-10-CM | POA: Diagnosis not present

## 2022-12-30 DIAGNOSIS — H524 Presbyopia: Secondary | ICD-10-CM | POA: Diagnosis not present

## 2022-12-30 DIAGNOSIS — Z0100 Encounter for examination of eyes and vision without abnormal findings: Secondary | ICD-10-CM | POA: Diagnosis not present

## 2023-01-08 ENCOUNTER — Encounter: Payer: Self-pay | Admitting: Family Medicine

## 2023-01-08 DIAGNOSIS — M25511 Pain in right shoulder: Secondary | ICD-10-CM

## 2023-01-19 ENCOUNTER — Other Ambulatory Visit: Payer: Self-pay | Admitting: Family Medicine

## 2023-01-19 DIAGNOSIS — K219 Gastro-esophageal reflux disease without esophagitis: Secondary | ICD-10-CM

## 2023-01-24 DIAGNOSIS — G4733 Obstructive sleep apnea (adult) (pediatric): Secondary | ICD-10-CM | POA: Diagnosis not present

## 2023-01-29 ENCOUNTER — Other Ambulatory Visit (INDEPENDENT_AMBULATORY_CARE_PROVIDER_SITE_OTHER): Payer: Medicare HMO

## 2023-01-29 ENCOUNTER — Ambulatory Visit (INDEPENDENT_AMBULATORY_CARE_PROVIDER_SITE_OTHER): Payer: Medicare HMO | Admitting: Orthopedic Surgery

## 2023-01-29 DIAGNOSIS — M79672 Pain in left foot: Secondary | ICD-10-CM

## 2023-01-30 ENCOUNTER — Encounter: Payer: Self-pay | Admitting: Orthopedic Surgery

## 2023-01-30 NOTE — Progress Notes (Signed)
Office Visit Note   Patient: Gina Key           Date of Birth: [birth year]           MRN: [id number] Visit Date: 01/29/2023 Requested by: Deeann Saint, MD [address] [place],  [place] [address] PCP: Deeann Saint, MD  Subjective: Chief Complaint  Patient presents with   Right Shoulder - Pain   Neck - Pain   Left Foot - Pain    HPI: Gina Key is a 77 y.o. female who presents to the office reporting right shoulder pain and neck pain and right foot pain.  Patient reports right shoulder and arm pain for several years.  The pain did subside until she fell in May.  Pain keeps getting worse in the shoulder and neck region.  Has had a subacromial injection with primary care provider which gave her no relief.  Also reports neck pain with some radicular arm pain and numbness and tingling in the fingers.  The pain wakes her from sleep at night.  Also reports decreased range of motion due to her shoulder issues.  She has previously been a patient of the pain clinic but weaned yourself off narcotics.  Currently lives with her daughter.  Patient also reports left foot and heel pain.  Patient states I have a history of bone spurs she reports some burning in her foot.  Most of the pain is on the plantar aspect of the heel.  She does plan on getting better shoes.  Also has a history of bilateral shoulder surgery.  She is very afraid of needles.  Taking over-the-counter medication with minimal relief..                ROS: All systems reviewed are negative as they relate to the chief complaint within the history of present illness.  Patient denies fevers or chills.  Assessment & Plan: Visit Diagnoses:  1. Pain in left foot     Plan: Impression is right arm and shoulder pain.  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  She also had a cervical spine CT scan in 2023 which demonstrated degenerative changes  with moderate right foraminal narrowing at C4-5.  We talked about intra-articular glenohumeral joint injections but she really does not want to do any type of intervention with needles.  We also talked about therapy and stretching as well as surgical replacement of the shoulder.  She does not really want to go the surgical replacement route.  Talked about injections for the neck in case this has a radicular component.  Not really too keen on neck injections.  Regarding the foot I think she does have plantar fasciitis.  Heel cord stretching would be beneficial as well as shoes with better arches.  That she may consider.  She will follow-up as needed.  Follow-Up Instructions: No follow-ups on file.   Orders:  Orders Placed This Encounter  Procedures   XR Foot Complete Left   No orders of the defined types were placed in this encounter.     Procedures: No procedures performed   Clinical Data: No additional findings.  Objective: Vital Signs: There were no vitals taken for this visit.  Physical Exam:  Constitutional: Patient appears well-developed HEENT:  Head: Normocephalic Eyes:EOM are normal Neck: Normal range of motion Cardiovascular: Normal rate Pulmonary/chest: Effort normal Neurologic: Patient is alert Skin: Skin is warm Psychiatric: Patient has  normal mood and affect  Ortho Exam: Ortho exam demonstrates range of motion in the right shoulder of 20/70/90.  Does have a little bit of subjective weakness to rotator cuff strength testing.  Internal and external rotation.  Deltoid does fire.  Motor or sensory function to the arm intact.  Radial pulse intact.  Cervical spine range of motion flexion chin to chest extension 40 degrees rotation is about 60 degrees bilaterally.  Right foot is examined and left foot is examined.  She does have diminished pulses but perfused foot on the left side versus right.  Palpable intact nontender anterior to posterior to peroneal and Achilles tendons.   Does have some pain on the plantar aspect of the left heel but no tenderness on the posterior aspect of the calcaneus at the Achilles insertion.  Heel cord is tight bilaterally.  Specialty Comments:  No specialty comments available.  Imaging: No results found.   PMFS History: Patient Active Problem List   Diagnosis Date Noted   Osteopenia 12/14/2022   Essential hypertension 09/28/2021   Gastroesophageal reflux disease 09/28/2021   Seasonal allergies 09/28/2021   History of migraine 09/28/2021   Urinary incontinence 09/28/2021   Mixed hyperlipidemia 09/28/2021   History of seizure 09/28/2021   OSA (obstructive sleep apnea) 09/28/2021   Past Medical History:  Diagnosis Date   Acid reflux    Allergy    Anxiety    DDD (degenerative disc disease), lumbar    Depression    Facet joint disease    GERD (gastroesophageal reflux disease)    High cholesterol    Hypertension    OSA (obstructive sleep apnea)    Primary osteoarthritis involving multiple joints    Seizures (HCC)    Sleep apnea    C PAP    Family History  Problem Relation Age of Onset   Pancreatic cancer Mother    Thyroid cancer Sister    Cancer Brother        tongue   Seizures Paternal Grandmother    Colon polyps Daughter    Kidney cancer Daughter    Sleep apnea Daughter    Sleep apnea Son    Stroke Son    Seizures Son    Sleep apnea Son    Seizures Grandson    Colon cancer Neg Hx    Crohn's disease Neg Hx    Esophageal cancer Neg Hx    Rectal cancer Neg Hx    Stomach cancer Neg Hx    Ulcerative colitis Neg Hx     Past Surgical History:  Procedure Laterality Date   BREAST BIOPSY     CHOLECYSTECTOMY     left and right rotator cuff     Social History   Occupational History   Not on file  Tobacco Use   Smoking status: Never   Smokeless tobacco: Never  Vaping Use   Vaping Use: Never used  Substance and Sexual Activity   Alcohol use: Never   Drug use: Never   Sexual activity: Not on file

## 2023-02-16 ENCOUNTER — Ambulatory Visit (INDEPENDENT_AMBULATORY_CARE_PROVIDER_SITE_OTHER): Payer: Medicare HMO

## 2023-02-16 VITALS — Ht 62.0 in | Wt 208.0 lb

## 2023-02-16 DIAGNOSIS — Z0000 Encounter for general adult medical examination without abnormal findings: Secondary | ICD-10-CM

## 2023-02-16 DIAGNOSIS — Z23 Encounter for immunization: Secondary | ICD-10-CM

## 2023-02-16 NOTE — Patient Instructions (Addendum)
Gina Key , Thank you for taking time to come for your Medicare Wellness Visit. I appreciate your ongoing commitment to your health goals. Please review the following plan we discussed and let me know if I can assist you in the future.   Referrals/Orders/Follow-Ups/Clinician Recommendations:   This is a list of the screening recommended for you and due dates:  Health Maintenance  Topic Date Due   DTaP/Tdap/Td vaccine (1 - Tdap) Never done   Pneumonia Vaccine (1 of 1 - PCV) 12/07/2023*   COVID-19 Vaccine (5 - 2023-24 season) 12/07/2023*   Zoster (Shingles) Vaccine (1 of 2) 12/07/2023*   Flu Shot  02/22/2023   Medicare Annual Wellness Visit  02/16/2024   DEXA scan (bone density measurement)  Completed   Hepatitis C Screening  Completed   HPV Vaccine  Aged Out   Colon Cancer Screening  Discontinued  *Topic was postponed. The date shown is not the original due date.    Advanced directives: (Declined) Advance directive discussed with you today. Even though you declined this today, please call our office should you change your mind, and we can give you the proper paperwork for you to fill out.  Next Medicare Annual Wellness Visit scheduled for next year: Yes  Preventive Care 74 Years and Older, Female Preventive care refers to lifestyle choices and visits with your health care provider that can promote health and wellness. What does preventive care include? A yearly physical exam. This is also called an annual well check. Dental exams once or twice a year. Routine eye exams. Ask your health care provider how often you should have your eyes checked. Personal lifestyle choices, including: Daily care of your teeth and gums. Regular physical activity. Eating a healthy diet. Avoiding tobacco and drug use. Limiting alcohol use. Practicing safe sex. Taking low-dose aspirin every day. Taking vitamin and mineral supplements as recommended by your health care provider. What happens during an  annual well check? The services and screenings done by your health care provider during your annual well check will depend on your age, overall health, lifestyle risk factors, and family history of disease. Counseling  Your health care provider may ask you questions about your: Alcohol use. Tobacco use. Drug use. Emotional well-being. Home and relationship well-being. Sexual activity. Eating habits. History of falls. Memory and ability to understand (cognition). Work and work Astronomer. Reproductive health. Screening  You may have the following tests or measurements: Height, weight, and BMI. Blood pressure. Lipid and cholesterol levels. These may be checked every 5 years, or more frequently if you are over 32 years old. Skin check. Lung cancer screening. You may have this screening every year starting at age 38 if you have a 30-pack-year history of smoking and currently smoke or have quit within the past 15 years. Fecal occult blood test (FOBT) of the stool. You may have this test every year starting at age 72. Flexible sigmoidoscopy or colonoscopy. You may have a sigmoidoscopy every 5 years or a colonoscopy every 10 years starting at age 35. Hepatitis C blood test. Hepatitis B blood test. Sexually transmitted disease (STD) testing. Diabetes screening. This is done by checking your blood sugar (glucose) after you have not eaten for a while (fasting). You may have this done every 1-3 years. Bone density scan. This is done to screen for osteoporosis. You may have this done starting at age 85. Mammogram. This may be done every 1-2 years. Talk to your health care provider about how often you should  have regular mammograms. Talk with your health care provider about your test results, treatment options, and if necessary, the need for more tests. Vaccines  Your health care provider may recommend certain vaccines, such as: Influenza vaccine. This is recommended every year. Tetanus,  diphtheria, and acellular pertussis (Tdap, Td) vaccine. You may need a Td booster every 10 years. Zoster vaccine. You may need this after age 22. Pneumococcal 13-valent conjugate (PCV13) vaccine. One dose is recommended after age 69. Pneumococcal polysaccharide (PPSV23) vaccine. One dose is recommended after age 6. Talk to your health care provider about which screenings and vaccines you need and how often you need them. This information is not intended to replace advice given to you by your health care provider. Make sure you discuss any questions you have with your health care provider. Document Released: 08/06/2015 Document Revised: 03/29/2016 Document Reviewed: 05/11/2015 Elsevier Interactive Patient Education  2017 [employer].  Fall Prevention in the Home Falls can cause injuries. They can happen to people of all ages. There are many things you can do to make your home safe and to help prevent falls. What can I do on the outside of my home? Regularly fix the edges of walkways and driveways and fix any cracks. Remove anything that might make you trip as you walk through a door, such as a raised step or threshold. Trim any bushes or trees on the path to your home. Use bright outdoor lighting. Clear any walking paths of anything that might make someone trip, such as rocks or tools. Regularly check to see if handrails are loose or broken. Make sure that both sides of any steps have handrails. Any raised decks and porches should have guardrails on the edges. Have any leaves, snow, or ice cleared regularly. Use sand or salt on walking paths during winter. Clean up any spills in your garage right away. This includes oil or grease spills. What can I do in the bathroom? Use night lights. Install grab bars by the toilet and in the tub and shower. Do not use towel bars as grab bars. Use non-skid mats or decals in the tub or shower. If you need to sit down in the shower, use a plastic,  non-slip stool. Keep the floor dry. Clean up any water that spills on the floor as soon as it happens. Remove soap buildup in the tub or shower regularly. Attach bath mats securely with double-sided non-slip rug tape. Do not have throw rugs and other things on the floor that can make you trip. What can I do in the bedroom? Use night lights. Make sure that you have a light by your bed that is easy to reach. Do not use any sheets or blankets that are too big for your bed. They should not hang down onto the floor. Have a firm chair that has side arms. You can use this for support while you get dressed. Do not have throw rugs and other things on the floor that can make you trip. What can I do in the kitchen? Clean up any spills right away. Avoid walking on wet floors. Keep items that you use a lot in easy-to-reach places. If you need to reach something above you, use a strong step stool that has a grab bar. Keep electrical cords out of the way. Do not use floor polish or wax that makes floors slippery. If you must use wax, use non-skid floor wax. Do not have throw rugs and other things on the floor  that can make you trip. What can I do with my stairs? Do not leave any items on the stairs. Make sure that there are handrails on both sides of the stairs and use them. Fix handrails that are broken or loose. Make sure that handrails are as long as the stairways. Check any carpeting to make sure that it is firmly attached to the stairs. Fix any carpet that is loose or worn. Avoid having throw rugs at the top or bottom of the stairs. If you do have throw rugs, attach them to the floor with carpet tape. Make sure that you have a light switch at the top of the stairs and the bottom of the stairs. If you do not have them, ask someone to add them for you. What else can I do to help prevent falls? Wear shoes that: Do not have high heels. Have rubber bottoms. Are comfortable and fit you well. Are closed  at the toe. Do not wear sandals. If you use a stepladder: Make sure that it is fully opened. Do not climb a closed stepladder. Make sure that both sides of the stepladder are locked into place. Ask someone to hold it for you, if possible. Clearly mark and make sure that you can see: Any grab bars or handrails. First and last steps. Where the edge of each step is. Use tools that help you move around (mobility aids) if they are needed. These include: Canes. Walkers. Scooters. Crutches. Turn on the lights when you go into a dark area. Replace any light bulbs as soon as they burn out. Set up your furniture so you have a clear path. Avoid moving your furniture around. If any of your floors are uneven, fix them. If there are any pets around you, be aware of where they are. Review your medicines with your doctor. Some medicines can make you feel dizzy. This can increase your chance of falling. Ask your doctor what other things that you can do to help prevent falls. This information is not intended to replace advice given to you by your health care provider. Make sure you discuss any questions you have with your health care provider. Document Released: 05/06/2009 Document Revised: 12/16/2015 Document Reviewed: 08/14/2014 Elsevier Interactive Patient Education  2017 [employer].

## 2023-02-16 NOTE — Progress Notes (Signed)
Subjective:   Gina Key is a 77 y.o. female who presents for Medicare Annual (Subsequent) preventive examination.  Visit Complete: Virtual  I connected with  Gina Key on 02/16/23 by a audio enabled telemedicine application and verified that I am speaking with the correct person using two identifiers.  Patient Location: Home  Provider Location: Home Office  I discussed the limitations of evaluation and management by telemedicine. The patient expressed understanding and agreed to proceed.  Patient Medicare AWV questionnaire was completed by the patient on ; I have confirmed that all information answered by patient is correct and no changes since this date.  Review of Systems    Vital Signs: Unable to obtain new vitals due to this being a telehealth visit.  Cardiac Risk Factors include: advanced age (>70men, >45 women);hypertension     Objective:    Today's Vitals   02/16/23 1510  Weight: 208 lb (94.3 kg)  Height: 5\' 2"  (1.575 m)   Body mass index is 38.04 kg/m.     02/16/2023    3:19 PM 02/13/2022    4:05 PM 12/11/2021    9:48 AM  Advanced Directives  Does Patient Have a Medical Advance Directive? No No No  Would patient like information on creating a medical advance directive? No - Patient declined No - Patient declined     Current Medications (verified) Outpatient Encounter Medications as of 02/16/2023  Medication Sig   aspirin 325 MG EC tablet Take 325 mg by mouth daily.   atorvastatin (LIPITOR) 40 MG tablet TAKE 1 TABLET (40 MG TOTAL) BY MOUTH DAILY. SCHEDULE ANNUAL VISIT FOR FUTURE REFILLS   Incontinence Supply Disposable (WINGS HL ADULT BRIEFS/XL) MISC As needed daily.   ipratropium (ATROVENT) 0.06 % nasal spray PLACE 2 SPRAYS INTO BOTH NOSTRILS 4 TIMES DAILY.   levETIRAcetam (KEPPRA) 500 MG tablet Take 1 tablet (500 mg total) by mouth 2 (two) times daily.   losartan (COZAAR) 100 MG tablet TAKE 1 TABLET BY MOUTH EVERY DAY   metoprolol succinate  (TOPROL-XL) 25 MG 24 hr tablet TAKE 1 TABLET (25 MG TOTAL) BY MOUTH DAILY.   montelukast (SINGULAIR) 10 MG tablet TAKE 1 TABLET BY MOUTH EVERYDAY AT BEDTIME   pantoprazole (PROTONIX) 20 MG tablet TAKE 1 TABLET BY MOUTH EVERY DAY   potassium chloride (KLOR-CON) 10 MEQ tablet Take 10 mEq by mouth daily.   spironolactone (ALDACTONE) 25 MG tablet TAKE 1 TABLET (25 MG TOTAL) BY MOUTH DAILY.   topiramate (TOPAMAX) 100 MG tablet TAKE 1 TABLET BY MOUTH TWICE A DAY   Vitamin D, Ergocalciferol, (DRISDOL) 1.25 MG (50000 UNIT) CAPS capsule Take 1 capsule (50,000 Units total) by mouth every 7 (seven) days.   No facility-administered encounter medications on file as of 02/16/2023.    Allergies (verified) Patient has no known allergies.   History: Past Medical History:  Diagnosis Date   Acid reflux    Allergy    Anxiety    DDD (degenerative disc disease), lumbar    Depression    Facet joint disease    GERD (gastroesophageal reflux disease)    High cholesterol    Hypertension    OSA (obstructive sleep apnea)    Primary osteoarthritis involving multiple joints    Seizures (HCC)    Sleep apnea    C PAP   Past Surgical History:  Procedure Laterality Date   BREAST BIOPSY     CHOLECYSTECTOMY     left and right rotator cuff     Family  History  Problem Relation Age of Onset   Pancreatic cancer Mother    Thyroid cancer Sister    Cancer Brother        tongue   Seizures Paternal Grandmother    Colon polyps Daughter    Kidney cancer Daughter    Sleep apnea Daughter    Sleep apnea Son    Stroke Son    Seizures Son    Sleep apnea Son    Seizures Grandson    Colon cancer Neg Hx    Crohn's disease Neg Hx    Esophageal cancer Neg Hx    Rectal cancer Neg Hx    Stomach cancer Neg Hx    Ulcerative colitis Neg Hx    Social History   Socioeconomic History   Marital status: Widowed    Spouse name: Not on file   Number of children: 6   Years of education: 45   Highest education level: Not  on file  Occupational History   Not on file  Tobacco Use   Smoking status: Never   Smokeless tobacco: Never  Vaping Use   Vaping status: Never Used  Substance and Sexual Activity   Alcohol use: Never   Drug use: Never   Sexual activity: Not on file  Other Topics Concern   Not on file  Social History Narrative   Lives at home with daughter   Right handed   Caffeine: none    Social Determinants of Health   Financial Resource Strain: Low Risk  (02/16/2023)   Overall Financial Resource Strain (CARDIA)    Difficulty of Paying Living Expenses: Not hard at all  Food Insecurity: No Food Insecurity (02/16/2023)   Hunger Vital Sign    Worried About Running Out of Food in the Last Year: Never true    Ran Out of Food in the Last Year: Never true  Transportation Needs: No Transportation Needs (02/16/2023)   PRAPARE - Administrator, Civil Service (Medical): No    Lack of Transportation (Non-Medical): No  Physical Activity: Inactive (02/16/2023)   Exercise Vital Sign    Days of Exercise per Week: 0 days    Minutes of Exercise per Session: 0 min  Stress: No Stress Concern Present (02/16/2023)   [employer] of Occupational Health - Occupational Stress Questionnaire    Feeling of Stress : Not at all  Social Connections: Moderately Integrated (02/16/2023)   Social Connection and Isolation Panel [NHANES]    Frequency of Communication with Friends and Family: More than three times a week    Frequency of Social Gatherings with Friends and Family: More than three times a week    Attends Religious Services: More than 4 times per year    Active Member of [employer] or Organizations: Yes    Attends Banker Meetings: More than 4 times per year    Marital Status: Widowed    Tobacco Counseling Counseling given: Not Answered   Clinical Intake:  Pre-visit preparation completed: No  Pain : No/denies pain     BMI - recorded: 38.04 Nutritional Status: BMI > 30   Obese Nutritional Risks: None Diabetes: No  How often do you need to have someone help you when you read instructions, pamphlets, or other written materials from your doctor or pharmacy?: 1 - Never  Interpreter Needed?: No  Information entered by :: Theresa Mulligan LPN   Activities of Daily Living    02/16/2023    3:18 PM  In your present state  of health, do you have any difficulty performing the following activities:  Hearing? 0  Vision? 0  Difficulty concentrating or making decisions? 0  Walking or climbing stairs? 0  Dressing or bathing? 0  Doing errands, shopping? 0  Preparing Food and eating ? N  Using the Toilet? N  In the past six months, have you accidently leaked urine? Y  Comment Wears breifs. Followed by PCP  Do you have problems with loss of bowel control? N  Managing your Medications? N  Managing your Finances? N  Housekeeping or managing your Housekeeping? N    Patient Care Team: Deeann Saint, MD as PCP - General (Family Medicine)  Indicate any recent Medical Services you may have received from other than [facility] providers in the past year (date may be approximate).     Assessment:   This is a routine wellness examination for [place].  Hearing/Vision screen Hearing Screening - Comments:: Denies hearing difficulties   Vision Screening - Comments:: Wears rx glasses - up to date with routine eye exams with  [facility]  Dietary issues and exercise activities discussed:     Goals Addressed               This Visit's Progress     Lose weight (pt-stated)         Depression Screen    02/16/2023    3:17 PM 12/07/2022    8:14 AM 05/01/2022   12:23 PM 02/13/2022    3:54 PM 01/05/2022    3:06 PM 10/03/2021   10:13 AM  PHQ 2/9 Scores  PHQ - 2 Score 0 1 0 0 0 0  PHQ- 9 Score  8  0 0 2    Fall Risk    02/16/2023    3:19 PM 12/07/2022    8:14 AM 05/01/2022   12:23 PM 02/13/2022    4:03 PM 01/05/2022    3:00 PM  Fall Risk   Falls in the past year? 0 1  0 1 1  Number falls in past yr: 0 0 0 0 0  Injury with Fall? 0 1 0 0 0  Comment    No injury or medical attention needed   Risk for fall due to : No Fall Risks No Fall Risks Other (Comment) No Fall Risks Other (Comment)  Follow up Falls prevention discussed Falls evaluation completed Falls evaluation completed  Falls prevention discussed    MEDICARE RISK AT HOME:  Medicare Risk at Home - 02/16/23 1524     Any stairs in or around the home? Yes    If so, are there any without handrails? No    Home free of loose throw rugs in walkways, pet beds, electrical cords, etc? Yes    Adequate lighting in your home to reduce risk of falls? Yes    Life alert? No    Use of a cane, walker or w/c? Yes    Grab bars in the bathroom? No    Shower chair or bench in shower? No    Elevated toilet seat or a handicapped toilet? No             TIMED UP AND GO:  Was the test performed?  No    Cognitive Function:        02/16/2023    3:19 PM 02/13/2022    4:06 PM  6CIT Screen  What Year? 0 points 0 points  What month? 0 points 0 points  What time? 0 points 0  points  Count back from 20 0 points 0 points  Months in reverse 0 points 0 points  Repeat phrase 0 points 0 points  Total Score 0 points 0 points    Immunizations Immunization History  Administered Date(s) Administered   Fluad Quad(high Dose 65+) 04/25/2022   Influenza-Unspecified 05/02/2021   Moderna Sars-Covid-2 Vaccination 08/14/2019, 09/19/2019, 05/07/2020, 05/02/2021    TDAP status: Due, Education has been provided regarding the importance of this vaccine. Advised may receive this vaccine at local pharmacy or Health Dept. Aware to provide a copy of the vaccination record if obtained from local pharmacy or Health Dept. Verbalized acceptance and understanding.  Flu Vaccine status: Up to date  Pneumococcal vaccine status: Due, Education has been provided regarding the importance of this vaccine. Advised may receive this vaccine at  local pharmacy or Health Dept. Aware to provide a copy of the vaccination record if obtained from local pharmacy or Health Dept. Verbalized acceptance and understanding.  Covid-19 vaccine status: Completed vaccines  Qualifies for Shingles Vaccine? Yes   Zostavax completed No   Shingrix Completed?: No.    Education has been provided regarding the importance of this vaccine. Patient has been advised to call insurance company to determine out of pocket expense if they have not yet received this vaccine. Advised may also receive vaccine at local pharmacy or Health Dept. Verbalized acceptance and understanding.  Screening Tests Health Maintenance  Topic Date Due   DTaP/Tdap/Td (1 - Tdap) Never done   Pneumonia Vaccine 60+ Years old (1 of 1 - PCV) 12/07/2023 (Originally 11/01/2011)   COVID-19 Vaccine (5 - 2023-24 season) 12/07/2023 (Originally 03/24/2022)   Zoster Vaccines- Shingrix (1 of 2) 12/07/2023 (Originally 10/31/1996)   INFLUENZA VACCINE  02/22/2023   Medicare Annual Wellness (AWV)  02/16/2024   DEXA SCAN  Completed   Hepatitis C Screening  Completed   HPV VACCINES  Aged Out   Colonoscopy  Discontinued    Health Maintenance  Health Maintenance Due  Topic Date Due   DTaP/Tdap/Td (1 - Tdap) Never done    Colorectal cancer screening: No longer required.     Bone Density status: Completed 04/05/22. Results reflect: Bone density results: OSTEOPENIA. Repeat every   years.  Lung Cancer Screening: (Low Dose CT Chest recommended if Age 38-80 years, 20 pack-year currently smoking OR have quit w/in 15years.) does not qualify.     Additional Screening:  Hepatitis C Screening: does qualify; Completed 10/03/21  Vision Screening: Recommended annual ophthalmology exams for early detection of glaucoma and other disorders of the eye. Is the patient up to date with their annual eye exam?  Yes  Who is the provider or what is the name of the office in which the patient attends annual eye exams?  [facility] If pt is not established with a provider, would they like to be referred to a provider to establish care? No .   Dental Screening: Recommended annual dental exams for proper oral hygiene    Community Resource Referral / Chronic Care Management:  CRR required this visit?  No   CCM required this visit?  No     Plan:     I have personally reviewed and noted the following in the patient's chart:   Medical and social history Use of alcohol, tobacco or illicit drugs  Current medications and supplements including opioid prescriptions. Patient is not currently taking opioid prescriptions. Functional ability and status Nutritional status Physical activity Advanced directives List of other physicians Hospitalizations, surgeries, and ER  visits in previous 12 months Vitals Screenings to include cognitive, depression, and falls Referrals and appointments  In addition, I have reviewed and discussed with patient certain preventive protocols, quality metrics, and best practice recommendations. A written personalized care plan for preventive services as well as general preventive health recommendations were provided to patient.     Tillie Rung, LPN   [id number]   After Visit Summary: (MyChart) Due to this being a telephonic visit, the after visit summary with patients personalized plan was offered to patient via MyChart   Nurse Notes: None

## 2023-03-03 ENCOUNTER — Other Ambulatory Visit: Payer: Self-pay | Admitting: Family Medicine

## 2023-03-03 DIAGNOSIS — I10 Essential (primary) hypertension: Secondary | ICD-10-CM

## 2023-03-05 ENCOUNTER — Ambulatory Visit (INDEPENDENT_AMBULATORY_CARE_PROVIDER_SITE_OTHER): Payer: Medicare HMO | Admitting: Family Medicine

## 2023-03-05 ENCOUNTER — Encounter: Payer: Self-pay | Admitting: Family Medicine

## 2023-03-05 ENCOUNTER — Other Ambulatory Visit: Payer: Self-pay | Admitting: Family Medicine

## 2023-03-05 VITALS — BP 120/76 | HR 72 | Temp 97.8°F | Wt 208.6 lb

## 2023-03-05 DIAGNOSIS — E559 Vitamin D deficiency, unspecified: Secondary | ICD-10-CM

## 2023-03-05 DIAGNOSIS — R413 Other amnesia: Secondary | ICD-10-CM | POA: Diagnosis not present

## 2023-03-05 NOTE — Progress Notes (Signed)
Established Patient Office Visit   Subjective  Patient ID: Gina Key, female    DOB: [birth year]  Age: 77 y.o. MRN: [id number]  Chief Complaint  Patient presents with   Hypertension     Follow-up   Pt accompanied by her daughter.  Patient 77 old female seen for follow-up.  Per patient's daughter pt sister has concerns regarding patient's memory.  Patient's daughter is unsure if she has noticed any changes.  Pt is currently not responsible for paying bills at home, she does not drive, and has not forgotten how to do things.  Patient inquires about weight loss medications.    Past Medical History:  Diagnosis Date   Acid reflux    Allergy    Anxiety    DDD (degenerative disc disease), lumbar    Depression    Facet joint disease    GERD (gastroesophageal reflux disease)    High cholesterol    Hypertension    OSA (obstructive sleep apnea)    Primary osteoarthritis involving multiple joints    Seizures (HCC)    Sleep apnea    C PAP   Past Surgical History:  Procedure Laterality Date   BREAST BIOPSY     CHOLECYSTECTOMY     left and right rotator cuff     Social History   Tobacco Use   Smoking status: Never   Smokeless tobacco: Never  Vaping Use   Vaping status: Never Used  Substance Use Topics   Alcohol use: Never   Drug use: Never   Family History  Problem Relation Age of Onset   Pancreatic cancer Mother    Thyroid cancer Sister    Cancer Brother        tongue   Seizures Paternal Grandmother    Colon polyps Daughter    Kidney cancer Daughter    Sleep apnea Daughter    Sleep apnea Son    Stroke Son    Seizures Son    Sleep apnea Son    Seizures Grandson    Colon cancer Neg Hx    Crohn's disease Neg Hx    Esophageal cancer Neg Hx    Rectal cancer Neg Hx    Stomach cancer Neg Hx    Ulcerative colitis Neg Hx    No Known Allergies    ROS Negative unless stated above    Objective:     BP 120/76 (BP Location: Right Arm, Patient Position:  Sitting, Cuff Size: Normal)   Pulse 72   Temp 97.8 F (36.6 C) (Oral)   Wt 208 lb 9.6 oz (94.6 kg)   SpO2 93%   BMI 38.15 kg/m    Physical Exam Constitutional:      General: She is not in acute distress.    Appearance: Normal appearance.  HENT:     Head: Normocephalic and atraumatic.     Nose: Nose normal.     Mouth/Throat:     Mouth: Mucous membranes are moist.  Cardiovascular:     Rate and Rhythm: Normal rate and regular rhythm.     Heart sounds: Normal heart sounds. No murmur heard.    No gallop.  Pulmonary:     Effort: Pulmonary effort is normal. No respiratory distress.     Breath sounds: Normal breath sounds. No wheezing, rhonchi or rales.  Skin:    General: Skin is warm and dry.  Neurological:     Mental Status: She is alert. She is disoriented and confused.     Cranial  Nerves: Cranial nerves 2-12 are intact.     Gait: Gait is intact.       03/05/2023    5:20 PM  Montreal Cognitive Assessment   Visuospatial/ Executive (0/5) 1  Naming (0/3) 2  Attention: Read list of digits (0/2) 2  Attention: Read list of letters (0/1) 1  Attention: Serial 7 subtraction starting at 100 (0/3) 2  Language: Repeat phrase (0/2) 0  Language : Fluency (0/1) 0  Abstraction (0/2) 0  Delayed Recall (0/5) 0  Orientation (0/6) 4  Total 12  Adjusted Score (based on education) 13     No results found for any visits on 03/05/23.    Assessment & Plan:  Memory change -     Ambulatory referral to Neurology -     Vitamin B12 -     Folate -     CBC with Differential/Platelet -     Comprehensive metabolic panel -     TSH -     T4, free -     VITAMIN D 25 Hydroxy (Vit-D Deficiency, Fractures) -     Neuropsychological testing; Future -     CT HEAD WO CONTRAST ( ); Future  Vitamin D deficiency -     VITAMIN D 25 Hydroxy (Vit-D Deficiency, Fractures)  Ongoing memory concerns and pt.  MoCA score 13 this visit.  Discussed obtaining labs to evaluate for reversible causes of memory  change.  Orders for formal neuropsych testing, CT head, and neurology referral placed.  Further recommendations based on results.   On day of service, 33 minutes spent caring for this patient face-to-face, reviewing the chart, counseling and/or coordinating care for plan and treatment of diagnosis below.     Return in about 2 months (around 05/05/2023), or if symptoms worsen or fail to improve.   Deeann Saint, MD

## 2023-03-06 ENCOUNTER — Encounter: Payer: Self-pay | Admitting: Family Medicine

## 2023-03-07 ENCOUNTER — Other Ambulatory Visit: Payer: Self-pay | Admitting: Family Medicine

## 2023-03-07 DIAGNOSIS — I10 Essential (primary) hypertension: Secondary | ICD-10-CM

## 2023-03-07 DIAGNOSIS — J302 Other seasonal allergic rhinitis: Secondary | ICD-10-CM

## 2023-03-07 DIAGNOSIS — Z8669 Personal history of other diseases of the nervous system and sense organs: Secondary | ICD-10-CM

## 2023-03-07 NOTE — Telephone Encounter (Signed)
Vitamin D now normal based on recent labs.  No refill of high-dose vitamin D needed.  Pt can take over-the-counter vitamin D 1000-2000 IUs daily to help maintain levels.

## 2023-03-09 ENCOUNTER — Ambulatory Visit: Payer: Medicare HMO | Admitting: Family Medicine

## 2023-03-12 ENCOUNTER — Telehealth: Payer: Self-pay | Admitting: Orthopedic Surgery

## 2023-03-12 NOTE — Telephone Encounter (Signed)
Called and advised. Pt and daughter stated understanding

## 2023-03-12 NOTE — Telephone Encounter (Signed)
Patient's daughter Kathie Rhodes called advised patient needed to know if there is another procedure other than a total shoulder replacement that Dr. Dr. August Saucer can do? The number to [phone]

## 2023-03-12 NOTE — Telephone Encounter (Signed)
Arthroscopy will not be helpful for the shoulder.  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  There is really no other procedure that is going to give predictable pain relief outside of shoulder replacement.  Alternatively she can get episodic injections in the shoulder depending on how they help her.  Please call thanks

## 2023-03-13 ENCOUNTER — Encounter: Payer: Self-pay | Admitting: Family Medicine

## 2023-03-16 ENCOUNTER — Emergency Department (HOSPITAL_COMMUNITY)
Admission: EM | Admit: 2023-03-16 | Discharge: 2023-03-16 | Disposition: A | Discharge status: Home / Self Care | Payer: Medicare HMO | Attending: Emergency Medicine | Admitting: Emergency Medicine

## 2023-03-16 ENCOUNTER — Emergency Department (HOSPITAL_COMMUNITY): Payer: Medicare HMO

## 2023-03-16 ENCOUNTER — Ambulatory Visit
Admission: RE | Admit: 2023-03-16 | Discharge: 2023-03-16 | Disposition: A | Discharge status: Home / Self Care | Payer: Medicare HMO | Source: Ambulatory Visit | Attending: Family Medicine | Admitting: Family Medicine

## 2023-03-16 ENCOUNTER — Other Ambulatory Visit: Payer: Self-pay

## 2023-03-16 ENCOUNTER — Encounter (HOSPITAL_COMMUNITY): Payer: Self-pay | Admitting: *Deleted

## 2023-03-16 DIAGNOSIS — G4733 Obstructive sleep apnea (adult) (pediatric): Secondary | ICD-10-CM | POA: Insufficient documentation

## 2023-03-16 DIAGNOSIS — E785 Hyperlipidemia, unspecified: Secondary | ICD-10-CM | POA: Diagnosis not present

## 2023-03-16 DIAGNOSIS — R0602 Shortness of breath: Secondary | ICD-10-CM | POA: Diagnosis not present

## 2023-03-16 DIAGNOSIS — D649 Anemia, unspecified: Secondary | ICD-10-CM | POA: Diagnosis not present

## 2023-03-16 DIAGNOSIS — I6782 Cerebral ischemia: Secondary | ICD-10-CM | POA: Diagnosis not present

## 2023-03-16 DIAGNOSIS — Z7982 Long term (current) use of aspirin: Secondary | ICD-10-CM | POA: Insufficient documentation

## 2023-03-16 DIAGNOSIS — R42 Dizziness and giddiness: Secondary | ICD-10-CM

## 2023-03-16 DIAGNOSIS — R531 Weakness: Secondary | ICD-10-CM | POA: Diagnosis not present

## 2023-03-16 DIAGNOSIS — R413 Other amnesia: Secondary | ICD-10-CM | POA: Diagnosis not present

## 2023-03-16 DIAGNOSIS — I10 Essential (primary) hypertension: Secondary | ICD-10-CM | POA: Diagnosis not present

## 2023-03-16 DIAGNOSIS — Z79899 Other long term (current) drug therapy: Secondary | ICD-10-CM | POA: Insufficient documentation

## 2023-03-16 DIAGNOSIS — R519 Headache, unspecified: Secondary | ICD-10-CM | POA: Diagnosis not present

## 2023-03-16 LAB — I-STAT CHEM 8, ED
BUN: 12 mg/dL (ref 8–23)
Calcium, Ion: 1.26 mmol/L (ref 1.15–1.40)
Chloride: 113 mmol/L — ABNORMAL HIGH (ref 98–111)
Creatinine, Ser: 1 mg/dL (ref 0.44–1.00)
Glucose, Bld: 90 mg/dL (ref 70–99)
HCT: 34 % — ABNORMAL LOW (ref 36.0–46.0)
Hemoglobin: 11.6 g/dL — ABNORMAL LOW (ref 12.0–15.0)
Potassium: 3.7 mmol/L (ref 3.5–5.1)
Sodium: 146 mmol/L — ABNORMAL HIGH (ref 135–145)
TCO2: 20 mmol/L — ABNORMAL LOW (ref 22–32)

## 2023-03-16 LAB — PROTIME-INR
INR: 1.1 (ref 0.8–1.2)
Prothrombin Time: 14 seconds (ref 11.4–15.2)

## 2023-03-16 LAB — DIFFERENTIAL
Abs Immature Granulocytes: 0.02 10*3/uL (ref 0.00–0.07)
Basophils Absolute: 0 10*3/uL (ref 0.0–0.1)
Basophils Relative: 1 %
Eosinophils Absolute: 0.1 10*3/uL (ref 0.0–0.5)
Eosinophils Relative: 2 %
Immature Granulocytes: 0 %
Lymphocytes Relative: 35 %
Lymphs Abs: 2.9 10*3/uL (ref 0.7–4.0)
Monocytes Absolute: 0.5 10*3/uL (ref 0.1–1.0)
Monocytes Relative: 5 %
Neutro Abs: 4.9 10*3/uL (ref 1.7–7.7)
Neutrophils Relative %: 57 %

## 2023-03-16 LAB — COMPREHENSIVE METABOLIC PANEL
ALT: 18 U/L (ref 0–44)
AST: 24 U/L (ref 15–41)
Albumin: 3.7 g/dL (ref 3.5–5.0)
Alkaline Phosphatase: 111 U/L (ref 38–126)
Anion gap: 7 (ref 5–15)
BUN: 11 mg/dL (ref 8–23)
CO2: 21 mmol/L — ABNORMAL LOW (ref 22–32)
Calcium: 8.9 mg/dL (ref 8.9–10.3)
Chloride: 113 mmol/L — ABNORMAL HIGH (ref 98–111)
Creatinine, Ser: 0.92 mg/dL (ref 0.44–1.00)
GFR, Estimated: >60 mL/min (ref >60)
Glucose, Bld: 116 mg/dL — ABNORMAL HIGH (ref 70–99)
Potassium: 3.6 mmol/L (ref 3.5–5.1)
Sodium: 141 mmol/L (ref 135–145)
Total Bilirubin: 0.4 mg/dL (ref 0.3–1.2)
Total Protein: 6.4 g/dL — ABNORMAL LOW (ref 6.5–8.1)

## 2023-03-16 LAB — CBC
HCT: 38.5 % (ref 36.0–46.0)
Hemoglobin: 11.6 g/dL — ABNORMAL LOW (ref 12.0–15.0)
MCH: 26.2 pg (ref 26.0–34.0)
MCHC: 30.1 g/dL (ref 30.0–36.0)
MCV: 87.1 fL (ref 80.0–100.0)
Platelets: 194 10*3/uL (ref 150–400)
RBC: 4.42 MIL/uL (ref 3.87–5.11)
RDW: 12.9 % (ref 11.5–15.5)
WBC: 8.5 10*3/uL (ref 4.0–10.5)
nRBC: 0 % (ref 0.0–0.2)

## 2023-03-16 LAB — ETHANOL: Alcohol, Ethyl (B): <10 mg/dL (ref <10)

## 2023-03-16 LAB — APTT: aPTT: 28 seconds (ref 24–36)

## 2023-03-16 LAB — BRAIN NATRIURETIC PEPTIDE: B Natriuretic Peptide: 31.2 pg/mL (ref 0.0–100.0)

## 2023-03-16 LAB — TROPONIN I (HIGH SENSITIVITY): Troponin I (High Sensitivity): 7 ng/L (ref <18)

## 2023-03-16 MED ORDER — SODIUM CHLORIDE 0.9% FLUSH: 3.0000 mL | Freq: Once | INTRAVENOUS | Status: DC

## 2023-03-16 MED ORDER — MECLIZINE HCL 25 MG PO TABS
12.5000 mg | ORAL_TABLET | Freq: Once | ORAL | Status: AC
  Administered 2023-03-16: 12.5 mg via ORAL
  Filled 2023-03-16: qty 1

## 2023-03-16 NOTE — ED Notes (Signed)
Ambulated the pt in the hall. Pt was utilizing her cane. Pt was able to walk for a few minutes in the hall without incident. When the pt was returning to her room. She had to stop because she was having a spell where she felt light-headed and her right leg felt weak. Dr Adela Lank made aware.

## 2023-03-16 NOTE — ED Provider Notes (Signed)
Care of the patient received from Ro[place] PA-C.  See her note for full HPI.  In short, 77 year old female history of hypertension, GERD, migraines, hyperlipidemia, OSA who presented with complaints of dizziness and memory issues.  She had an outpatient CT scan done with her PCP this morning which did not show any acute abnormality.  Patient also complained of feeling like her right side was weaker than her left.  She also endorsed some vague symptoms such as shortness of breath and fatigue with exertion, no chest pain, no syncope.  Workup by prior provider revealed elevated sodium on i-STAT however normal on CMP of 141, CO2 of 21 without anion gap elevation.  No unilateral weakness appreciated on exam.  CBC with a stable anemia of 11.6.  Normal PT PTT.  EKG reviewed, normal sinus rhythm with a heart rate of 63.  Patient received a small dose of meclizine.  Care signed out pending chest x-ray and attempting to ambulate the patient.  X-ray reviewed, growth radiology read, negative for acute findings.  BNP normal.  She was ambulated, did not have symptoms while walking though once she sat down she did state that she felt a little bit dizzy.  No evidence of volume overload.  Discussed with daughter and patient at bedside, would like to benefit from an outpatient MRI but no emergent indication at present based on workup and physical exam.  They are agreeable to this.  They will follow-up closely with her PCP.  We discussed return precautions.  There voice understanding and are agreeable.  Stable for discharge.  Results for orders placed or performed during the hospital encounter of 03/16/23  Protime-INR  Result Value Ref Range   Prothrombin Time 14.0 11.4 - 15.2 seconds   INR 1.1 0.8 - 1.2  APTT  Result Value Ref Range   aPTT 28 24 - 36 seconds  CBC  Result Value Ref Range   WBC 8.5 4.0 - 10.5 K/uL   RBC 4.42 3.87 - 5.11 MIL/uL   Hemoglobin 11.6 (L) 12.0 - 15.0 g/dL   HCT 28.4 13.2 - 44.0 %   MCV  87.1 80.0 - 100.0 fL   MCH 26.2 26.0 - 34.0 pg   MCHC 30.1 30.0 - 36.0 g/dL   RDW 10.2 72.5 - 36.6 %   Platelets 194 150 - 400 K/uL   nRBC 0.0 0.0 - 0.2 %  Differential  Result Value Ref Range   Neutrophils Relative % 57 %   Neutro Abs 4.9 1.7 - 7.7 K/uL   Lymphocytes Relative 35 %   Lymphs Abs 2.9 0.7 - 4.0 K/uL   Monocytes Relative 5 %   Monocytes Absolute 0.5 0.1 - 1.0 K/uL   Eosinophils Relative 2 %   Eosinophils Absolute 0.1 0.0 - 0.5 K/uL   Basophils Relative 1 %   Basophils Absolute 0.0 0.0 - 0.1 K/uL   Immature Granulocytes 0 %   Abs Immature Granulocytes 0.02 0.00 - 0.07 K/uL  Comprehensive metabolic panel  Result Value Ref Range   Sodium 141 135 - 145 mmol/L   Potassium 3.6 3.5 - 5.1 mmol/L   Chloride 113 (H) 98 - 111 mmol/L   CO2 21 (L) 22 - 32 mmol/L   Glucose, Bld 116 (H) 70 - 99 mg/dL   BUN 11 8 - 23 mg/dL   Creatinine, Ser 4.40 0.44 - 1.00 mg/dL   Calcium 8.9 8.9 - 34.7 mg/dL   Total Protein 6.4 (L) 6.5 - 8.1 g/dL   Albumin 3.7  3.5 - 5.0 g/dL   AST 24 15 - 41 U/L   ALT 18 0 - 44 U/L   Alkaline Phosphatase 111 38 - 126 U/L   Total Bilirubin 0.4 0.3 - 1.2 mg/dL   GFR, Estimated >91 >47 mL/min   Anion gap 7 5 - 15  Ethanol  Result Value Ref Range   Alcohol, Ethyl (B) <10 <10 mg/dL  Brain natriuretic peptide  Result Value Ref Range   B Natriuretic Peptide 31.2 0.0 - 100.0 pg/mL  I-stat chem 8, ED  Result Value Ref Range   Sodium 146 (H) 135 - 145 mmol/L   Potassium 3.7 3.5 - 5.1 mmol/L   Chloride 113 (H) 98 - 111 mmol/L   BUN 12 8 - 23 mg/dL   Creatinine, Ser 8.29 0.44 - 1.00 mg/dL   Glucose, Bld 90 70 - 99 mg/dL   Calcium, Ion 5.62 1.30 - 1.40 mmol/L   TCO2 20 (L) 22 - 32 mmol/L   Hemoglobin 11.6 (L) 12.0 - 15.0 g/dL   HCT 86.5 (L) 78.4 - 69.6 %  Troponin I (High Sensitivity)  Result Value Ref Range   Troponin I (High Sensitivity) 7 <18 ng/L   DG Chest 2 View  Result Date: 03/16/2023 CLINICAL DATA:  Shortness of breath. EXAM: CHEST - 2 VIEW  COMPARISON:  None Available. FINDINGS: The heart size and mediastinal contours are within normal limits. Both lungs are clear. The visualized skeletal structures are unremarkable. IMPRESSION: No active cardiopulmonary disease. Electronically Signed   By: Lupita Raider M.D.   On: 03/16/2023 16:21   CT HEAD WO CONTRAST ( )  Result Date: 03/16/2023 CLINICAL DATA:  Memory loss. Headaches, changes in hearing and speech, and tingling in the fingers and feet. Dizziness and weakness. Subjective right-sided weakness. EXAM: CT HEAD WITHOUT CONTRAST TECHNIQUE: Contiguous axial images were obtained from the base of the skull through the vertex without intravenous contrast. RADIATION DOSE REDUCTION: This exam was performed according to the departmental dose-optimization program which includes automated exposure control, adjustment of the mA and/or kV according to patient size and/or use of iterative reconstruction technique. COMPARISON:  Head CT 12/11/2021 FINDINGS: Brain: There is no evidence of an acute infarct, intracranial hemorrhage, mass, midline shift, or extra-axial fluid collection. The ventricles and sulci are normal without evidence of age advanced or lobar predominant atrophy. Scattered cerebral white matter hypodensities are stable to slightly more prominent than on the prior study and are nonspecific but compatible with mild chronic small vessel ischemic disease. A partially empty sella is again seen. Vascular: Calcified atherosclerosis at the skull base. No hyperdense vessel. Skull: No acute fracture or suspicious osseous lesion. Sinuses/Orbits: Included paranasal sinuses and mastoid air cells are clear. Bilateral cataract extraction. Other: None. IMPRESSION: 1. No evidence of acute intracranial abnormality. 2. Mild chronic small vessel ischemic disease. Electronically Signed   By: Sebastian Ache M.D.   On: 03/16/2023 14:02      Mare Ferrari, PA-C 03/16/23 1751    Melene Plan, DO 03/16/23 1839

## 2023-03-16 NOTE — ED Triage Notes (Signed)
C/o dizziness with weakness  last known well 8am. Bilateral grips strong and equal however she states she feels weaker on the right. Patient had a CT Scan done this per family member for memory problems.

## 2023-03-16 NOTE — Discharge Instructions (Signed)
You were evaluated in the Emergency Department and after careful evaluation, we did not find any emergent condition requiring admission or further testing in the hospital.  Workup today was overall reassuring.  Please make sure to follow-up with your primary care doctor, you could benefit from an outpatient MRI or starting the antidizziness medicine that you were given today on an outpatient basis.  Please return to the Emergency Department if you experience any worsening of your condition.  We encourage you to follow up with a primary care provider.  Thank you for allowing [place] to be a part of your care.

## 2023-03-16 NOTE — ED Provider Notes (Signed)
[facility] EMERGENCY DE[facility]MENT AT [facility] Provider Note   CSN: [id number] Arrival date & time: 03/16/23  1240     History  Chief Complaint  Patient presents with   Dizziness   Weakness    Gina Key is a 77 y.o. female with history of hypertension, GERD, migraines, hyperlipidemia, OSA, who presents emergency department with concern for dizziness.  Patient been having some memory problems and had an outpatient CT scan done per her PCP this morning.  Her daughter had called her PCP explained the patient was experiencing some dizziness and generalized weakness.  Patient is a poor historian due to her memory complaint, but believes that her symptoms may have started this morning.  She had her CT scan done around 8 AM.  Patient also felt that her right side was weaker than her left, and communicated this to triage staff, but does not complain about this to me.  She did have a headache which has resolved.  She was able to close her eyes and get some rest when she first got to the ER, thinks that this helped her symptoms.  She also endorses some vague shortness of breath and fatigue with exertion recently, no chest pain.  No syncope.   Dizziness Associated symptoms: headaches, shortness of breath and weakness   Weakness Associated symptoms: dizziness, headaches and shortness of breath        Home Medications Prior to Admission medications   Medication Sig Start Date End Date Taking? Authorizing Provider  aspirin 325 MG EC tablet Take 325 mg by mouth daily.    [provider]  atorvastatin (LIPITOR) 40 MG tablet TAKE 1 TABLET (40 MG TOTAL) BY MOUTH DAILY. SCHEDULE ANNUAL VISIT FOR FUTURE REFILLS 12/08/22   Deeann Saint, MD  Incontinence Supply Disposable (WINGS HL ADULT BRIEFS/XL) MISC As needed daily. 10/03/21   Deeann Saint, MD  ipratropium (ATROVENT) 0.06 % nasal spray PLACE 2 SPRAYS INTO BOTH NOSTRILS 4 TIMES DAILY. 12/25/22   Deeann Saint, MD   levETIRAcetam (KEPPRA) 500 MG tablet Take 1 tablet (500 mg total) by mouth 2 (two) times daily. 10/17/22   Glean Salvo, NP  losartan (COZAAR) 100 MG tablet TAKE 1 TABLET BY MOUTH EVERY DAY 03/05/23   Deeann Saint, MD  metoprolol succinate (TOPROL-XL) 25 MG 24 hr tablet TAKE 1 TABLET (25 MG TOTAL) BY MOUTH DAILY. 03/07/23   Deeann Saint, MD  montelukast (SINGULAIR) 10 MG tablet TAKE 1 TABLET BY MOUTH EVERYDAY AT BEDTIME 03/07/23   Deeann Saint, MD  pantoprazole (PROTONIX) 20 MG tablet TAKE 1 TABLET BY MOUTH EVERY DAY 01/19/23   Deeann Saint, MD  potassium chloride (KLOR-CON) 10 MEQ tablet Take 10 mEq by mouth daily.    [provider]  spironolactone (ALDACTONE) 25 MG tablet TAKE 1 TABLET (25 MG TOTAL) BY MOUTH DAILY. 03/07/23   Deeann Saint, MD  topiramate (TOPAMAX) 100 MG tablet TAKE 1 TABLET BY MOUTH TWICE A DAY 03/07/23   Deeann Saint, MD  Vitamin D, Ergocalciferol, (DRISDOL) 1.25 MG (50000 UNIT) CAPS capsule Take 1 capsule (50,000 Units total) by mouth every 7 (seven) days. 12/14/22   Deeann Saint, MD      Allergies    Patient has no known allergies.    Review of Systems   Review of Systems  Constitutional:  Positive for fatigue.  Respiratory:  Positive for shortness of breath.   Neurological:  Positive for dizziness, weakness and  headaches.  All other systems reviewed and are negative.   Physical Exam Updated Vital Signs BP (!) 155/66   Pulse 66   Temp 97.6 F (36.4 C) (Oral)   Resp 18   Ht 5\' 2"  (1.575 m)   Wt 93 kg   SpO2 100%   BMI 37.49 kg/m  Physical Exam Vitals and nursing note reviewed.  Constitutional:      Appearance: Normal appearance.  HENT:     Head: Normocephalic and atraumatic.  Eyes:     Conjunctiva/sclera: Conjunctivae normal.  Cardiovascular:     Rate and Rhythm: Normal rate and regular rhythm.  Pulmonary:     Effort: Pulmonary effort is normal. No respiratory distress.     Breath sounds: Normal breath sounds.   Abdominal:     General: There is no distension.     Palpations: Abdomen is soft.     Tenderness: There is no abdominal tenderness.  Skin:    General: Skin is warm and dry.  Neurological:     General: No focal deficit present.     Mental Status: She is alert.     Comments: Neuro: Speech is clear, able to follow commands. CN III-XII intact grossly intact. PERRLA. EOMI. Sensation intact throughout. Str 5/5 all extremities.     ED Results / Procedures / Treatments   Labs (all labs ordered are listed, but only abnormal results are displayed) Labs Reviewed  CBC - Abnormal; Notable for the following components:      Result Value   Hemoglobin 11.6 (*)    All other components within normal limits  COMPREHENSIVE METABOLIC PANEL - Abnormal; Notable for the following components:   Chloride 113 (*)    CO2 21 (*)    Glucose, Bld 116 (*)    Total Protein 6.4 (*)    All other components within normal limits  I-STAT CHEM 8, ED - Abnormal; Notable for the following components:   Sodium 146 (*)    Chloride 113 (*)    TCO2 20 (*)    Hemoglobin 11.6 (*)    HCT 34.0 (*)    All other components within normal limits  PROTIME-INR  APTT  DIFFERENTIAL  ETHANOL  CBG MONITORING, ED    EKG EKG Interpretation Date/Time:  Friday March 16 2023 12:30:02 EDT Ventricular Rate:  63 PR Interval:  206 QRS Duration:  80 QT Interval:  410 QTC Calculation: 419 R Axis:   -11  Text Interpretation: Normal sinus rhythm Low voltage QRS Borderline ECG No previous ECGs available Confirmed by Jacalyn Lefevre [phone]) on 03/16/2023 2:40:43 PM  Radiology CT HEAD WO CONTRAST ( )  Result Date: 03/16/2023 CLINICAL DATA:  Memory loss. Headaches, changes in hearing and speech, and tingling in the fingers and feet. Dizziness and weakness. Subjective right-sided weakness. EXAM: CT HEAD WITHOUT CONTRAST TECHNIQUE: Contiguous axial images were obtained from the base of the skull through the vertex without intravenous  contrast. RADIATION DOSE REDUCTION: This exam was performed according to the departmental dose-optimization program which includes automated exposure control, adjustment of the mA and/or kV according to patient size and/or use of iterative reconstruction technique. COMPARISON:  Head CT 12/11/2021 FINDINGS: Brain: There is no evidence of an acute infarct, intracranial hemorrhage, mass, midline shift, or extra-axial fluid collection. The ventricles and sulci are normal without evidence of age advanced or lobar predominant atrophy. Scattered cerebral white matter hypodensities are stable to slightly more prominent than on the prior study and are nonspecific but compatible with mild chronic small  vessel ischemic disease. A partially empty sella is again seen. Vascular: Calcified atherosclerosis at the skull base. No hyperdense vessel. Skull: No acute fracture or suspicious osseous lesion. Sinuses/Orbits: Included paranasal sinuses and mastoid air cells are clear. Bilateral cataract extraction. Other: None. IMPRESSION: 1. No evidence of acute intracranial abnormality. 2. Mild chronic small vessel ischemic disease. Electronically Signed   By: Sebastian Ache M.D.   On: 03/16/2023 14:02    Procedures Procedures    Medications Ordered in ED Medications  sodium chloride flush (NS) 0.9 % injection 3 mL (has no administration in time range)  meclizine (ANTIVERT) tablet 12.5 mg (has no administration in time range)    ED Course/ Medical Decision Making/ A&P                                 Medical Decision Making Amount and/or Complexity of Data Reviewed Labs: ordered. Radiology: ordered.  This patient is a 77 y.o. female  who presents to the ED for concern of dizziness.   Differential diagnoses prior to evaluation: The emergent differential diagnosis includes, but is not limited to,  BPPV, vestibular migraine, head trauma, AVM, intracranial tumor, multiple sclerosis, drug-related, CVA, vasovagal syncope,  orthostatic hypotension, sepsis, hypoglycemia, electrolyte disturbance, anemia, anxiety/panic attack. This is not an exhaustive differential.   Past Medical History / Co-morbidities / Social History: hypertension, GERD, migraines, hyperlipidemia, OSA  Additional history: Chart reviewed. Pertinent results include: Reviewed outpatient CT of the head performed earlier today, showed no evidence of acute intracranial abnormality, some mild chronic small vessel ischemic disease.  Physical Exam: Physical exam performed. The pertinent findings include:   Lab Tests/Imaging studies: I personally interpreted labs/imaging and the pertinent results include:  CBC with stable hemoglobin, no leukocytosis.  CMP grossly at baseline.  Did not repeat CT head as it was performed this morning.  Chest x-ray pending.  Cardiac monitoring: EKG obtained and interpreted by myself and attending physician which shows: Sinus rhythm, low voltage QRS   Medications: I ordered medication including low-dose meclizine.  I have reviewed the patients home medicines and have made adjustments as needed.   Disposition: Patient discussed and care transferred to [facility] at shift change. Please see his/her note for further details regarding further ED course and disposition. Plan at time of handoff is follow up on CXR. Pt endorses some progressive fatigue and shortness of breath with ambulation.  Although she does not appear volume overloaded, will obtain chest x-ray to further run a workup, specifically looking for any pulmonary edema or evidence of CHF.  Also plan to ambulate the patient after small dose meclizine.  Patient and her mother are agreeable to this plan.  Anticipate patient can discharged home with PCP follow-up.  Final Clinical Impression(s) / ED Diagnoses Final diagnoses:  Dizziness    Rx / DC Orders ED Discharge Orders     None      Portions of this report may have been transcribed using voice  recognition software. Every effort was made to ensure accuracy; however, inadvertent computerized transcription errors may be present.    Jeanella Flattery 03/16/23 1513    Jacalyn Lefevre, MD 03/18/23 [phone]

## 2023-03-19 ENCOUNTER — Encounter: Payer: Self-pay | Admitting: Neurology

## 2023-03-19 ENCOUNTER — Encounter: Payer: Self-pay | Admitting: Family Medicine

## 2023-03-19 NOTE — Telephone Encounter (Signed)
These are chronic changes that can occur as we get older.  Controlling blood pressure and cholesterol are ways to help slow the progression.

## 2023-03-28 ENCOUNTER — Encounter: Payer: Self-pay | Admitting: Family Medicine

## 2023-03-28 ENCOUNTER — Ambulatory Visit (INDEPENDENT_AMBULATORY_CARE_PROVIDER_SITE_OTHER): Payer: Medicare HMO | Admitting: Family Medicine

## 2023-03-28 VITALS — BP 110/72 | HR 61 | Temp 98.1°F | Wt 205.0 lb

## 2023-03-28 DIAGNOSIS — N390 Urinary tract infection, site not specified: Secondary | ICD-10-CM | POA: Diagnosis not present

## 2023-03-28 LAB — POC URINALSYSI DIPSTICK (AUTOMATED)
Bilirubin, UA: NEGATIVE
Glucose, UA: NEGATIVE
Ketones, UA: NEGATIVE
Leukocytes, UA: NEGATIVE
Nitrite, UA: NEGATIVE
Protein, UA: NEGATIVE
Spec Grav, UA: <=1.005 — AB (ref 1.010–1.025)
Urobilinogen, UA: 0.2 U/dL
pH, UA: 7 (ref 5.0–8.0)

## 2023-03-28 MED ORDER — NITROFURANTOIN MONOHYD MACRO 100 MG PO CAPS: 100.0000 mg | ORAL_CAPSULE | Freq: Two times a day (BID) | ORAL | 0 refills | Status: DC

## 2023-03-28 NOTE — Progress Notes (Signed)
   Subjective:    Patient ID: Gina Key, female    DOB: August 05, 1945, 77 y.o.   MRN: [id number]  HPI Here for 2 weeks of urinary frequency and urgency, along with low back pain. No burning or fever.    Review of Systems  Constitutional: Negative.   Respiratory: Negative.    Cardiovascular: Negative.   Gastrointestinal: Negative.   Genitourinary:  Positive for frequency and urgency. Negative for dysuria, flank pain and hematuria.       Objective:   Physical Exam Constitutional:      Appearance: Normal appearance.  Cardiovascular:     Rate and Rhythm: Normal rate and regular rhythm.     Pulses: Normal pulses.     Heart sounds: Normal heart sounds.  Pulmonary:     Effort: Pulmonary effort is normal.     Breath sounds: Normal breath sounds.  Abdominal:     Tenderness: There is no right CVA tenderness or left CVA tenderness.  Neurological:     Mental Status: She is alert.           Assessment & Plan:  UTI, treat with 7 days of Macrobid. Culture the sample. Drink plenty of water.  Gershon Crane, MD

## 2023-03-29 ENCOUNTER — Other Ambulatory Visit: Payer: Self-pay | Admitting: Family Medicine

## 2023-03-29 DIAGNOSIS — Z1231 Encounter for screening mammogram for malignant neoplasm of breast: Secondary | ICD-10-CM

## 2023-03-29 DIAGNOSIS — N390 Urinary tract infection, site not specified: Secondary | ICD-10-CM | POA: Diagnosis not present

## 2023-03-30 LAB — URINE CULTURE
MICRO NUMBER:: 15426252
Result:: NO GROWTH
SPECIMEN QUALITY:: ADEQUATE

## 2023-04-06 ENCOUNTER — Telehealth: Payer: Self-pay | Admitting: Orthopedic Surgery

## 2023-04-06 ENCOUNTER — Other Ambulatory Visit: Payer: Self-pay

## 2023-04-06 ENCOUNTER — Encounter: Payer: Self-pay | Admitting: Orthopedic Surgery

## 2023-04-06 ENCOUNTER — Ambulatory Visit: Payer: Medicare HMO | Admitting: Orthopedic Surgery

## 2023-04-06 DIAGNOSIS — M25511 Pain in right shoulder: Secondary | ICD-10-CM

## 2023-04-06 DIAGNOSIS — M19011 Primary osteoarthritis, right shoulder: Secondary | ICD-10-CM | POA: Diagnosis not present

## 2023-04-06 MED ORDER — LIDOCAINE HCL 1 % IJ SOLN
5.0000 mL | INTRAMUSCULAR | Status: AC | PRN
Start: 2023-04-06 — End: 2023-04-06
  Administered 2023-04-06: 5 mL

## 2023-04-06 MED ORDER — METHYLPREDNISOLONE ACETATE 40 MG/ML IJ SUSP
40.0000 mg | INTRAMUSCULAR | Status: AC | PRN
Start: 2023-04-06 — End: 2023-04-06
  Administered 2023-04-06: 40 mg via INTRA_ARTICULAR

## 2023-04-06 MED ORDER — BUPIVACAINE HCL 0.5 % IJ SOLN
9.0000 mL | INTRAMUSCULAR | Status: AC | PRN
Start: 2023-04-06 — End: 2023-04-06
  Administered 2023-04-06: 9 mL via INTRA_ARTICULAR

## 2023-04-06 NOTE — Progress Notes (Signed)
Office Visit Note   Patient: Gina Key           Date of Birth: 23-Mar-1946           MRN: [id number] Visit Date: 04/06/2023 Requested by: Deeann Saint, MD [address] [place],  [place] [address] PCP: Deeann Saint, MD  Subjective: Chief Complaint  Patient presents with   Right Shoulder - Pain    HPI: Gina Key is a 77 y.o. female who presents to the office reporting right shoulder pain.  Patient describes increased pain in the shoulder.  She was considering an injection last clinic visit but declined.  Shoulder pain is gotten worse.  She does have significant arthritis based on prior evaluation.  She wants to avoid surgery.  She denies any radicular symptoms..                ROS: All systems reviewed are negative as they relate to the chief complaint within the history of present illness.  Patient denies fevers or chills.  Assessment & Plan: Visit Diagnoses:  1. Right shoulder pain, unspecified chronicity     Plan: Impression is right shoulder arthritis.  Glenohumeral joint injection performed today under ultrasound guidance.  Will see how she does with that intervention.  As needed.  Follow-Up Instructions: No follow-ups on file.   Orders:  Orders Placed This Encounter  Procedures   US Guided Needle Placement - No Linked Charges   No orders of the defined types were placed in this encounter.     Procedures: Large Joint Inj: R glenohumeral on 04/06/2023 3:08 PM Indications: diagnostic evaluation and pain Details: 22 G 1.5 in needle, ultrasound-guided posterior approach  Arthrogram: No  Medications: 9 mL bupivacaine 0.5 %; 40 mg methylPREDNISolone acetate 40 MG/ML; 5 mL lidocaine 1 % Outcome: tolerated well, no immediate complications Procedure, treatment alternatives, risks and benefits explained, specific risks discussed. Consent was given by the patient. Immediately prior to procedure a time out was called to verify the correct  patient, procedure, equipment, support staff and site/side marked as required. Patient was prepped and draped in the usual sterile fashion.       Clinical Data: No additional findings.  Objective: Vital Signs: There were no vitals taken for this visit.  Physical Exam:  Constitutional: Patient appears well-developed HEENT:  Head: Normocephalic Eyes:EOM are normal Neck: Normal range of motion Cardiovascular: Normal rate Pulmonary/chest: Effort normal Neurologic: Patient is alert Skin: Skin is warm Psychiatric: Patient has normal mood and affect  Ortho Exam: Ortho exam demonstrates range of motion of that right shoulder of 20/80/120.  Deltoid fires.  Motor or sensory function of the hand is intact.  She does have some coarseness and grinding but pretty reasonable rotator cuff strength particularly with subscap testing.  No discrete AC joint tenderness.  No masses lymphadenopathy or skin changes noted in that shoulder girdle region.  Specialty Comments:  No specialty comments available.  Imaging: US Guided Needle Placement - No Linked Charges  Result Date: 04/06/2023 Ultrasound imaging demonstrates needle placement into the right glenohumeral joint with extravasation of fluid into the joint and no complicating features    PMFS History: Patient Active Problem List   Diagnosis Date Noted   Osteopenia 12/14/2022   Essential hypertension 09/28/2021   Gastroesophageal reflux disease 09/28/2021   Seasonal allergies 09/28/2021   History of migraine 09/28/2021   Urinary incontinence 09/28/2021   Mixed hyperlipidemia 09/28/2021   History of seizure 09/28/2021   OSA (  obstructive sleep apnea) 09/28/2021   Past Medical History:  Diagnosis Date   Acid reflux    Allergy    Anxiety    DDD (degenerative disc disease), lumbar    Depression    Facet joint disease    GERD (gastroesophageal reflux disease)    High cholesterol    Hypertension    OSA (obstructive sleep apnea)     Primary osteoarthritis involving multiple joints    Seizures (HCC)    Sleep apnea    C PAP    Family History  Problem Relation Age of Onset   Pancreatic cancer Mother    Thyroid cancer Sister    Cancer Brother        tongue   Seizures Paternal Grandmother    Colon polyps Daughter    Kidney cancer Daughter    Sleep apnea Daughter    Sleep apnea Son    Stroke Son    Seizures Son    Sleep apnea Son    Seizures Grandson    Colon cancer Neg Hx    Crohn's disease Neg Hx    Esophageal cancer Neg Hx    Rectal cancer Neg Hx    Stomach cancer Neg Hx    Ulcerative colitis Neg Hx     Past Surgical History:  Procedure Laterality Date   BREAST BIOPSY     CHOLECYSTECTOMY     left and right rotator cuff     Social History   Occupational History   Not on file  Tobacco Use   Smoking status: Never   Smokeless tobacco: Never  Vaping Use   Vaping status: Never Used  Substance and Sexual Activity   Alcohol use: Never   Drug use: Never   Sexual activity: Not on file

## 2023-04-06 NOTE — Telephone Encounter (Signed)
Patient called and wants a referral to a pain management place. Patient said she had the shot and still in pain.CB#[phone]

## 2023-04-08 NOTE — Telephone Encounter (Signed)
Injection can take up to 1 to 2 weeks to see full effect.  Okay for referral to pain management

## 2023-04-09 NOTE — Addendum Note (Signed)
Addended by: Barbette Or on: 04/09/2023 09:03 AM   Modules accepted: Orders

## 2023-04-09 NOTE — Telephone Encounter (Signed)
Pain management referral placed in chart

## 2023-04-09 NOTE — Telephone Encounter (Signed)
Lvm advising pt.

## 2023-04-16 ENCOUNTER — Telehealth: Payer: Self-pay | Admitting: Surgical

## 2023-04-16 NOTE — Telephone Encounter (Signed)
Received call from patient's daughter Kathie Rhodes she advised will need a pain management clinic in [place] instead of [place].  Kathie Rhodes asked if she can get a call back as soon as possible because they have been waiting for the referral for quite some time now.   The number to contact Kathie Rhodes is [phone]

## 2023-04-16 NOTE — Telephone Encounter (Signed)
Sent message to [facility] with [facility]

## 2023-04-23 ENCOUNTER — Encounter: Payer: Self-pay | Admitting: Neurology

## 2023-04-23 ENCOUNTER — Ambulatory Visit: Payer: Medicare HMO | Admitting: Neurology

## 2023-04-23 VITALS — BP 139/72 | HR 66 | Ht 62.0 in | Wt 207.0 lb

## 2023-04-23 DIAGNOSIS — G4733 Obstructive sleep apnea (adult) (pediatric): Secondary | ICD-10-CM | POA: Diagnosis not present

## 2023-04-23 DIAGNOSIS — Z789 Other specified health status: Secondary | ICD-10-CM | POA: Diagnosis not present

## 2023-04-23 DIAGNOSIS — R4189 Other symptoms and signs involving cognitive functions and awareness: Secondary | ICD-10-CM | POA: Diagnosis not present

## 2023-04-23 DIAGNOSIS — Z87898 Personal history of other specified conditions: Secondary | ICD-10-CM | POA: Diagnosis not present

## 2023-04-23 NOTE — Progress Notes (Signed)
Subjective:    Patient ID: Gina Key is a 77 y.o. female.  HPI    Interim history:   Gina Key is a 77 year old female with an underlying medical history of reflux disease, allergies, history of migraines, history of seizure-like activity (on Keppra), hyperlipidemia, arthritis, hypertension, OSA on PAP therapy and obesity, who presents for a new problem visit of memory loss.  The patient is accompanied by her daughter today.  She has followed in this clinic for sleep apnea as well as seizure concern in the past.    She saw Margie Ege, NP in a virtual visit on 10/17/2022.  Today, 04/23/2023: She reports not having any major issues with her memory, sometimes she is forgetful.  Her daughter reports that she has herself not noticed much in the way of problem with her mom's memory but that patient's younger sister thought there was some concern about her forgetfulness and sisters husband has dementia so she mentioned it to them.  Patient reports that she continues to take Keppra without any side effects, she is also on Topamax for headaches.  She has not had any major medication changes.  Daughter reports that sometimes her balance is not good, she has not fallen recently.  She admits that she does not always hydrate well but has become better with her water intake.  She lives with daughter, daughter's husband and daughters 2 boys as well as daughter's grandchild.  She does not drink any alcohol.  She is a non-smoker. She saw her PCP, Dr. Salomon Fick on 03/05/2023, I reviewed the office visit note.  Her MoCA score was 13 at the time.    She had a head CT without contrast on 03/16/2023 and I reviewed the results:   IMPRESSION: 1. No evidence of acute intracranial abnormality. 2. Mild chronic small vessel ischemic disease.   She had blood work on 03/05/2023 and I reviewed the results in her epic chart.  Vitamin B12 was on the lower end of normal at 355, folate normal at 8.2, CMP showed benign  findings with sodium of 144, potassium 3.9, BUN 13, creatinine 0.9, alk phos 117 which was borderline normal on the higher end, AST 19, ALT 14, CBC with differential and platelets benign without any evidence of anemia.  TSH normal at 1.07, free T4 normal at 0.64, vitamin D on the lower end of normal at 41.57.     She admits that she has not been using her AutoPap machine.  Her latest 90-day download shows no usage since 02/03/2023.  She was quite good with her compliance in the month of June through mid July.  She reports that it feels like she is smothering from the air.  She uses a fullface mask.  She is agreeable to restarting treatment with a modified pressure, new supplies and also with humidity reduction as she has had instances of water accumulating in the mask.  Previously:   10/17/2022 (SS, MyChart phone visit): <<Here today for follow-up via telephone visit, we could not connect virtually.  She is accompanied by her daughter.  Review of CPAP data 09/16/2022-10/11/2022 shows suboptimal usage 10/30 days at 33%, greater than 4 hours 30%.  Average usage days used 8 hours 11 minutes.  Minimum pressure 5, maximum pressure 11 cm water.  EPR level 3.  Leak 1.2, AHI 0.6.  Indicates her usage has been poor due to travel, taking care of sick family members.  Prior in January she cannot use CPAP because she was waiting for  a new mask.  She is using full face.  She does think she benefits from CPAP.  She is motivated to get back to consistent use.  She denies any seizures.  Remains on Keppra without side effect.>>  04/11/2022 (SA): I reviewed her AutoPap compliance data from 03/11/2022 through 04/09/2022, which is a total of 30 days, during which time she used her machine 29 days with percent use days greater than 4 hours at 80%, indicating excellent compliance with an average usage of 5 hours and 17 minutes, residual AHI at goal at 0.6/h, average pressure for the 95th percentile at 8 cm with a range of 5 to 11 cm  with EPR.  Leak rather low with the 95th percentile at 0.8 L/min.  She reports doing fairly well with the machine, she is certainly motivated to continue with treatment and is compliant with it but has had some trouble lately with the display of the machine and with the comfort of the mask.  She is using a medium Siesta 3B fullface mask, she also has a small insert but was told that the small is too small for her.  But the looks of it, the small seems to be a better size for her from my standpoint but she does have an appointment to discuss mask and machine issues with her DME provider.  She also has had instances of water in the tubing which was uncomfortable as it rose into the mask.  She has had recent stress, her son has been hospitalized for unresponsiveness and seizure as I understand.  Understandably, she is worried about him.  She has not had any further episodes of unresponsiveness or seizure-like activity herself.  She continues to take Keppra 500 mg twice daily.  She does not always hydrate well with water, per daughter.  Patient estimates that she drinks about 2 bottles of water per day.  She likes to drink V8 energy drink, 1 can/day and occasional soda.  Her daytime energy has improved but she does doze off sometimes according to the daughter.   I first met her at the request of her primary care physician on 12/05/2021, at which time she reported a prior diagnosis of obstructive sleep apnea.  She was no longer on positive airway pressure treatment.  She was advised to proceed with a sleep study.  I saw her for a new problem visit on 12/13/2021 for an episode of unresponsiveness.  She had presented to the emergency room and had been started on Keppra.  We proceeded with an EEG.  She had an EEG on 12/15/2021 and I reviewed the results:  Impression: This is a normal EEG recording in the waking and drowsy state. No evidence of interictal epileptiform discharges seen. A normal EEG does not exclude a  diagnosis of epilepsy.     She was notified via MyChart message.   She had a home sleep test on 01/17/2022 which indicated overall mild obstructive sleep apnea with a total AHI of 8.2/hour and O2 nadir of 87%. Snoring was detected, and appeared to be mild to moderate, intermittent.  She was advised to proceed with AutoPap therapy.  Her set up date was 02/23/2022.  She has a ResMed AirSense 11 AutoSet machine.      12/13/21: (She) presents for a new problem visit for a suspected seizure.  She is referred by the emergency room.  I recently saw her on 12/05/2021 at the request of her primary care physician for evaluation of her sleep  apnea.  The patient is accompanied by her daughter today.  She presented to the emergency room on 12/11/2021 via EMS.  She she was found lying on the floor on her side, she appeared confused, no convulsion or twitching was seen.  No evidence of tongue bite or urinary incontinence or bowel incontinence.  I reviewed the emergency room records from 12/11/2021.  She had a CT head without contrast and cervical spine CT without contrast on 12/11/2021 and I reviewed the results: IMPRESSION: 1. No evidence of acute intracranial abnormality. 2. No static evidence of acute injury to the cervical spine. Degenerative changes as described.   She has been given IV Keppra 2 g and and started on oral Keppra 500 mg twice daily.  Laboratory testing showed sodium of 141, potassium on the lower end at 3.6, chloride 116, slightly elevated, glucose 107, BUN 11, creatinine 0.94, normal AST and ALT at 26 and 18, respectively.  CBC with differential and platelet benign with the exception of borderline low hemoglobin at 11.8.  Magnesium was normal at 2.0.   Of note, she had a brain MRI without contrast through [facility] on 03/04/2021 and I reviewed the results: Impression: 1. No acute intracranial process. No acute infarct.  2. Age-related cerebral atrophy and chronic small vessel white  matter disease.    She had presented to the emergency room with at [facility] in [place], [place] on 03/04/2021 with a chief complaint of memory loss and having bitten her tongue in her sleep.  She reported a memory lapse.  She also reported recurrent headaches for about 3 weeks.  She had a head CT without contrast on 03/04/2021 and I reviewed the results: Impression: No acute intracranial abnormality.     She reports feeling okay, no new complaints, daughter feels that she is not back to normal yet, she seems sluggish in her thinking and confused at times.  She has not had any bruising and no injuries, daughter reports that she found her in a small space at the foot end of her bed on the floor mumbling and fidgeting with her walker.  She had not hurt herself against the foot end of the bed or sustaining any bruising.  Patient does not recall falling.  She does not recall feeling lightheaded or dizzy or nauseated.  She was getting dressed in the morning and her daughter checked on her to see what she would like for breakfast.  Shortly before patient had a conversation with the son-in-law in the kitchen.  She has had some milder recurrent headaches.  She admits that she does not hydrate well with water, estimates that she drinks 1 or 2 bottles of water per day.  She drinks occasional soda, no coffee.  She sustained no tongue bite and no bladder or bowel incontinence.  She is on Keppra 500 mg twice daily.     12/05/21: (She) was previously diagnosed with obstructive sleep apnea and placed on positive airway pressure treatment.  She is currently no longer on a CPAP or similar machine, prior testing was several years ago and test results are not available for my review today. Testing was in [place]. I reviewed your office note from 09/28/2021.  Her Epworth sleepiness score is 0/24, fatigue severity score is 30 out of 63.  She reports that she used her machine in the beginning, at least for a few  months but then the machine broke.  She reports that she had a  nightmare and she threw the machine.  She reports a family history of sleep apnea. Her sister, one son and her other daughter have OSA. She lives with Kathie Rhodes, she has a total of 6 kids. She is widowed. She would be willing to get retested and start PAP therapy again. Her bedtime is around 11-11:30 PM and rise time is around 5:30 AM, she does not sleep through the night.  She denies recurrent nocturnal or morning headaches.  She has nocturia about once per average night.  She is working on weight loss, weight has been fluctuating.  She is a non-smoker and does not utilize alcohol and does not drink caffeine daily.  She often wakes up in the early morning hours and has trouble falling asleep but may doze off for a few more hours.      Her Past Medical History Is Significant For: Past Medical History:  Diagnosis Date   Acid reflux    Allergy    Anxiety    DDD (degenerative disc disease), lumbar    Depression    Facet joint disease    GERD (gastroesophageal reflux disease)    High cholesterol    Hypertension    Low vitamin D level    OSA (obstructive sleep apnea)    Primary osteoarthritis involving multiple joints    Seizures (HCC)    Sleep apnea    C PAP    Her Past Surgical History Is Significant For: Past Surgical History:  Procedure Laterality Date   BREAST BIOPSY     CHOLECYSTECTOMY     left and right rotator cuff      Her Family History Is Significant For: Family History  Problem Relation Age of Onset   Pancreatic cancer Mother    Thyroid cancer Sister    Cancer Brother        tongue   Seizures Paternal Grandmother    Colon polyps Daughter    Kidney cancer Daughter    Sleep apnea Daughter    Sleep apnea Son    Stroke Son    Seizures Son    Sleep apnea Son    Seizures Grandson    Colon cancer Neg Hx    Crohn's disease Neg Hx    Esophageal cancer Neg Hx    Rectal cancer Neg Hx    Stomach cancer Neg Hx     Ulcerative colitis Neg Hx    Dementia Neg Hx     Her Social History Is Significant For: Social History   Socioeconomic History   Marital status: Widowed    Spouse name: Not on file   Number of children: 6   Years of education: 11   Highest education level: Not on file  Occupational History   Not on file  Tobacco Use   Smoking status: Never   Smokeless tobacco: Never  Vaping Use   Vaping status: Never Used  Substance and Sexual Activity   Alcohol use: Never   Drug use: Never   Sexual activity: Not on file  Other Topics Concern   Not on file  Social History Narrative   Lives at home with daughter   Right handed   Caffeine: none    Social Determinants of Health   Financial Resource Strain: Low Risk  (02/16/2023)   Overall Financial Resource Strain (CARDIA)    Difficulty of Paying Living Expenses: Not hard at all  Food Insecurity: No Food Insecurity (02/16/2023)   Hunger Vital Sign    Worried About Running Out  of Food in the Last Year: Never true    Ran Out of Food in the Last Year: Never true  Transportation Needs: No Transportation Needs (02/16/2023)   PRAPARE - Administrator, Civil Service (Medical): No    Lack of Transportation (Non-Medical): No  Physical Activity: Inactive (02/16/2023)   Exercise Vital Sign    Days of Exercise per Week: 0 days    Minutes of Exercise per Session: 0 min  Stress: No Stress Concern Present (02/16/2023)   [employer] of Occupational Health - Occupational Stress Questionnaire    Feeling of Stress : Not at all  Social Connections: Moderately Integrated (02/16/2023)   Social Connection and Isolation Panel [NHANES]    Frequency of Communication with Friends and Family: More than three times a week    Frequency of Social Gatherings with Friends and Family: More than three times a week    Attends Religious Services: More than 4 times per year    Active Member of [employer] or Organizations: Yes    Attends Banker  Meetings: More than 4 times per year    Marital Status: Widowed    Her Allergies Are:  No Known Allergies:   Her Current Medications Are:  Outpatient Encounter Medications as of 04/23/2023  Medication Sig   aspirin 325 MG EC tablet Take 325 mg by mouth daily.   atorvastatin (LIPITOR) 40 MG tablet TAKE 1 TABLET (40 MG TOTAL) BY MOUTH DAILY. SCHEDULE ANNUAL VISIT FOR FUTURE REFILLS   Incontinence Supply Disposable (WINGS HL ADULT BRIEFS/XL) MISC As needed daily.   ipratropium (ATROVENT) 0.06 % nasal spray PLACE 2 SPRAYS INTO BOTH NOSTRILS 4 TIMES DAILY.   levETIRAcetam (KEPPRA) 500 MG tablet Take 1 tablet (500 mg total) by mouth 2 (two) times daily.   losartan (COZAAR) 100 MG tablet TAKE 1 TABLET BY MOUTH EVERY DAY   metoprolol succinate (TOPROL-XL) 25 MG 24 hr tablet TAKE 1 TABLET (25 MG TOTAL) BY MOUTH DAILY.   montelukast (SINGULAIR) 10 MG tablet TAKE 1 TABLET BY MOUTH EVERYDAY AT BEDTIME   pantoprazole (PROTONIX) 20 MG tablet TAKE 1 TABLET BY MOUTH EVERY DAY   potassium chloride (KLOR-CON) 10 MEQ tablet Take 10 mEq by mouth daily.   spironolactone (ALDACTONE) 25 MG tablet TAKE 1 TABLET (25 MG TOTAL) BY MOUTH DAILY.   topiramate (TOPAMAX) 100 MG tablet TAKE 1 TABLET BY MOUTH TWICE A DAY   nitrofurantoin, macrocrystal-monohydrate, (MACROBID) 100 MG capsule Take 1 capsule (100 mg total) by mouth 2 (two) times daily. (Patient not taking: Reported on 04/23/2023)   Vitamin D, Ergocalciferol, (DRISDOL) 1.25 MG (50000 UNIT) CAPS capsule Take 1 capsule (50,000 Units total) by mouth every 7 (seven) days. (Patient not taking: Reported on 04/23/2023)   No facility-administered encounter medications on file as of 04/23/2023.  :  Review of Systems:  Out of a complete 14 point review of systems, all are reviewed and negative with the exception of these symptoms as listed below:  Review of Systems  Neurological:        Patient is here with her daughter Kathie Rhodes for memory loss. The patient feels her  memory is fine. Her daughter states for about a year she has had trouble. She forgets things she is told. She misplaces things. She hasn't driven in almost 2 years since she had a seizure. Both patient and daughter deny any known trouble with activities of daily living. Patient has not recently been using her CPAP machine. She states it feels  like it is smothering her.     Objective:  Neurological Exam  Physical Exam Physical Examination:   Vitals:   04/23/23 1459  BP: 139/72  Pulse: 66    General Examination: The patient is a very pleasant 77 y.o. female in no acute distress. She appears well-developed and well-nourished and well groomed.   HEENT: Normocephalic, atraumatic, pupils are equal, round and reactive to light, corrective eyeglasses in place, tracking well-preserved, no nystagmus.  Face is symmetric with normal facial animation, speech is clear without dysarthria, hypophonia or voice tremor. Neck is supple with full range of passive and active motion. There are no carotid bruits on auscultation. Oropharynx exam reveals: mild mouth dryness, adequate dental hygiene and mild airway crowding.  Tongue protrudes centrally and palate elevates symmetrically.  Stable findings altogether.   Chest: Clear to auscultation without wheezing, rhonchi or crackles noted.   Heart: S1+S2+0, regular and normal without murmurs, rubs or gallops noted.    Abdomen: Soft, non-tender and non-distended.   Extremities: There is no obvious edema.      Skin: Warm and dry without trophic changes noted.    Musculoskeletal: exam reveals no obvious joint deformities.    Neurologically:  Mental status: The patient is awake, alert and oriented in all 4 spheres. Her immediate and remote memory, attention, language skills and fund of knowledge are quite appropriate. There is no evidence of aphasia, agnosia, apraxia or anomia. Speech is clear with normal prosody and enunciation. Thought process is linear. Mood is  normal and affect is normal.  Cranial nerves II - XII are as described above under HEENT exam.  Motor exam: Normal bulk, strength and tone is noted. There is no obvious tremor. Fine motor skills and coordination: grossly intact for age.  Cerebellar testing: No dysmetria or intention tremor. There is no truncal or gait ataxia.  Reflexes are 1+ in the upper extremities, trace in the knees and absent in the ankles. Sensory exam: intact to light touch in the upper and lower extremities.  Gait, station and balance: She stands slowly and walks slowly, she walks some without her walking aids, she brought a 3 point cane.  No shuffling, preserved arm swing bilaterally.  Assessment and Plan:  In summary, Gina Key is a very pleasant 77 year old female with an underlying medical history of reflux disease, allergies, history of migraines, history of seizure-like activity (stable on Keppra), hyperlipidemia, arthritis, hypertension, OSA on PAP therapy and obesity, who presents for a new problem visit of memory concerns.  She is not particularly worried about memory loss, daughter has not noticed any major problem but reports that patient's sister wanted her to get checked out.  She had a recent head CT as well as blood work in August 2024 through PCP.  We talked about her test results.  We talked about vascular risk factor prevention.  She is on Lipitor, recently stopped taking her aspirin, she was taking 325 mg strength.  She is advised to restart baby aspirin, she is encouraged to run this by her PCP as well.  She does not need any further lab testing or imaging test from my end of things, she is encouraged to get back on AutoPap therapy.  She is advised that we can change her pressure settings, which may help her adjust to it a little better.  She was previously quite good with her compliance not too long ago.  We talked about the importance of fall prevention and good hydration, good nutrition.  We mutually  agreed not to start any new medications.  She does not currently wish to come off of the Keppra, she has not had any seizures or seizure-like activity in the past 2 years but we can maintain her Keppra at the current dose, she is advised that we may consider tapering her off in the future.  We will continue to monitor her symptoms and memory function.  She is advised to follow-up routinely in this clinic to see one of our nurse practitioners in 6 months, sooner if needed.  I answered all the questions today and the patient and her daughter were in agreement with our approach.  I spent 40 minutes in total face-to-face time and in reviewing records during pre-charting, more than 50% of which was spent in counseling and coordination of care, reviewing test results, reviewing medications and treatment regimen and/or in discussing or reviewing the diagnosis of cognitive complaints, OSA, the prognosis and treatment options. Pertinent laboratory and imaging test results that were available during this visit with the patient were reviewed by me and considered in my medical decision making (see chart for details).

## 2023-04-23 NOTE — Patient Instructions (Signed)
Please continue using your autoPAP regularly. While your insurance requires that you use PAP at least 4 hours each night on 70% of the nights, I recommend, that you not skip any nights and use it throughout the night if you can. Getting used to PAP and staying with the treatment long term does take time and patience and discipline. Untreated obstructive sleep apnea when it is moderate to severe can have an adverse impact on cardiovascular health and raise her risk for heart disease, arrhythmias, hypertension, congestive heart failure, stroke and diabetes. Untreated obstructive sleep apnea causes sleep disruption, nonrestorative sleep, and sleep deprivation. This can have an impact on your day to day functioning and cause daytime sleepiness and impairment of cognitive function, memory loss, mood disturbance, and problems focussing. Using PAP regularly can improve these symptoms.  I will write for new supplies and also a pressure change on your AutoPap machine.  Using AutoPap consistently may also help your memory.   Please try to drink more water, 6 to 8 cups of water per day recommended, generally speaking, 8 ounce size each.   We do not have to do any additional testing at this time as you recently had a CT scan of the head and blood work through your primary care.  We will continue to monitor your memory.   Please continue your current medications including the levetiracetam 500 mg twice daily.  Follow-up in about 6 months to see Margie Ege, NP, we can cancel tomorrow's appointment.

## 2023-04-24 ENCOUNTER — Ambulatory Visit: Payer: Medicare HMO | Admitting: Neurology

## 2023-04-25 ENCOUNTER — Encounter: Payer: Self-pay | Admitting: Physical Medicine & Rehabilitation

## 2023-04-25 NOTE — Progress Notes (Signed)
Zott, Hennie Duos, RN; Melvern Sample Got It     Previous Messages    ----- Message ----- From: Guy Begin, RN Sent: 04/25/2023   8:59 AM EDT To: Marlou Porch Zott Subject: autopap order changes                          Good Morning,  New order in epic,  I made changes in [place], are pending. Renew pap supplies.  Gina Key dob 1945/08/01  Thanks  Langley Gauss

## 2023-05-04 ENCOUNTER — Ambulatory Visit: Payer: Medicare HMO | Admitting: Family Medicine

## 2023-05-04 ENCOUNTER — Encounter: Payer: Self-pay | Admitting: Family Medicine

## 2023-05-04 ENCOUNTER — Encounter: Payer: Self-pay | Admitting: Neurology

## 2023-05-04 VITALS — BP 124/76 | Temp 98.0°F | Ht 62.0 in | Wt 205.4 lb

## 2023-05-04 DIAGNOSIS — Z23 Encounter for immunization: Secondary | ICD-10-CM

## 2023-05-04 DIAGNOSIS — G4733 Obstructive sleep apnea (adult) (pediatric): Secondary | ICD-10-CM

## 2023-05-04 DIAGNOSIS — I10 Essential (primary) hypertension: Secondary | ICD-10-CM

## 2023-05-04 DIAGNOSIS — R413 Other amnesia: Secondary | ICD-10-CM

## 2023-05-04 NOTE — Progress Notes (Signed)
Established Patient Office Visit   Subjective  Patient ID: Gina Key, female    DOB: [birth year]  Age: 77 y.o. MRN: [id number]  Chief Complaint  Patient presents with   Medical Management of Chronic Issues   Pt her daughter. Patient is a 77 year old female seen for follow-up on chronic conditions.  Since last OFV patient was seen by neurology for memory changes.  Brain with chronic small vessel changes. Neuro did not note any current issues with memory.  Advised to follow-up in 6 months with NP.  Patient states her memory is fine.  Patient completed 12 weeks of ergocalciferol 50,000 IUs weekly.  Not currently taking aspirin.  Got a new mask for CPAP.  Unsure of if it is fitting properly.  States energy is good, feels rested when wakes up.  Endorses an occasional decrease in appetite.  Patient tried to get refill on over-the-counter vitamin D but [facility] was out.    Patient Active Problem List   Diagnosis Date Noted   Osteopenia 12/14/2022   Essential hypertension 09/28/2021   Gastroesophageal reflux disease 09/28/2021   Seasonal allergies 09/28/2021   History of migraine 09/28/2021   Urinary incontinence 09/28/2021   Mixed hyperlipidemia 09/28/2021   History of seizure 09/28/2021   OSA (obstructive sleep apnea) 09/28/2021   Past Medical History:  Diagnosis Date   Acid reflux    Allergy    Anxiety    DDD (degenerative disc disease), lumbar    Depression    Facet joint disease    GERD (gastroesophageal reflux disease)    High cholesterol    Hypertension    Low vitamin D level    OSA (obstructive sleep apnea)    Primary osteoarthritis involving multiple joints    Seizures (HCC)    Sleep apnea    C PAP   Past Surgical History:  Procedure Laterality Date   BREAST BIOPSY     CHOLECYSTECTOMY     left and right rotator cuff     Social History   Tobacco Use   Smoking status: Never   Smokeless tobacco: Never  Vaping Use   Vaping status: Never Used   Substance Use Topics   Alcohol use: Never   Drug use: Never   Family History  Problem Relation Age of Onset   Pancreatic cancer Mother    Thyroid cancer Sister    Cancer Brother        tongue   Seizures Paternal Grandmother    Colon polyps Daughter    Kidney cancer Daughter    Sleep apnea Daughter    Sleep apnea Son    Stroke Son    Seizures Son    Sleep apnea Son    Seizures Grandson    Colon cancer Neg Hx    Crohn's disease Neg Hx    Esophageal cancer Neg Hx    Rectal cancer Neg Hx    Stomach cancer Neg Hx    Ulcerative colitis Neg Hx    Dementia Neg Hx    No Known Allergies    ROS Negative unless stated above    Objective:     BP 124/76 (BP Location: Left Arm, Patient Position: Sitting, Cuff Size: Normal)   Temp 98 F (36.7 C) (Oral)   Ht 5\' 2"  (1.575 m)   Wt 205 lb 6.4 oz (93.2 kg)   BMI 37.57 kg/m  BP Readings from Last 3 Encounters:  05/04/23 124/76  04/23/23 139/72  03/28/23 110/72   Wt Readings  from Last 3 Encounters:  05/04/23 205 lb 6.4 oz (93.2 kg)  04/23/23 207 lb (93.9 kg)  03/28/23 205 lb (93 kg)      Physical Exam Constitutional:      General: She is not in acute distress.    Appearance: Normal appearance.  HENT:     Head: Normocephalic and atraumatic.     Nose: Nose normal.     Mouth/Throat:     Mouth: Mucous membranes are moist.  Cardiovascular:     Rate and Rhythm: Normal rate and regular rhythm.     Heart sounds: Normal heart sounds. No murmur heard.    No gallop.  Pulmonary:     Effort: Pulmonary effort is normal. No respiratory distress.     Breath sounds: Normal breath sounds. No wheezing, rhonchi or rales.  Musculoskeletal:     Right lower leg: No edema.     Left lower leg: No edema.  Skin:    General: Skin is warm and dry.  Neurological:     Mental Status: She is alert and oriented to person, place, and time.      No results found for any visits on 05/04/23.    Assessment & Plan:  Memory change  OSA  (obstructive sleep apnea)  Essential hypertension  Need for influenza vaccination -     Flu Vaccine Trivalent High Dose (Fluad)  Patient seen for follow-up on chronic conditions.  Memory currently stable.  Per neurology to monitor.  Consider restarting aspirin.  Continue Lipitor 40 mg daily.  BP controlled.  Continue current medications including losartan 100 mg daily, Toprol-XL 25 mg daily, spironolactone 25 mg daily.  Influenza vaccine given this visit.  Return in about 4 months (around 09/04/2023).   Deeann Saint, MD

## 2023-05-09 ENCOUNTER — Ambulatory Visit: Payer: Medicare HMO | Admitting: Neurology

## 2023-05-15 ENCOUNTER — Ambulatory Visit: Payer: Medicare HMO | Admitting: Physical Medicine & Rehabilitation

## 2023-05-15 ENCOUNTER — Ambulatory Visit
Admission: RE | Admit: 2023-05-15 | Discharge: 2023-05-15 | Disposition: A | Discharge status: Home / Self Care | Payer: Medicare HMO | Source: Ambulatory Visit | Attending: Family Medicine | Admitting: Family Medicine

## 2023-05-15 DIAGNOSIS — Z1231 Encounter for screening mammogram for malignant neoplasm of breast: Secondary | ICD-10-CM

## 2023-05-17 NOTE — Progress Notes (Addendum)
Subjective:    Patient ID: Gina Key, female    DOB: October 10, 1945, 77 y.o.   MRN: [id number]  HPI      HPI  Gina Key is a 76 y.o. year old female  who  has a past medical history of Acid reflux, Allergy, Anxiety, DDD (degenerative disc disease), lumbar, Depression, Facet joint disease, GERD (gastroesophageal reflux disease), High cholesterol, Hypertension, Low vitamin D level, OSA (obstructive sleep apnea), Primary osteoarthritis involving multiple joints, Seizures (HCC), and Sleep apnea.   They are presenting to PM&R clinic as a new patient for pain management evaluation. They were referred by Harriette Bouillon, PA for treatment of chronic pain.  Patient reports her primary pain is in her right shoulder.  Pain is worse with activity and use of her shoulder.  She cannot lie on her right side due to this pain.  Pain is sharp, aching, stabbing and overall constant.  She was seen by Dr. August Saucer on 04/06/2023 and had a ultrasound-guided shoulder injection with methylprednisolone.  She reports this did help her pain for short time but pain has returned.  Patient was not interested in shoulder surgery or other joint surgery at this time.  She also has some pain in her right hip and left knee.  She feels like this is due to her altered gait.  She uses a cane and walker for ambulation.    She reports poor sleep at night.  Denies depression.  She is previously followed by [facility] for pain management and was treated with buprenorphine 10 mcg every 7 days and Oxycodone 10mg  as needed.   It appears by PDMP review that buprenorphine was discontinued and she was continued on Oxycodone 10mg  by Sanjuan Dame until Feb 2023.  Patient reports she moved to [facility] at this time and had not established with a different pain clinic.  She reports the oxycodone was very helpful for her pain and allowed her to be much more active and use her arm for activities.  She denies any side  effects with the medication.   Red flag symptoms: No red flags for back pain endorsed in Hx or ROS  Medications tried: lidocaine patch help  Topical medications  Nsaids Advil doesn't work Tylenol   didn't help  Opiates  Hydrocodone- didn't help much  Tramadol- stopped helping  Gabapentin / Lyrica  Denies  TCAs  Denies  SNRIs- Denies    Other treatments: PT- Did not help  TENs unit- Denies  Injections- Cortisone injections- dont help  Surgery   Rotator cuff sugery many years ago    Prior UDS results: No results found for: "LABOPIA", "COCAINSCRNUR", "LABBENZ", "AMPHETMU", "THCU", "LABBARB"    Pain Inventory Average Pain 8 Pain Right Now 6 My pain is constant, sharp, burning, stabbing, tingling, and aching  In the last 24 hours, has pain interfered with the following? General activity 7 Relation with others 7 Enjoyment of life 7 What TIME of day is your pain at its worst? morning  and night Sleep (in general) Poor  Pain is worse with: some activites and laying down Pain improves with: medication Relief from Meds: 10  use a cane use a walker ability to climb steps?  yes do you drive?  no  retired  bowel control problems trouble walking  Any changes since last visit?  no  Any changes since last visit?  no    Family History  Problem Relation Age of Onset   Pancreatic  cancer Mother    Thyroid cancer Sister    Colon polyps Daughter    Kidney cancer Daughter    Sleep apnea Daughter    Breast cancer Maternal Aunt 27   Seizures Paternal Grandmother    Cancer Brother        tongue   Sleep apnea Son    Stroke Son    Seizures Son    Sleep apnea Son    Seizures Grandson    Colon cancer Neg Hx    Crohn's disease Neg Hx    Esophageal cancer Neg Hx    Rectal cancer Neg Hx    Stomach cancer Neg Hx    Ulcerative colitis Neg Hx    Dementia Neg Hx    Social History   Socioeconomic History   Marital status: Widowed    Spouse name: Not on file   Number  of children: 6   Years of education: 38   Highest education level: Not on file  Occupational History   Not on file  Tobacco Use   Smoking status: Never   Smokeless tobacco: Never  Vaping Use   Vaping status: Never Used  Substance and Sexual Activity   Alcohol use: Never   Drug use: Never   Sexual activity: Not on file  Other Topics Concern   Not on file  Social History Narrative   Lives at home with daughter   Right handed   Caffeine: none    Social Determinants of Health   Financial Resource Strain: Low Risk  (02/16/2023)   Overall Financial Resource Strain (CARDIA)    Difficulty of Paying Living Expenses: Not hard at all  Food Insecurity: No Food Insecurity (02/16/2023)   Hunger Vital Sign    Worried About Running Out of Food in the Last Year: Never true    Ran Out of Food in the Last Year: Never true  Transportation Needs: No Transportation Needs (02/16/2023)   PRAPARE - Administrator, Civil Service (Medical): No    Lack of Transportation (Non-Medical): No  Physical Activity: Inactive (02/16/2023)   Exercise Vital Sign    Days of Exercise per Week: 0 days    Minutes of Exercise per Session: 0 min  Stress: No Stress Concern Present (02/16/2023)   [employer] of Occupational Health - Occupational Stress Questionnaire    Feeling of Stress : Not at all  Social Connections: Moderately Integrated (02/16/2023)   Social Connection and Isolation Panel [NHANES]    Frequency of Communication with Friends and Family: More than three times a week    Frequency of Social Gatherings with Friends and Family: More than three times a week    Attends Religious Services: More than 4 times per year    Active Member of [employer] or Organizations: Yes    Attends Banker Meetings: More than 4 times per year    Marital Status: Widowed   Past Surgical History:  Procedure Laterality Date   BREAST BIOPSY Right    benign   CHOLECYSTECTOMY     left and right rotator  cuff     Past Medical History:  Diagnosis Date   Acid reflux    Allergy    Anxiety    DDD (degenerative disc disease), lumbar    Depression    Facet joint disease    GERD (gastroesophageal reflux disease)    High cholesterol    Hypertension    Low vitamin D level    OSA (obstructive sleep apnea)  Primary osteoarthritis involving multiple joints    Seizures (HCC)    Sleep apnea    C PAP   There were no vitals taken for this visit.  Opioid Risk Score:   Fall Risk Score:  `1  Depression screen [facility] 2/9     05/04/2023    8:15 AM 03/05/2023    3:30 PM 02/16/2023    3:17 PM 12/07/2022    8:14 AM 05/01/2022   12:23 PM 02/13/2022    3:54 PM 01/05/2022    3:06 PM  Depression screen PHQ 2/9  Decreased Interest 0 0 0 1 0 0 0  Down, Depressed, Hopeless 0 0 0 0 0 0 0  PHQ - 2 Score 0 0 0 1 0 0 0  Altered sleeping 0 0  1  0 0  Tired, decreased energy 0 0  3  0 0  Change in appetite 0 0  3  0 0  Feeling bad or failure about yourself  0 0  0  0 0  Trouble concentrating 0 0  0  0 0  Moving slowly or fidgety/restless 0 0  0  0 0  Suicidal thoughts 0 0  0  0 0  PHQ-9 Score 0 0  8  0 0  Difficult doing work/chores Not difficult at all Not difficult at all  Not difficult at all  Not difficult at all Not difficult at all    Review of Systems  Respiratory:  Positive for apnea.   Gastrointestinal:        Liquid stools - Gallbladder removed years ago  Musculoskeletal:  Positive for gait problem.       Get off balance & out of breath   All other systems reviewed and are negative.      Objective:   Physical Exam   Gen: no distress, normal appearing HEENT: oral mucosa pink and moist, NCAT Cardio: Reg rate Chest: normal effort, normal rate of breathing Abd: soft, non-distended Ext: no edema Psych: pleasant, normal affect Skin: intact Neuro: Alert and awake, follows simple commands, hard of hearing, memory deficits present  RUE: 4-/5 Deltoid-limited by pain, 5/5 Biceps, 5/5  Triceps,  5/5 Grip LUE: 5/5 Deltoid, 5/5 Biceps, 5/5 Triceps, 5/5 Grip RLE: HF 5/5, KE 5/5, KF 5/5, ADF 5/5, APF 5/5 LLE: HF 5/5, KE 5/5, HF, 5/5, ADF 5/5, APF 5/5 Sensory exam normal for light touch and pain in all 4 limbs. No limb ataxia or cerebellar signs. No abnormal tone appreciated.    Musculoskeletal:  Pain with passive range of motion in abduction, forward flexion, internal and external rotation Decreased passive and active range of motion in her right shoulder and abduction, internal and external rotation Patient has tenderness throughout her anterior posterior and lateral shoulder. Spurling's test negative No significant AC joint tenderness Neer's test positive on the right Pain  with right hip internal and external rotation Mild left knee tenderness, no joint laxity noted No significant cervical or lumbar paraspinal tenderness   Shoulder R xray 03/16/23 The transscapular and axillary views are limited. Significant degenerative change in the glenohumeral joint with osteophytes off the inferior glenoid and medial humeral head. AC joint degenerative change. No fracture. No evidence of dislocation. No other bony or soft tissue abnormalities identified.   IMPRESSION: Degenerative changes as above. No fracture or dislocation identified.    C spine CT 12/11/21 Alignment: Normal.   Skull base and vertebrae: No acute fracture. No primary bone lesion or focal pathologic process.  Soft tissues and spinal canal: No prevertebral fluid or swelling. No visible canal hematoma.   Disc levels: Multilevel degenerative disc disease/spondylosis and RIGHT facet arthropathy noted. The following findings are noted:   C2-3: A small central disc protrusion is noted with moderate RIGHT bony foraminal narrowing   C3-4: Mild central spinal and moderate RIGHT bony foraminal narrowing   C4-5: Moderate central spinal and mild to moderate RIGHT bony foraminal narrowing   C5-C6: With  broad-based disc bulge, mild to moderate central spinal and moderate bony biforaminal narrowing   C6-7: With broad-based disc bulge, mild central spinal, mild RIGHT and moderate LEFT bony foraminal narrowing   Upper chest: No acute abnormality   Other: None   IMPRESSION: 1. No evidence of acute intracranial abnormality. 2. No static evidence of acute injury to the cervical spine. Degenerative changes as described.       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

## 2023-05-18 ENCOUNTER — Encounter: Payer: Medicare HMO | Attending: Physical Medicine & Rehabilitation | Admitting: Physical Medicine & Rehabilitation

## 2023-05-18 ENCOUNTER — Encounter: Payer: Self-pay | Admitting: Physical Medicine & Rehabilitation

## 2023-05-18 VITALS — BP 134/81 | HR 64 | Ht 62.0 in | Wt 205.0 lb

## 2023-05-18 DIAGNOSIS — M25551 Pain in right hip: Secondary | ICD-10-CM | POA: Diagnosis not present

## 2023-05-18 DIAGNOSIS — M25562 Pain in left knee: Secondary | ICD-10-CM | POA: Insufficient documentation

## 2023-05-18 DIAGNOSIS — G8929 Other chronic pain: Secondary | ICD-10-CM | POA: Insufficient documentation

## 2023-05-18 DIAGNOSIS — Z79899 Other long term (current) drug therapy: Secondary | ICD-10-CM | POA: Diagnosis not present

## 2023-05-18 DIAGNOSIS — M19011 Primary osteoarthritis, right shoulder: Secondary | ICD-10-CM | POA: Diagnosis not present

## 2023-05-18 DIAGNOSIS — G894 Chronic pain syndrome: Secondary | ICD-10-CM | POA: Diagnosis not present

## 2023-05-18 DIAGNOSIS — Z5181 Encounter for therapeutic drug level monitoring: Secondary | ICD-10-CM | POA: Diagnosis not present

## 2023-05-18 MED ORDER — DULOXETINE HCL 20 MG PO CPEP
20.0000 mg | ORAL_CAPSULE | Freq: Every day | ORAL | 3 refills | Status: DC
Start: 2023-05-18 — End: 2023-06-11

## 2023-05-23 LAB — TOXASSURE SELECT,+ANTIDEPR,UR

## 2023-05-25 ENCOUNTER — Other Ambulatory Visit: Payer: Self-pay | Admitting: Family Medicine

## 2023-05-25 DIAGNOSIS — E782 Mixed hyperlipidemia: Secondary | ICD-10-CM

## 2023-06-05 ENCOUNTER — Ambulatory Visit: Payer: Medicare HMO | Admitting: Neurology

## 2023-06-09 ENCOUNTER — Other Ambulatory Visit: Payer: Self-pay | Admitting: Physical Medicine & Rehabilitation

## 2023-06-11 ENCOUNTER — Encounter: Payer: Medicare HMO | Attending: Physical Medicine & Rehabilitation | Admitting: Physical Medicine & Rehabilitation

## 2023-06-11 ENCOUNTER — Encounter: Payer: Self-pay | Admitting: Physical Medicine & Rehabilitation

## 2023-06-11 VITALS — BP 141/83 | HR 69 | Ht 62.0 in | Wt 204.4 lb

## 2023-06-11 DIAGNOSIS — M19011 Primary osteoarthritis, right shoulder: Secondary | ICD-10-CM | POA: Insufficient documentation

## 2023-06-11 DIAGNOSIS — Z5181 Encounter for therapeutic drug level monitoring: Secondary | ICD-10-CM | POA: Insufficient documentation

## 2023-06-11 DIAGNOSIS — M170 Bilateral primary osteoarthritis of knee: Secondary | ICD-10-CM | POA: Diagnosis not present

## 2023-06-11 MED ORDER — OXYCODONE HCL 5 MG PO TABS
5.0000 mg | ORAL_TABLET | ORAL | 0 refills | Status: DC | PRN
Start: 2023-06-11 — End: 2023-06-11

## 2023-06-11 MED ORDER — OXYCODONE HCL 5 MG PO TABS
5.0000 mg | ORAL_TABLET | Freq: Two times a day (BID) | ORAL | 0 refills | Status: DC | PRN
Start: 2023-06-11 — End: 2023-07-23

## 2023-06-11 NOTE — Progress Notes (Signed)
Subjective:    Patient ID: Gina Key, female    DOB: 05-May-1946, 77 y.o.   MRN: [id number]  HPI      HPI   Gina Key is a 77 y.o. year old female  who  has a past medical history of Acid reflux, Allergy, Anxiety, DDD (degenerative disc disease), lumbar, Depression, Facet joint disease, GERD (gastroesophageal reflux disease), High cholesterol, Hypertension, Low vitamin D level, OSA (obstructive sleep apnea), Primary osteoarthritis involving multiple joints, Seizures (HCC), and Sleep apnea.   They are presenting to PM&R clinic as a new patient for pain management evaluation. They were referred by Harriette Bouillon, PA for treatment of chronic pain.  Patient reports her primary pain is in her right shoulder.  Pain is worse with activity and use of her shoulder.  She cannot lie on her right side due to this pain.  Pain is sharp, aching, stabbing and overall constant.   She was seen by Dr. August Saucer on 04/06/2023 and had a ultrasound-guided shoulder injection with methylprednisolone.  She reports this did help her pain for short time but pain has returned.  Patient was not interested in shoulder surgery or other joint surgery at this time.   She also has some pain in her right hip and left knee.  She feels like this is due to her altered gait.  She uses a cane and walker for ambulation.     She reports poor sleep at night.  Denies depression.   She is previously followed by [facility] for pain management and was treated with buprenorphine 10 mcg every 7 days and Oxycodone 10mg  as needed.   It appears by PDMP review that buprenorphine was discontinued and she was continued on Oxycodone 10mg  by Sanjuan Dame until Feb 2023.  Patient reports she moved to [facility] at this time and had not established with a different pain clinic.  She reports the oxycodone was very helpful for her pain and allowed her to be much more active and use her arm for activities.  She denies any  side effects with the medication.     Red flag symptoms: No red flags for back pain endorsed in Hx or ROS   Medications tried: lidocaine patch help  Topical medications  Nsaids Advil doesn't work Tylenol   didn't help  Opiates  Hydrocodone- didn't help much  Tramadol- stopped helping  Gabapentin / Lyrica  Denies  TCAs  Denies  SNRIs- Denies      Other treatments: PT- Did not help  TENs unit- Denies  Injections- Cortisone injections- dont help  Surgery   Rotator cuff sugery many years ago     Interval history 06/11/2023 Patient is here for follow-up regarding her chronic right shoulder, right hip and bilateral knee pain.  She report she continues to have severe pain in her right shoulder worsened with any kind of movement.  Today she feels that her right knee has been causing her more pain than her left knee.  She does not recall her right hip being a significant source of pain recently, however she has a family member with her who reports that patient continues to complain of right hip pain worsened with ambulation.  She has tried duloxetine but has not noticed a significant change in her pain levels.   Prior UDS results: No results found for: "LABOPIA", "COCAINSCRNUR", "LABBENZ", "AMPHETMU", "THCU", "LABBARB"    Pain Inventory Average Pain 8 Pain Right Now 8 My pain is  constant, sharp, burning, stabbing, tingling, and aching  In the last 24 hours, has pain interfered with the following? General activity 8 Relation with others 8 Enjoyment of life 8 What TIME of day is your pain at its worst? morning , daytime, evening, and night Sleep (in general) Fair  Pain is worse with: some activites Pain improves with:  nothing Relief from Meds: 4    Family History  Problem Relation Age of Onset   Pancreatic cancer Mother    Thyroid cancer Sister    Colon polyps Daughter    Kidney cancer Daughter    Sleep apnea Daughter    Breast cancer Maternal Aunt 92   Seizures Paternal  Grandmother    Cancer Brother        tongue   Sleep apnea Son    Stroke Son    Seizures Son    Sleep apnea Son    Seizures Grandson    Colon cancer Neg Hx    Crohn's disease Neg Hx    Esophageal cancer Neg Hx    Rectal cancer Neg Hx    Stomach cancer Neg Hx    Ulcerative colitis Neg Hx    Dementia Neg Hx    Social History   Socioeconomic History   Marital status: Widowed    Spouse name: Not on file   Number of children: 6   Years of education: 50   Highest education level: Not on file  Occupational History   Not on file  Tobacco Use   Smoking status: Never   Smokeless tobacco: Never  Vaping Use   Vaping status: Never Used  Substance and Sexual Activity   Alcohol use: Never   Drug use: Never   Sexual activity: Not on file  Other Topics Concern   Not on file  Social History Narrative   Lives at home with daughter   Right handed   Caffeine: none    Social Determinants of Health   Financial Resource Strain: Low Risk  (02/16/2023)   Overall Financial Resource Strain (CARDIA)    Difficulty of Paying Living Expenses: Not hard at all  Food Insecurity: No Food Insecurity (02/16/2023)   Hunger Vital Sign    Worried About Running Out of Food in the Last Year: Never true    Ran Out of Food in the Last Year: Never true  Transportation Needs: No Transportation Needs (02/16/2023)   PRAPARE - Administrator, Civil Service (Medical): No    Lack of Transportation (Non-Medical): No  Physical Activity: Inactive (02/16/2023)   Exercise Vital Sign    Days of Exercise per Week: 0 days    Minutes of Exercise per Session: 0 min  Stress: No Stress Concern Present (02/16/2023)   [employer] of Occupational Health - Occupational Stress Questionnaire    Feeling of Stress : Not at all  Social Connections: Moderately Integrated (02/16/2023)   Social Connection and Isolation Panel [NHANES]    Frequency of Communication with Friends and Family: More than three times a week     Frequency of Social Gatherings with Friends and Family: More than three times a week    Attends Religious Services: More than 4 times per year    Active Member of [employer] or Organizations: Yes    Attends Banker Meetings: More than 4 times per year    Marital Status: Widowed   Past Surgical History:  Procedure Laterality Date   BREAST BIOPSY Right    benign  CHOLECYSTECTOMY     left and right rotator cuff     Past Medical History:  Diagnosis Date   Acid reflux    Allergy    Anxiety    DDD (degenerative disc disease), lumbar    Depression    Facet joint disease    GERD (gastroesophageal reflux disease)    High cholesterol    Hypertension    Low vitamin D level    OSA (obstructive sleep apnea)    Primary osteoarthritis involving multiple joints    Seizures (HCC)    Sleep apnea    C PAP   There were no vitals taken for this visit.  Opioid Risk Score:   Fall Risk Score:  `1  Depression screen [facility] 2/9     05/18/2023   10:03 AM 05/04/2023    8:15 AM 03/05/2023    3:30 PM 02/16/2023    3:17 PM 12/07/2022    8:14 AM 05/01/2022   12:23 PM 02/13/2022    3:54 PM  Depression screen PHQ 2/9  Decreased Interest 0 0 0 0 1 0 0  Down, Depressed, Hopeless 0 0 0 0 0 0 0  PHQ - 2 Score 0 0 0 0 1 0 0  Altered sleeping 0 0 0  1  0  Tired, decreased energy 0 0 0  3  0  Change in appetite 0 0 0  3  0  Feeling bad or failure about yourself  0 0 0  0  0  Trouble concentrating 0 0 0  0  0  Moving slowly or fidgety/restless 0 0 0  0  0  Suicidal thoughts 0 0 0  0  0  PHQ-9 Score 0 0 0  8  0  Difficult doing work/chores  Not difficult at all Not difficult at all  Not difficult at all  Not difficult at all    Review of Systems  Constitutional: Negative.   HENT: Negative.    Respiratory:  Positive for apnea.   Cardiovascular: Negative.   Gastrointestinal: Negative.        Liquid stools - Gallbladder removed years ago  Endocrine: Negative.   Genitourinary: Negative.    Musculoskeletal:  Positive for gait problem.       Get off balance & out of breath / right knee and right shoulder  Skin: Negative.   Allergic/Immunologic: Negative.   Hematological: Negative.   Psychiatric/Behavioral: Negative.    All other systems reviewed and are negative.      Objective:   Physical Exam   Gen: no distress, normal appearing HEENT: oral mucosa pink and moist, NCAT Chest: normal effort, normal rate of breathing Abd: soft, non-distended Ext: no edema Psych: pleasant, normal affect Skin: intact Neuro: Alert and awake, follows simple commands, hard of hearing, memory deficits present- unchanged  RUE: 4-/5 Deltoid-limited by pain, 5/5 Biceps, 5/5 Triceps,  5/5 Grip LUE: 5/5 Deltoid, 5/5 Biceps, 5/5 Triceps, 5/5 Grip RLE: HF 5/5, KE 5/5, KF 5/5, ADF 5/5, APF 5/5 LLE: HF 5/5, KE 5/5, HF, 5/5, ADF 5/5, APF 5/5 Sensory exam intact for light touch and pain in all 4 limbs.   Musculoskeletal:  Pain R shoulder with passive range of motion in abduction, forward flexion, internal and external rotation Decreased passive and active range of motion in her right shoulder and abduction, internal and external rotation Patient has tenderness throughout her anterior posterior and lateral shoulder. Pain  with right hip internal and external rotation- unchanged +TTP right knee, medial  and lateral joint line Mild left knee tenderness, no joint laxity noted No significant cervical or lumbar paraspinal tenderness   Shoulder R xray 03/16/23 The transscapular and axillary views are limited. Significant degenerative change in the glenohumeral joint with osteophytes off the inferior glenoid and medial humeral head. AC joint degenerative change. No fracture. No evidence of dislocation. No other bony or soft tissue abnormalities identified.   IMPRESSION: Degenerative changes as above. No fracture or dislocation identified.    C spine CT 12/11/21 Alignment: Normal.   Skull base  and vertebrae: No acute fracture. No primary bone lesion or focal pathologic process.   Soft tissues and spinal canal: No prevertebral fluid or swelling. No visible canal hematoma.   Disc levels: Multilevel degenerative disc disease/spondylosis and RIGHT facet arthropathy noted. The following findings are noted:   C2-3: A small central disc protrusion is noted with moderate RIGHT bony foraminal narrowing   C3-4: Mild central spinal and moderate RIGHT bony foraminal narrowing   C4-5: Moderate central spinal and mild to moderate RIGHT bony foraminal narrowing   C5-C6: With broad-based disc bulge, mild to moderate central spinal and moderate bony biforaminal narrowing   C6-7: With broad-based disc bulge, mild central spinal, mild RIGHT and moderate LEFT bony foraminal narrowing   Upper chest: No acute abnormality   Other: None   IMPRESSION: 1. No evidence of acute intracranial abnormality. 2. No static evidence of acute injury to the cervical spine. Degenerative changes as described.       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 Likely OA  -TENS unit, Zynex nexwave ordered- She didn't  -UDS ordered and PDMP checked -Routine Pill counts -Restart Oxycodone 5mg  PRN today, she used oxycodone 10mg  in the past -Continue duloxetine 20mg  daily for MSK pain, consider trying higher dose at future visit  -Pain Journal Sheet provided -Xray B/L Knees ordered , discussed possible knee injection

## 2023-06-19 ENCOUNTER — Telehealth: Payer: Self-pay

## 2023-06-19 NOTE — Telephone Encounter (Signed)
When patient fell her leg, ankle, shoulder. Pain level 10.

## 2023-06-19 NOTE — Telephone Encounter (Signed)
Per Daughter Gina Key fell while on a cruise. She wanted to know if she can double up on the Oxycodone 5 MG for pain control?  Call back phone [phone].

## 2023-06-19 NOTE — Telephone Encounter (Signed)
Notified daughter

## 2023-06-27 ENCOUNTER — Ambulatory Visit
Admission: RE | Admit: 2023-06-27 | Discharge: 2023-06-27 | Disposition: A | Discharge status: Home / Self Care | Payer: Medicare HMO | Source: Ambulatory Visit | Attending: Physical Medicine & Rehabilitation

## 2023-06-27 DIAGNOSIS — M170 Bilateral primary osteoarthritis of knee: Secondary | ICD-10-CM

## 2023-06-27 DIAGNOSIS — M25561 Pain in right knee: Secondary | ICD-10-CM | POA: Diagnosis not present

## 2023-06-27 DIAGNOSIS — M25562 Pain in left knee: Secondary | ICD-10-CM | POA: Diagnosis not present

## 2023-07-02 ENCOUNTER — Other Ambulatory Visit: Payer: Self-pay | Admitting: Family Medicine

## 2023-07-02 DIAGNOSIS — K219 Gastro-esophageal reflux disease without esophagitis: Secondary | ICD-10-CM

## 2023-07-02 DIAGNOSIS — Z8669 Personal history of other diseases of the nervous system and sense organs: Secondary | ICD-10-CM

## 2023-07-02 DIAGNOSIS — I10 Essential (primary) hypertension: Secondary | ICD-10-CM

## 2023-07-02 DIAGNOSIS — J302 Other seasonal allergic rhinitis: Secondary | ICD-10-CM

## 2023-07-09 DIAGNOSIS — G4733 Obstructive sleep apnea (adult) (pediatric): Secondary | ICD-10-CM | POA: Diagnosis not present

## 2023-07-14 ENCOUNTER — Other Ambulatory Visit: Payer: Self-pay | Admitting: Family Medicine

## 2023-07-19 ENCOUNTER — Telehealth: Payer: Self-pay

## 2023-07-19 NOTE — Telephone Encounter (Signed)
Copied from CRM [id number]. Topic: Clinical - Medication Refill >> Jul 19, 2023  8:57 AM [name] wrote: Most Recent Primary Care Visit:  Provider: Deeann Saint  Department: [facility]  Visit Type: OFFICE VISIT  Date: 05/04/2023  Medication: potassium chloride (KLOR-CON) 10 MEQ tablet   Has the patient contacted their pharmacy? Yes (Agent: If no, request that the patient contact the pharmacy for the refill. If patient does not wish to contact the pharmacy document the reason why and proceed with request.) (Agent: If yes, when and what did the pharmacy advise?)  Is this the correct pharmacy for this prescription? Yes If no, delete pharmacy and type the correct one.  This is the patient's preferred pharmacy:  [id number] [name], [place] - [address] [place] [address] Phone: [phone] Fax: [phone]   Has the prescription been filled recently? No  Is the patient out of the medication? Yes  Has the patient been seen for an appointment in the last year OR does the patient have an upcoming appointment? No  Can we respond through MyChart? Yes  Agent: Please be advised that Rx refills may take up to 3 business days. We ask that you follow-up with your pharmacy.

## 2023-07-20 ENCOUNTER — Other Ambulatory Visit: Payer: Self-pay | Admitting: Family Medicine

## 2023-07-23 ENCOUNTER — Telehealth: Payer: Self-pay

## 2023-07-23 ENCOUNTER — Encounter: Payer: Medicare HMO | Attending: Physical Medicine & Rehabilitation | Admitting: Physical Medicine & Rehabilitation

## 2023-07-23 ENCOUNTER — Encounter: Payer: Self-pay | Admitting: Physical Medicine & Rehabilitation

## 2023-07-23 VITALS — BP 127/77 | HR 70 | Ht 62.0 in | Wt 205.0 lb

## 2023-07-23 DIAGNOSIS — M19011 Primary osteoarthritis, right shoulder: Secondary | ICD-10-CM | POA: Diagnosis not present

## 2023-07-23 DIAGNOSIS — M170 Bilateral primary osteoarthritis of knee: Secondary | ICD-10-CM | POA: Diagnosis not present

## 2023-07-23 DIAGNOSIS — Z79899 Other long term (current) drug therapy: Secondary | ICD-10-CM | POA: Insufficient documentation

## 2023-07-23 MED ORDER — OXYCODONE HCL 7.5 MG PO TABS: 7.5000 mg | ORAL_TABLET | Freq: Two times a day (BID) | ORAL | 0 refills | Status: DC | PRN

## 2023-07-23 NOTE — Telephone Encounter (Signed)
Copied from CRM [id number]. Topic: Clinical - Prescription Issue >> Jul 20, 2023 12:11 PM Alvino Blood C wrote: Reason for CRM: Pt called in wanting an update her medication refill request. Pt requested a refill for the following medication: potassium chloride (KLOR-CON) 10 MEQ tablet be sent to [facility]. When the PT went to get the medication she was advised nothing has been called in.

## 2023-07-23 NOTE — Telephone Encounter (Signed)
Copied from CRM [id number]. Topic: Clinical - Prescription Issue >> Jul 20, 2023 12:53 PM Adaysia C wrote: Reason for CRM: RX refill for potassium chloride (KLOR-CON) 10 MEQ tablet was sent to wrong pharmacy, please resend RX refill to correct preferred pharmacy [id number] [name], [place] - [address] [place] [address] Phone: [phone]  Please contact PT daughter to confirm update of RX refill [phone]

## 2023-07-23 NOTE — Progress Notes (Signed)
Subjective:    Patient ID: Gina Key, female    DOB: 1945/08/29, 77 y.o.   MRN: [id number]  HPI      HPI   Gina Key is a 77 y.o. year old female  who  has a past medical history of Acid reflux, Allergy, Anxiety, DDD (degenerative disc disease), lumbar, Depression, Facet joint disease, GERD (gastroesophageal reflux disease), High cholesterol, Hypertension, Low vitamin D level, OSA (obstructive sleep apnea), Primary osteoarthritis involving multiple joints, Seizures (HCC), and Sleep apnea.   They are presenting to PM&R clinic as a new patient for pain management evaluation. They were referred by Harriette Bouillon, PA for treatment of chronic pain.  Patient reports her primary pain is in her right shoulder.  Pain is worse with activity and use of her shoulder.  She cannot lie on her right side due to this pain.  Pain is sharp, aching, stabbing and overall constant.   She was seen by Dr. August Saucer on 04/06/2023 and had a ultrasound-guided shoulder injection with methylprednisolone.  She reports this did help her pain for short time but pain has returned.  Patient was not interested in shoulder surgery or other joint surgery at this time.   She also has some pain in her right hip and left knee.  She feels like this is due to her altered gait.  She uses a cane and walker for ambulation.     She reports poor sleep at night.  Denies depression.   She is previously followed by [facility] for pain management and was treated with buprenorphine 10 mcg every 7 days and Oxycodone 10mg  as needed.   It appears by PDMP review that buprenorphine was discontinued and she was continued on Oxycodone 10mg  by Sanjuan Dame until Feb 2023.  Patient reports she moved to [facility] at this time and had not established with a different pain clinic.  She reports the oxycodone was very helpful for her pain and allowed her to be much more active and use her arm for activities.  She denies any  side effects with the medication.     Red flag symptoms: No red flags for back pain endorsed in Hx or ROS   Medications tried: lidocaine patch help  Topical medications  Nsaids Advil doesn't work Tylenol   didn't help  Opiates  Hydrocodone- didn't help much  Tramadol- stopped helping  Gabapentin / Lyrica  Denies  TCAs  Denies  SNRIs- Denies      Other treatments: PT- Did not help  TENs unit- Denies  Injections- Cortisone injections- dont help  Surgery   Rotator cuff sugery many years ago     Interval history 06/11/2023 Patient is here for follow-up regarding her chronic right shoulder, right hip and bilateral knee pain.  She report she continues to have severe pain in her right shoulder worsened with any kind of movement.  Today she feels that her right knee has been causing her more pain than her left knee.  She does not recall her right hip being a significant source of pain recently, however she has a family member with her who reports that patient continues to complain of right hip pain worsened with ambulation.  She has tried duloxetine but has not noticed a significant change in her pain levels.    Interval history 07/23/23 Patient left knee pain is greater than she had constant severe pain in her right shoulder.  She also has a pain in her  right hip and both knees.  Today she reports left knee pain has been worse than right knee pain.  Patient has tried oxycodone 5 mg, this provides mild benefit but has not been strong enough to keep her pain controlled.  She has occasionally had constipation- chronic issue, had recent bowel movement after use of laxative.  She does not use regular stool softeners.    Prior UDS results: No results found for: "LABOPIA", "COCAINSCRNUR", "LABBENZ", "AMPHETMU", "THCU", "LABBARB"    Pain Inventory Average Pain 8 Pain Right Now 8 My pain is constant, sharp, burning, stabbing, tingling, and aching  In the last 24 hours, has pain interfered  with the following? General activity 8 Relation with others 8 Enjoyment of life 8 What TIME of day is your pain at its worst? morning , daytime, evening, and night Sleep (in general) Fair  Pain is worse with: some activites Pain improves with:  nothing Relief from Meds: 4    Family History  Problem Relation Age of Onset   Pancreatic cancer Mother    Thyroid cancer Sister    Colon polyps Daughter    Kidney cancer Daughter    Sleep apnea Daughter    Breast cancer Maternal Aunt 73   Seizures Paternal Grandmother    Cancer Brother        tongue   Sleep apnea Son    Stroke Son    Seizures Son    Sleep apnea Son    Seizures Grandson    Colon cancer Neg Hx    Crohn's disease Neg Hx    Esophageal cancer Neg Hx    Rectal cancer Neg Hx    Stomach cancer Neg Hx    Ulcerative colitis Neg Hx    Dementia Neg Hx    Social History   Socioeconomic History   Marital status: Widowed    Spouse name: Not on file   Number of children: 6   Years of education: 54   Highest education level: Not on file  Occupational History   Not on file  Tobacco Use   Smoking status: Never   Smokeless tobacco: Never  Vaping Use   Vaping status: Never Used  Substance and Sexual Activity   Alcohol use: Never   Drug use: Never   Sexual activity: Not on file  Other Topics Concern   Not on file  Social History Narrative   Lives at home with daughter   Right handed   Caffeine: none    Social Drivers of Corporate investment banker Strain: Low Risk  (02/16/2023)   Overall Financial Resource Strain (CARDIA)    Difficulty of Paying Living Expenses: Not hard at all  Food Insecurity: No Food Insecurity (02/16/2023)   Hunger Vital Sign    Worried About Running Out of Food in the Last Year: Never true    Ran Out of Food in the Last Year: Never true  Transportation Needs: No Transportation Needs (02/16/2023)   PRAPARE - Administrator, Civil Service (Medical): No    Lack of Transportation  (Non-Medical): No  Physical Activity: Inactive (02/16/2023)   Exercise Vital Sign    Days of Exercise per Week: 0 days    Minutes of Exercise per Session: 0 min  Stress: No Stress Concern Present (02/16/2023)   [employer] of Occupational Health - Occupational Stress Questionnaire    Feeling of Stress : Not at all  Social Connections: Moderately Integrated (02/16/2023)   Social Connection and Isolation Panel [  NHANES]    Frequency of Communication with Friends and Family: More than three times a week    Frequency of Social Gatherings with Friends and Family: More than three times a week    Attends Religious Services: More than 4 times per year    Active Member of [employer] or Organizations: Yes    Attends Banker Meetings: More than 4 times per year    Marital Status: Widowed   Past Surgical History:  Procedure Laterality Date   BREAST BIOPSY Right    benign   CHOLECYSTECTOMY     left and right rotator cuff     Past Medical History:  Diagnosis Date   Acid reflux    Allergy    Anxiety    DDD (degenerative disc disease), lumbar    Depression    Facet joint disease    GERD (gastroesophageal reflux disease)    High cholesterol    Hypertension    Low vitamin D level    OSA (obstructive sleep apnea)    Primary osteoarthritis involving multiple joints    Seizures (HCC)    Sleep apnea    C PAP   BP 127/77   Pulse 70   Ht 5\' 2"  (1.575 m)   Wt 205 lb (93 kg)   SpO2 97%   BMI 37.49 kg/m   Opioid Risk Score:   Fall Risk Score:  `1  Depression screen PHQ 2/9     07/23/2023    3:00 PM 06/11/2023   10:01 AM 05/18/2023   10:03 AM 05/04/2023    8:15 AM 03/05/2023    3:30 PM 02/16/2023    3:17 PM 12/07/2022    8:14 AM  Depression screen PHQ 2/9  Decreased Interest 0 0 0 0 0 0 1  Down, Depressed, Hopeless 0 0 0 0 0 0 0  PHQ - 2 Score 0 0 0 0 0 0 1  Altered sleeping   0 0 0  1  Tired, decreased energy   0 0 0  3  Change in appetite   0 0 0  3  Feeling bad or  failure about yourself    0 0 0  0  Trouble concentrating   0 0 0  0  Moving slowly or fidgety/restless   0 0 0  0  Suicidal thoughts   0 0 0  0  PHQ-9 Score   0 0 0  8  Difficult doing work/chores    Not difficult at all Not difficult at all  Not difficult at all    Review of Systems  Constitutional: Negative.   HENT: Negative.    Respiratory:  Positive for apnea.   Cardiovascular: Negative.   Gastrointestinal: Negative.        Liquid stools - Gallbladder removed years ago  Endocrine: Negative.   Genitourinary: Negative.   Musculoskeletal:  Positive for gait problem.       Get off balance & out of breath / right knee and right shoulder  Skin: Negative.   Allergic/Immunologic: Negative.   Hematological: Negative.   Psychiatric/Behavioral: Negative.    All other systems reviewed and are negative.      Objective:   Physical Exam   Gen: no distress, normal appearing HEENT: oral mucosa pink and moist, NCAT Chest: normal effort, normal rate of breathing Abd: soft, non-distended Ext: no edema Psych: pleasant, normal affect Skin: intact Neuro: Alert and awake, follows simple commands, hard of hearing, memory deficits present- unchanged  RUE:  4-/5 Deltoid-limited by pain, 5/5 Biceps, 5/5 Triceps,  5/5 Grip LUE: 5/5 Deltoid, 5/5 Biceps, 5/5 Triceps, 5/5 Grip RLE: HF 5/5, KE 5/5, KF 5/5, ADF 5/5, APF 5/5 LLE: HF 5/5, KE 5/5, HF, 5/5, ADF 5/5, APF 5/5 Sensory exam intact for light touch and pain in all 4 limbs.   Musculoskeletal:  Pain R shoulder with passive range of motion in abduction, forward flexion, internal and external rotation Decreased passive and active range of motion in her right shoulder and abduction, internal and external rotation Patient has tenderness throughout her anterior posterior and lateral shoulder. Pain  with right hip internal and external rotation- unchanged +TTP b/l knee joint line No significant cervical or lumbar paraspinal  tenderness   Shoulder R xray 03/16/23 The transscapular and axillary views are limited. Significant degenerative change in the glenohumeral joint with osteophytes off the inferior glenoid and medial humeral head. AC joint degenerative change. No fracture. No evidence of dislocation. No other bony or soft tissue abnormalities identified.   IMPRESSION: Degenerative changes as above. No fracture or dislocation identified.    C spine CT 12/11/21 Alignment: Normal.   Skull base and vertebrae: No acute fracture. No primary bone lesion or focal pathologic process.   Soft tissues and spinal canal: No prevertebral fluid or swelling. No visible canal hematoma.   Disc levels: Multilevel degenerative disc disease/spondylosis and RIGHT facet arthropathy noted. The following findings are noted:   C2-3: A small central disc protrusion is noted with moderate RIGHT bony foraminal narrowing   C3-4: Mild central spinal and moderate RIGHT bony foraminal narrowing   C4-5: Moderate central spinal and mild to moderate RIGHT bony foraminal narrowing   C5-C6: With broad-based disc bulge, mild to moderate central spinal and moderate bony biforaminal narrowing   C6-7: With broad-based disc bulge, mild central spinal, mild RIGHT and moderate LEFT bony foraminal narrowing   Upper chest: No acute abnormality   Other: None   IMPRESSION: 1. No evidence of acute intracranial abnormality. 2. No static evidence of acute injury to the cervical spine. Degenerative changes as described.       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  Continue PDMP monitoring.  Pain contract completed prior visit. -Increase Oxycodone to  7.5mg  PRN today, she used oxycodone 10mg  in the past -Continue  duloxetine 20mg  daily for MSK pain, consider trying higher dose at future visit  -Pain Journal Sheet provided last visit  -Family is monitoring her medication use and only giving her medication when she is reporting significant pain-I think this is a good idea to continue -Xray B/L Knees completed - OA noted  -Discussed knee injection -schedule knee injection cortisone for L knee next visit.  She previously indicated that her right knee was more painful with today she indicates left knee is causing more discomfort. -Colace daily for stool softener discussed, miralax if stronger medication needed

## 2023-07-24 MED ORDER — POTASSIUM CHLORIDE ER 10 MEQ PO TBCR: 10.0000 meq | EXTENDED_RELEASE_TABLET | Freq: Every day | ORAL | 0 refills | Status: DC

## 2023-07-24 NOTE — Addendum Note (Signed)
 Addended by: Gershon Crane A on: 07/24/2023 12:35 PM   Modules accepted: Orders

## 2023-07-24 NOTE — Telephone Encounter (Signed)
 I sent in for a 90 day supply

## 2023-07-27 ENCOUNTER — Telehealth: Payer: Self-pay

## 2023-07-27 MED ORDER — OXYCODONE HCL 5 MG PO TABS: 5.0000 mg | ORAL_TABLET | Freq: Two times a day (BID) | ORAL | 0 refills | Status: DC | PRN

## 2023-07-27 NOTE — Telephone Encounter (Signed)
 Pharmacy faxed over information saying the oxycodone 7.5 is not covered, they are asking if a new Rx can be sent for 5mg  or 10mg 

## 2023-08-01 ENCOUNTER — Other Ambulatory Visit: Payer: Self-pay | Admitting: Family Medicine

## 2023-08-01 DIAGNOSIS — J302 Other seasonal allergic rhinitis: Secondary | ICD-10-CM

## 2023-09-03 ENCOUNTER — Encounter: Payer: Self-pay | Admitting: Physical Medicine & Rehabilitation

## 2023-09-03 ENCOUNTER — Encounter: Payer: Medicare HMO | Attending: Physical Medicine & Rehabilitation | Admitting: Physical Medicine & Rehabilitation

## 2023-09-03 VITALS — BP 139/68 | HR 68 | Ht 62.0 in | Wt 209.0 lb

## 2023-09-03 DIAGNOSIS — M19011 Primary osteoarthritis, right shoulder: Secondary | ICD-10-CM | POA: Diagnosis not present

## 2023-09-03 DIAGNOSIS — G8929 Other chronic pain: Secondary | ICD-10-CM | POA: Diagnosis not present

## 2023-09-03 DIAGNOSIS — M170 Bilateral primary osteoarthritis of knee: Secondary | ICD-10-CM | POA: Diagnosis not present

## 2023-09-03 DIAGNOSIS — M25562 Pain in left knee: Secondary | ICD-10-CM

## 2023-09-03 DIAGNOSIS — M25551 Pain in right hip: Secondary | ICD-10-CM | POA: Diagnosis not present

## 2023-09-03 DIAGNOSIS — Z79899 Other long term (current) drug therapy: Secondary | ICD-10-CM

## 2023-09-03 MED ORDER — TRIAMCINOLONE ACETONIDE 40 MG/ML IJ SUSP: 40.0000 mg | Freq: Once | INTRAMUSCULAR | Status: AC

## 2023-09-03 MED ORDER — LIDOCAINE HCL 1 % IJ SOLN: 1.0000 mL | Freq: Once | INTRAMUSCULAR | Status: AC

## 2023-09-03 NOTE — Progress Notes (Signed)
 Subjective:    Patient ID: Gina Key, female    DOB: 1946-02-01, 78 y.o.   MRN: [id number]  HPI      HPI   Gina Key is a 78 y.o. year old female  who  has a past medical history of Acid reflux, Allergy, Anxiety, DDD (degenerative disc disease), lumbar, Depression, Facet joint disease, GERD (gastroesophageal reflux disease), High cholesterol, Hypertension, Low vitamin D  level, OSA (obstructive sleep apnea), Primary osteoarthritis involving multiple joints, Seizures (HCC), and Sleep apnea.   They are presenting to PM&R clinic as a new patient for pain management evaluation. They were referred by Wells Halim, PA for treatment of chronic pain.  Patient reports her primary pain is in her right shoulder.  Pain is worse with activity and use of her shoulder.  She cannot lie on her right side due to this pain.  Pain is sharp, aching, stabbing and overall constant.   She was seen by Dr. Rozelle Corning on 04/06/2023 and had a ultrasound-guided shoulder injection with methylprednisolone .  She reports this did help her pain for short time but pain has returned.  Patient was not interested in shoulder surgery or other joint surgery at this time.   She also has some pain in her right hip and left knee.  She feels like this is due to her altered gait.  She uses a cane and walker for ambulation.     She reports poor sleep at night.  Denies depression.   She is previously followed by [facility] for pain management and was treated with buprenorphine 10 mcg every 7 days and Oxycodone  10mg  as needed.   It appears by PDMP review that buprenorphine was discontinued and she was continued on Oxycodone  10mg  by Minor Amble until Feb 2023.  Patient reports she moved to [facility] at this time and had not established with a different pain clinic.  She reports the oxycodone  was very helpful for her pain and allowed her to be much more active and use her arm for activities.  She denies any  side effects with the medication.     Red flag symptoms: No red flags for back pain endorsed in Hx or ROS   Medications tried: lidocaine  patch help  Topical medications  Nsaids Advil doesn't work Tylenol    didn't help  Opiates  Hydrocodone- didn't help much  Tramadol- stopped helping  Gabapentin / Lyrica  Denies  TCAs  Denies  SNRIs- Denies      Other treatments: PT- Did not help  TENs unit- Denies  Injections- Cortisone injections- dont help  Surgery   Rotator cuff sugery many years ago     Interval history 06/11/2023 Patient is here for follow-up regarding her chronic right shoulder, right hip and bilateral knee pain.  She report she continues to have severe pain in her right shoulder worsened with any kind of movement.  Today she feels that her right knee has been causing her more pain than her left knee.  She does not recall her right hip being a significant source of pain recently, however she has a family member with her who reports that patient continues to complain of right hip pain worsened with ambulation.  She has tried duloxetine  but has not noticed a significant change in her pain levels.    Interval history 07/23/23 Patient left knee pain is greater than she had constant severe pain in her right shoulder.  She also has a pain in her  right hip and both knees.  Today she reports left knee pain has been worse than right knee pain.  Patient has tried oxycodone  5 mg, this provides mild benefit but has not been strong enough to keep her pain controlled.  She has occasionally had constipation- chronic issue, had recent bowel movement after use of laxative.  She does not use regular stool softeners.  Interval history 09/03/23 Ms. Gina Key is here for a left knee injection.  She continues to have pain in her right shoulder, left knee greater than right knee, right hip.  She reports poor benefit with oxycodone  5 to 7.5 mg dose.  She tries to use the medication sparingly.  She says  pain was much more tolerable when she used 10 mg.  Denies any side effects with medication.   Prior UDS results: No results found for: "LABOPIA", "COCAINSCRNUR", "LABBENZ", "AMPHETMU", "THCU", "LABBARB"    Pain Inventory Average Pain 8 Pain Right Now 8 My pain is constant, sharp, burning, stabbing, tingling, and aching  In the last 24 hours, has pain interfered with the following? General activity 8 Relation with others 8 Enjoyment of life 8 What TIME of day is your pain at its worst? morning , daytime, evening, and night Sleep (in general) Fair  Pain is worse with: some activites Pain improves with:  nothing Relief from Meds: 4    Family History  Problem Relation Age of Onset   Pancreatic cancer Mother    Thyroid  cancer Sister    Colon polyps Daughter    Kidney cancer Daughter    Sleep apnea Daughter    Breast cancer Maternal Aunt 38   Seizures Paternal Grandmother    Cancer Brother        tongue   Sleep apnea Son    Stroke Son    Seizures Son    Sleep apnea Son    Seizures Grandson    Colon cancer Neg Hx    Crohn's disease Neg Hx    Esophageal cancer Neg Hx    Rectal cancer Neg Hx    Stomach cancer Neg Hx    Ulcerative colitis Neg Hx    Dementia Neg Hx    Social History   Socioeconomic History   Marital status: Widowed    Spouse name: Not on file   Number of children: 6   Years of education: 105   Highest education level: Not on file  Occupational History   Not on file  Tobacco Use   Smoking status: Never   Smokeless tobacco: Never  Vaping Use   Vaping status: Never Used  Substance and Sexual Activity   Alcohol use: Never   Drug use: Never   Sexual activity: Not on file  Other Topics Concern   Not on file  Social History Narrative   Lives at home with daughter   Right handed   Caffeine: none    Social Drivers of Corporate investment banker Strain: Low Risk  (02/16/2023)   Overall Financial Resource Strain (CARDIA)    Difficulty of Paying  Living Expenses: Not hard at all  Food Insecurity: No Food Insecurity (02/16/2023)   Hunger Vital Sign    Worried About Running Out of Food in the Last Year: Never true    Ran Out of Food in the Last Year: Never true  Transportation Needs: No Transportation Needs (02/16/2023)   PRAPARE - Administrator, Civil Service (Medical): No    Lack of Transportation (Non-Medical): No  Physical Activity: Inactive (02/16/2023)   Exercise Vital Sign    Days of Exercise per Week: 0 days    Minutes of Exercise per Session: 0 min  Stress: No Stress Concern Present (02/16/2023)   [employer] of Occupational Health - Occupational Stress Questionnaire    Feeling of Stress : Not at all  Social Connections: Moderately Integrated (02/16/2023)   Social Connection and Isolation Panel [NHANES]    Frequency of Communication with Friends and Family: More than three times a week    Frequency of Social Gatherings with Friends and Family: More than three times a week    Attends Religious Services: More than 4 times per year    Active Member of [employer] or Organizations: Yes    Attends Banker Meetings: More than 4 times per year    Marital Status: Widowed   Past Surgical History:  Procedure Laterality Date   BREAST BIOPSY Right    benign   CHOLECYSTECTOMY     left and right rotator cuff     Past Medical History:  Diagnosis Date   Acid reflux    Allergy    Anxiety    DDD (degenerative disc disease), lumbar    Depression    Facet joint disease    GERD (gastroesophageal reflux disease)    High cholesterol    Hypertension    Low vitamin D  level    OSA (obstructive sleep apnea)    Primary osteoarthritis involving multiple joints    Seizures (HCC)    Sleep apnea    C PAP   BP 139/68   Pulse 68   Ht 5\' 2"  (1.575 m)   Wt 209 lb (94.8 kg)   SpO2 100%   BMI 38.23 kg/m   Opioid Risk Score:   Fall Risk Score:  `1  Depression screen [facility] 2/9     09/03/2023    3:26 PM  07/23/2023    3:00 PM 06/11/2023   10:01 AM 05/18/2023   10:03 AM 05/04/2023    8:15 AM 03/05/2023    3:30 PM 02/16/2023    3:17 PM  Depression screen PHQ 2/9  Decreased Interest 0 0 0 0 0 0 0  Down, Depressed, Hopeless 0 0 0 0 0 0 0  PHQ - 2 Score 0 0 0 0 0 0 0  Altered sleeping    0 0 0   Tired, decreased energy    0 0 0   Change in appetite    0 0 0   Feeling bad or failure about yourself     0 0 0   Trouble concentrating    0 0 0   Moving slowly or fidgety/restless    0 0 0   Suicidal thoughts    0 0 0   PHQ-9 Score    0 0 0   Difficult doing work/chores     Not difficult at all Not difficult at all     Review of Systems  Constitutional: Negative.   HENT: Negative.    Respiratory:  Positive for apnea.   Cardiovascular: Negative.   Gastrointestinal: Negative.        Liquid stools - Gallbladder removed years ago  Endocrine: Negative.   Genitourinary: Negative.   Musculoskeletal:  Positive for gait problem.       Get off balance & out of breath / right knee and right shoulder  Skin: Negative.   Allergic/Immunologic: Negative.   Hematological: Negative.   Psychiatric/Behavioral: Negative.  All other systems reviewed and are negative.      Objective:   Physical Exam   Gen: no distress, normal appearing HEENT: oral mucosa pink and moist, NCAT Chest: normal effort, normal rate of breathing Abd: soft, non-distended Ext: no edema Psych: pleasant, normal affect Skin: intact Neuro: Alert and awake, follows simple commands, hard of hearing, memory deficits present- unchanged  RUE: 4-/5 Deltoid-limited by pain, 5/5 Biceps, 5/5 Triceps,  5/5 Grip LUE: 5/5 Deltoid, 5/5 Biceps, 5/5 Triceps, 5/5 Grip RLE: HF 5/5, KE 5/5, KF 5/5, ADF 5/5, APF 5/5 LLE: HF 5/5, KE 5/5, HF, 5/5, ADF 5/5, APF 5/5 Sensory exam intact for light touch and pain in all 4 limbs.   Musculoskeletal:  Right shoulder pain with passive ROM in all directions  Pain  with right hip internal and external  rotation- unchanged  +TTP b/l knee joint line left greater than right  No significant lumbar or cervical spine TTP   Shoulder R xray 03/16/23 The transscapular and axillary views are limited. Significant degenerative change in the glenohumeral joint with osteophytes off the inferior glenoid and medial humeral head. AC joint degenerative change. No fracture. No evidence of dislocation. No other bony or soft tissue abnormalities identified.   IMPRESSION: Degenerative changes as above. No fracture or dislocation identified.    C spine CT 12/11/21 Alignment: Normal.   Skull base and vertebrae: No acute fracture. No primary bone lesion or focal pathologic process.   Soft tissues and spinal canal: No prevertebral fluid or swelling. No visible canal hematoma.   Disc levels: Multilevel degenerative disc disease/spondylosis and RIGHT facet arthropathy noted. The following findings are noted:   C2-3: A small central disc protrusion is noted with moderate RIGHT bony foraminal narrowing   C3-4: Mild central spinal and moderate RIGHT bony foraminal narrowing   C4-5: Moderate central spinal and mild to moderate RIGHT bony foraminal narrowing   C5-C6: With broad-based disc bulge, mild to moderate central spinal and moderate bony biforaminal narrowing   C6-7: With broad-based disc bulge, mild central spinal, mild RIGHT and moderate LEFT bony foraminal narrowing   Upper chest: No acute abnormality   Other: None   IMPRESSION: 1. No evidence of acute intracranial abnormality. 2. No static evidence of acute injury to the cervical spine. Degenerative changes as described.       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  Continue PDMP monitoring.  Pain contract completed  prior visit. -Increase Oxycodone  to  5-10mg  BID PRN, she used oxycodone  10mg  in the past -Continue duloxetine  20mg  daily for MSK pain, consider trying higher dose at future visit  -Pain Journal Sheet provided prior visit -Family is monitoring her medication use and only giving her medication when she is reporting significant pain-I think this is a good idea to continue -Xray B/L Knees completed - OA noted  -Colace daily for stool softener discussed, miralax if stronger medication needed -Scheduled for right shoulder injection next visit  Left knee injection   Indication:Knee pain not relieved by medication management and other conservative care.  Informed consent was obtained after describing risks and benefits of the procedure with the patient, this includes bleeding, bruising, infection and medication side effects. The patient wishes to proceed and has given written consent. The patient was placed in  a recumbent position. The Lateral aspect of the knee was marked and prepped with Betadine and alcohol. It was then entered with a 25-gauge 1-1/2 inch needle.  After negative draw back for blood, a solution containing one ML of 40mg  per mL kenalog  and 1 mL of 1% lidocaine  were injected. The patient tolerated the procedure well. Post procedure instructions were given.

## 2023-09-27 ENCOUNTER — Other Ambulatory Visit: Payer: Self-pay | Admitting: Family Medicine

## 2023-09-27 ENCOUNTER — Ambulatory Visit (INDEPENDENT_AMBULATORY_CARE_PROVIDER_SITE_OTHER): Payer: Medicare HMO | Admitting: Family Medicine

## 2023-09-27 ENCOUNTER — Encounter: Payer: Self-pay | Admitting: Family Medicine

## 2023-09-27 VITALS — BP 134/78 | HR 71 | Temp 97.2°F | Ht 62.0 in | Wt 203.4 lb

## 2023-09-27 DIAGNOSIS — K5900 Constipation, unspecified: Secondary | ICD-10-CM | POA: Diagnosis not present

## 2023-09-27 DIAGNOSIS — G4733 Obstructive sleep apnea (adult) (pediatric): Secondary | ICD-10-CM

## 2023-09-27 DIAGNOSIS — R4189 Other symptoms and signs involving cognitive functions and awareness: Secondary | ICD-10-CM | POA: Diagnosis not present

## 2023-09-27 DIAGNOSIS — R32 Unspecified urinary incontinence: Secondary | ICD-10-CM | POA: Diagnosis not present

## 2023-09-27 DIAGNOSIS — I10 Essential (primary) hypertension: Secondary | ICD-10-CM | POA: Diagnosis not present

## 2023-09-27 DIAGNOSIS — E782 Mixed hyperlipidemia: Secondary | ICD-10-CM

## 2023-09-27 MED ORDER — WINGS HL ADULT BRIEFS/XL MISC: 11 refills | Status: AC

## 2023-09-27 NOTE — Progress Notes (Signed)
 Established Patient Office Visit   Subjective  Patient ID: Gina Key, female    DOB: [birth year]  Age: 78 y.o. MRN: [id number]  Chief Complaint  Patient presents with   Medical Management of Chronic Issues    2 month follow-up memory changes and BP   Pt accompanied by her daughter  Pt is a 78 yo female seen for f/u.  Pt with some improvement in bp.  Congratulated on wt loss.  Pt would like to lose more wt faster.  Pt with h/o urinary incontinence, requesting rx for adult briefs.  Memory stable.  Endorses continued constipation.  May drink prune juice which helps.  Thinks tried another med recommended, but does not remember if it worked well.  Increasing vegetable intake.      Patient Active Problem List   Diagnosis Date Noted   Osteopenia 12/14/2022   Essential hypertension 09/28/2021   Gastroesophageal reflux disease 09/28/2021   Seasonal allergies 09/28/2021   History of migraine 09/28/2021   Urinary incontinence 09/28/2021   Mixed hyperlipidemia 09/28/2021   History of seizure 09/28/2021   OSA (obstructive sleep apnea) 09/28/2021   Past Medical History:  Diagnosis Date   Acid reflux    Allergy    Anxiety    DDD (degenerative disc disease), lumbar    Depression    Facet joint disease    GERD (gastroesophageal reflux disease)    High cholesterol    Hypertension    Low vitamin D level    OSA (obstructive sleep apnea)    Primary osteoarthritis involving multiple joints    Seizures (HCC)    Sleep apnea    C PAP   Past Surgical History:  Procedure Laterality Date   BREAST BIOPSY Right    benign   CHOLECYSTECTOMY     left and right rotator cuff     Social History   Tobacco Use   Smoking status: Never   Smokeless tobacco: Never  Vaping Use   Vaping status: Never Used  Substance Use Topics   Alcohol use: Never   Drug use: Never   Family History  Problem Relation Age of Onset   Pancreatic cancer Mother    Thyroid cancer Sister    Colon polyps  Daughter    Kidney cancer Daughter    Sleep apnea Daughter    Breast cancer Maternal Aunt 21   Seizures Paternal Grandmother    Cancer Brother        tongue   Sleep apnea Son    Stroke Son    Seizures Son    Sleep apnea Son    Seizures Grandson    Colon cancer Neg Hx    Crohn's disease Neg Hx    Esophageal cancer Neg Hx    Rectal cancer Neg Hx    Stomach cancer Neg Hx    Ulcerative colitis Neg Hx    Dementia Neg Hx    No Known Allergies    ROS Negative unless stated above    Objective:     BP 134/78 (BP Location: Left Arm, Patient Position: Sitting, Cuff Size: Large)   Pulse 71   Temp (!) 97.2 F (36.2 C) (Oral)   Ht 5\' 2"  (1.575 m)   Wt 203 lb 6.4 oz (92.3 kg)   BMI 37.20 kg/m  BP Readings from Last 3 Encounters:  09/27/23 134/78  09/03/23 139/68  07/23/23 127/77   Wt Readings from Last 3 Encounters:  09/27/23 203 lb 6.4 oz (92.3 kg)  09/03/23  209 lb (94.8 kg)  07/23/23 205 lb (93 kg)   Physical Exam Constitutional:      General: She is not in acute distress.    Appearance: Normal appearance.  HENT:     Head: Normocephalic and atraumatic.     Nose: Nose normal.     Mouth/Throat:     Mouth: Mucous membranes are moist.  Cardiovascular:     Rate and Rhythm: Normal rate and regular rhythm.     Heart sounds: Normal heart sounds. No murmur heard.    No gallop.  Pulmonary:     Effort: Pulmonary effort is normal. No respiratory distress.     Breath sounds: Normal breath sounds. No wheezing, rhonchi or rales.  Skin:    General: Skin is warm and dry.  Neurological:     Mental Status: She is alert and oriented to person, place, and time.    No results found for any visits on 09/27/23.    Assessment & Plan:  Cognitive changes -stable -continue to monitor per neurology. -continue to offer meds, though declined by pt.  Constipation, unspecified constipation type -continue diet changes -continue prune juice prn. -also discussed other OTC and rx  options.  Urinary incontinence, unspecified type -     Wings HL Adult Briefs/XL; As needed daily.  Dispense: 100 each; Refill: 11  Essential hypertension -controlled -continue toprol xl 25 mg, spironolactone 25 mg daily, losartan 100 mg  OSA (obstructive sleep apnea) -continue CPAP   Return in about 4 months (around 01/27/2024) for chronic conditions.   Deeann Saint, MD

## 2023-10-03 ENCOUNTER — Ambulatory Visit (INDEPENDENT_AMBULATORY_CARE_PROVIDER_SITE_OTHER): Admitting: Family Medicine

## 2023-10-03 ENCOUNTER — Encounter: Payer: Self-pay | Admitting: Family Medicine

## 2023-10-03 ENCOUNTER — Ambulatory Visit (INDEPENDENT_AMBULATORY_CARE_PROVIDER_SITE_OTHER)

## 2023-10-03 VITALS — BP 130/72 | HR 71 | Temp 97.7°F | Ht 62.0 in | Wt 203.8 lb

## 2023-10-03 DIAGNOSIS — R5383 Other fatigue: Secondary | ICD-10-CM

## 2023-10-03 DIAGNOSIS — M545 Low back pain, unspecified: Secondary | ICD-10-CM | POA: Diagnosis not present

## 2023-10-03 DIAGNOSIS — R0602 Shortness of breath: Secondary | ICD-10-CM

## 2023-10-03 DIAGNOSIS — R350 Frequency of micturition: Secondary | ICD-10-CM | POA: Diagnosis not present

## 2023-10-03 DIAGNOSIS — R413 Other amnesia: Secondary | ICD-10-CM | POA: Diagnosis not present

## 2023-10-03 DIAGNOSIS — R0600 Dyspnea, unspecified: Secondary | ICD-10-CM | POA: Diagnosis not present

## 2023-10-03 DIAGNOSIS — R319 Hematuria, unspecified: Secondary | ICD-10-CM

## 2023-10-03 LAB — POCT URINALYSIS DIPSTICK
Bilirubin, UA: NEGATIVE
Blood, UA: POSITIVE
Glucose, UA: NEGATIVE
Ketones, UA: NEGATIVE
Leukocytes, UA: NEGATIVE
Nitrite, UA: NEGATIVE
Protein, UA: NEGATIVE
Spec Grav, UA: 1.01 (ref 1.010–1.025)
Urobilinogen, UA: 0.2 U/dL
pH, UA: 7 (ref 5.0–8.0)

## 2023-10-03 NOTE — Progress Notes (Unsigned)
 Established Patient Office Visit   Subjective  Patient ID: Gina Key, female    DOB: 12/03/1945  Age: 78 y.o. MRN: [id number]  Chief Complaint  Patient presents with   Fatigue  Pt accompanied by her daughter.  Patient is a 78 year old female seen for ongoing concern.  Patient endorses shortness of breath, fatigue, not feeling right.  States having urinary frequency/urgency.  Unable to hold urine.  Also notes left-sided low back pain.  Patient denies changes in medication, diet, LE edema, inability to lay flat at night, CP, changes in vision, weakness, wheezing.  Patient's daughter states he has been more tired walking short distances such as into clinic or when out shopping.  Has to take breaks.  Patient mostly sedentary.  Patient's daughter states patient's sister feels like there is been progression with patient's memory change.  Previously seen by neurology.  CT head 03/16/2023 with mild chronic small vessel changes.  Memory testing largely negative.  Advised to follow-up for any changes.    Patient Active Problem List   Diagnosis Date Noted   Acute pulmonary embolism (HCC) 10/05/2023   Hypernatremia 10/05/2023   Dyspnea on exertion 10/05/2023   Osteopenia 12/14/2022   Essential hypertension 09/28/2021   Gastroesophageal reflux disease 09/28/2021   Seasonal allergies 09/28/2021   History of migraine 09/28/2021   Urinary incontinence 09/28/2021   Hyperlipidemia 09/28/2021   History of seizure 09/28/2021   OSA (obstructive sleep apnea) 09/28/2021   Past Medical History:  Diagnosis Date   Acid reflux    Allergy    Anxiety    DDD (degenerative disc disease), lumbar    Depression    Facet joint disease    GERD (gastroesophageal reflux disease)    High cholesterol    Hypertension    Low vitamin D level    OSA (obstructive sleep apnea)    Primary osteoarthritis involving multiple joints    Seizures (HCC)    Sleep apnea    C PAP   Past Surgical History:  Procedure  Laterality Date   BREAST BIOPSY Right    benign   CHOLECYSTECTOMY     left and right rotator cuff     Social History   Tobacco Use   Smoking status: Never   Smokeless tobacco: Never  Vaping Use   Vaping status: Never Used  Substance Use Topics   Alcohol use: Never   Drug use: Never   Family History  Problem Relation Age of Onset   Pancreatic cancer Mother    Thyroid cancer Sister    Colon polyps Daughter    Kidney cancer Daughter    Sleep apnea Daughter    Breast cancer Maternal Aunt 60   Seizures Paternal Grandmother    Cancer Brother        tongue   Sleep apnea Son    Stroke Son    Seizures Son    Sleep apnea Son    Seizures Grandson    Colon cancer Neg Hx    Crohn's disease Neg Hx    Esophageal cancer Neg Hx    Rectal cancer Neg Hx    Stomach cancer Neg Hx    Ulcerative colitis Neg Hx    Dementia Neg Hx    No Known Allergies    ROS Negative unless stated above    Objective:     BP 130/72 (BP Location: Left Arm, Patient Position: Sitting, Cuff Size: Normal)   Pulse 71   Temp 97.7 F (36.5 C) (Oral)  Ht 5\' 2"  (1.575 m)   Wt 203 lb 12.8 oz (92.4 kg)   BMI 37.28 kg/m  BP Readings from Last 3 Encounters:  10/05/23 131/72  10/03/23 130/72  09/27/23 134/78   Wt Readings from Last 3 Encounters:  10/03/23 203 lb 12.8 oz (92.4 kg)  09/27/23 203 lb 6.4 oz (92.3 kg)  09/03/23 209 lb (94.8 kg)      Physical Exam Constitutional:      General: She is not in acute distress.    Appearance: Normal appearance.  HENT:     Head: Normocephalic and atraumatic.     Nose: Nose normal.     Mouth/Throat:     Mouth: Mucous membranes are moist.  Cardiovascular:     Rate and Rhythm: Normal rate and regular rhythm.     Heart sounds: Normal heart sounds. No murmur heard.    No gallop.  Pulmonary:     Effort: Pulmonary effort is normal. No respiratory distress.     Breath sounds: Normal breath sounds. No wheezing, rhonchi or rales.  Skin:    General: Skin is  warm and dry.  Neurological:     Mental Status: She is alert and oriented to person, place, and time.      Results for orders placed or performed in visit on 10/03/23  Urine Culture   Specimen: Blood  Result Value Ref Range   MICRO NUMBER: [id number]    SPECIMEN QUALITY: Adequate    Sample Source NOT GIVEN    STATUS: FINAL    Result: No Growth   CMP  Result Value Ref Range   Sodium 144 135 - 145 mEq/L   Potassium 3.9 3.5 - 5.1 mEq/L   Chloride 112 96 - 112 mEq/L   CO2 25 19 - 32 mEq/L   Glucose, Bld 73 70 - 99 mg/dL   BUN 14 6 - 23 mg/dL   Creatinine, Ser 5.40 0.40 - 1.20 mg/dL   Total Bilirubin 0.4 0.2 - 1.2 mg/dL   Alkaline Phosphatase 126 (H) 39 - 117 U/L   AST 23 0 - 37 U/L   ALT 20 0 - 35 U/L   Total Protein 7.4 6.0 - 8.3 g/dL   Albumin 4.5 3.5 - 5.2 g/dL   GFR 98.11 >91.47 mL/min   Calcium 10.1 8.4 - 10.5 mg/dL  CBC with Differential/Platelet  Result Value Ref Range   WBC 8.3 4.0 - 10.5 K/uL   RBC 4.66 3.87 - 5.11 Mil/uL   Hemoglobin 12.5 12.0 - 15.0 g/dL   HCT 82.9 56.2 - 13.0 %   MCV 85.3 78.0 - 100.0 fl   MCHC 31.5 30.0 - 36.0 g/dL   RDW 86.5 78.4 - 69.6 %   Platelets 180.0 150.0 - 400.0 K/uL   Neutrophils Relative % 56.5 43.0 - 77.0 %   Lymphocytes Relative 35.6 12.0 - 46.0 %   Monocytes Relative 5.4 3.0 - 12.0 %   Eosinophils Relative 1.8 0.0 - 5.0 %   Basophils Relative 0.7 0.0 - 3.0 %   Neutro Abs 4.7 1.4 - 7.7 K/uL   Lymphs Abs 2.9 0.7 - 4.0 K/uL   Monocytes Absolute 0.4 0.1 - 1.0 K/uL   Eosinophils Absolute 0.1 0.0 - 0.7 K/uL   Basophils Absolute 0.1 0.0 - 0.1 K/uL  Urinalysis with Reflex Microscopic  Result Value Ref Range   Color, Urine YELLOW Yellow;Lt. Yellow;Straw;Dark Yellow;Amber;Green;Red;Brown   APPearance CLEAR Clear;Turbid;Slightly Cloudy;Cloudy   Specific Gravity, Urine <=1.005 (A) 1.000 - 1.030   pH  7.0 5.0 - 8.0   Total Protein, Urine NEGATIVE Negative   Urine Glucose NEGATIVE Negative   Ketones, ur NEGATIVE Negative   Bilirubin  Urine NEGATIVE Negative   Hgb urine dipstick TRACE-INTACT (A) Negative   Urobilinogen, UA 0.2 0.0 - 1.0   Leukocytes,Ua NEGATIVE Negative   Nitrite NEGATIVE Negative   WBC, UA none seen 0-2/hpf   RBC / HPF 0-2/hpf 0-2/hpf   Squamous Epithelial / HPF Rare(0-4/hpf) Rare(0-4/hpf)  TSH  Result Value Ref Range   TSH 0.97 0.35 - 5.50 uIU/mL  Vitamin B12  Result Value Ref Range   Vitamin B-12 538 211 - 911 pg/mL  Brain Natriuretic Peptide  Result Value Ref Range   Pro B Natriuretic peptide (BNP) 28.0 0.0 - 100.0 pg/mL  POCT urinalysis dipstick  Result Value Ref Range   Color, UA yellow    Clarity, UA clear    Glucose, UA Negative Negative   Bilirubin, UA neg    Ketones, UA neg    Spec Grav, UA 1.010 1.010 - 1.025   Blood, UA pos    pH, UA 7.0 5.0 - 8.0   Protein, UA Negative Negative   Urobilinogen, UA 0.2 0.2 or 1.0 E.U./dL   Nitrite, UA neg    Leukocytes, UA Negative Negative   Appearance     Odor        Assessment & Plan:  Fatigue, unspecified type -     POCT urinalysis dipstick -     Comprehensive metabolic panel -     CBC with Differential/Platelet -     TSH -     Vitamin B12 -     Brain natriuretic peptide -     Iron, TIBC and Ferritin Panel -     DG Chest 2 View; Future  Urinary frequency -     POCT urinalysis dipstick -     Urine Culture -     Urinalysis, Routine w reflex microscopic -     CT RENAL STONE STUDY; Future  Hematuria, unspecified type -     Urine Culture -     Urinalysis, Routine w reflex microscopic -     CT RENAL STONE STUDY; Future  Memory change -     Comprehensive metabolic panel -     CBC with Differential/Platelet -     TSH -     Vitamin B12  Acute left-sided low back pain without sciatica -     CT RENAL STONE STUDY; Future  SOB (shortness of breath) -     Brain natriuretic peptide -     Iron, TIBC and Ferritin Panel -     Ambulatory referral to Pulmonology -     DG Chest 2 View; Future  Pt with vague symptoms of fatigue,  urinary frequency and shortness of breath.  Discussed obtaining POC UA to rule out UTI.  6-minute walk test attempted however patient had to stop a short time after starting walk due to fatigue.  Also unable to get an accurate pO2 reading due to acrylic nail polish.  Hematuria noted on UA.  Discussed possibility of renal calculi.  Will obtain labs and order placed for CT stone study. Referral to pulmonology also placed.  Given strict precautions.  Return if symptoms worsen or fail to improve.   Deeann Saint, MD

## 2023-10-04 ENCOUNTER — Encounter (HOSPITAL_COMMUNITY): Payer: Self-pay | Admitting: Emergency Medicine

## 2023-10-04 ENCOUNTER — Other Ambulatory Visit: Payer: Self-pay

## 2023-10-04 ENCOUNTER — Emergency Department (HOSPITAL_COMMUNITY)

## 2023-10-04 ENCOUNTER — Inpatient Hospital Stay (HOSPITAL_COMMUNITY)
Admission: EM | Admit: 2023-10-04 | Discharge: 2023-10-06 | DRG: 176 | Disposition: A | Discharge status: Home / Self Care | Attending: Internal Medicine | Admitting: Internal Medicine

## 2023-10-04 ENCOUNTER — Ambulatory Visit: Payer: Self-pay | Admitting: Family Medicine

## 2023-10-04 ENCOUNTER — Ambulatory Visit
Admission: RE | Admit: 2023-10-04 | Discharge: 2023-10-04 | Disposition: A | Discharge status: Home / Self Care | Source: Ambulatory Visit | Attending: Family Medicine | Admitting: Family Medicine

## 2023-10-04 DIAGNOSIS — E669 Obesity, unspecified: Secondary | ICD-10-CM | POA: Diagnosis present

## 2023-10-04 DIAGNOSIS — I2699 Other pulmonary embolism without acute cor pulmonale: Secondary | ICD-10-CM

## 2023-10-04 DIAGNOSIS — G4733 Obstructive sleep apnea (adult) (pediatric): Secondary | ICD-10-CM | POA: Diagnosis present

## 2023-10-04 DIAGNOSIS — Z79899 Other long term (current) drug therapy: Secondary | ICD-10-CM

## 2023-10-04 DIAGNOSIS — Z800 Family history of malignant neoplasm of digestive organs: Secondary | ICD-10-CM

## 2023-10-04 DIAGNOSIS — E871 Hypo-osmolality and hyponatremia: Secondary | ICD-10-CM | POA: Diagnosis present

## 2023-10-04 DIAGNOSIS — Z9049 Acquired absence of other specified parts of digestive tract: Secondary | ICD-10-CM

## 2023-10-04 DIAGNOSIS — Z743 Need for continuous supervision: Secondary | ICD-10-CM | POA: Diagnosis not present

## 2023-10-04 DIAGNOSIS — M545 Low back pain, unspecified: Secondary | ICD-10-CM

## 2023-10-04 DIAGNOSIS — E870 Hyperosmolality and hypernatremia: Secondary | ICD-10-CM | POA: Diagnosis present

## 2023-10-04 DIAGNOSIS — E785 Hyperlipidemia, unspecified: Secondary | ICD-10-CM | POA: Diagnosis present

## 2023-10-04 DIAGNOSIS — Z8051 Family history of malignant neoplasm of kidney: Secondary | ICD-10-CM

## 2023-10-04 DIAGNOSIS — Z823 Family history of stroke: Secondary | ICD-10-CM

## 2023-10-04 DIAGNOSIS — Z87898 Personal history of other specified conditions: Secondary | ICD-10-CM

## 2023-10-04 DIAGNOSIS — Z6837 Body mass index (BMI) 37.0-37.9, adult: Secondary | ICD-10-CM

## 2023-10-04 DIAGNOSIS — I82432 Acute embolism and thrombosis of left popliteal vein: Secondary | ICD-10-CM | POA: Diagnosis present

## 2023-10-04 DIAGNOSIS — K219 Gastro-esophageal reflux disease without esophagitis: Secondary | ICD-10-CM | POA: Diagnosis present

## 2023-10-04 DIAGNOSIS — G40909 Epilepsy, unspecified, not intractable, without status epilepticus: Secondary | ICD-10-CM | POA: Diagnosis present

## 2023-10-04 DIAGNOSIS — Z808 Family history of malignant neoplasm of other organs or systems: Secondary | ICD-10-CM

## 2023-10-04 DIAGNOSIS — Z7982 Long term (current) use of aspirin: Secondary | ICD-10-CM

## 2023-10-04 DIAGNOSIS — Z91199 Patient's noncompliance with other medical treatment and regimen due to unspecified reason: Secondary | ICD-10-CM

## 2023-10-04 DIAGNOSIS — I2609 Other pulmonary embolism with acute cor pulmonale: Principal | ICD-10-CM | POA: Diagnosis present

## 2023-10-04 DIAGNOSIS — R0609 Other forms of dyspnea: Secondary | ICD-10-CM | POA: Insufficient documentation

## 2023-10-04 DIAGNOSIS — I700 Atherosclerosis of aorta: Secondary | ICD-10-CM | POA: Diagnosis present

## 2023-10-04 DIAGNOSIS — I10 Essential (primary) hypertension: Secondary | ICD-10-CM | POA: Diagnosis present

## 2023-10-04 DIAGNOSIS — K769 Liver disease, unspecified: Secondary | ICD-10-CM | POA: Diagnosis not present

## 2023-10-04 DIAGNOSIS — R0600 Dyspnea, unspecified: Secondary | ICD-10-CM | POA: Diagnosis not present

## 2023-10-04 DIAGNOSIS — R519 Headache, unspecified: Secondary | ICD-10-CM | POA: Diagnosis present

## 2023-10-04 DIAGNOSIS — E7800 Pure hypercholesterolemia, unspecified: Secondary | ICD-10-CM | POA: Diagnosis present

## 2023-10-04 DIAGNOSIS — R319 Hematuria, unspecified: Secondary | ICD-10-CM | POA: Diagnosis not present

## 2023-10-04 DIAGNOSIS — R350 Frequency of micturition: Secondary | ICD-10-CM

## 2023-10-04 DIAGNOSIS — F419 Anxiety disorder, unspecified: Secondary | ICD-10-CM | POA: Diagnosis present

## 2023-10-04 DIAGNOSIS — K5909 Other constipation: Secondary | ICD-10-CM | POA: Diagnosis present

## 2023-10-04 DIAGNOSIS — Z83719 Family history of colon polyps, unspecified: Secondary | ICD-10-CM

## 2023-10-04 DIAGNOSIS — Z803 Family history of malignant neoplasm of breast: Secondary | ICD-10-CM

## 2023-10-04 DIAGNOSIS — R0602 Shortness of breath: Secondary | ICD-10-CM | POA: Diagnosis not present

## 2023-10-04 DIAGNOSIS — I771 Stricture of artery: Secondary | ICD-10-CM | POA: Diagnosis not present

## 2023-10-04 LAB — I-STAT CHEM 8, ED
BUN: 15 mg/dL (ref 8–23)
Calcium, Ion: 1.32 mmol/L (ref 1.15–1.40)
Chloride: 114 mmol/L — ABNORMAL HIGH (ref 98–111)
Creatinine, Ser: 0.9 mg/dL (ref 0.44–1.00)
Glucose, Bld: 101 mg/dL — ABNORMAL HIGH (ref 70–99)
HCT: 36 % (ref 36.0–46.0)
Hemoglobin: 12.2 g/dL (ref 12.0–15.0)
Potassium: 3.9 mmol/L (ref 3.5–5.1)
Sodium: 149 mmol/L — ABNORMAL HIGH (ref 135–145)
TCO2: 24 mmol/L (ref 22–32)

## 2023-10-04 LAB — BASIC METABOLIC PANEL
Anion gap: 5 (ref 5–15)
BUN: 16 mg/dL (ref 8–23)
CO2: 25 mmol/L (ref 22–32)
Calcium: 9.3 mg/dL (ref 8.9–10.3)
Chloride: 115 mmol/L — ABNORMAL HIGH (ref 98–111)
Creatinine, Ser: 0.83 mg/dL (ref 0.44–1.00)
GFR, Estimated: >60 mL/min (ref >60)
Glucose, Bld: 105 mg/dL — ABNORMAL HIGH (ref 70–99)
Potassium: 3.9 mmol/L (ref 3.5–5.1)
Sodium: 145 mmol/L (ref 135–145)

## 2023-10-04 LAB — COMPREHENSIVE METABOLIC PANEL
ALT: 20 U/L (ref 0–35)
AST: 23 U/L (ref 0–37)
Albumin: 4.5 g/dL (ref 3.5–5.2)
Alkaline Phosphatase: 126 U/L — ABNORMAL HIGH (ref 39–117)
BUN: 14 mg/dL (ref 6–23)
CO2: 25 meq/L (ref 19–32)
Calcium: 10.1 mg/dL (ref 8.4–10.5)
Chloride: 112 meq/L (ref 96–112)
Creatinine, Ser: 0.91 mg/dL (ref 0.40–1.20)
GFR: 61.1 mL/min (ref >60.00)
Glucose, Bld: 73 mg/dL (ref 70–99)
Potassium: 3.9 meq/L (ref 3.5–5.1)
Sodium: 144 meq/L (ref 135–145)
Total Bilirubin: 0.4 mg/dL (ref 0.2–1.2)
Total Protein: 7.4 g/dL (ref 6.0–8.3)

## 2023-10-04 LAB — URINALYSIS, ROUTINE W REFLEX MICROSCOPIC
Bilirubin Urine: NEGATIVE
Ketones, ur: NEGATIVE
Leukocytes,Ua: NEGATIVE
Nitrite: NEGATIVE
Specific Gravity, Urine: <=1.005 — AB (ref 1.000–1.030)
Total Protein, Urine: NEGATIVE
Urine Glucose: NEGATIVE
Urobilinogen, UA: 0.2 (ref 0.0–1.0)
WBC, UA: NONE SEEN (ref 0–?)
pH: 7 (ref 5.0–8.0)

## 2023-10-04 LAB — CBC WITH DIFFERENTIAL/PLATELET
Abs Immature Granulocytes: 0.02 10*3/uL (ref 0.00–0.07)
Basophils Absolute: 0.1 10*3/uL (ref 0.0–0.1)
Basophils Absolute: 0 10*3/uL (ref 0.0–0.1)
Basophils Relative: 0.7 % (ref 0.0–3.0)
Basophils Relative: 0 %
Eosinophils Absolute: 0.1 10*3/uL (ref 0.0–0.7)
Eosinophils Absolute: 0.2 10*3/uL (ref 0.0–0.5)
Eosinophils Relative: 1.8 % (ref 0.0–5.0)
Eosinophils Relative: 2 %
HCT: 39.8 % (ref 36.0–46.0)
HCT: 39.4 % (ref 36.0–46.0)
Hemoglobin: 12.5 g/dL (ref 12.0–15.0)
Hemoglobin: 11.7 g/dL — ABNORMAL LOW (ref 12.0–15.0)
Immature Granulocytes: 0 %
Lymphocytes Relative: 35.6 % (ref 12.0–46.0)
Lymphocytes Relative: 36 %
Lymphs Abs: 2.9 10*3/uL (ref 0.7–4.0)
Lymphs Abs: 3.3 10*3/uL (ref 0.7–4.0)
MCH: 26.7 pg (ref 26.0–34.0)
MCHC: 31.5 g/dL (ref 30.0–36.0)
MCHC: 29.7 g/dL — ABNORMAL LOW (ref 30.0–36.0)
MCV: 85.3 fl (ref 78.0–100.0)
MCV: 90 fL (ref 80.0–100.0)
Monocytes Absolute: 0.4 10*3/uL (ref 0.1–1.0)
Monocytes Absolute: 0.6 10*3/uL (ref 0.1–1.0)
Monocytes Relative: 5.4 % (ref 3.0–12.0)
Monocytes Relative: 6 %
Neutro Abs: 4.7 10*3/uL (ref 1.4–7.7)
Neutro Abs: 5.1 10*3/uL (ref 1.7–7.7)
Neutrophils Relative %: 56.5 % (ref 43.0–77.0)
Neutrophils Relative %: 56 %
Platelets: 180 10*3/uL (ref 150.0–400.0)
Platelets: 163 10*3/uL (ref 150–400)
RBC: 4.66 Mil/uL (ref 3.87–5.11)
RBC: 4.38 MIL/uL (ref 3.87–5.11)
RDW: 13.6 % (ref 11.5–15.5)
RDW: 13 % (ref 11.5–15.5)
WBC: 8.3 10*3/uL (ref 4.0–10.5)
WBC: 9.2 10*3/uL (ref 4.0–10.5)
nRBC: 0 % (ref 0.0–0.2)

## 2023-10-04 LAB — VITAMIN B12: Vitamin B-12: 538 pg/mL (ref 211–911)

## 2023-10-04 LAB — TSH: TSH: 0.97 u[IU]/mL (ref 0.35–5.50)

## 2023-10-04 LAB — BRAIN NATRIURETIC PEPTIDE: Pro B Natriuretic peptide (BNP): 28 pg/mL (ref 0.0–100.0)

## 2023-10-04 MED ORDER — ALBUTEROL SULFATE HFA 108 (90 BASE) MCG/ACT IN AERS: 2.0000 | INHALATION_SPRAY | RESPIRATORY_TRACT | Status: DC | PRN

## 2023-10-04 MED ORDER — IOHEXOL 350 MG/ML SOLN
80.0000 mL | Freq: Once | INTRAVENOUS | Status: AC | PRN
  Administered 2023-10-04: 80 mL via INTRAVENOUS

## 2023-10-04 NOTE — ED Triage Notes (Signed)
 Patient presents due to shortness of breath upon waking and with exertion. She has been evaluated by her PCP yesterday for this and was referred to a pulmonologist. Family believes symptoms have worsened. Patient wears a CPAP at night, but has not been complaint wit this. Patient currently complains of a headache.     EMS vitals 99% SPO2 on room air 148/66 BP 60 P

## 2023-10-04 NOTE — ED Provider Notes (Signed)
 [place] EMERGENCY DEPARTMENT AT [facility] Provider Note   CSN: [id number] Arrival date & time: 10/04/23  1837     History  Chief Complaint  Patient presents with   Shortness of Breath   Headache    Gina Key is a 78 y.o. female.  78 year old female presents with increased dyspnea exertion times several weeks worse over the past week.  States that she has not had any cough or fever or chills.  No send when she ambulates she becomes more short of breath as well as some weakness.  Was seen by her family doctor recently and referred to pulmonologist which she has not seen yet.  Denies any orthopnea.  No chest pain or chest pressure.  Does use a CPAP at night.  Is here with her daughter who is at bedside and states that her weakness has become worse over the last week 4 hours       Home Medications Prior to Admission medications   Medication Sig Start Date End Date Taking? Authorizing Provider  aspirin 325 MG EC tablet Take 325 mg by mouth daily.    [provider]  atorvastatin (LIPITOR) 40 MG tablet TAKE 1 TABLET BY MOUTH DAILY. SCHEDULE ANNUAL VISIT FOR FUTURE REFILLS 09/28/23   Deeann Saint, MD  Incontinence Supply Disposable (WINGS HL ADULT BRIEFS/XL) MISC As needed daily. 09/27/23   Deeann Saint, MD  ipratropium (ATROVENT) 0.06 % nasal spray PLACE 2 SPRAYS INTO BOTH NOSTRILS 4 TIMES DAILY. 08/03/23   Deeann Saint, MD  levETIRAcetam (KEPPRA) 500 MG tablet Take 1 tablet (500 mg total) by mouth 2 (two) times daily. 10/17/22   Glean Salvo, NP  lidocaine (HM LIDOCAINE PATCH) 4 % Place 1 patch onto the skin daily.    [provider]  losartan (COZAAR) 100 MG tablet TAKE 1 TABLET BY MOUTH EVERY DAY 07/06/23   Deeann Saint, MD  metoprolol succinate (TOPROL-XL) 25 MG 24 hr tablet TAKE 1 TABLET (25 MG TOTAL) BY MOUTH DAILY. 07/06/23   Deeann Saint, MD  montelukast (SINGULAIR) 10 MG tablet TAKE 1 TABLET BY MOUTH EVERYDAY AT BEDTIME  07/06/23   Deeann Saint, MD  nitrofurantoin, macrocrystal-monohydrate, (MACROBID) 100 MG capsule Take 1 capsule (100 mg total) by mouth 2 (two) times daily. 03/28/23   Nelwyn Salisbury, MD  oxyCODONE (ROXICODONE) 5 MG immediate release tablet Take 1-1.5 tablets (5-7.5 mg total) by mouth every 12 (twelve) hours as needed. 07/27/23 07/26/24  Fanny Dance, MD  pantoprazole (PROTONIX) 20 MG tablet TAKE 1 TABLET BY MOUTH EVERY DAY 07/06/23   Deeann Saint, MD  potassium chloride (KLOR-CON) 10 MEQ tablet TAKE 1 TABLET BY MOUTH EVERY DAY 09/28/23   Nelwyn Salisbury, MD  spironolactone (ALDACTONE) 25 MG tablet TAKE 1 TABLET (25 MG TOTAL) BY MOUTH DAILY. 07/06/23   Deeann Saint, MD  topiramate (TOPAMAX) 100 MG tablet TAKE 1 TABLET BY MOUTH TWICE A DAY 07/06/23   Deeann Saint, MD      Allergies    Patient has no known allergies.    Review of Systems   Review of Systems  All other systems reviewed and are negative.   Physical Exam Updated Vital Signs BP (!) 143/66 (BP Location: Left Arm)   Pulse 63   Temp 97.9 F (36.6 C) (Oral)   Resp 20   SpO2 99%  Physical Exam Vitals and nursing note reviewed.  Constitutional:      General: She  is not in acute distress.    Appearance: Normal appearance. She is well-developed. She is not toxic-appearing.  HENT:     Head: Normocephalic and atraumatic.  Eyes:     General: Lids are normal.     Conjunctiva/sclera: Conjunctivae normal.     Pupils: Pupils are equal, round, and reactive to light.  Neck:     Thyroid: No thyroid mass.     Trachea: No tracheal deviation.  Cardiovascular:     Rate and Rhythm: Normal rate and regular rhythm.     Heart sounds: Normal heart sounds. No murmur heard.    No gallop.  Pulmonary:     Effort: Pulmonary effort is normal. No respiratory distress.     Breath sounds: Normal breath sounds. No stridor. No decreased breath sounds, wheezing, rhonchi or rales.  Abdominal:     General: There is no distension.      Palpations: Abdomen is soft.     Tenderness: There is no abdominal tenderness. There is no rebound.  Musculoskeletal:        General: No tenderness. Normal range of motion.     Cervical back: Normal range of motion and neck supple.  Skin:    General: Skin is warm and dry.     Findings: No abrasion or rash.  Neurological:     Mental Status: She is alert and oriented to person, place, and time. Mental status is at baseline.     GCS: GCS eye subscore is 4. GCS verbal subscore is 5. GCS motor subscore is 6.     Cranial Nerves: No cranial nerve deficit.     Sensory: No sensory deficit.     Motor: Motor function is intact.  Psychiatric:        Attention and Perception: Attention normal.        Speech: Speech normal.        Behavior: Behavior normal.     ED Results / Procedures / Treatments   Labs (all labs ordered are listed, but only abnormal results are displayed) Labs Reviewed  CBC WITH DIFFERENTIAL/PLATELET  BASIC METABOLIC PANEL  BRAIN NATRIURETIC PEPTIDE  I-STAT CHEM 8, ED    EKG EKG Interpretation Date/Time:  Thursday October 04 2023 21:45:53 EDT Ventricular Rate:  72 PR Interval:  188 QRS Duration:  109 QT Interval:  385 QTC Calculation: 422 R Axis:   -21  Text Interpretation: Sinus rhythm Borderline left axis deviation Low voltage, precordial leads Abnormal R-wave progression, early transition Consider anterior infarct Nonspecific T abnormalities, lateral leads No significant change since last tracing Confirmed by Lorre Nick ([id number]) on 10/04/2023 10:07:56 PM  Radiology DG Chest 2 View Result Date: 10/04/2023 CLINICAL DATA:  Shortness of breath EXAM: CHEST - 2 VIEW COMPARISON:  Chest radiograph 10/03/2023 FINDINGS: Normal cardiac and mediastinal contours. Aortic tortuosity. No large area pulmonary consolidation. No pleural effusion or pneumothorax. Thoracic spine degenerative changes. IMPRESSION: No active cardiopulmonary disease. Electronically Signed   By: Annia Belt M.D.   On: 10/04/2023 21:34   CT RENAL STONE STUDY Result Date: 10/04/2023 CLINICAL DATA:  Hematuria left-sided low back pain fatigue EXAM: CT ABDOMEN AND PELVIS WITHOUT CONTRAST TECHNIQUE: Multidetector CT imaging of the abdomen and pelvis was performed following the standard protocol without IV contrast. RADIATION DOSE REDUCTION: This exam was performed according to the departmental dose-optimization program which includes automated exposure control, adjustment of the mA and/or kV according to patient size and/or use of iterative reconstruction technique. COMPARISON:  None Available. FINDINGS: Lower chest:  No acute abnormality. Hepatobiliary: Simple hepatic cyst along the anterior margin of the right lobe of the liver segment 8 2.7 x 2.0 cm in diameter. No other focal hepatic lesions. Prior cholecystectomy without biliary dilatation. Pancreas: Unremarkable. No pancreatic ductal dilatation or surrounding inflammatory changes. Spleen: Normal in size without focal abnormality. Adrenals/Urinary Tract: Adrenal glands are unremarkable. Kidneys are normal, without renal calculi, focal lesion, or hydronephrosis. Bladder is unremarkable. Visualized portions of the ureters and bladder unremarkable. Stomach/Bowel: Stomach is within normal limits. Appendix appears normal. No evidence of bowel wall thickening, distention, or inflammatory changes. 8 mm calcification posterior to the cecal appendix could correlate with peritoneal calcification of no clinical significance. Vascular/Lymphatic: Aortic atherosclerosis. No enlarged abdominal or pelvic lymph nodes. Reproductive: Status post hysterectomy. No adnexal masses. Other: No abdominal wall hernia or abnormality. No abdominopelvic ascites. Multilevel degenerative disc disease lumbosacral spine with severe degenerative disc disease L4-5 and L5-S1 Musculoskeletal: IMPRESSION: *No acute abnormality of the abdomen or pelvis. *Simple hepatic cyst. *Prior cholecystectomy and  hysterectomy. *Aortic atherosclerosis. 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 Electronically Signed   By: Shaaron Adler M.D.   On: 10/04/2023 14:27    Procedures Procedures    Medications Ordered in ED Medications  albuterol (VENTOLIN HFA) 108 (90 Base) MCG/ACT inhaler 2 puff (has no administration in time range)    ED Course/ Medical Decision Making/ A&P                                 Medical Decision Making Amount and/or Complexity of Data Reviewed Labs: ordered. Radiology: ordered. ECG/medicine tests: ordered.  Risk Prescription drug management.   Patient is EKG shows sinus rhythm.  Chest x-ray per interpretation shows no acute findings.  Will obtain CT angio chest due to worsening increase in shortness of breath to evaluate for possible PE.  Labs are pending at this time.  Will sign out to next provider        Final Clinical Impression(s) / ED Diagnoses Final diagnoses:  None    Rx / DC Orders ED Discharge Orders     None         Lorre Nick, MD 10/04/23 2251

## 2023-10-04 NOTE — Telephone Encounter (Signed)
 Copied from CRM [id number]. Topic: Clinical - Red Word Triage >> Oct 04, 2023  4:50 PM Florestine Avers wrote: Red Word that prompted transfer to Nurse Triage: Patients daughter Kathie Rhodes called on behalf of the patient stating that she is having shortness of breath.   Chief Complaint: Shortness of breath  Frequency: Intermittent  Pertinent Negatives: Patient denies fever, cough, runny nose, dizziness Disposition: [] ED /[] Urgent Care (no appt availability in office) / [] Appointment(In office/virtual)/ []  [facility]/ [] Home Care/ [] Refused Recommended Disposition /[] [facility]/ [x]  Follow-up with PCP Additional Notes: Patient's daughter called with the patient reporting that the patient is experiencing intermittent shortness of breath. They report that they were seen in the office yesterday and were given a referral to a pulmonologist. The patient reports that her shortness of breath was bad enough earlier that she had to lay down, but that she is not experiencing any difficulty  breathing now.   I advised that I would send a message to the office to determine if they need to be seen back in the office or what they next steps they want to take may be. I advised them that if the shortness of breath returns today to go to urgent care to the ED. They understood and are agreeable with this plan.     Reason for Disposition  [1] MILD longstanding difficulty breathing AND [2]  SAME as normal  Answer Assessment - Initial Assessment Questions 1. RESPIRATORY STATUS: "Describe your breathing?" (e.g., wheezing, shortness of breath, unable to speak, severe coughing)      Shortness of breath  2. ONSET: "When did this breathing problem begin?"      Yesterday   3. PATTERN "Does the difficult breathing come and go, or has it been constant since it started?"      Intermittent  4. SEVERITY: "How bad is your breathing?" (e.g., mild, moderate, severe)    - MILD: No SOB at rest, mild SOB with walking,  speaks normally in sentences, can lie down, no retractions, pulse < 100.    - MODERATE: SOB at rest, SOB with minimal exertion and prefers to sit, cannot lie down flat, speaks in phrases, mild retractions, audible wheezing, pulse 100-120.    - SEVERE: Very SOB at rest, speaks in single words, struggling to breathe, sitting hunched forward, retractions, pulse > 120      Moderate to severe  5. RECURRENT SYMPTOM: "Have you had difficulty breathing before?" If Yes, ask: "When was the last time?" and "What happened that time?"      Yes 6. CARDIAC HISTORY: "Do you have any history of heart disease?" (e.g., heart attack, angina, bypass surgery, angioplasty)      No 7. LUNG HISTORY: "Do you have any history of lung disease?"  (e.g., pulmonary embolus, asthma, emphysema)     Sleep apnea  8. CAUSE: "What do you think is causing the breathing problem?"      Unsure  9. OTHER SYMPTOMS: "Do you have any other symptoms? (e.g., dizziness, runny nose, cough, chest pain, fever)     No 10. O2 SATURATION MONITOR:  "Do you use an oxygen saturation monitor (pulse oximeter) at home?" If Yes, ask: "What is your reading (oxygen level) today?" "What is your usual oxygen saturation reading?" (e.g., 95%)       Unable to check  Protocols used: Breathing Difficulty-A-AH

## 2023-10-05 ENCOUNTER — Inpatient Hospital Stay (HOSPITAL_COMMUNITY)

## 2023-10-05 ENCOUNTER — Other Ambulatory Visit (HOSPITAL_COMMUNITY): Payer: Self-pay

## 2023-10-05 ENCOUNTER — Telehealth (HOSPITAL_COMMUNITY): Payer: Self-pay | Admitting: Pharmacy Technician

## 2023-10-05 ENCOUNTER — Encounter (HOSPITAL_COMMUNITY): Payer: Self-pay | Admitting: Internal Medicine

## 2023-10-05 ENCOUNTER — Other Ambulatory Visit (HOSPITAL_COMMUNITY)

## 2023-10-05 DIAGNOSIS — Z823 Family history of stroke: Secondary | ICD-10-CM | POA: Diagnosis not present

## 2023-10-05 DIAGNOSIS — R0609 Other forms of dyspnea: Secondary | ICD-10-CM | POA: Diagnosis not present

## 2023-10-05 DIAGNOSIS — Z8051 Family history of malignant neoplasm of kidney: Secondary | ICD-10-CM | POA: Diagnosis not present

## 2023-10-05 DIAGNOSIS — R0600 Dyspnea, unspecified: Secondary | ICD-10-CM | POA: Diagnosis not present

## 2023-10-05 DIAGNOSIS — Z803 Family history of malignant neoplasm of breast: Secondary | ICD-10-CM | POA: Diagnosis not present

## 2023-10-05 DIAGNOSIS — E7849 Other hyperlipidemia: Secondary | ICD-10-CM | POA: Diagnosis not present

## 2023-10-05 DIAGNOSIS — E871 Hypo-osmolality and hyponatremia: Secondary | ICD-10-CM | POA: Diagnosis not present

## 2023-10-05 DIAGNOSIS — G4733 Obstructive sleep apnea (adult) (pediatric): Secondary | ICD-10-CM

## 2023-10-05 DIAGNOSIS — K769 Liver disease, unspecified: Secondary | ICD-10-CM | POA: Diagnosis not present

## 2023-10-05 DIAGNOSIS — K219 Gastro-esophageal reflux disease without esophagitis: Secondary | ICD-10-CM | POA: Diagnosis not present

## 2023-10-05 DIAGNOSIS — I2601 Septic pulmonary embolism with acute cor pulmonale: Secondary | ICD-10-CM | POA: Diagnosis not present

## 2023-10-05 DIAGNOSIS — R519 Headache, unspecified: Secondary | ICD-10-CM | POA: Diagnosis not present

## 2023-10-05 DIAGNOSIS — Z7982 Long term (current) use of aspirin: Secondary | ICD-10-CM | POA: Diagnosis not present

## 2023-10-05 DIAGNOSIS — E669 Obesity, unspecified: Secondary | ICD-10-CM | POA: Diagnosis not present

## 2023-10-05 DIAGNOSIS — E870 Hyperosmolality and hypernatremia: Secondary | ICD-10-CM

## 2023-10-05 DIAGNOSIS — M7989 Other specified soft tissue disorders: Secondary | ICD-10-CM | POA: Diagnosis not present

## 2023-10-05 DIAGNOSIS — Z87898 Personal history of other specified conditions: Secondary | ICD-10-CM | POA: Diagnosis not present

## 2023-10-05 DIAGNOSIS — Z800 Family history of malignant neoplasm of digestive organs: Secondary | ICD-10-CM | POA: Diagnosis not present

## 2023-10-05 DIAGNOSIS — F419 Anxiety disorder, unspecified: Secondary | ICD-10-CM | POA: Diagnosis not present

## 2023-10-05 DIAGNOSIS — I2609 Other pulmonary embolism with acute cor pulmonale: Secondary | ICD-10-CM | POA: Diagnosis not present

## 2023-10-05 DIAGNOSIS — I2699 Other pulmonary embolism without acute cor pulmonale: Secondary | ICD-10-CM

## 2023-10-05 DIAGNOSIS — I700 Atherosclerosis of aorta: Secondary | ICD-10-CM | POA: Diagnosis not present

## 2023-10-05 DIAGNOSIS — Z83719 Family history of colon polyps, unspecified: Secondary | ICD-10-CM | POA: Diagnosis not present

## 2023-10-05 DIAGNOSIS — Z6837 Body mass index (BMI) 37.0-37.9, adult: Secondary | ICD-10-CM | POA: Diagnosis not present

## 2023-10-05 DIAGNOSIS — G40909 Epilepsy, unspecified, not intractable, without status epilepticus: Secondary | ICD-10-CM | POA: Diagnosis not present

## 2023-10-05 DIAGNOSIS — I10 Essential (primary) hypertension: Secondary | ICD-10-CM

## 2023-10-05 DIAGNOSIS — Z79899 Other long term (current) drug therapy: Secondary | ICD-10-CM | POA: Diagnosis not present

## 2023-10-05 DIAGNOSIS — Z808 Family history of malignant neoplasm of other organs or systems: Secondary | ICD-10-CM | POA: Diagnosis not present

## 2023-10-05 DIAGNOSIS — I82432 Acute embolism and thrombosis of left popliteal vein: Secondary | ICD-10-CM | POA: Diagnosis not present

## 2023-10-05 DIAGNOSIS — Z9049 Acquired absence of other specified parts of digestive tract: Secondary | ICD-10-CM | POA: Diagnosis not present

## 2023-10-05 DIAGNOSIS — Z91199 Patient's noncompliance with other medical treatment and regimen due to unspecified reason: Secondary | ICD-10-CM | POA: Diagnosis not present

## 2023-10-05 DIAGNOSIS — E7800 Pure hypercholesterolemia, unspecified: Secondary | ICD-10-CM | POA: Diagnosis not present

## 2023-10-05 LAB — COMPREHENSIVE METABOLIC PANEL
ALT: 19 U/L (ref 0–44)
AST: 19 U/L (ref 15–41)
Albumin: 3.7 g/dL (ref 3.5–5.0)
Alkaline Phosphatase: 103 U/L (ref 38–126)
Anion gap: 7 (ref 5–15)
BUN: 15 mg/dL (ref 8–23)
CO2: 21 mmol/L — ABNORMAL LOW (ref 22–32)
Calcium: 8.8 mg/dL — ABNORMAL LOW (ref 8.9–10.3)
Chloride: 112 mmol/L — ABNORMAL HIGH (ref 98–111)
Creatinine, Ser: 0.71 mg/dL (ref 0.44–1.00)
GFR, Estimated: >60 mL/min (ref >60)
Glucose, Bld: 111 mg/dL — ABNORMAL HIGH (ref 70–99)
Potassium: 3.3 mmol/L — ABNORMAL LOW (ref 3.5–5.1)
Sodium: 140 mmol/L (ref 135–145)
Total Bilirubin: 0.4 mg/dL (ref 0.0–1.2)
Total Protein: 6.4 g/dL — ABNORMAL LOW (ref 6.5–8.1)

## 2023-10-05 LAB — CBC
HCT: 35.8 % — ABNORMAL LOW (ref 36.0–46.0)
HCT: 35.5 % — ABNORMAL LOW (ref 36.0–46.0)
Hemoglobin: 10.7 g/dL — ABNORMAL LOW (ref 12.0–15.0)
Hemoglobin: 10.6 g/dL — ABNORMAL LOW (ref 12.0–15.0)
MCH: 26.6 pg (ref 26.0–34.0)
MCH: 26.8 pg (ref 26.0–34.0)
MCHC: 29.9 g/dL — ABNORMAL LOW (ref 30.0–36.0)
MCHC: 29.9 g/dL — ABNORMAL LOW (ref 30.0–36.0)
MCV: 89.1 fL (ref 80.0–100.0)
MCV: 89.9 fL (ref 80.0–100.0)
Platelets: 160 10*3/uL (ref 150–400)
Platelets: 155 10*3/uL (ref 150–400)
RBC: 4.02 MIL/uL (ref 3.87–5.11)
RBC: 3.95 MIL/uL (ref 3.87–5.11)
RDW: 13.2 % (ref 11.5–15.5)
RDW: 13.2 % (ref 11.5–15.5)
WBC: 8.9 10*3/uL (ref 4.0–10.5)
WBC: 7.8 10*3/uL (ref 4.0–10.5)
nRBC: 0 % (ref 0.0–0.2)
nRBC: 0 % (ref 0.0–0.2)

## 2023-10-05 LAB — URINE CULTURE
MICRO NUMBER:: 16192214
Result:: NO GROWTH
SPECIMEN QUALITY:: ADEQUATE

## 2023-10-05 LAB — HEPARIN LEVEL (UNFRACTIONATED)
Heparin Unfractionated: >1.1 [IU]/mL — ABNORMAL HIGH (ref 0.30–0.70)
Heparin Unfractionated: 0.7 [IU]/mL (ref 0.30–0.70)

## 2023-10-05 LAB — PROTIME-INR
INR: 1.1 (ref 0.8–1.2)
Prothrombin Time: 14.2 s (ref 11.4–15.2)

## 2023-10-05 LAB — SODIUM
Sodium: 143 mmol/L (ref 135–145)
Sodium: 138 mmol/L (ref 135–145)
Sodium: 142 mmol/L (ref 135–145)

## 2023-10-05 LAB — IRON,TIBC AND FERRITIN PANEL

## 2023-10-05 LAB — TROPONIN I (HIGH SENSITIVITY)
Troponin I (High Sensitivity): 8 ng/L (ref <18)
Troponin I (High Sensitivity): 8 ng/L (ref <18)

## 2023-10-05 LAB — APTT: aPTT: 142 s — ABNORMAL HIGH (ref 24–36)

## 2023-10-05 LAB — BRAIN NATRIURETIC PEPTIDE: B Natriuretic Peptide: 48.1 pg/mL (ref 0.0–100.0)

## 2023-10-05 LAB — MRSA NEXT GEN BY PCR, NASAL: MRSA by PCR Next Gen: NOT DETECTED

## 2023-10-05 LAB — ANTITHROMBIN III: AntiThromb III Func: 101 % (ref 75–120)

## 2023-10-05 MED ORDER — TOPIRAMATE 100 MG PO TABS
100.0000 mg | ORAL_TABLET | Freq: Two times a day (BID) | ORAL | Status: DC
  Administered 2023-10-05 – 2023-10-06 (×4): 100 mg via ORAL
  Filled 2023-10-05 (×4): qty 1

## 2023-10-05 MED ORDER — METOPROLOL SUCCINATE ER 25 MG PO TB24
25.0000 mg | ORAL_TABLET | Freq: Every day | ORAL | Status: DC
Start: 2023-10-05 — End: 2023-10-06
  Administered 2023-10-05 – 2023-10-06 (×2): 25 mg via ORAL
  Filled 2023-10-05 (×2): qty 1

## 2023-10-05 MED ORDER — SODIUM CHLORIDE 0.9 % IV SOLN: 250.0000 mL | INTRAVENOUS | Status: AC | PRN

## 2023-10-05 MED ORDER — ONDANSETRON HCL 4 MG/2ML IJ SOLN: 4.0000 mg | Freq: Four times a day (QID) | INTRAMUSCULAR | Status: DC | PRN

## 2023-10-05 MED ORDER — ACETAMINOPHEN 650 MG RE SUPP: 650.0000 mg | Freq: Four times a day (QID) | RECTAL | Status: DC | PRN

## 2023-10-05 MED ORDER — HEPARIN (PORCINE) 25000 UT/250ML-% IV SOLN
1200.0000 [IU]/h | INTRAVENOUS | Status: DC
  Administered 2023-10-05: 1200 [IU]/h via INTRAVENOUS
  Filled 2023-10-05: qty 250

## 2023-10-05 MED ORDER — PANTOPRAZOLE SODIUM 20 MG PO TBEC
20.0000 mg | DELAYED_RELEASE_TABLET | Freq: Every day | ORAL | Status: DC
  Administered 2023-10-05 – 2023-10-06 (×2): 20 mg via ORAL
  Filled 2023-10-05 (×2): qty 1

## 2023-10-05 MED ORDER — ALBUTEROL SULFATE (2.5 MG/3ML) 0.083% IN NEBU: 2.5000 mg | INHALATION_SOLUTION | RESPIRATORY_TRACT | Status: DC | PRN

## 2023-10-05 MED ORDER — ATORVASTATIN CALCIUM 40 MG PO TABS
40.0000 mg | ORAL_TABLET | Freq: Every day | ORAL | Status: DC
  Administered 2023-10-05 – 2023-10-06 (×2): 40 mg via ORAL
  Filled 2023-10-05 (×2): qty 1

## 2023-10-05 MED ORDER — SODIUM CHLORIDE 0.9% FLUSH
3.0000 mL | Freq: Two times a day (BID) | INTRAVENOUS | Status: DC
  Administered 2023-10-05 – 2023-10-06 (×4): 3 mL via INTRAVENOUS

## 2023-10-05 MED ORDER — LEVETIRACETAM 500 MG PO TABS
500.0000 mg | ORAL_TABLET | Freq: Two times a day (BID) | ORAL | Status: DC
  Administered 2023-10-05 – 2023-10-06 (×4): 500 mg via ORAL
  Filled 2023-10-05 (×4): qty 1

## 2023-10-05 MED ORDER — LOPERAMIDE HCL 2 MG PO CAPS
2.0000 mg | ORAL_CAPSULE | Freq: Four times a day (QID) | ORAL | Status: DC | PRN
  Administered 2023-10-05 (×2): 2 mg via ORAL
  Filled 2023-10-05 (×2): qty 1

## 2023-10-05 MED ORDER — SODIUM CHLORIDE 0.9% FLUSH: 3.0000 mL | INTRAVENOUS | Status: DC | PRN

## 2023-10-05 MED ORDER — DEXTROSE 5 % IV SOLN: INTRAVENOUS | Status: DC

## 2023-10-05 MED ORDER — DOCUSATE SODIUM 100 MG PO CAPS
100.0000 mg | ORAL_CAPSULE | Freq: Two times a day (BID) | ORAL | Status: DC
  Administered 2023-10-05: 100 mg via ORAL
  Filled 2023-10-05: qty 1

## 2023-10-05 MED ORDER — HEPARIN (PORCINE) 25000 UT/250ML-% IV SOLN
800.0000 [IU]/h | INTRAVENOUS | Status: DC
  Administered 2023-10-05: 900 [IU]/h via INTRAVENOUS
  Filled 2023-10-05: qty 250

## 2023-10-05 MED ORDER — POTASSIUM CHLORIDE CRYS ER 20 MEQ PO TBCR
40.0000 meq | EXTENDED_RELEASE_TABLET | Freq: Once | ORAL | Status: AC
  Administered 2023-10-05: 40 meq via ORAL
  Filled 2023-10-05: qty 2

## 2023-10-05 MED ORDER — ORAL CARE MOUTH RINSE: 15.0000 mL | OROMUCOSAL | Status: DC | PRN

## 2023-10-05 MED ORDER — HEPARIN BOLUS VIA INFUSION
4000.0000 [IU] | Freq: Once | INTRAVENOUS | Status: AC
  Administered 2023-10-05: 4000 [IU] via INTRAVENOUS
  Filled 2023-10-05: qty 4000

## 2023-10-05 MED ORDER — ACETAMINOPHEN 325 MG PO TABS
650.0000 mg | ORAL_TABLET | Freq: Four times a day (QID) | ORAL | Status: DC | PRN
  Administered 2023-10-05: 650 mg via ORAL
  Filled 2023-10-05: qty 2

## 2023-10-05 MED ORDER — CHLORHEXIDINE GLUCONATE CLOTH 2 % EX PADS
6.0000 | MEDICATED_PAD | Freq: Every day | CUTANEOUS | Status: DC
  Administered 2023-10-05: 6 via TOPICAL

## 2023-10-05 MED ORDER — ONDANSETRON HCL 4 MG PO TABS
4.0000 mg | ORAL_TABLET | Freq: Four times a day (QID) | ORAL | Status: DC | PRN
Start: 2023-10-05 — End: 2023-10-06

## 2023-10-05 NOTE — Progress Notes (Addendum)
 PHARMACY - ANTICOAGULATION CONSULT NOTE  Pharmacy Consult for heparin Indication: pulmonary embolus with evidence of RHS  No Known Allergies  Patient Measurements: Height: 5\' 2"  (157.5 cm) Weight: 92.2 kg (203 lb 4.2 oz) IBW/kg (Calculated) : 50.1 Heparin Dosing Weight: 71 kg  Vital Signs: Temp: 97.7 F (36.5 C) (03/14 1936) Temp Source: Oral (03/14 1936) BP: 148/52 (03/14 2000) Pulse Rate: 66 (03/14 2059)  Labs: Recent Labs    10/04/23 2259 10/04/23 2307 10/05/23 0213 10/05/23 0256 10/05/23 0422 10/05/23 0544 10/05/23 1009 10/05/23 2106  HGB 11.7* 12.2  --   --   --  10.7* 10.6*  --   HCT 39.4 36.0  --   --   --  35.8* 35.5*  --   PLT 163  --   --   --   --  160 155  --   APTT  --   --   --   --  142*  --   --   --   LABPROT  --   --   --   --  14.2  --   --   --   INR  --   --   --   --  1.1  --   --   --   HEPARINUNFRC  --   --   --   --   --   --  >1.10* 0.70  CREATININE 0.83 0.90  --   --   --  0.71  --   --   TROPONINIHS  --   --  8 8  --   --   --   --     Estimated Creatinine Clearance: 63.2 mL/min (by C-G formula based on SCr of 0.71 mg/dL).   Medications:  No oral anticoagulation PTA  Assessment: 52 yoF presents w/ SOB; found to have acute PE with moderate clot burden. CTA does show RH strain. Pharmacy consulted to dose IV heparin.  Today, 10/05/2023: Heparin level = 0.7 (therapeutic) after heparin gtt rate decreased to 900 units/hr No reported bleeding or other infusion issues reported per RN  Goal of Therapy:  Heparin level 0.3-0.7 units/ml Monitor platelets by anticoagulation protocol: Yes   Plan:  Continue heparin at 900 units/hr Recheck level with AM labs to confirm therapeutic dose Daily heparin level & CBC F/u plans for long-term anticoag  Terrilee Files, PharmD 10/05/2023, 10:53 PM  ADDENDUM: Heparin gtt infusing @ 900 units/hr AM Heparin level = 0.77 (slightly supratherapeutic) Hgb = 10.7 (stable), PLTC wnl No complications of  therapy noted  Plan: Decrease heparin gtt to 800 units/hr Check heparin level in 8 hr  Terrilee Files, PharmD

## 2023-10-05 NOTE — Progress Notes (Signed)
 Bilateral lower extremity venous duplex has been completed. Preliminary results can be found in CV Proc through chart review.  Results were given to Dr. Renford Dills.  10/05/23 8:48 AM Olen Cordial RVT

## 2023-10-05 NOTE — Progress Notes (Signed)
 PHARMACY - ANTICOAGULATION CONSULT NOTE  Pharmacy Consult for heparin Indication: pulmonary embolus with evidence of RHS  No Known Allergies  Patient Measurements:   Heparin Dosing Weight: 71 kg  Vital Signs: Temp: 98 F (36.7 C) (03/14 0959) Temp Source: Oral (03/14 0959) BP: 133/66 (03/14 0958) Pulse Rate: 67 (03/14 0958)  Labs: Recent Labs    10/04/23 2259 10/04/23 2307 10/05/23 0213 10/05/23 0256 10/05/23 0422 10/05/23 0544 10/05/23 1009  HGB 11.7* 12.2  --   --   --  10.7* 10.6*  HCT 39.4 36.0  --   --   --  35.8* 35.5*  PLT 163  --   --   --   --  160 155  APTT  --   --   --   --  142*  --   --   LABPROT  --   --   --   --  14.2  --   --   INR  --   --   --   --  1.1  --   --   HEPARINUNFRC  --   --   --   --   --   --  >1.10*  CREATININE 0.83 0.90  --   --   --  0.71  --   TROPONINIHS  --   --  8 8  --   --   --     Estimated Creatinine Clearance: 63.3 mL/min (by C-G formula based on SCr of 0.71 mg/dL).   Medications:  No oral anticoagulation PTA  Assessment: 89 yoF presents w/ SOB; found to have acute PE with moderate clot burden. CTA does show RH strain. Pharmacy consulted to dose IV heparin.  Today, 10/05/2023: First heparin level grossly elevated on 1200 units/hr Per RN, heparin running in L forearm, level drawn from R arm; pump rate confirmed at 12 ml/hr aPTT also elevated, but note this was drawn ~1 hr after 4000 unit bolus CBC: Hgb slightly low but stable; Plt stable WNL SCr stable WNL No reported bleeding or other infusion issues per RN  Goal of Therapy:  Heparin level 0.3-0.7 units/ml Monitor platelets by anticoagulation protocol: Yes   Plan:  Hold heparin for ~1 hr Resume heparin at 900 units/hr Recheck level 8 hr after infusion resumed Daily heparin level & CBC F/u plans for long-term anticoag  Bernadene Person, PharmD, BCPS [phone] 10/05/2023, 11:35 AM

## 2023-10-05 NOTE — Sepsis Progress Note (Signed)
 Following for sepsis monitoring ?

## 2023-10-05 NOTE — Progress Notes (Signed)
 Attempted bedside echo twice today, but patient has been busy at both times with other staff.

## 2023-10-05 NOTE — Telephone Encounter (Signed)
 Patient Product/process development scientist completed.    The patient is insured through [employer]. Patient has Medicare and is not eligible for a copay card, but may be able to apply for patient assistance or Medicare RX Payment Plan (Patient Must reach out to their plan, if eligible for payment plan), if available.    Ran test claim for Eliquis 5 mg and the current 30 day co-pay is $0.00.  Ran test claim for Xarelto 20 mg and the current 30 day co-pay is $0.00.  This test claim was processed through [facility]- copay amounts may vary at other pharmacies due to pharmacy/plan contracts, or as the patient moves through the different stages of their insurance plan.     Roland Earl, CPHT Pharmacy Technician III Certified Patient Advocate [facility] Pharm[facility] Patient Advocate Team Direct Number: [phone]  Fax: [phone]

## 2023-10-05 NOTE — Progress Notes (Signed)
   10/05/23 2059  BiPAP/CPAP/SIPAP  Reason BIPAP/CPAP not in use Other(comment);Non-compliant (Pt stated  she used her machine at home more than 5 months ago cannot tolerate it and refused for tonight. Told pt to let RN know if she changes her mind RT is here all night.)  BiPAP/CPAP /SiPAP Vitals  Pulse Rate 66  Resp 17  SpO2 99 %  MEWS Score/Color  MEWS Score 0  MEWS Score Color Green

## 2023-10-05 NOTE — Plan of Care (Signed)

## 2023-10-05 NOTE — Progress Notes (Signed)
 Brief same day note:   Patient is a 78 year old female with history of memory impairment, hypertension, OSA/obesity, seizure, who presented with exertional dyspnea for several weeks, which was progressive.  On presentation, she was hemodynamically stable.  Lab work sodium 149.  CTA chest showed acute PE consistent with submassive size, right ventricular strain.  Started on heparin drip.  Echo pending. Patient seen and examined at bedside today.  She was comfortable.  On room air.  Denies any shortness of breath or cough or chest pain.  Assessment and plan:  Acute PE/dyspnea: CT angiogram showed acute bilateral PE consistent with submassive size, right heart strain.  Currently on heparin drip.  Continue same for now until echo is obtained.  Patient is currently hemodynamically stable.  On room air.  Chest x-ray did not show any acute findings. Case discussed with PCCM, Dr. Sherryll Burger who recommended continued treatment with IV heparin, no need for thrombolytics.  Sent hypercoagulable panel.  Lower extremity Doppler showed LLE dvt.  Monitor on telemetry  Hyponatremia: Resolved  Hypertension: Continue to hold home antihypertensives in the setting of right ventricular strain.  Monitor blood pressure  History of seizure disorder: On Keppra, Topamax  GERD: Continue Protonix  History of OSA: On CPAP  History of hyperlipidemia: Lipitor  I called and discussed with her daughter Kathie Rhodes on phone on 3/14 about her management plan

## 2023-10-05 NOTE — H&P (Signed)
 History and Physical    Gina Key EAV:[id number] DOB: [birth year] DOA: 10/04/2023  PCP: Deeann Saint, MD   Patient coming from: Home   Chief Complaint:  Chief Complaint  Patient presents with   Shortness of Breath   Headache   ED TRIAGE note:Patient presents due to shortness of breath upon waking and with exertion. She has been evaluated by her PCP yesterday for this and was referred to a pulmonologist. Family believes symptoms have worsened. Patient wears a CPAP at night, but has not been complaint wit this. Patient currently complains of a headache.   EMS vitals 99% SPO2 on room air 148/66 BP 60 P  HPI:  Gina Key is a 78 y.o. female with medical history significant of memory impairment, chronic constipation, urinary incontinence, essential hypertension, OSA, seizure and obesity hypoventilation syndrome presented to emergency department complaining of exertional dyspnea for several weeks which has been getting worse for last week.  Patient reported that with ambulation her shortness of breath getting worse as well as he noticed some weakness.  She has been seen primary care physician today who has referred to pulmonologist for evaluation.  Patient denies any fever, chill and cough.  Denies any chest pain and chest pressure.  Patient's daughter at the bedside who is reporting that patient also complaining about generalized weakness.  Patient denies any recent long distance driving and flight/travel.  No personal history of cancer however history of pancreatic cancer runs in the family.   ED Course:  At presentation to ED patient is hemodynamically stable. Normal BNP 48. CBC unremarkable stable H&H. BMP unremarkable except elevated chloride 115. Repeat I Chem-8 showing elevated sodium level 149. EKG showing normal sinus rhythm heart rate 72, borderline left axis deviation.  Low voltage precordial leads.  CTA chest showing acute pulmonary embolism consistent with  submassive PE and right ventricular strain.  Chest x-ray no active cardiopulmonary process.  In the ED heparin drip has been started.  Hospitalist has been consulted for further evaluation and management of acute development of PE.   Significant labs in the ED: Lab Orders         CBC with Differential/Platelet         Basic metabolic panel         Brain natriuretic peptide         Heparin level (unfractionated)         CBC         Antithrombin III         APTT         Protime-INR         Sodium         CBC         Comprehensive metabolic panel         Beta-2-glycoprotein i abs, IgG/M/A         Cardiolipin antibodies, IgG, IgM, IgA         Factor 5 leiden         Homocysteine, serum         Lupus anticoagulant panel         Protein C activity         Protein C, total         Protein S activity         Protein S, total         Prothrombin gene mutation         I-stat chem 8, ed  Review of Systems:  Review of Systems  Constitutional:  Positive for malaise/fatigue. Negative for chills, fever and weight loss.  Respiratory:  Positive for shortness of breath. Negative for cough, sputum production and wheezing.   Cardiovascular:  Positive for orthopnea. Negative for chest pain, palpitations, claudication and leg swelling.  Gastrointestinal:  Negative for abdominal pain, blood in stool, constipation, diarrhea, heartburn, nausea and vomiting.  Genitourinary:  Negative for dysuria, flank pain, frequency, hematuria and urgency.  Musculoskeletal:  Negative for back pain, falls, joint pain, myalgias and neck pain.  Neurological:  Negative for dizziness and headaches.  Psychiatric/Behavioral:  The patient is not nervous/anxious.   All other systems reviewed and are negative.   Past Medical History:  Diagnosis Date   Acid reflux    Allergy    Anxiety    DDD (degenerative disc disease), lumbar    Depression    Facet joint disease    GERD (gastroesophageal reflux disease)     High cholesterol    Hypertension    Low vitamin D level    OSA (obstructive sleep apnea)    Primary osteoarthritis involving multiple joints    Seizures (HCC)    Sleep apnea    C PAP    Past Surgical History:  Procedure Laterality Date   BREAST BIOPSY Right    benign   CHOLECYSTECTOMY     left and right rotator cuff       reports that she has never smoked. She has never used smokeless tobacco. She reports that she does not drink alcohol and does not use drugs.  No Known Allergies  Family History  Problem Relation Age of Onset   Pancreatic cancer Mother    Thyroid cancer Sister    Colon polyps Daughter    Kidney cancer Daughter    Sleep apnea Daughter    Breast cancer Maternal Aunt 45   Seizures Paternal Grandmother    Cancer Brother        tongue   Sleep apnea Son    Stroke Son    Seizures Son    Sleep apnea Son    Seizures Grandson    Colon cancer Neg Hx    Crohn's disease Neg Hx    Esophageal cancer Neg Hx    Rectal cancer Neg Hx    Stomach cancer Neg Hx    Ulcerative colitis Neg Hx    Dementia Neg Hx     Prior to Admission medications   Medication Sig Start Date End Date Taking? Authorizing Provider  aspirin 325 MG EC tablet Take 325 mg by mouth daily.    [provider]  atorvastatin (LIPITOR) 40 MG tablet TAKE 1 TABLET BY MOUTH DAILY. SCHEDULE ANNUAL VISIT FOR FUTURE REFILLS 09/28/23   Deeann Saint, MD  Incontinence Supply Disposable (WINGS HL ADULT BRIEFS/XL) MISC As needed daily. 09/27/23   Deeann Saint, MD  ipratropium (ATROVENT) 0.06 % nasal spray PLACE 2 SPRAYS INTO BOTH NOSTRILS 4 TIMES DAILY. 08/03/23   Deeann Saint, MD  levETIRAcetam (KEPPRA) 500 MG tablet Take 1 tablet (500 mg total) by mouth 2 (two) times daily. 10/17/22   Glean Salvo, NP  lidocaine (HM LIDOCAINE PATCH) 4 % Place 1 patch onto the skin daily.    [provider]  losartan (COZAAR) 100 MG tablet TAKE 1 TABLET BY MOUTH EVERY DAY 07/06/23   Deeann Saint, MD  metoprolol succinate (TOPROL-XL) 25 MG 24 hr tablet TAKE 1 TABLET (25 MG TOTAL) BY  MOUTH DAILY. 07/06/23   Deeann Saint, MD  montelukast (SINGULAIR) 10 MG tablet TAKE 1 TABLET BY MOUTH EVERYDAY AT BEDTIME 07/06/23   Deeann Saint, MD  nitrofurantoin, macrocrystal-monohydrate, (MACROBID) 100 MG capsule Take 1 capsule (100 mg total) by mouth 2 (two) times daily. 03/28/23   Nelwyn Salisbury, MD  oxyCODONE (ROXICODONE) 5 MG immediate release tablet Take 1-1.5 tablets (5-7.5 mg total) by mouth every 12 (twelve) hours as needed. 07/27/23 07/26/24  Fanny Dance, MD  pantoprazole (PROTONIX) 20 MG tablet TAKE 1 TABLET BY MOUTH EVERY DAY 07/06/23   Deeann Saint, MD  potassium chloride (KLOR-CON) 10 MEQ tablet TAKE 1 TABLET BY MOUTH EVERY DAY 09/28/23   Nelwyn Salisbury, MD  spironolactone (ALDACTONE) 25 MG tablet TAKE 1 TABLET (25 MG TOTAL) BY MOUTH DAILY. 07/06/23   Deeann Saint, MD  topiramate (TOPAMAX) 100 MG tablet TAKE 1 TABLET BY MOUTH TWICE A DAY 07/06/23   Deeann Saint, MD     Physical Exam: Vitals:   10/04/23 1844 10/04/23 2056 10/04/23 2345 10/05/23 0100  BP: (!) 155/79 (!) 143/66 (!) 146/65 131/66  Pulse: 75 63 62 60  Resp: 18 20 20 18   Temp: 97.9 F (36.6 C)   97.8 F (36.6 C)  TempSrc: Oral   Oral  SpO2: 100% 99% 95% 96%    Physical Exam Vitals and nursing note reviewed.  Constitutional:      Appearance: She is not ill-appearing.  Cardiovascular:     Rate and Rhythm: Regular rhythm. Bradycardia present.     Pulses: Normal pulses.     Heart sounds: Normal heart sounds. No murmur heard.    No friction rub.  Pulmonary:     Effort: Pulmonary effort is normal.     Breath sounds: Normal breath sounds. No decreased breath sounds, wheezing, rhonchi or rales.  Chest:     Chest wall: No tenderness.  Musculoskeletal:     Cervical back: Normal range of motion and neck supple.  Skin:    General: Skin is dry.     Capillary Refill: Capillary refill takes less than 2  seconds.  Neurological:     Mental Status: She is alert and oriented to person, place, and time.  Psychiatric:        Mood and Affect: Mood normal. Mood is not anxious.      Labs on Admission: I have personally reviewed following labs and imaging studies  CBC: Recent Labs  Lab 10/03/23 1625 10/04/23 2259 10/04/23 2307  WBC 8.3 9.2  --   NEUTROABS 4.7 5.1  --   HGB 12.5 11.7* 12.2  HCT 39.8 39.4 36.0  MCV 85.3 90.0  --   PLT 180.0 163  --    Basic Metabolic Panel: Recent Labs  Lab 10/03/23 1625 10/04/23 2259 10/04/23 2307  NA 144 145 149*  K 3.9 3.9 3.9  CL 112 115* 114*  CO2 25 25  --   GLUCOSE 73 105* 101*  BUN 14 16 15   CREATININE 0.91 0.83 0.90  CALCIUM 10.1 9.3  --    GFR: Estimated Creatinine Clearance: 56.2 mL/min (by C-G formula based on SCr of 0.9 mg/dL). Liver Function Tests: Recent Labs  Lab 10/03/23 1625  AST 23  ALT 20  ALKPHOS 126*  BILITOT 0.4  PROT 7.4  ALBUMIN 4.5   No results for input(s): "LIPASE", "AMYLASE" in the last 168 hours. No results for input(s): "AMMONIA" in the last 168 hours. Coagulation Profile: No results for  input(s): "INR", "PROTIME" in the last 168 hours. Cardiac Enzymes: No results for input(s): "CKTOTAL", "CKMB", "CKMBINDEX", "TROPONINI", "TROPONINIHS" in the last 168 hours. BNP (last 3 results) Recent Labs    03/16/23 1640 10/04/23 2259  BNP 31.2 48.1   HbA1C: No results for input(s): "HGBA1C" in the last 72 hours. CBG: No results for input(s): "GLUCAP" in the last 168 hours. Lipid Profile: No results for input(s): "CHOL", "HDL", "LDLCALC", "TRIG", "CHOLHDL", "LDLDIRECT" in the last 72 hours. Thyroid Function Tests: Recent Labs    10/03/23 1625  TSH 0.97   Anemia Panel: Recent Labs    10/03/23 1625  VITAMINB12 538   Urine analysis:    Component Value Date/Time   COLORURINE YELLOW 10/03/2023 1625   APPEARANCEUR CLEAR 10/03/2023 1625   LABSPEC <=1.005 (A) 10/03/2023 1625   PHURINE 7.0  10/03/2023 1625   GLUCOSEU NEGATIVE 10/03/2023 1625   HGBUR TRACE-INTACT (A) 10/03/2023 1625   BILIRUBINUR NEGATIVE 10/03/2023 1625   BILIRUBINUR neg 10/03/2023 1514   KETONESUR NEGATIVE 10/03/2023 1625   PROTEINUR Negative 10/03/2023 1514   UROBILINOGEN 0.2 10/03/2023 1625   UROBILINOGEN 0.2 10/03/2023 1514   NITRITE NEGATIVE 10/03/2023 1625   NITRITE neg 10/03/2023 1514   LEUKOCYTESUR NEGATIVE 10/03/2023 1625   LEUKOCYTESUR Negative 10/03/2023 1514    Radiological Exams on Admission: I have personally reviewed images CT Angio Chest PE W/Cm &/Or Wo Cm Addendum Date: 10/05/2023 ADDENDUM REPORT: 10/05/2023 01:42 ADDENDUM: These results were called by telephone at the time of interpretation on 10/05/2023 at 1:42 am to provider [facility], who verbally acknowledged these results. Electronically Signed   By: Helyn Numbers M.D.   On: 10/05/2023 01:42   Result Date: 10/05/2023 CLINICAL DATA:  High probability pulmonary embolism, dyspnea on exertion EXAM: CT ANGIOGRAPHY CHEST WITH CONTRAST TECHNIQUE: Multidetector CT imaging of the chest was performed using the standard protocol during bolus administration of intravenous contrast. Multiplanar CT image reconstructions and MIPs were obtained to evaluate the vascular anatomy. RADIATION DOSE REDUCTION: This exam was performed according to the departmental dose-optimization program which includes automated exposure control, adjustment of the mA and/or kV according to patient size and/or use of iterative reconstruction technique. CONTRAST:  80mL OMNIPAQUE IOHEXOL 350 MG/ML SOLN COMPARISON:  None Available. FINDINGS: Cardiovascular: There is adequate opacification of the pulmonary arterial tree. There are multiple branching intraluminal filling defects identified with lobar and segmental pulmonary arteries of the lungs bilaterally keeping with acute pulmonary emboli. The polyp burden is moderate. The central pulmonary arteries are of normal caliber. However,  there is CT evidence of right heart strain inversion of the normal RV/LV ratio (RV/LV = 1.1). No significant coronary artery calcification. Global cardiac size within limits. No pericardial effusion. Mild atherosclerotic calcification within thoracic aorta. No aortic aneurysm. Mediastinum/Nodes: No enlarged mediastinal, hilar, or axillary lymph nodes. Thyroid gland, trachea, and esophagus demonstrate no significant findings. Lungs/Pleura: Lungs are clear. No pleural effusion or pneumothorax. Upper Abdomen: 2.4 cm low-attenuation lesion within the visualized liver is better assessed on prior CT examination of 10/04/2023 as compatible with a simple cyst. No acute abnormality within visualized upper abdomen. Musculoskeletal: Degenerative changes are seen within the thoracic spine. No acute bone abnormality. No lytic or blastic bone lesion. Review of the MIP images confirms the above findings. IMPRESSION: 1. Acute pulmonary emboli with moderate clot burden. CT evidence of right heart strain (RV/LV Ratio = 1.1 ) consistent with at least submassive (intermediate risk) PE. The presence of right heart strain has been associated with an increased risk  of morbidity and mortality. Please refer to the "Code PE Focused" order set in EPIC. Aortic Atherosclerosis (ICD10-I70.0). Electronically Signed: By: Helyn Numbers M.D. On: 10/05/2023 01:28   DG Chest 2 View Result Date: 10/05/2023 CLINICAL DATA:  Dyspnea EXAM: CHEST - 2 VIEW COMPARISON:  None Available. FINDINGS: Lungs are well expanded, symmetric, and clear. No pneumothorax or pleural effusion. Cardiac size within normal limits. Pulmonary vascularity is normal. Osseous structures are age-appropriate. No acute bone abnormality. IMPRESSION: No active cardiopulmonary disease. Electronically Signed   By: Helyn Numbers M.D.   On: 10/05/2023 01:28   DG Chest 2 View Result Date: 10/04/2023 CLINICAL DATA:  Shortness of breath EXAM: CHEST - 2 VIEW COMPARISON:  Chest radiograph  10/03/2023 FINDINGS: Normal cardiac and mediastinal contours. Aortic tortuosity. No large area pulmonary consolidation. No pleural effusion or pneumothorax. Thoracic spine degenerative changes. IMPRESSION: No active cardiopulmonary disease. Electronically Signed   By: Annia Belt M.D.   On: 10/04/2023 21:34   CT RENAL STONE STUDY Result Date: 10/04/2023 CLINICAL DATA:  Hematuria left-sided low back pain fatigue EXAM: CT ABDOMEN AND PELVIS WITHOUT CONTRAST TECHNIQUE: Multidetector CT imaging of the abdomen and pelvis was performed following the standard protocol without IV contrast. RADIATION DOSE REDUCTION: This exam was performed according to the departmental dose-optimization program which includes automated exposure control, adjustment of the mA and/or kV according to patient size and/or use of iterative reconstruction technique. COMPARISON:  None Available. FINDINGS: Lower chest: No acute abnormality. Hepatobiliary: Simple hepatic cyst along the anterior margin of the right lobe of the liver segment 8 2.7 x 2.0 cm in diameter. No other focal hepatic lesions. Prior cholecystectomy without biliary dilatation. Pancreas: Unremarkable. No pancreatic ductal dilatation or surrounding inflammatory changes. Spleen: Normal in size without focal abnormality. Adrenals/Urinary Tract: Adrenal glands are unremarkable. Kidneys are normal, without renal calculi, focal lesion, or hydronephrosis. Bladder is unremarkable. Visualized portions of the ureters and bladder unremarkable. Stomach/Bowel: Stomach is within normal limits. Appendix appears normal. No evidence of bowel wall thickening, distention, or inflammatory changes. 8 mm calcification posterior to the cecal appendix could correlate with peritoneal calcification of no clinical significance. Vascular/Lymphatic: Aortic atherosclerosis. No enlarged abdominal or pelvic lymph nodes. Reproductive: Status post hysterectomy. No adnexal masses. Other: No abdominal wall hernia  or abnormality. No abdominopelvic ascites. Multilevel degenerative disc disease lumbosacral spine with severe degenerative disc disease L4-5 and L5-S1 Musculoskeletal: IMPRESSION: *No acute abnormality of the abdomen or pelvis. *Simple hepatic cyst. *Prior cholecystectomy and hysterectomy. *Aortic atherosclerosis. 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 Electronically Signed   By: Shaaron Adler M.D.   On: 10/04/2023 14:27     EKG: My personal interpretation of EKG shows: EKG showing normal sinus rhythm heart rate 72, borderline left axis deviation.  Low voltage precordial leads.     Assessment/Plan: Principal Problem:   Acute pulmonary embolism (HCC) Active Problems:   Hypernatremia   Essential hypertension   Gastroesophageal reflux disease   Hyperlipidemia   History of seizure   OSA (obstructive sleep apnea)   Dyspnea on exertion    Assessment and Plan: Acute pulmonary embolism Dyspnea on exertion secondary to acute pulmonary embolism -Patient presenting with dyspnea on exertion which has been progressively getting worse for last 1 week. - Hemodynamically stable blood pressure within good range.  O2 sat 96 to 100% room air, respiratory rate 18 and heart rate 60-75. - Normal BNP level.  Pending troponin. - Chest x-ray no acute disease process - CTA chest showing  acute pulmonary embolism.  There are multiple branching intraluminal filling defect in identified the lobar and segmental pulmonary arteries of the lung bilaterally.  The presence of right ventricular strain associated with increased risk of mortality morbidity. - In the ED heparin drip has been initiated. - Discussed case with PCCM physician Dr. Sherryll Burger, per discussion given patient has normal BNP and blood pressure within good range and no evidence of hypoxia and as the location of the pulmonary embolism in subsegmental pulmonary arteries which will be managed by  anticoagulation and there is no need for surgical intervention. - Continue IV heparin drip.  Eventually will transition to oral DOAC. - Checking hypercoagulable panel (added the hypercoagulable panel on previous blood collection before initiation of heparin drip for adequate result interpretation). -Concern for development of pulmonary embolism in the setting of sedentary lifestyle. -Obtaining echocardiogram and bilateral lower extremity ultrasound to rule out DVT. - Continue cardiac monitoring. Avoiding blood pressure given which will decrease preload. -Admitting patient to stepdown unit.  Hypernatremia -Elevated serum sodium level 149.  Patient is 2.7 L free water deficit. - Starting D5W 100 cc/h. -Goal to improve sodium around 140 in next 24 hours.   -Chronic monitor serum sodium 3 times daily and adjust IV fluid as needed.  Essential hypertension -Holding blood pressure regimen to avoid hypotension/preload in the setting of right ventricular strain in the context of acute pulmonary embolism. -Continue to monitor blood pressure.  History of seizure -Continue Keppra twice daily and Topamax twice daily  GERD -Continue Protonix  History of OSA on CPAP -Continue CPAP at bedtime  Hyperlipidemia -Continue Lipitor   DVT prophylaxis:  IV heparin gtts Code Status:  Full Code Diet: Heart healthy-carb modified diet Family Communication:   Family was present at bedside, at the time of interview. Opportunity was given to ask question and all questions were answered satisfactorily.  Disposition Plan: Continue heparin drip and eventually transition to oral DOAC.  Pending echocardiogram and bilateral lower extremity ultrasound. Consults: None at this moment Admission status:   Inpatient, Step Down Unit  Severity of Illness: The appropriate patient status for this patient is INPATIENT. Inpatient status is judged to be reasonable and necessary in order to provide the required intensity of  service to ensure the patient's safety. The patient's presenting symptoms, physical exam findings, and initial radiographic and laboratory data in the context of their chronic comorbidities is felt to place them at high risk for further clinical deterioration. Furthermore, it is not anticipated that the patient will be medically stable for discharge from the hospital within 2 midnights of admission.   * I certify that at the point of admission it is my clinical judgment that the patient will require inpatient hospital care spanning beyond 2 midnights from the point of admission due to high intensity of service, high risk for further deterioration and high frequency of surveillance required.Marland Kitchen    Tereasa Coop, MD [facility]  How to contact the [facility] Attending or Consulting provider 7A - 7P or covering provider during after hours 7P -7A, for this patient.  Check the care team in [facility] and look for a) attending/consulting [facility] provider listed and b) the [facility] team listed Log into [email or web address] and use [place]'s universal password to access. If you do not have the password, please contact the hospital operator. Locate the [facility] provider you are looking for under [facility] and page to a number that you can be directly reached. If you still have difficulty reaching the provider,  please page the [facility] (Director on Call) for the Hospitalists listed on amion for assistance.  10/05/2023, 2:40 AM

## 2023-10-05 NOTE — Progress Notes (Signed)
 PHARMACY - ANTICOAGULATION CONSULT NOTE  Pharmacy Consult for heparin Indication: pulmonary embolus with evidence of RHS  No Known Allergies  Patient Measurements:   Heparin Dosing Weight: 71 kg  Vital Signs: Temp: 97.8 F (36.6 C) (03/14 0100) Temp Source: Oral (03/14 0100) BP: 131/66 (03/14 0100) Pulse Rate: 60 (03/14 0100)  Labs: Recent Labs    10/03/23 1625 10/04/23 2259 10/04/23 2307  HGB 12.5 11.7* 12.2  HCT 39.8 39.4 36.0  PLT 180.0 163  --   CREATININE 0.91 0.83 0.90    Estimated Creatinine Clearance: 56.2 mL/min (by C-G formula based on SCr of 0.9 mg/dL).   Medical History: Past Medical History:  Diagnosis Date   Acid reflux    Allergy    Anxiety    DDD (degenerative disc disease), lumbar    Depression    Facet joint disease    GERD (gastroesophageal reflux disease)    High cholesterol    Hypertension    Low vitamin D level    OSA (obstructive sleep apnea)    Primary osteoarthritis involving multiple joints    Seizures (HCC)    Sleep apnea    C PAP    Medications:  No oral anticoagulation PTA  Assessment: 78 yr female with c/o shortness of breath CTAngio = + acute PE with moderate clot burden. CT evidence of RHS  Goal of Therapy:  Heparin level 0.3-0.7 units/ml Monitor platelets by anticoagulation protocol: Yes   Plan:  Heparin 4000 unit IV bolus x 1 Heparin gtt @ 1200 units/hr Heparin level 8 hr after infusion started Daily heparin level & CBC  Maximiano Lott, Joselyn Glassman, PharmD 10/05/2023,1:50 AM

## 2023-10-06 ENCOUNTER — Other Ambulatory Visit (HOSPITAL_COMMUNITY): Payer: Self-pay

## 2023-10-06 ENCOUNTER — Inpatient Hospital Stay (HOSPITAL_COMMUNITY)

## 2023-10-06 DIAGNOSIS — I2609 Other pulmonary embolism with acute cor pulmonale: Secondary | ICD-10-CM | POA: Diagnosis not present

## 2023-10-06 DIAGNOSIS — I82432 Acute embolism and thrombosis of left popliteal vein: Secondary | ICD-10-CM

## 2023-10-06 DIAGNOSIS — I2699 Other pulmonary embolism without acute cor pulmonale: Secondary | ICD-10-CM | POA: Diagnosis not present

## 2023-10-06 LAB — HEPARIN LEVEL (UNFRACTIONATED): Heparin Unfractionated: 0.77 [IU]/mL — ABNORMAL HIGH (ref 0.30–0.70)

## 2023-10-06 LAB — ECHOCARDIOGRAM COMPLETE
AR max vel: 2.18 cm2
AV Area VTI: 2.21 cm2
AV Area mean vel: 1.93 cm2
AV Mean grad: 2 mmHg
AV Peak grad: 3.9 mmHg
Ao pk vel: 0.98 m/s
Area-P 1/2: 2.12 cm2
Calc EF: 65.8 %
Height: 62 in
MV VTI: 2.36 cm2
S' Lateral: 2.2 cm
Single Plane A2C EF: 68.9 %
Single Plane A4C EF: 69.8 %
Weight: 3252.23 [oz_av]

## 2023-10-06 LAB — CBC
HCT: 35.6 % — ABNORMAL LOW (ref 36.0–46.0)
Hemoglobin: 10.7 g/dL — ABNORMAL LOW (ref 12.0–15.0)
MCH: 27.2 pg (ref 26.0–34.0)
MCHC: 30.1 g/dL (ref 30.0–36.0)
MCV: 90.4 fL (ref 80.0–100.0)
Platelets: 161 10*3/uL (ref 150–400)
RBC: 3.94 MIL/uL (ref 3.87–5.11)
RDW: 13.2 % (ref 11.5–15.5)
WBC: 9.1 10*3/uL (ref 4.0–10.5)
nRBC: 0 % (ref 0.0–0.2)

## 2023-10-06 LAB — PROTEIN C, TOTAL: Protein C, Total: 89 % (ref 60–150)

## 2023-10-06 LAB — HOMOCYSTEINE: Homocysteine: 10.2 umol/L (ref 0.0–19.2)

## 2023-10-06 MED ORDER — APIXABAN 5 MG PO TABS: 5.0000 mg | ORAL_TABLET | Freq: Two times a day (BID) | ORAL | Status: DC

## 2023-10-06 MED ORDER — APIXABAN (ELIQUIS) VTE STARTER PACK (10MG AND 5MG)
ORAL_TABLET | ORAL | 0 refills | Status: DC
  Filled 2023-10-06: qty 74, 30d supply, fill #0

## 2023-10-06 MED ORDER — APIXABAN 5 MG PO TABS
10.0000 mg | ORAL_TABLET | Freq: Two times a day (BID) | ORAL | Status: DC
  Administered 2023-10-06: 10 mg via ORAL
  Filled 2023-10-06: qty 2

## 2023-10-06 NOTE — Discharge Instructions (Addendum)
 Information on my medicine - ELIQUIS (apixaban)    Why was Eliquis prescribed for you? Eliquis was prescribed to treat blood clots that may have been found in the veins of your legs (deep vein thrombosis) or in your lungs (pulmonary embolism) and to reduce the risk of them occurring again.  What do You need to know about Eliquis ? The starting dose is 10 mg (two 5 mg tablets) taken TWICE daily for the FIRST SEVEN (7) DAYS, then on (enter date)  10/13/23  the dose is reduced to ONE 5 mg tablet taken TWICE daily.  Eliquis may be taken with or without food.   Try to take the dose about the same time in the morning and in the evening. If you have difficulty swallowing the tablet whole please discuss with your pharmacist how to take the medication safely.  Take Eliquis exactly as prescribed and DO NOT stop taking Eliquis without talking to the doctor who prescribed the medication.  Stopping may increase your risk of developing a new blood clot.  Refill your prescription before you run out.  After discharge, you should have regular check-up appointments with your healthcare provider that is prescribing your Eliquis.    What do you do if you miss a dose? If a dose of ELIQUIS is not taken at the scheduled time, take it as soon as possible on the same day and twice-daily administration should be resumed. The dose should not be doubled to make up for a missed dose.  Important Safety Information A possible side effect of Eliquis is bleeding. You should call your healthcare provider right away if you experience any of the following: Bleeding from an injury or your nose that does not stop. Unusual colored urine (red or dark brown) or unusual colored stools (red or black). Unusual bruising for unknown reasons. A serious fall or if you hit your head (even if there is no bleeding).  Some medicines may interact with Eliquis and might increase your risk of bleeding or clotting while on Eliquis. To  help avoid this, consult your healthcare provider or pharmacist prior to using any new prescription or non-prescription medications, including herbals, vitamins, non-steroidal anti-inflammatory drugs (NSAIDs) and supplements.  This website has more information on Eliquis (apixaban): http://[email or web address].com/eliquis/home

## 2023-10-06 NOTE — Progress Notes (Signed)
 PHARMACY - ANTICOAGULATION CONSULT NOTE  Pharmacy Consult for apixaban Indication: pulmonary embolus with evidence of RHS  No Known Allergies  Patient Measurements: Height: 5\' 2"  (157.5 cm) Weight: 92.2 kg (203 lb 4.2 oz) IBW/kg (Calculated) : 50.1 Heparin Dosing Weight: 71 kg  Vital Signs: Temp: 97.8 F (36.6 C) (03/15 1146) Temp Source: Oral (03/15 1146) BP: 125/50 (03/15 1100) Pulse Rate: 69 (03/15 1100)  Labs: Recent Labs    10/04/23 2259 10/04/23 2307 10/05/23 0213 10/05/23 0256 10/05/23 0422 10/05/23 0544 10/05/23 1009 10/05/23 2106 10/06/23 0237  HGB 11.7* 12.2  --   --   --  10.7* 10.6*  --  10.7*  HCT 39.4 36.0  --   --   --  35.8* 35.5*  --  35.6*  PLT 163  --   --   --   --  160 155  --  161  APTT  --   --   --   --  142*  --   --   --   --   LABPROT  --   --   --   --  14.2  --   --   --   --   INR  --   --   --   --  1.1  --   --   --   --   HEPARINUNFRC  --   --   --   --   --   --  >1.10* 0.70 0.77*  CREATININE 0.83 0.90  --   --   --  0.71  --   --   --   TROPONINIHS  --   --  8 8  --   --   --   --   --     Estimated Creatinine Clearance: 63.2 mL/min (by C-G formula based on SCr of 0.71 mg/dL).   Medications:  No oral anticoagulation PTA  Assessment: 72 yoF presents w/ SOB; found to have acute PE with moderate clot burden. CTA does show RH strain.    Today, 10/06/2023: Plan to transition to apixaban today  Hgb 10.7, plt 161  SCr < 1   Goal of Therapy:  Heparin level 0.3-0.7 units/ml Monitor platelets by anticoagulation protocol: Yes   Plan:  Stop heparin and start apixaban 10 mg PO BID x 7 days then 5 mg PO BID Monitor Scr, CBC  Monitor for signs and symptoms of bleeding   Adalberto Cole, PharmD, BCPS 10/06/2023 1:33 PM

## 2023-10-06 NOTE — Progress Notes (Signed)
  Echocardiogram 2D Echocardiogram has been performed.  Ocie Doyne RDCS 10/06/2023, 8:57 AM

## 2023-10-06 NOTE — Progress Notes (Signed)
   10/06/23 1441  AVS Discharge Documentation  AVS Discharge Instructions Including Medications Provided to patient/caregiver  Name of Person Receiving AVS Discharge Instructions Including Medications Gina Key & [name]  Name of Clinician That Reviewed AVS Discharge Instructions Including Medications Donnita Falls, RN

## 2023-10-06 NOTE — Progress Notes (Signed)
   10/06/23 1356  TOC Brief Assessment  Insurance and Status Reviewed  Patient has primary care physician Yes  Home environment has been reviewed Resides in single family home with family  Prior level of function: Independent with ADLs at baseline  Prior/Current Home Services No current home services  Social Drivers of Health Review SDOH reviewed no interventions necessary  Readmission risk has been reviewed Yes  Transition of care needs no transition of care needs at this time

## 2023-10-06 NOTE — Plan of Care (Signed)
  Problem: Education: Goal: Knowledge of General Education information will improve Description: Including pain rating scale, medication(s)/side effects and non-pharmacologic comfort measures Outcome: Progressing   Problem: Health Behavior/Discharge Planning: Goal: Ability to manage health-related needs will improve Outcome: Progressing   Problem: Clinical Measurements: Goal: Ability to maintain clinical measurements within normal limits will improve Outcome: Progressing   Problem: Elimination: Goal: Will not experience complications related to urinary retention Outcome: Progressing   Problem: Coping: Goal: Level of anxiety will decrease Outcome: Progressing

## 2023-10-06 NOTE — Progress Notes (Signed)
 PROGRESS NOTE    Gina Key  ZOX:[id number] DOB: Jan 10, 1946 DOA: 10/04/2023 PCP: Deeann Saint, MD    Brief Narrative:   Gina Key is a 78 y.o. female with past medical history significant for HTN, HLD, seizure disorder, OSA noncompliant with CPAP, GERD, memory impairment who presented to [facility] ED on 10/04/2023 with progressive dyspnea.  Ongoing for several weeks worse with exertion.  Additionally also with generalized weakness.  Denies fever, no chills, no cough, no chest pain.  Denies any recent travel, no injuries, no sick contacts.  In the ED, temperature 97.9 F, HR 75, RR 18, BP 155/79, SpO2 100% on room air.  WBC 9.2, hemoglobin 11.7, platelet count 163.  Sodium 145, potassium 3.9, chloride 115, CO2 25, glucose 105, BUN 16, creatinine 0.83.  BNP 48.1.  High-sensitivity troponin 8.  Chest x-ray with no active cardiopulmonary disease process.  CT renal stone study with no acute abnormality of the abdomen/pelvis, noted simple hepatic cyst, cholecystectomy and hysterectomy, aortic atherosclerosis, multilevel degenerative disc disease LS spine.  CT angiogram chest with acute pulmonary emboli with moderate clot burden, evidence of right heart strain on CT. EDP initiated heparin drip.  TRH consulted for admission for further evaluation management of acute PE with right heart strain.  Assessment & Plan:   Acute pulmonary embolism, submassive with right heart strain Left lower extremity DVT Patient presenting with several week history of progressive shortness of breath, worse with exertion; associated with generalized weakness.  Patient is afebrile without leukocytosis.  CT angiogram chest notable for acute pulmonary emboli with moderate clot burden and evidence of right heart strain.  Vascular duplex ultrasound positive for DVT left popliteal vein. -- TTE: Pending -- Continue heparin drip, pharmacy consult for dosing/monitoring -- Monitor on telemetry  Essential  hypertension -- Metoprolol succinate 25 mg p.o. daily  Hyperlipidemia -- Atorvastatin 40 mg p.o. daily  Seizure disorder -- Keppra 500 mg p.o. twice daily -- Topamax 100 mg p.o. twice daily  OSA Noncompliant with CPAP  GERD -- Protonix 20 mg p.o. daily  Cognitive impairment -- Delirium precautions -- Get up during the day -- Encourage a familiar face to remain present throughout the day -- Keep blinds open and lights on during daylight hours -- Minimize the use of opioids/benzodiazepines  Generalized weakness Patient reports lives at home with her daughter.  Utilizes a cane or walker for ambulation.  Reports progressive weakness over the last few weeks. -- PT evaluation   DVT prophylaxis: SCDs Start: 10/05/23 0214    Code Status: Full Code Family Communication: Updated patient's daughter Kathie Rhodes via telephone this morning  Disposition Plan:  Level of care: Telemetry Status is: Inpatient Remains inpatient appropriate because: IV heparin drip, awaiting results of TTE before transition to DOAC    Consultants:  None  Procedures:  TTE  Antimicrobials:  None   Subjective: Patient seen examined bedside, lying in bed.  No specific complaints this morning.  Remains on heparin drip.  Awaiting echocardiogram to be accomplished.  Continues to endorse generalized weakness.  Denies headache, no dizziness, no chest pain, no palpitations, no shortness of breath, no abdominal pain, no fever/chills/night sweats, no nausea/vomiting/diarrhea, no focal weakness, no paresthesia.  No acute events overnight per nursing staff.  Updated daughter via telephone this morning.  Stable to transfer to telemetry floor.  Objective: Vitals:   10/06/23 0400 10/06/23 0500 10/06/23 0800 10/06/23 0900  BP: 121/60 (!) 122/53  (!) 147/69  Pulse: 73 72 66 77  Resp: 11 14 20    Temp: 97.7 F (36.5 C)  97.6 F (36.4 C)   TempSrc: Oral  Oral   SpO2: 97% 97% 97% 95%  Weight:      Height:         Intake/Output Summary (Last 24 hours) at 10/06/2023 1027 Last data filed at 10/06/2023 0541 Gross per 24 hour  Intake 286.99 ml  Output --  Net 286.99 ml   Filed Weights   10/05/23 1345  Weight: 92.2 kg    Examination:  Physical Exam: GEN: NAD, alert and oriented x 3, wd/wn HEENT: NCAT, PERRL, EOMI, sclera clear, MMM PULM: CTAB w/o wheezes/crackles, normal respiratory effort, on room air CV: RRR w/o M/G/R GI: abd soft, NTND, NABS, no R/G/M MSK: no peripheral edema, muscle strength globally intact 5/5 bilateral upper/lower extremities NEURO: CN II-XII intact, no focal deficits, sensation to light touch intact PSYCH: normal mood/affect Integumentary: dry/intact, no rashes or wounds    Data Reviewed: I have personally reviewed following labs and imaging studies  CBC: Recent Labs  Lab 10/03/23 1625 10/04/23 2259 10/04/23 2307 10/05/23 0544 10/05/23 1009 10/06/23 0237  WBC 8.3 9.2  --  8.9 7.8 9.1  NEUTROABS 4.7 5.1  --   --   --   --   HGB 12.5 11.7* 12.2 10.7* 10.6* 10.7*  HCT 39.8 39.4 36.0 35.8* 35.5* 35.6*  MCV 85.3 90.0  --  89.1 89.9 90.4  PLT 180.0 163  --  160 155 161   Basic Metabolic Panel: Recent Labs  Lab 10/03/23 1625 10/04/23 2259 10/04/23 2307 10/05/23 0212 10/05/23 0544 10/05/23 0825 10/05/23 1421  NA 144 145 149* 143 140 138 142  K 3.9 3.9 3.9  --  3.3*  --   --   CL 112 115* 114*  --  112*  --   --   CO2 25 25  --   --  21*  --   --   GLUCOSE 73 105* 101*  --  111*  --   --   BUN 14 16 15   --  15  --   --   CREATININE 0.91 0.83 0.90  --  0.71  --   --   CALCIUM 10.1 9.3  --   --  8.8*  --   --    GFR: Estimated Creatinine Clearance: 63.2 mL/min (by C-G formula based on SCr of 0.71 mg/dL). Liver Function Tests: Recent Labs  Lab 10/03/23 1625 10/05/23 0544  AST 23 19  ALT 20 19  ALKPHOS 126* 103  BILITOT 0.4 0.4  PROT 7.4 6.4*  ALBUMIN 4.5 3.7   No results for input(s): "LIPASE", "AMYLASE" in the last 168 hours. No results  for input(s): "AMMONIA" in the last 168 hours. Coagulation Profile: Recent Labs  Lab 10/05/23 0422  INR 1.1   Cardiac Enzymes: No results for input(s): "CKTOTAL", "CKMB", "CKMBINDEX", "TROPONINI" in the last 168 hours. BNP (last 3 results) Recent Labs    10/03/23 1625  PROBNP 28.0   HbA1C: No results for input(s): "HGBA1C" in the last 72 hours. CBG: No results for input(s): "GLUCAP" in the last 168 hours. Lipid Profile: No results for input(s): "CHOL", "HDL", "LDLCALC", "TRIG", "CHOLHDL", "LDLDIRECT" in the last 72 hours. Thyroid Function Tests: Recent Labs    10/03/23 1625  TSH 0.97   Anemia Panel: Recent Labs    10/03/23 1625  VITAMINB12 538  FERRITIN CANCELED  IRON CANCELED   Sepsis Labs: No results for input(s): "PROCALCITON", "LATICACIDVEN" in  the last 168 hours.  Recent Results (from the past 240 hours)  Urine Culture     Status: None   Collection Time: 10/03/23  4:25 PM   Specimen: Blood  Result Value Ref Range Status   MICRO NUMBER: [id number]  Final   SPECIMEN QUALITY: Adequate  Final   Sample Source NOT GIVEN  Final   STATUS: FINAL  Final   Result: No Growth  Final  MRSA Next Gen by PCR, Nasal     Status: None   Collection Time: 10/05/23  1:47 PM   Specimen: Nasal Mucosa; Nasal Swab  Result Value Ref Range Status   MRSA by PCR Next Gen NOT DETECTED NOT DETECTED Final    Comment: (NOTE) The GeneXpert MRSA Assay (FDA approved for NASAL specimens only), is one component of a comprehensive MRSA colonization surveillance program. It is not intended to diagnose MRSA infection nor to guide or monitor treatment for MRSA infections. Test performance is not FDA approved in patients less than 77 years old. Performed at [facility], [address], [place], [place] [address]          Radiology Studies: VAS [place] LOWER EXTREMITY VENOUS (DVT) Result Date: 10/05/2023  Lower Venous DVT Study Patient Name:  [facility]  Date of  Exam:   10/05/2023 Medical Rec #: [id number]           Accession #:    [id number] Date of Birth: [birth year]           Patient Gender: F Patient Age:   27 years Exam Location:  [facility] Procedure:      VAS [place] LOWER EXTREMITY VENOUS (DVT) Referring Phys: Tereasa Coop --------------------------------------------------------------------------------  Indications: Pulmonary embolism.  Risk Factors: Confirmed PE. Anticoagulation: Heparin. Comparison Study: No prior studies. Performing Technologist: Chanda Busing RVT  Examination Guidelines: A complete evaluation includes B-mode imaging, spectral Doppler, color Doppler, and power Doppler as needed of all accessible portions of each vessel. Bilateral testing is considered an integral part of a complete examination. Limited examinations for reoccurring indications may be performed as noted. The reflux portion of the exam is performed with the patient in reverse Trendelenburg.  +---------+---------------+---------+-----------+----------+--------------+ RIGHT    CompressibilityPhasicitySpontaneityPropertiesThrombus Aging +---------+---------------+---------+-----------+----------+--------------+ CFV      Full           Yes      Yes                                 +---------+---------------+---------+-----------+----------+--------------+ SFJ      Full                                                        +---------+---------------+---------+-----------+----------+--------------+ FV Prox  Full                                                        +---------+---------------+---------+-----------+----------+--------------+ FV Mid   Full                                                        +---------+---------------+---------+-----------+----------+--------------+  FV DistalFull                                                        +---------+---------------+---------+-----------+----------+--------------+ PFV      Full                                                         +---------+---------------+---------+-----------+----------+--------------+ POP      Full           Yes      Yes                                 +---------+---------------+---------+-----------+----------+--------------+ PTV      Full                                                        +---------+---------------+---------+-----------+----------+--------------+ PERO     Full                                                        +---------+---------------+---------+-----------+----------+--------------+   +---------+---------------+---------+-----------+----------+--------------+ LEFT     CompressibilityPhasicitySpontaneityPropertiesThrombus Aging +---------+---------------+---------+-----------+----------+--------------+ CFV      Full           Yes      Yes                                 +---------+---------------+---------+-----------+----------+--------------+ SFJ      Full                                                        +---------+---------------+---------+-----------+----------+--------------+ FV Prox  Full                                                        +---------+---------------+---------+-----------+----------+--------------+ FV Mid   Full                                                        +---------+---------------+---------+-----------+----------+--------------+ FV DistalFull                                                        +---------+---------------+---------+-----------+----------+--------------+  PFV      Full                                                        +---------+---------------+---------+-----------+----------+--------------+ POP      Partial        Yes      Yes                  Acute          +---------+---------------+---------+-----------+----------+--------------+ PTV      Full                                                         +---------+---------------+---------+-----------+----------+--------------+ PERO     Full                                                        +---------+---------------+---------+-----------+----------+--------------+ Gastroc  Full                                                        +---------+---------------+---------+-----------+----------+--------------+    Summary: RIGHT: - There is no evidence of deep vein thrombosis in the lower extremity.  - No cystic structure found in the popliteal fossa.  LEFT: - Findings consistent with acute deep vein thrombosis involving the left popliteal vein.  - No cystic structure found in the popliteal fossa.  *See table(s) above for measurements and observations.    Preliminary    CT Angio Chest PE W/Cm &/Or Wo Cm Addendum Date: 10/05/2023 ADDENDUM REPORT: 10/05/2023 01:42 ADDENDUM: These results were called by telephone at the time of interpretation on 10/05/2023 at 1:42 am to provider [facility], who verbally acknowledged these results. Electronically Signed   By: Helyn Numbers M.D.   On: 10/05/2023 01:42   Result Date: 10/05/2023 CLINICAL DATA:  High probability pulmonary embolism, dyspnea on exertion EXAM: CT ANGIOGRAPHY CHEST WITH CONTRAST TECHNIQUE: Multidetector CT imaging of the chest was performed using the standard protocol during bolus administration of intravenous contrast. Multiplanar CT image reconstructions and MIPs were obtained to evaluate the vascular anatomy. RADIATION DOSE REDUCTION: This exam was performed according to the departmental dose-optimization program which includes automated exposure control, adjustment of the mA and/or kV according to patient size and/or use of iterative reconstruction technique. CONTRAST:  80mL OMNIPAQUE IOHEXOL 350 MG/ML SOLN COMPARISON:  None Available. FINDINGS: Cardiovascular: There is adequate opacification of the pulmonary arterial tree. There are multiple branching intraluminal filling  defects identified with lobar and segmental pulmonary arteries of the lungs bilaterally keeping with acute pulmonary emboli. The polyp burden is moderate. The central pulmonary arteries are of normal caliber. However, there is CT evidence of right heart strain inversion of the normal RV/LV ratio (RV/LV = 1.1). No significant coronary artery calcification. Global cardiac size within limits. No pericardial effusion. Mild atherosclerotic  calcification within thoracic aorta. No aortic aneurysm. Mediastinum/Nodes: No enlarged mediastinal, hilar, or axillary lymph nodes. Thyroid gland, trachea, and esophagus demonstrate no significant findings. Lungs/Pleura: Lungs are clear. No pleural effusion or pneumothorax. Upper Abdomen: 2.4 cm low-attenuation lesion within the visualized liver is better assessed on prior CT examination of 10/04/2023 as compatible with a simple cyst. No acute abnormality within visualized upper abdomen. Musculoskeletal: Degenerative changes are seen within the thoracic spine. No acute bone abnormality. No lytic or blastic bone lesion. Review of the MIP images confirms the above findings. IMPRESSION: 1. Acute pulmonary emboli with moderate clot burden. CT evidence of right heart strain (RV/LV Ratio = 1.1 ) consistent with at least submassive (intermediate risk) PE. The presence of right heart strain has been associated with an increased risk of morbidity and mortality. Please refer to the "Code PE Focused" order set in EPIC. Aortic Atherosclerosis (ICD10-I70.0). Electronically Signed: By: Helyn Numbers M.D. On: 10/05/2023 01:28   DG Chest 2 View Result Date: 10/04/2023 CLINICAL DATA:  Shortness of breath EXAM: CHEST - 2 VIEW COMPARISON:  Chest radiograph 10/03/2023 FINDINGS: Normal cardiac and mediastinal contours. Aortic tortuosity. No large area pulmonary consolidation. No pleural effusion or pneumothorax. Thoracic spine degenerative changes. IMPRESSION: No active cardiopulmonary disease.  Electronically Signed   By: Annia Belt M.D.   On: 10/04/2023 21:34   CT RENAL STONE STUDY Result Date: 10/04/2023 CLINICAL DATA:  Hematuria left-sided low back pain fatigue EXAM: CT ABDOMEN AND PELVIS WITHOUT CONTRAST TECHNIQUE: Multidetector CT imaging of the abdomen and pelvis was performed following the standard protocol without IV contrast. RADIATION DOSE REDUCTION: This exam was performed according to the departmental dose-optimization program which includes automated exposure control, adjustment of the mA and/or kV according to patient size and/or use of iterative reconstruction technique. COMPARISON:  None Available. FINDINGS: Lower chest: No acute abnormality. Hepatobiliary: Simple hepatic cyst along the anterior margin of the right lobe of the liver segment 8 2.7 x 2.0 cm in diameter. No other focal hepatic lesions. Prior cholecystectomy without biliary dilatation. Pancreas: Unremarkable. No pancreatic ductal dilatation or surrounding inflammatory changes. Spleen: Normal in size without focal abnormality. Adrenals/Urinary Tract: Adrenal glands are unremarkable. Kidneys are normal, without renal calculi, focal lesion, or hydronephrosis. Bladder is unremarkable. Visualized portions of the ureters and bladder unremarkable. Stomach/Bowel: Stomach is within normal limits. Appendix appears normal. No evidence of bowel wall thickening, distention, or inflammatory changes. 8 mm calcification posterior to the cecal appendix could correlate with peritoneal calcification of no clinical significance. Vascular/Lymphatic: Aortic atherosclerosis. No enlarged abdominal or pelvic lymph nodes. Reproductive: Status post hysterectomy. No adnexal masses. Other: No abdominal wall hernia or abnormality. No abdominopelvic ascites. Multilevel degenerative disc disease lumbosacral spine with severe degenerative disc disease L4-5 and L5-S1 Musculoskeletal: IMPRESSION: *No acute abnormality of the abdomen or pelvis. *Simple  hepatic cyst. *Prior cholecystectomy and hysterectomy. *Aortic atherosclerosis. 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 Electronically Signed   By: Shaaron Adler M.D.   On: 10/04/2023 14:27        Scheduled Meds:  atorvastatin  40 mg Oral Daily   Chlorhexidine Gluconate Cloth  6 each Topical Daily   docusate sodium  100 mg Oral BID   levETIRAcetam  500 mg Oral BID   metoprolol succinate  25 mg Oral Daily   pantoprazole  20 mg Oral Daily   sodium chloride flush  3 mL Intravenous Q12H   topiramate  100 mg Oral BID   Continuous Infusions:  heparin  800 Units/hr (10/06/23 0541)     LOS: 1 day    Time spent: 51 minutes spent on 10/06/2023 caring for this patient face-to-face including chart review, ordering labs/tests, documenting, discussion with nursing staff, consultants, updating family and interview/physical exam    Alvira Philips [place], DO [facility] Available via Epic secure chat 7am-7pm After these hours, please refer to coverage provider listed on [email or web address] 10/06/2023, 10:27 AM

## 2023-10-06 NOTE — Discharge Summary (Signed)
 Physician Discharge Summary  Ayahna Solazzo UEA:[id number] DOB: [birth year] DOA: 10/04/2023  PCP: Deeann Saint, MD  Admit date: 10/04/2023 Discharge date: 10/06/2023  Admitted From: Home Disposition: Home  Recommendations for Outpatient Follow-up:  Follow up with PCP in 1-2 weeks, will need refills for Eliquis Started on Eliquis for acute PE/DVT Holding aspirin while new started on Eliquis Follow-up hypercoagulable workup: Beta-2 glycoprotein, cardiolipin antibody, factor V Leiden, homocystine, lupus anticoagulant, protein C/S, prothrombin gene mutation that were pending at time of discharge.  Home Health: No Equipment/Devices: None  Discharge Condition: Stable CODE STATUS: Full code Diet recommendation: Heart healthy diet  History of present illness:  Gene Colee is a 78 y.o. female with past medical history significant for HTN, HLD, seizure disorder, OSA noncompliant with CPAP, GERD, memory impairment who presented to [facility] ED on 10/04/2023 with progressive dyspnea.  Ongoing for several weeks worse with exertion.  Additionally also with generalized weakness.  Denies fever, no chills, no cough, no chest pain.  Denies any recent travel, no injuries, no sick contacts.   In the ED, temperature 97.9 F, HR 75, RR 18, BP 155/79, SpO2 100% on room air.  WBC 9.2, hemoglobin 11.7, platelet count 163.  Sodium 145, potassium 3.9, chloride 115, CO2 25, glucose 105, BUN 16, creatinine 0.83.  BNP 48.1.  High-sensitivity troponin 8.  Chest x-ray with no active cardiopulmonary disease process.  CT renal stone study with no acute abnormality of the abdomen/pelvis, noted simple hepatic cyst, cholecystectomy and hysterectomy, aortic atherosclerosis, multilevel degenerative disc disease LS spine.  CT angiogram chest with acute pulmonary emboli with moderate clot burden, evidence of right heart strain on CT. EDP initiated heparin drip.  TRH consulted for admission for further  evaluation management of acute PE with right heart strain.  Hospital course:  Acute pulmonary embolism, submassive with right heart strain Left lower extremity DVT Patient presenting with several week history of progressive shortness of breath, worse with exertion; associated with generalized weakness.  Patient is afebrile without leukocytosis.  CT angiogram chest notable for acute pulmonary emboli with moderate clot burden and evidence of right heart strain.  Vascular duplex ultrasound positive for DVT left popliteal vein.  TTE with LVEF 60-35%, no LV regional wall motion normalities, RV systolic function within normal limits, grade 1 diastolic dysfunction, IVC normal.  Patient was noted started on a heparin drip and transition to Eliquis.  Outpatient follow-up with PCP for medication refills.   Essential hypertension Continue metoprolol succinate 25 mg p.o. daily   Hyperlipidemia Continue atorvastatin 40 mg p.o. daily   Seizure disorder Continue Keppra 500 mg p.o. twice daily, Topamax 100 mg p.o. twice daily   OSA Noncompliant with CPAP   GERD Protonix 20 mg p.o. daily    Discharge Diagnoses:  Principal Problem:   Acute pulmonary embolism (HCC) Active Problems:   Hypernatremia   Essential hypertension   Gastroesophageal reflux disease   Hyperlipidemia   History of seizure   OSA (obstructive sleep apnea)   Dyspnea on exertion    Discharge Instructions  Discharge Instructions     Call MD for:  difficulty breathing, headache or visual disturbances   Complete by: As directed    Call MD for:  extreme fatigue   Complete by: As directed    Call MD for:  persistant dizziness or light-headedness   Complete by: As directed    Call MD for:  persistant nausea and vomiting   Complete by: As directed    Call  MD for:  severe uncontrolled pain   Complete by: As directed    Call MD for:  temperature >100.4   Complete by: As directed    Diet - low sodium heart healthy   Complete  by: As directed    Increase activity slowly   Complete by: As directed       Allergies as of 10/06/2023   No Known Allergies      Medication List     PAUSE taking these medications    aspirin EC 325 MG tablet Wait to take this until your doctor or other care provider tells you to start again. Take 325 mg by mouth daily.       TAKE these medications    Apixaban Starter Pack (10mg  and 5mg ) Commonly known as: ELIQUIS STARTER PACK Take as directed on package: start with two-5mg  tablets twice daily for 7 days. On day 8, switch to one-5mg  tablet twice daily.   atorvastatin 40 MG tablet Commonly known as: LIPITOR TAKE 1 TABLET BY MOUTH DAILY. SCHEDULE ANNUAL VISIT FOR FUTURE REFILLS   HM Lidocaine Patch 4 % Generic drug: lidocaine Place 1 patch onto the skin daily as needed.   ipratropium 0.06 % nasal spray Commonly known as: ATROVENT PLACE 2 SPRAYS INTO BOTH NOSTRILS 4 TIMES DAILY. What changed: See the new instructions.   levETIRAcetam 500 MG tablet Commonly known as: KEPPRA Take 1 tablet (500 mg total) by mouth 2 (two) times daily.   losartan 100 MG tablet Commonly known as: COZAAR TAKE 1 TABLET BY MOUTH EVERY DAY   metoprolol succinate 25 MG 24 hr tablet Commonly known as: TOPROL-XL TAKE 1 TABLET (25 MG TOTAL) BY MOUTH DAILY.   montelukast 10 MG tablet Commonly known as: SINGULAIR TAKE 1 TABLET BY MOUTH EVERYDAY AT BEDTIME   oxyCODONE 5 MG immediate release tablet Commonly known as: Roxicodone Take 1-1.5 tablets (5-7.5 mg total) by mouth every 12 (twelve) hours as needed.   pantoprazole 20 MG tablet Commonly known as: PROTONIX TAKE 1 TABLET BY MOUTH EVERY DAY   potassium chloride 10 MEQ tablet Commonly known as: KLOR-CON TAKE 1 TABLET BY MOUTH EVERY DAY   spironolactone 25 MG tablet Commonly known as: ALDACTONE TAKE 1 TABLET (25 MG TOTAL) BY MOUTH DAILY.   topiramate 100 MG tablet Commonly known as: TOPAMAX TAKE 1 TABLET BY MOUTH TWICE A DAY    Wings HL Adult Briefs/XL Misc As needed daily.        Follow-up Information     Deeann Saint, MD. Schedule an appointment as soon as possible for a visit in 1 week(s).   Specialty: Family Medicine Why: Refills for Eliquis Contact information: [address] Christena Flake [place] [address] [phone]                No Known Allergies  Consultations: None   Procedures/Studies: ECHOCARDIOGRAM COMPLETE Result Date: 10/06/2023    ECHOCARDIOGRAM REPORT   Patient Name:   [facility] Date of Exam: 10/06/2023 Medical Rec #:  [id number]          Height:       62.0 in Accession #:    [id number]         Weight:       203.3 lb Date of Birth:  Jan 17, 1946          BSA:          1.925 m Patient Age:    21 years  BP:           160/62 mmHg Patient Gender: F                  HR:           64 bpm. Exam Location:  Inpatient Procedure: 2D Echo, Cardiac Doppler and Color Doppler (Both Spectral and Color            Flow Doppler were utilized during procedure). Indications:    Pulmonary embolus  History:        Patient has no prior history of Echocardiogram examinations.                 Risk Factors:Hypertension, Dyslipidemia and Sleep Apnea.  Sonographer:    Vern Claude Referring Phys: [id number] SUBRINA SUNDIL IMPRESSIONS  1. Left ventricular ejection fraction, by estimation, is 60 to 65%. The left ventricle has normal function. The left ventricle has no regional wall motion abnormalities. There is mild asymmetric left ventricular hypertrophy of the basal-septal segment. Left ventricular diastolic parameters are consistent with Grade I diastolic dysfunction (impaired relaxation).  2. Right ventricular systolic function is normal. The right ventricular size is normal. Tricuspid regurgitation signal is inadequate for assessing PA pressure.  3. No evidence of mitral valve regurgitation.  4. The aortic valve was not well visualized. Aortic valve regurgitation is not visualized.  5. The  inferior vena cava is normal in size with greater than 50% respiratory variability, suggesting right atrial pressure of 3 mmHg. FINDINGS  Left Ventricle: Left ventricular ejection fraction, by estimation, is 60 to 65%. The left ventricle has normal function. The left ventricle has no regional wall motion abnormalities. The left ventricular internal cavity size was normal in size. There is  mild asymmetric left ventricular hypertrophy of the basal-septal segment. Left ventricular diastolic parameters are consistent with Grade I diastolic dysfunction (impaired relaxation). Right Ventricle: The right ventricular size is normal. Right ventricular systolic function is normal. Tricuspid regurgitation signal is inadequate for assessing PA pressure. Left Atrium: Left atrial size was normal in size. Right Atrium: Right atrial size was normal in size. Pericardium: There is no evidence of pericardial effusion. Mitral Valve: No evidence of mitral valve regurgitation. MV peak gradient, 2.5 mmHg. The mean mitral valve gradient is 1.0 mmHg. Tricuspid Valve: Tricuspid valve regurgitation is not demonstrated. Aortic Valve: The aortic valve was not well visualized. Aortic valve regurgitation is not visualized. Aortic valve mean gradient measures 2.0 mmHg. Aortic valve peak gradient measures 3.9 mmHg. Aortic valve area, by VTI measures 2.21 cm. Pulmonic Valve: Pulmonic valve regurgitation is not visualized. Aorta: The aortic root and ascending aorta are structurally normal, with no evidence of dilitation. Venous: The inferior vena cava is normal in size with greater than 50% respiratory variability, suggesting right atrial pressure of 3 mmHg. IAS/Shunts: The interatrial septum was not well visualized.  LEFT VENTRICLE PLAX 2D LVIDd:         3.20 cm     Diastology LVIDs:         2.20 cm     LV e' medial:    5.11 cm/s LV PW:         0.80 cm     LV E/e' medial:  10.5 LV IVS:        1.20 cm     LV e' lateral:   5.33 cm/s LVOT diam:      1.80 cm     LV E/e' lateral: 10.1 LV SV:  46 LV SV Index:   24 LVOT Area:     2.54 cm  LV Volumes (MOD) LV vol d, MOD A2C: 77.4 ml LV vol d, MOD A4C: 60.3 ml LV vol s, MOD A2C: 24.1 ml LV vol s, MOD A4C: 18.2 ml LV SV MOD A2C:     53.3 ml LV SV MOD A4C:     60.3 ml LV SV MOD BP:      45.0 ml RIGHT VENTRICLE             IVC RV Basal diam:  3.20 cm     IVC diam: 1.60 cm RV Mid diam:    2.70 cm RV S prime:     11.30 cm/s TAPSE (M-mode): 2.6 cm LEFT ATRIUM             Index        RIGHT ATRIUM          Index LA diam:        2.20 cm 1.14 cm/m   RA Area:     8.37 cm LA Vol (A2C):   29.9 ml 15.53 ml/m  RA Volume:   14.40 ml 7.48 ml/m LA Vol (A4C):   38.7 ml 20.11 ml/m LA Biplane Vol: 35.2 ml 18.29 ml/m  AORTIC VALVE                    PULMONIC VALVE AV Area (Vmax):    2.18 cm     PV Vmax:       0.74 m/s AV Area (Vmean):   1.93 cm     PV Peak grad:  2.2 mmHg AV Area (VTI):     2.21 cm AV Vmax:           98.20 cm/s AV Vmean:          69.900 cm/s AV VTI:            0.206 m AV Peak Grad:      3.9 mmHg AV Mean Grad:      2.0 mmHg LVOT Vmax:         84.20 cm/s LVOT Vmean:        52.900 cm/s LVOT VTI:          0.179 m LVOT/AV VTI ratio: 0.87  AORTA Ao Root diam: 2.70 cm Ao Asc diam:  3.00 cm MITRAL VALVE MV Area (PHT): 2.12 cm    SHUNTS MV Area VTI:   2.36 cm    Systemic VTI:  0.18 m MV Peak grad:  2.5 mmHg    Systemic Diam: 1.80 cm MV Mean grad:  1.0 mmHg MV Vmax:       0.80 m/s MV Vmean:      51.5 cm/s MV E velocity: 53.60 cm/s MV A velocity: 75.80 cm/s MV E/A ratio:  0.71 Photographer signed by Carolan Clines Signature Date/Time: 10/06/2023/12:19:36 PM    Final    VAS [place] LOWER EXTREMITY VENOUS (DVT) Result Date: 10/05/2023  Lower Venous DVT Study Patient Name:  [facility]  Date of Exam:   10/05/2023 Medical Rec #: [id number]           Accession #:    [id number] Date of Birth: January 17, 1946           Patient Gender: F Patient Age:   31 years Exam Location:  [facility] Procedure:       VAS [place] LOWER EXTREMITY VENOUS (DVT) Referring Phys: Tereasa Coop --------------------------------------------------------------------------------  Indications: Pulmonary  embolism.  Risk Factors: Confirmed PE. Anticoagulation: Heparin. Comparison Study: No prior studies. Performing Technologist: Chanda Busing RVT  Examination Guidelines: A complete evaluation includes B-mode imaging, spectral Doppler, color Doppler, and power Doppler as needed of all accessible portions of each vessel. Bilateral testing is considered an integral part of a complete examination. Limited examinations for reoccurring indications may be performed as noted. The reflux portion of the exam is performed with the patient in reverse Trendelenburg.  +---------+---------------+---------+-----------+----------+--------------+ RIGHT    CompressibilityPhasicitySpontaneityPropertiesThrombus Aging +---------+---------------+---------+-----------+----------+--------------+ CFV      Full           Yes      Yes                                 +---------+---------------+---------+-----------+----------+--------------+ SFJ      Full                                                        +---------+---------------+---------+-----------+----------+--------------+ FV Prox  Full                                                        +---------+---------------+---------+-----------+----------+--------------+ FV Mid   Full                                                        +---------+---------------+---------+-----------+----------+--------------+ FV DistalFull                                                        +---------+---------------+---------+-----------+----------+--------------+ PFV      Full                                                        +---------+---------------+---------+-----------+----------+--------------+ POP      Full           Yes      Yes                                  +---------+---------------+---------+-----------+----------+--------------+ PTV      Full                                                        +---------+---------------+---------+-----------+----------+--------------+ PERO     Full                                                        +---------+---------------+---------+-----------+----------+--------------+   +---------+---------------+---------+-----------+----------+--------------+  LEFT     CompressibilityPhasicitySpontaneityPropertiesThrombus Aging +---------+---------------+---------+-----------+----------+--------------+ CFV      Full           Yes      Yes                                 +---------+---------------+---------+-----------+----------+--------------+ SFJ      Full                                                        +---------+---------------+---------+-----------+----------+--------------+ FV Prox  Full                                                        +---------+---------------+---------+-----------+----------+--------------+ FV Mid   Full                                                        +---------+---------------+---------+-----------+----------+--------------+ FV DistalFull                                                        +---------+---------------+---------+-----------+----------+--------------+ PFV      Full                                                        +---------+---------------+---------+-----------+----------+--------------+ POP      Partial        Yes      Yes                  Acute          +---------+---------------+---------+-----------+----------+--------------+ PTV      Full                                                        +---------+---------------+---------+-----------+----------+--------------+ PERO     Full                                                         +---------+---------------+---------+-----------+----------+--------------+ Gastroc  Full                                                        +---------+---------------+---------+-----------+----------+--------------+  Summary: RIGHT: - There is no evidence of deep vein thrombosis in the lower extremity.  - No cystic structure found in the popliteal fossa.  LEFT: - Findings consistent with acute deep vein thrombosis involving the left popliteal vein.  - No cystic structure found in the popliteal fossa.  *See table(s) above for measurements and observations.    Preliminary    CT Angio Chest PE W/Cm &/Or Wo Cm Addendum Date: 10/05/2023 ADDENDUM REPORT: 10/05/2023 01:42 ADDENDUM: These results were called by telephone at the time of interpretation on 10/05/2023 at 1:42 am to provider [facility], who verbally acknowledged these results. Electronically Signed   By: Helyn Numbers M.D.   On: 10/05/2023 01:42   Result Date: 10/05/2023 CLINICAL DATA:  High probability pulmonary embolism, dyspnea on exertion EXAM: CT ANGIOGRAPHY CHEST WITH CONTRAST TECHNIQUE: Multidetector CT imaging of the chest was performed using the standard protocol during bolus administration of intravenous contrast. Multiplanar CT image reconstructions and MIPs were obtained to evaluate the vascular anatomy. RADIATION DOSE REDUCTION: This exam was performed according to the departmental dose-optimization program which includes automated exposure control, adjustment of the mA and/or kV according to patient size and/or use of iterative reconstruction technique. CONTRAST:  80mL OMNIPAQUE IOHEXOL 350 MG/ML SOLN COMPARISON:  None Available. FINDINGS: Cardiovascular: There is adequate opacification of the pulmonary arterial tree. There are multiple branching intraluminal filling defects identified with lobar and segmental pulmonary arteries of the lungs bilaterally keeping with acute pulmonary emboli. The polyp burden is moderate. The  central pulmonary arteries are of normal caliber. However, there is CT evidence of right heart strain inversion of the normal RV/LV ratio (RV/LV = 1.1). No significant coronary artery calcification. Global cardiac size within limits. No pericardial effusion. Mild atherosclerotic calcification within thoracic aorta. No aortic aneurysm. Mediastinum/Nodes: No enlarged mediastinal, hilar, or axillary lymph nodes. Thyroid gland, trachea, and esophagus demonstrate no significant findings. Lungs/Pleura: Lungs are clear. No pleural effusion or pneumothorax. Upper Abdomen: 2.4 cm low-attenuation lesion within the visualized liver is better assessed on prior CT examination of 10/04/2023 as compatible with a simple cyst. No acute abnormality within visualized upper abdomen. Musculoskeletal: Degenerative changes are seen within the thoracic spine. No acute bone abnormality. No lytic or blastic bone lesion. Review of the MIP images confirms the above findings. IMPRESSION: 1. Acute pulmonary emboli with moderate clot burden. CT evidence of right heart strain (RV/LV Ratio = 1.1 ) consistent with at least submassive (intermediate risk) PE. The presence of right heart strain has been associated with an increased risk of morbidity and mortality. Please refer to the "Code PE Focused" order set in EPIC. Aortic Atherosclerosis (ICD10-I70.0). Electronically Signed: By: Helyn Numbers M.D. On: 10/05/2023 01:28   DG Chest 2 View Result Date: 10/05/2023 CLINICAL DATA:  Dyspnea EXAM: CHEST - 2 VIEW COMPARISON:  None Available. FINDINGS: Lungs are well expanded, symmetric, and clear. No pneumothorax or pleural effusion. Cardiac size within normal limits. Pulmonary vascularity is normal. Osseous structures are age-appropriate. No acute bone abnormality. IMPRESSION: No active cardiopulmonary disease. Electronically Signed   By: Helyn Numbers M.D.   On: 10/05/2023 01:28   DG Chest 2 View Result Date: 10/04/2023 CLINICAL DATA:  Shortness  of breath EXAM: CHEST - 2 VIEW COMPARISON:  Chest radiograph 10/03/2023 FINDINGS: Normal cardiac and mediastinal contours. Aortic tortuosity. No large area pulmonary consolidation. No pleural effusion or pneumothorax. Thoracic spine degenerative changes. IMPRESSION: No active cardiopulmonary disease. Electronically Signed   By: Annia Belt M.D.   On: 10/04/2023  21:34   CT RENAL STONE STUDY Result Date: 10/04/2023 CLINICAL DATA:  Hematuria left-sided low back pain fatigue EXAM: CT ABDOMEN AND PELVIS WITHOUT CONTRAST TECHNIQUE: Multidetector CT imaging of the abdomen and pelvis was performed following the standard protocol without IV contrast. RADIATION DOSE REDUCTION: This exam was performed according to the departmental dose-optimization program which includes automated exposure control, adjustment of the mA and/or kV according to patient size and/or use of iterative reconstruction technique. COMPARISON:  None Available. FINDINGS: Lower chest: No acute abnormality. Hepatobiliary: Simple hepatic cyst along the anterior margin of the right lobe of the liver segment 8 2.7 x 2.0 cm in diameter. No other focal hepatic lesions. Prior cholecystectomy without biliary dilatation. Pancreas: Unremarkable. No pancreatic ductal dilatation or surrounding inflammatory changes. Spleen: Normal in size without focal abnormality. Adrenals/Urinary Tract: Adrenal glands are unremarkable. Kidneys are normal, without renal calculi, focal lesion, or hydronephrosis. Bladder is unremarkable. Visualized portions of the ureters and bladder unremarkable. Stomach/Bowel: Stomach is within normal limits. Appendix appears normal. No evidence of bowel wall thickening, distention, or inflammatory changes. 8 mm calcification posterior to the cecal appendix could correlate with peritoneal calcification of no clinical significance. Vascular/Lymphatic: Aortic atherosclerosis. No enlarged abdominal or pelvic lymph nodes. Reproductive: Status post  hysterectomy. No adnexal masses. Other: No abdominal wall hernia or abnormality. No abdominopelvic ascites. Multilevel degenerative disc disease lumbosacral spine with severe degenerative disc disease L4-5 and L5-S1 Musculoskeletal: IMPRESSION: *No acute abnormality of the abdomen or pelvis. *Simple hepatic cyst. *Prior cholecystectomy and hysterectomy. *Aortic atherosclerosis. 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 Electronically Signed   By: Shaaron Adler M.D.   On: 10/04/2023 14:27     Subjective: Patient seen examined bedside, lying in bed.  No complaints this morning.  Echocardiogram performed this morning with no acute/concerning findings.  Transition from heparin drip to Eliquis.  Discharging home.  Updated daughter via telephone.  Denies headache, no dizziness, no chest pain, no palpitations, no shortness of breath, no abdominal pain, no fever/chills/night sweats, no nausea/vomit/diarrhea, no focal weakness, no fatigue, no paresthesias.  No acute events overnight per nursing staff.  Discharge Exam: Vitals:   10/06/23 1200 10/06/23 1300  BP: (!) 130/58 (!) 131/48  Pulse: 69 72  Resp:    Temp:    SpO2: 97% 97%   Vitals:   10/06/23 1100 10/06/23 1146 10/06/23 1200 10/06/23 1300  BP: (!) 125/50  (!) 130/58 (!) 131/48  Pulse: 69  69 72  Resp:      Temp:  97.8 F (36.6 C)    TempSrc:  Oral    SpO2: 97%  97% 97%  Weight:      Height:        Physical Exam: GEN: NAD, alert and oriented x 3, wd/wn HEENT: NCAT, PERRL, EOMI, sclera clear, MMM PULM: CTAB w/o wheezes/crackles, normal respiratory effort, on room air CV: RRR w/o M/G/R GI: abd soft, NTND, NABS, no R/G/M MSK: no peripheral edema, muscle strength globally intact 5/5 bilateral upper/lower extremities NEURO: CN II-XII intact, no focal deficits, sensation to light touch intact PSYCH: normal mood/affect Integumentary: dry/intact, no rashes or wounds    The results of  significant diagnostics from this hospitalization (including imaging, microbiology, ancillary and laboratory) are listed below for reference.     Microbiology: Recent Results (from the past 240 hours)  Urine Culture     Status: None   Collection Time: 10/03/23  4:25 PM   Specimen: Blood  Result Value Ref Range Status  MICRO NUMBER: [id number]  Final   SPECIMEN QUALITY: Adequate  Final   Sample Source NOT GIVEN  Final   STATUS: FINAL  Final   Result: No Growth  Final  MRSA Next Gen by PCR, Nasal     Status: None   Collection Time: 10/05/23  1:47 PM   Specimen: Nasal Mucosa; Nasal Swab  Result Value Ref Range Status   MRSA by PCR Next Gen NOT DETECTED NOT DETECTED Final    Comment: (NOTE) The GeneXpert MRSA Assay (FDA approved for NASAL specimens only), is one component of a comprehensive MRSA colonization surveillance program. It is not intended to diagnose MRSA infection nor to guide or monitor treatment for MRSA infections. Test performance is not FDA approved in patients less than 44 years old. Performed at [facility], [address], [place], [place] [address]      Labs: BNP (last 3 results) Recent Labs    03/16/23 1640 10/04/23 2259  BNP 31.2 48.1   Basic Metabolic Panel: Recent Labs  Lab 10/03/23 1625 10/04/23 2259 10/04/23 2307 10/05/23 0212 10/05/23 0544 10/05/23 0825 10/05/23 1421  NA 144 145 149* 143 140 138 142  K 3.9 3.9 3.9  --  3.3*  --   --   CL 112 115* 114*  --  112*  --   --   CO2 25 25  --   --  21*  --   --   GLUCOSE 73 105* 101*  --  111*  --   --   BUN 14 16 15   --  15  --   --   CREATININE 0.91 0.83 0.90  --  0.71  --   --   CALCIUM 10.1 9.3  --   --  8.8*  --   --    Liver Function Tests: Recent Labs  Lab 10/03/23 1625 10/05/23 0544  AST 23 19  ALT 20 19  ALKPHOS 126* 103  BILITOT 0.4 0.4  PROT 7.4 6.4*  ALBUMIN 4.5 3.7   No results for input(s): "LIPASE", "AMYLASE" in the last 168 hours. No results for  input(s): "AMMONIA" in the last 168 hours. CBC: Recent Labs  Lab 10/03/23 1625 10/04/23 2259 10/04/23 2307 10/05/23 0544 10/05/23 1009 10/06/23 0237  WBC 8.3 9.2  --  8.9 7.8 9.1  NEUTROABS 4.7 5.1  --   --   --   --   HGB 12.5 11.7* 12.2 10.7* 10.6* 10.7*  HCT 39.8 39.4 36.0 35.8* 35.5* 35.6*  MCV 85.3 90.0  --  89.1 89.9 90.4  PLT 180.0 163  --  160 155 161   Cardiac Enzymes: No results for input(s): "CKTOTAL", "CKMB", "CKMBINDEX", "TROPONINI" in the last 168 hours. BNP: Invalid input(s): "POCBNP" CBG: No results for input(s): "GLUCAP" in the last 168 hours. D-Dimer No results for input(s): "DDIMER" in the last 72 hours. Hgb A1c No results for input(s): "HGBA1C" in the last 72 hours. Lipid Profile No results for input(s): "CHOL", "HDL", "LDLCALC", "TRIG", "CHOLHDL", "LDLDIRECT" in the last 72 hours. Thyroid function studies Recent Labs    10/03/23 1625  TSH 0.97   Anemia work up Recent Labs    10/03/23 1625  VITAMINB12 538  FERRITIN CANCELED  IRON CANCELED   Urinalysis    Component Value Date/Time   COLORURINE YELLOW 10/03/2023 1625   APPEARANCEUR CLEAR 10/03/2023 1625   LABSPEC <=1.005 (A) 10/03/2023 1625   PHURINE 7.0 10/03/2023 1625   GLUCOSEU NEGATIVE 10/03/2023 1625   HGBUR  TRACE-INTACT (A) 10/03/2023 1625   BILIRUBINUR NEGATIVE 10/03/2023 1625   BILIRUBINUR neg 10/03/2023 1514   KETONESUR NEGATIVE 10/03/2023 1625   PROTEINUR Negative 10/03/2023 1514   UROBILINOGEN 0.2 10/03/2023 1625   UROBILINOGEN 0.2 10/03/2023 1514   NITRITE NEGATIVE 10/03/2023 1625   NITRITE neg 10/03/2023 1514   LEUKOCYTESUR NEGATIVE 10/03/2023 1625   LEUKOCYTESUR Negative 10/03/2023 1514   Sepsis Labs Recent Labs  Lab 10/04/23 2259 10/05/23 0544 10/05/23 1009 10/06/23 0237  WBC 9.2 8.9 7.8 9.1   Microbiology Recent Results (from the past 240 hours)  Urine Culture     Status: None   Collection Time: 10/03/23  4:25 PM   Specimen: Blood  Result Value Ref Range  Status   MICRO NUMBER: [id number]  Final   SPECIMEN QUALITY: Adequate  Final   Sample Source NOT GIVEN  Final   STATUS: FINAL  Final   Result: No Growth  Final  MRSA Next Gen by PCR, Nasal     Status: None   Collection Time: 10/05/23  1:47 PM   Specimen: Nasal Mucosa; Nasal Swab  Result Value Ref Range Status   MRSA by PCR Next Gen NOT DETECTED NOT DETECTED Final    Comment: (NOTE) The GeneXpert MRSA Assay (FDA approved for NASAL specimens only), is one component of a comprehensive MRSA colonization surveillance program. It is not intended to diagnose MRSA infection nor to guide or monitor treatment for MRSA infections. Test performance is not FDA approved in patients less than 33 years old. Performed at [facility], [address] [place], [place] [address]      Time coordinating discharge: Over 30 minutes  SIGNED:   Alvira Philips [place], DO  [facility] 10/06/2023, 1:51 PM

## 2023-10-07 LAB — CARDIOLIPIN ANTIBODIES, IGG, IGM, IGA
Anticardiolipin IgA: <9 U/mL (ref 0–11)
Anticardiolipin IgG: <9 GPL U/mL (ref 0–14)
Anticardiolipin IgM: <9 [MPL'U]/mL (ref 0–12)

## 2023-10-07 LAB — BETA-2-GLYCOPROTEIN I ABS, IGG/M/A
Beta-2 Glyco I IgG: <9 GPI IgG units (ref 0–20)
Beta-2-Glycoprotein I IgA: <9 GPI IgA units (ref 0–25)
Beta-2-Glycoprotein I IgM: <9 GPI IgM units (ref 0–32)

## 2023-10-08 ENCOUNTER — Telehealth: Payer: Self-pay

## 2023-10-08 LAB — LUPUS ANTICOAGULANT PANEL
DRVVT: 41.6 s (ref 0.0–47.0)
PTT Lupus Anticoagulant: 39.1 s (ref 0.0–43.5)

## 2023-10-08 LAB — PROTEIN C ACTIVITY: Protein C Activity: 98 % (ref 73–180)

## 2023-10-08 LAB — PROTEIN S, TOTAL: Protein S Ag, Total: 86 % (ref 60–150)

## 2023-10-08 LAB — PROTEIN S ACTIVITY: Protein S Activity: 61 % — ABNORMAL LOW (ref 63–140)

## 2023-10-08 NOTE — Patient Outreach (Signed)
 Pt returning emmi call. Explained care management services.   [facility]  Population Health Care Management Assistant  Direct Dial: [phone]  Fax: [phone] Website: [email or web address]

## 2023-10-09 LAB — FACTOR 5 LEIDEN

## 2023-10-09 LAB — PROTHROMBIN GENE MUTATION

## 2023-10-10 ENCOUNTER — Ambulatory Visit (INDEPENDENT_AMBULATORY_CARE_PROVIDER_SITE_OTHER): Admitting: Family Medicine

## 2023-10-10 ENCOUNTER — Encounter: Payer: Self-pay | Admitting: Family Medicine

## 2023-10-10 VITALS — BP 112/84 | HR 65 | Temp 98.2°F | Ht 62.0 in | Wt 207.6 lb

## 2023-10-10 DIAGNOSIS — R0609 Other forms of dyspnea: Secondary | ICD-10-CM

## 2023-10-10 DIAGNOSIS — I10 Essential (primary) hypertension: Secondary | ICD-10-CM

## 2023-10-10 DIAGNOSIS — I5189 Other ill-defined heart diseases: Secondary | ICD-10-CM

## 2023-10-10 DIAGNOSIS — I2699 Other pulmonary embolism without acute cor pulmonale: Secondary | ICD-10-CM

## 2023-10-10 DIAGNOSIS — K7689 Other specified diseases of liver: Secondary | ICD-10-CM | POA: Diagnosis not present

## 2023-10-10 DIAGNOSIS — I82432 Acute embolism and thrombosis of left popliteal vein: Secondary | ICD-10-CM

## 2023-10-10 DIAGNOSIS — M5137 Other intervertebral disc degeneration, lumbosacral region with discogenic back pain only: Secondary | ICD-10-CM

## 2023-10-10 DIAGNOSIS — R4189 Other symptoms and signs involving cognitive functions and awareness: Secondary | ICD-10-CM | POA: Diagnosis not present

## 2023-10-10 MED ORDER — APIXABAN 5 MG PO TABS: 5.0000 mg | ORAL_TABLET | Freq: Two times a day (BID) | ORAL | 1 refills | Status: DC

## 2023-10-10 NOTE — Patient Instructions (Addendum)
 The protein S activity level was low on labs done at the hospital.  I will look to see if there were any other labs related to blood clotting that were pending.  Given the family history of blood clots in several family member's it is probably worth staying on Eliquis.  We can readdress this in the next 4 months.

## 2023-10-10 NOTE — Progress Notes (Signed)
 Established Patient Office Visit   Subjective  Patient ID: Gina Key, female    DOB: Jan 06, 1946  Age: 78 y.o. MRN: [id number]  No chief complaint on file.   Pt 78 year old female seen for HFU.  Patient hospitalized for SOB 2/2 PE and DVT.  Pt became increasingly short of breath and fatigued at home after last OFV.  Patient's daughter called EMS.  In ED CTA chest with acute pulmonary emboli with moderate clot burden, evidence of right heart strain on CT.  Heparin drip started in ED.  Doppler ultrasound positive for DVT in left popliteal vein.  TTE with LVEF 60-65%, no LV regional wall motion abnormalities, mild asymmetric left ventricular hypertrophy of the basal septal segment.  RV systolic function within normal limits, grade 1 diastolic dysfunction, IVC normal.  Pt transition to Eliquis.  Aspirin 325 mg held.  Pt still feeling fatigued, SOB, and having occasional headaches.  Also feeling cold.  Patient notes a burning sensation of the medial left lower leg x 2 months prior but did not mention to anyone per her twin sister.  Doing okay at home on Eliquis.  Mentions her sister and a niece had history of blood clots.   Patient Active Problem List   Diagnosis Date Noted   Acute pulmonary embolism (HCC) 10/05/2023   Hypernatremia 10/05/2023   Dyspnea on exertion 10/05/2023   Osteopenia 12/14/2022   Essential hypertension 09/28/2021   Gastroesophageal reflux disease 09/28/2021   Seasonal allergies 09/28/2021   History of migraine 09/28/2021   Urinary incontinence 09/28/2021   Hyperlipidemia 09/28/2021   History of seizure 09/28/2021   OSA (obstructive sleep apnea) 09/28/2021   Past Medical History:  Diagnosis Date   Acid reflux    Allergy    Anxiety    DDD (degenerative disc disease), lumbar    Depression    Facet joint disease    GERD (gastroesophageal reflux disease)    High cholesterol    Hypertension    Low vitamin D level    OSA (obstructive sleep apnea)     Primary osteoarthritis involving multiple joints    Seizures (HCC)    Sleep apnea    C PAP   Past Surgical History:  Procedure Laterality Date   BREAST BIOPSY Right    benign   CHOLECYSTECTOMY     left and right rotator cuff     Social History   Tobacco Use   Smoking status: Never   Smokeless tobacco: Never  Vaping Use   Vaping status: Never Used  Substance Use Topics   Alcohol use: Never   Drug use: Never   Family History  Problem Relation Age of Onset   Pancreatic cancer Mother    Thyroid cancer Sister    Colon polyps Daughter    Kidney cancer Daughter    Sleep apnea Daughter    Breast cancer Maternal Aunt 64   Seizures Paternal Grandmother    Cancer Brother        tongue   Sleep apnea Son    Stroke Son    Seizures Son    Sleep apnea Son    Seizures Grandson    Colon cancer Neg Hx    Crohn's disease Neg Hx    Esophageal cancer Neg Hx    Rectal cancer Neg Hx    Stomach cancer Neg Hx    Ulcerative colitis Neg Hx    Dementia Neg Hx    No Known Allergies    ROS Negative unless stated  above    Objective:     BP 112/84 (BP Location: Left Arm, Patient Position: Sitting, Cuff Size: Normal)   Pulse 65   Temp 98.2 F (36.8 C) (Oral)   Ht 5\' 2"  (1.575 m)   Wt 207 lb 9.6 oz (94.2 kg)   SpO2 98%   BMI 37.97 kg/m  BP Readings from Last 3 Encounters:  10/10/23 112/84  10/06/23 (!) 131/48  10/03/23 130/72   Wt Readings from Last 3 Encounters:  10/10/23 207 lb 9.6 oz (94.2 kg)  10/05/23 203 lb 4.2 oz (92.2 kg)  10/03/23 203 lb 12.8 oz (92.4 kg)    Physical Exam Constitutional:      General: She is not in acute distress.    Appearance: Normal appearance.  HENT:     Head: Normocephalic and atraumatic.     Nose: Nose normal.     Mouth/Throat:     Mouth: Mucous membranes are moist.  Cardiovascular:     Rate and Rhythm: Normal rate and regular rhythm.     Heart sounds: Normal heart sounds. No murmur heard.    No gallop.     Comments: Edema of left  ankle Pulmonary:     Effort: Pulmonary effort is normal. No respiratory distress.     Breath sounds: Normal breath sounds. No wheezing, rhonchi or rales.  Musculoskeletal:     Left lower leg: Edema present.  Skin:    General: Skin is warm and dry.  Neurological:     Mental Status: She is alert and oriented to person, place, and time.     No results found for any visits on 10/10/23.    Assessment & Plan:  Other acute pulmonary embolism without acute cor pulmonale (HCC) -     Apixaban; Take 1 tablet (5 mg total) by mouth 2 (two) times daily.  Dispense: 180 tablet; Refill: 1  Acute deep vein thrombosis (DVT) of popliteal vein of left lower extremity (HCC) -     Apixaban; Take 1 tablet (5 mg total) by mouth 2 (two) times daily.  Dispense: 180 tablet; Refill: 1  Essential hypertension  Dyspnea on exertion  Grade I diastolic dysfunction -Echo 10/06/2023.  EF 60-65%, mild asymmetric left ventricular hypertrophy of the basal-septal segment.  Left ventricular diastolic parameters consistent with grade 1 diastolic dysfunction.  Cognitive changes  Patient seen for hospital follow-up.  Notes, imaging, labs reviewed.  CTA chest 10/04/2023 with acute pulmonary emboli with moderate clot burden.  CT evidence of right heart strain consistent with at least submassive PE.  Continue Eliquis starter pack.  A new prescription for Eliquis with refills sent to pharmacy for patient to start when done with starter pack.  Discussed treatment x 4-6 months at a minimum.  Discussed likely duration of medication, fatigue and soreness in leg.  BP stable.  Tachycardia resolved.  Cognitive changes stable.   Return in about 3 months (around 01/10/2024).   Deeann Saint, MD

## 2023-10-12 ENCOUNTER — Encounter: Payer: Medicare HMO | Admitting: Physical Medicine & Rehabilitation

## 2023-10-13 ENCOUNTER — Other Ambulatory Visit: Payer: Self-pay | Admitting: Family Medicine

## 2023-10-13 ENCOUNTER — Other Ambulatory Visit: Payer: Self-pay | Admitting: Neurology

## 2023-10-13 DIAGNOSIS — K219 Gastro-esophageal reflux disease without esophagitis: Secondary | ICD-10-CM

## 2023-10-15 DIAGNOSIS — M51379 Other intervertebral disc degeneration, lumbosacral region without mention of lumbar back pain or lower extremity pain: Secondary | ICD-10-CM | POA: Insufficient documentation

## 2023-10-15 DIAGNOSIS — I700 Atherosclerosis of aorta: Secondary | ICD-10-CM | POA: Insufficient documentation

## 2023-10-15 DIAGNOSIS — K7689 Other specified diseases of liver: Secondary | ICD-10-CM | POA: Insufficient documentation

## 2023-10-27 ENCOUNTER — Other Ambulatory Visit: Payer: Self-pay | Admitting: Family Medicine

## 2023-11-20 ENCOUNTER — Encounter: Payer: Self-pay | Admitting: Family Medicine

## 2023-11-20 ENCOUNTER — Telehealth: Payer: Self-pay

## 2023-11-20 NOTE — Telephone Encounter (Signed)
 Copied from CRM [id number]. Topic: Clinical - Prescription Issue >> Nov 20, 2023  9:09 AM Deaijah H wrote: Reason for CRM: Patient daughter betty called in stating patient received a letter stating there's a Recall on atorvastatin  (LIPITOR) 40 MG tablet. Would like to know if patient should keep taking medication or will Dr. Arliss Lam write a prescription for something new. Please call [phone]

## 2023-11-28 ENCOUNTER — Telehealth: Payer: Self-pay

## 2023-11-28 ENCOUNTER — Other Ambulatory Visit: Payer: Self-pay | Admitting: Family Medicine

## 2023-11-28 DIAGNOSIS — Z8669 Personal history of other diseases of the nervous system and sense organs: Secondary | ICD-10-CM

## 2023-11-28 DIAGNOSIS — J302 Other seasonal allergic rhinitis: Secondary | ICD-10-CM

## 2023-11-28 DIAGNOSIS — I10 Essential (primary) hypertension: Secondary | ICD-10-CM

## 2023-11-28 NOTE — Telephone Encounter (Signed)
 Copied from CRM [id number]. Topic: Clinical - Medication Question >> Nov 28, 2023  2:31 PM Shereese L wrote: Reason for CRM: Ardeen Beath  called in and stated that the patient received a letter from [facility] saying that atorvastatin  (LIPITOR) 40 MG tablet is on recall and needs to know what steps to take. Please call [phone]

## 2023-11-29 NOTE — Telephone Encounter (Signed)
 Called patient spoke with Daughter Ninette Basque Per DPR, patient is aware, they will speak with the pharmacy and will make a decision on the change and give us  a call back.

## 2023-11-29 NOTE — Telephone Encounter (Signed)
 Pharmacy may have a new supply of medication that was not affected by the recall.  If not we can send in a prescription for another statin that is equivalent to the Lipitor 40 mg.

## 2023-12-04 ENCOUNTER — Ambulatory Visit (INDEPENDENT_AMBULATORY_CARE_PROVIDER_SITE_OTHER): Payer: Medicare HMO | Admitting: Neurology

## 2023-12-04 ENCOUNTER — Encounter: Payer: Self-pay | Admitting: Neurology

## 2023-12-04 VITALS — BP 136/74 | HR 93 | Ht 62.0 in | Wt 207.7 lb

## 2023-12-04 DIAGNOSIS — G3184 Mild cognitive impairment, so stated: Secondary | ICD-10-CM

## 2023-12-04 DIAGNOSIS — Z87898 Personal history of other specified conditions: Secondary | ICD-10-CM | POA: Diagnosis not present

## 2023-12-04 DIAGNOSIS — G4733 Obstructive sleep apnea (adult) (pediatric): Secondary | ICD-10-CM | POA: Diagnosis not present

## 2023-12-04 MED ORDER — LEVETIRACETAM 500 MG PO TABS
500.0000 mg | ORAL_TABLET | Freq: Two times a day (BID) | ORAL | 3 refills | Status: AC
Start: 2023-12-04 — End: ?

## 2023-12-04 NOTE — Patient Instructions (Signed)
 Continue Keppra  for seizure prevention.  Try to turn down the humidity on your CPAP machine to see if less water accumulates.  Continue to troubleshoot with your DME.  Recommend good management of vascular risk factors, exercise, brain stimulating activities for the memory.  Follow-up in 6 months.  Thanks

## 2023-12-04 NOTE — Progress Notes (Signed)
 Patient: Gina Key Date of Birth: May 03, 1946  Reason for Visit: Follow up History from: Patient Primary Neurologist: Gina Key  ASSESSMENT AND PLAN 78 y.o. year old female   1.  History of seizure like activity on Keppra  last was 2023 2.  OSA, not currently using CPAP 3.  Mild cognitive impairment 4.  Recent pulmonary embolism, on Eliquis   - Willing to restart CPAP, I am suspicious that her humidity settings may be too high causing water to accumulate in the tubing, advised her to turn her humidity down, continue to troubleshoot with DME about CPAP. When she restarts CPAP let me know, I can pull download - Continue Keppra  500 mg twice a day -MMSE 21/30, does not desire any memory medications, will monitor over time.  Discussed the importance of management of vascular risk factors, exercise, brain stimulating activity - Follow-up in 6 months for CPAP revisit  Meds ordered this encounter  Medications   levETIRAcetam  (KEPPRA ) 500 MG tablet    Sig: Take 1 tablet (500 mg total) by mouth 2 (two) times daily.    Dispense:  180 tablet    Refill:  3   HISTORY OF PRESENT ILLNESS: Today 12/04/23 Saw Gina Key September 2024, for memory loss.  Was not using her CPAP machine at the time.  No new memory medication started.  Maintained on Keppra  for seizure prevention. Does not want to use CPAP anymore, claims it smothers her. Had PE in March, since then decline in mobility. Claims got new CPAP machine in August 2023. Has full face mask. Claims water in the tubing, she is afraid of water. Doesn't sleep well, toss and turn at night. Often has morning headache. Feels memory has declined some, is forgetful, poor short term. Her daughter lives with her for 2 years. Misplace things, see something on facebook think she knows them. Does her own ADLs. Gina Key manages medication. Uses cane. She is not concerned about her memory. Is on Eliquis . No seizures, remains on Keppra  500 mg twice daily. Claims has  taken CPAP machine to DME twice and settings changed.  HISTORY  Gina Key is a 78 year old female with an underlying medical history of reflux disease, allergies, history of migraines, history of seizure-like activity (on Keppra ), hyperlipidemia, arthritis, hypertension, OSA on PAP therapy and obesity, who presents for a new problem visit of memory loss.  The patient is accompanied by her daughter today.  She has followed in this clinic for sleep apnea as well as seizure concern in the past.     She saw Gina Millet, NP in a virtual visit on 10/17/2022.   Today, 04/23/2023: She reports not having any major issues with her memory, sometimes she is forgetful.  Her daughter reports that she has herself not noticed much in the way of problem with her mom's memory but that patient's younger sister thought there was some concern about her forgetfulness and sisters husband has dementia so she mentioned it to them.  Patient reports that she continues to take Keppra  without any side effects, she is also on Topamax  for headaches.  She has not had any major medication changes.  Daughter reports that sometimes her balance is not good, she has not fallen recently.  She admits that she does not always hydrate well but has become better with her water intake.  She lives with daughter, daughter's husband and daughters 2 boys as well as daughter's grandchild.  She does not drink any alcohol.  She is a non-smoker. She saw  her PCP, Gina Key on 03/05/2023, I reviewed the office visit note.  Her MoCA score was 13 at the time.     She had a head CT without contrast on 03/16/2023 and I reviewed the results:    IMPRESSION: 1. No evidence of acute intracranial abnormality. 2. Mild chronic small vessel ischemic disease.     She had blood work on 03/05/2023 and I reviewed the results in her epic chart.  Vitamin B12 was on the lower end of normal at 355, folate normal at 8.2, CMP showed benign findings with sodium of 144, potassium  3.9, BUN 13, creatinine 0.9, alk phos 117 which was borderline normal on the higher end, AST 19, ALT 14, CBC with differential and platelets benign without any evidence of anemia.  TSH normal at 1.07, free T4 normal at 0.64, vitamin D  on the lower end of normal at 41.57.       She admits that she has not been using her AutoPap machine.  Her latest 90-day download shows no usage since 02/03/2023.  She was quite good with her compliance in the month of June through mid July.  She reports that it feels like she is smothering from the air.  She uses a fullface mask.  She is agreeable to restarting treatment with a modified pressure, new supplies and also with humidity reduction as she has had instances of water accumulating in the mask.  REVIEW OF SYSTEMS: Out of a complete 14 system review of symptoms, the patient complains only of the following symptoms, and all other reviewed systems are negative.  See HPI  ALLERGIES: No Known Allergies  HOME MEDICATIONS: Outpatient Medications Prior to Visit  Medication Sig Dispense Refill   apixaban  (ELIQUIS ) 5 MG TABS tablet Take 1 tablet (5 mg total) by mouth 2 (two) times daily. 180 tablet 1   APIXABAN  (ELIQUIS ) VTE STARTER PACK (10MG  AND 5MG ) Take as directed on package: start with two-5mg  tablets twice daily for 7 days. On day 8, switch to one-5mg  tablet twice daily. 74 each 0   atorvastatin  (LIPITOR) 40 MG tablet TAKE 1 TABLET BY MOUTH DAILY. SCHEDULE ANNUAL VISIT FOR FUTURE REFILLS 90 tablet 1   Incontinence Supply Disposable (WINGS HL ADULT BRIEFS/XL) MISC As needed daily. 100 each 11   ipratropium (ATROVENT ) 0.06 % nasal spray PLACE 2 SPRAYS INTO BOTH NOSTRILS 4 TIMES DAILY. 15 mL 1   levETIRAcetam  (KEPPRA ) 500 MG tablet TAKE 1 TABLET BY MOUTH TWICE A DAY 180 tablet 3   lidocaine  (HM LIDOCAINE  PATCH) 4 % Place 1 patch onto the skin daily as needed.     losartan  (COZAAR ) 100 MG tablet TAKE 1 TABLET BY MOUTH EVERY DAY 90 tablet 1   metoprolol  succinate  (TOPROL -XL) 25 MG 24 hr tablet TAKE 1 TABLET (25 MG TOTAL) BY MOUTH DAILY. 90 tablet 1   montelukast  (SINGULAIR ) 10 MG tablet TAKE 1 TABLET BY MOUTH EVERYDAY AT BEDTIME 90 tablet 1   oxyCODONE  (ROXICODONE ) 5 MG immediate release tablet Take 1-1.5 tablets (5-7.5 mg total) by mouth every 12 (twelve) hours as needed. 90 tablet 0   pantoprazole  (PROTONIX ) 20 MG tablet TAKE 1 TABLET BY MOUTH EVERY DAY 90 tablet 1   potassium chloride  (KLOR-CON ) 10 MEQ tablet TAKE 1 TABLET BY MOUTH EVERY DAY 90 tablet 0   spironolactone  (ALDACTONE ) 25 MG tablet TAKE 1 TABLET (25 MG TOTAL) BY MOUTH DAILY. 90 tablet 1   topiramate  (TOPAMAX ) 100 MG tablet TAKE 1 TABLET BY MOUTH TWICE A DAY  180 tablet 1   aspirin 325 MG EC tablet Take 325 mg by mouth daily. (Patient not taking: Reported on 12/04/2023)     Facility-Administered Medications Prior to Visit  Medication Dose Route Frequency Provider Last Rate Last Admin   lidocaine  (XYLOCAINE ) 1 % (with pres) injection 1 mL  1 mL Other Once        triamcinolone  acetonide (KENALOG -40) injection 40 mg  40 mg Intramuscular Once         PAST MEDICAL HISTORY: Past Medical History:  Diagnosis Date   Acid reflux    Allergy    Anxiety    DDD (degenerative disc disease), lumbar    Depression    Facet joint disease    GERD (gastroesophageal reflux disease)    High cholesterol    Hypertension    Low vitamin D  level    OSA (obstructive sleep apnea)    Primary osteoarthritis involving multiple joints    Seizures (HCC)    Sleep apnea    C PAP    PAST SURGICAL HISTORY: Past Surgical History:  Procedure Laterality Date   BREAST BIOPSY Right    benign   CHOLECYSTECTOMY     left and right rotator cuff      FAMILY HISTORY: Family History  Problem Relation Age of Onset   Pancreatic cancer Mother    Thyroid  cancer Sister    Colon polyps Daughter    Kidney cancer Daughter    Sleep apnea Daughter    Breast cancer Maternal Aunt 47   Seizures Paternal Grandmother     Cancer Brother        tongue   Sleep apnea Son    Stroke Son    Seizures Son    Sleep apnea Son    Seizures Grandson    Colon cancer Neg Hx    Crohn's disease Neg Hx    Esophageal cancer Neg Hx    Rectal cancer Neg Hx    Stomach cancer Neg Hx    Ulcerative colitis Neg Hx    Dementia Neg Hx     SOCIAL HISTORY: Social History   Socioeconomic History   Marital status: Widowed    Spouse name: Not on file   Number of children: 6   Years of education: 57   Highest education level: Not on file  Occupational History   Not on file  Tobacco Use   Smoking status: Never   Smokeless tobacco: Never  Vaping Use   Vaping status: Never Used  Substance and Sexual Activity   Alcohol use: Never   Drug use: Never   Sexual activity: Not on file  Other Topics Concern   Not on file  Social History Narrative   Lives at home with daughter   Right handed   Caffeine: none    Social Drivers of Corporate investment banker Strain: Low Risk  (02/16/2023)   Overall Financial Resource Strain (CARDIA)    Difficulty of Paying Living Expenses: Not hard at all  Food Insecurity: No Food Insecurity (10/05/2023)   Hunger Vital Sign    Worried About Running Out of Food in the Last Year: Never true    Ran Out of Food in the Last Year: Never true  Transportation Needs: No Transportation Needs (10/05/2023)   PRAPARE - Administrator, Civil Service (Medical): No    Lack of Transportation (Non-Medical): No  Physical Activity: Inactive (02/16/2023)   Exercise Vital Sign    Days of Exercise per Week:  0 days    Minutes of Exercise per Session: 0 min  Stress: No Stress Concern Present (02/16/2023)   [employer] of Occupational Health - Occupational Stress Questionnaire    Feeling of Stress : Not at all  Social Connections: Moderately Integrated (10/05/2023)   Social Connection and Isolation Panel [NHANES]    Frequency of Communication with Friends and Family: More than three times a week     Frequency of Social Gatherings with Friends and Family: More than three times a week    Attends Religious Services: More than 4 times per year    Active Member of [employer] or Organizations: Yes    Attends Banker Meetings: More than 4 times per year    Marital Status: Widowed  Intimate Partner Violence: Not At Risk (10/05/2023)   Humiliation, Afraid, Rape, and Kick questionnaire    Fear of Current or Ex-Partner: No    Emotionally Abused: No    Physically Abused: No    Sexually Abused: No    PHYSICAL EXAM  Vitals:   12/04/23 1506  BP: 136/74  Pulse: 93  Weight: 207 lb 10.8 oz (94.2 kg)  Height: 5\' 2"  (1.575 m)   Body mass index is 37.98 kg/m.    12/04/2023    4:01 PM  MMSE - Mini Mental State Exam  Orientation to time 3  Orientation to Place 4  Registration 3  Attention/ Calculation 5  Recall 0  Language- name 2 objects 2  Language- repeat 0  Language- follow 3 step command 2  Language- read & follow direction 1  Write a sentence 1  Copy design 0  Total score 21   Generalized: Well developed, in no acute distress  Neurological examination  Mentation: Alert oriented to time, place, history taking. Follows all commands speech and language fluent Cranial nerve II-XII: Pupils were equal round reactive to light. Extraocular movements were full, visual field were full on confrontational test. Facial sensation and strength were normal. Head turning and shoulder shrug  were normal and symmetric. Motor: The motor testing reveals 5 over 5 strength of all 4 extremities. Good symmetric motor tone is noted throughout.  Sensory: Sensory testing is intact to soft touch on all 4 extremities. No evidence of extinction is noted.  Coordination: Cerebellar testing reveals good finger-nose-finger and heel-to-shin bilaterally.  Gait and station: Seated in wheelchair, gait not tested but has quad cane, only in wheelchair due to long distance walking DIAGNOSTIC DATA (LABS, IMAGING,  TESTING) - I reviewed patient records, labs, notes, testing and imaging myself where available.  Lab Results  Component Value Date   WBC 9.1 10/06/2023   HGB 10.7 (L) 10/06/2023   HCT 35.6 (L) 10/06/2023   MCV 90.4 10/06/2023   PLT 161 10/06/2023      Component Value Date/Time   NA 142 10/05/2023 1421   K 3.3 (L) 10/05/2023 0544   CL 112 (H) 10/05/2023 0544   CO2 21 (L) 10/05/2023 0544   GLUCOSE 111 (H) 10/05/2023 0544   BUN 15 10/05/2023 0544   CREATININE 0.71 10/05/2023 0544   CALCIUM  8.8 (L) 10/05/2023 0544   PROT 6.4 (L) 10/05/2023 0544   ALBUMIN 3.7 10/05/2023 0544   AST 19 10/05/2023 0544   ALT 19 10/05/2023 0544   ALKPHOS 103 10/05/2023 0544   BILITOT 0.4 10/05/2023 0544   GFRNONAA >60 10/05/2023 0544   Lab Results  Component Value Date   CHOL 131 12/07/2022   HDL 48.50 12/07/2022   LDLCALC 74  12/07/2022   TRIG 44.0 12/07/2022   CHOLHDL 3 12/07/2022   Lab Results  Component Value Date   HGBA1C 5.9 12/07/2022   Lab Results  Component Value Date   VITAMINB12 538 10/03/2023   Lab Results  Component Value Date   TSH 0.97 10/03/2023    Gina Key, AGNP-C, DNP 12/04/2023, 3:19 PM [facility] [address], [address] [place], [place] [address] [phone]

## 2023-12-07 ENCOUNTER — Other Ambulatory Visit: Payer: Self-pay | Admitting: Family Medicine

## 2023-12-07 ENCOUNTER — Other Ambulatory Visit: Payer: Self-pay

## 2023-12-07 DIAGNOSIS — I700 Atherosclerosis of aorta: Secondary | ICD-10-CM

## 2023-12-07 DIAGNOSIS — E785 Hyperlipidemia, unspecified: Secondary | ICD-10-CM

## 2023-12-07 MED ORDER — ROSUVASTATIN CALCIUM 20 MG PO TABS: 20.0000 mg | ORAL_TABLET | Freq: Every day | ORAL | 3 refills | Status: AC

## 2023-12-07 MED ORDER — PRAVASTATIN SODIUM 20 MG PO TABS: 20.0000 mg | ORAL_TABLET | Freq: Every day | ORAL | 3 refills | Status: DC

## 2023-12-07 NOTE — Telephone Encounter (Signed)
 Per Dr. Arliss Lam patient is to change Lipitor 40 mg to Pravastatin 20 mg. Spoke with Ninette Basque per DPR, patient is aware , and medication has been sent to the pharmacy.

## 2023-12-14 ENCOUNTER — Encounter: Payer: Self-pay | Admitting: Physical Medicine & Rehabilitation

## 2023-12-14 ENCOUNTER — Encounter: Attending: Physical Medicine & Rehabilitation | Admitting: Physical Medicine & Rehabilitation

## 2023-12-14 VITALS — BP 125/80 | HR 111 | Ht 62.0 in | Wt 209.8 lb

## 2023-12-14 DIAGNOSIS — M170 Bilateral primary osteoarthritis of knee: Secondary | ICD-10-CM

## 2023-12-14 DIAGNOSIS — Z79899 Other long term (current) drug therapy: Secondary | ICD-10-CM

## 2023-12-14 DIAGNOSIS — M19011 Primary osteoarthritis, right shoulder: Secondary | ICD-10-CM | POA: Diagnosis not present

## 2023-12-14 MED ORDER — LIDOCAINE HCL 1 % IJ SOLN: 4.0000 mL | Freq: Once | INTRAMUSCULAR | Status: AC

## 2023-12-14 MED ORDER — TRIAMCINOLONE ACETONIDE 40 MG/ML IJ SUSP: 40.0000 mg | Freq: Once | INTRAMUSCULAR | Status: AC

## 2023-12-14 NOTE — Progress Notes (Signed)
 Subjective:    Patient ID: Gina Key, female    DOB: 1946/03/31, 78 y.o.   MRN: [id number]  HPI      HPI   Gina Key is a 78 y.o. year old female  who  has a past medical history of Acid reflux, Allergy, Anxiety, DDD (degenerative disc disease), lumbar, Depression, Facet joint disease, GERD (gastroesophageal reflux disease), High cholesterol, Hypertension, Low vitamin D  level, OSA (obstructive sleep apnea), Primary osteoarthritis involving multiple joints, Seizures (HCC), and Sleep apnea.   They are presenting to PM&R clinic as a new patient for pain management evaluation. They were referred by Wells Halim, PA for treatment of chronic pain.  Patient reports her primary pain is in her right shoulder.  Pain is worse with activity and use of her shoulder.  She cannot lie on her right side due to this pain.  Pain is sharp, aching, stabbing and overall constant.   She was seen by Dr. Rozelle Corning on 04/06/2023 and had a ultrasound-guided shoulder injection with methylprednisolone .  She reports this did help her pain for short time but pain has returned.  Patient was not interested in shoulder surgery or other joint surgery at this time.   She also has some pain in her right hip and left knee.  She feels like this is due to her altered gait.  She uses a cane and walker for ambulation.     She reports poor sleep at night.  Denies depression.   She is previously followed by [facility] for pain management and was treated with buprenorphine 10 mcg every 7 days and Oxycodone  10mg  as needed.   It appears by PDMP review that buprenorphine was discontinued and she was continued on Oxycodone  10mg  by Minor Amble until Feb 2023.  Patient reports she moved to [facility] at this time and had not established with a different pain clinic.  She reports the oxycodone  was very helpful for her pain and allowed her to be much more active and use her arm for activities.  She denies any  side effects with the medication.     Red flag symptoms: No red flags for back pain endorsed in Hx or ROS   Medications tried: lidocaine  patch help  Topical medications  Nsaids Advil doesn't work Tylenol    didn't help  Opiates  Hydrocodone- didn't help much  Tramadol- stopped helping  Gabapentin / Lyrica  Denies  TCAs  Denies  SNRIs- Denies      Other treatments: PT- Did not help  TENs unit- Denies  Injections- Cortisone injections- dont help  Surgery   Rotator cuff sugery many years ago     Interval history 06/11/2023 Patient is here for follow-up regarding her chronic right shoulder, right hip and bilateral knee pain.  She report she continues to have severe pain in her right shoulder worsened with any kind of movement.  Today she feels that her right knee has been causing her more pain than her left knee.  She does not recall her right hip being a significant source of pain recently, however she has a family member with her who reports that patient continues to complain of right hip pain worsened with ambulation.  She has tried duloxetine  but has not noticed a significant change in her pain levels.    Interval history 07/23/23 Patient left knee pain is greater than she had constant severe pain in her right shoulder.  She also has a pain in her  right hip and both knees.  Today she reports left knee pain has been worse than right knee pain.  Patient has tried oxycodone  5 mg, this provides mild benefit but has not been strong enough to keep her pain controlled.  She has occasionally had constipation- chronic issue, had recent bowel movement after use of laxative.  She does not use regular stool softeners.  Interval history 09/03/23 Gina Key is here for a left knee injection.  She continues to have pain in her right shoulder, left knee greater than right knee, right hip.  She reports poor benefit with oxycodone  5 to 7.5 mg dose.  She tries to use the medication sparingly.  She says  pain was much more tolerable when she used 10 mg.  Denies any side effects with medication.   Interval history 12/14/23 Gina Key is here for right shoulder injection.  She continues to have right shoulder pain with ROM.  She reports her left knee is doing better since injection was completed last visit.  She uses oxycodone  as needed when pain is particularly severe.  She tries to use this medication sparingly only when pain is very severe and it was last filled in January.  When she does use medication she will take 2 tabs, she reports poor benefit when just taking 1 tab.  No side effects with the medication.  Prior UDS results: No results found for: "LABOPIA", "COCAINSCRNUR", "LABBENZ", "AMPHETMU", "THCU", "LABBARB"    Pain Inventory Average Pain 8 Pain Right Now 8 My pain is constant, sharp, burning, stabbing, tingling, and aching  In the last 24 hours, has pain interfered with the following? General activity 8 Relation with others 8 Enjoyment of life 8 What TIME of day is your pain at its worst? morning , daytime, evening, and night Sleep (in general) Fair  Pain is worse with: some activites Pain improves with: nothing Relief from Meds: 4    Family History  Problem Relation Age of Onset   Pancreatic cancer Mother    Thyroid  cancer Sister    Colon polyps Daughter    Kidney cancer Daughter    Sleep apnea Daughter    Breast cancer Maternal Aunt 69   Seizures Paternal Grandmother    Cancer Brother        tongue   Sleep apnea Son    Stroke Son    Seizures Son    Sleep apnea Son    Seizures Grandson    Colon cancer Neg Hx    Crohn's disease Neg Hx    Esophageal cancer Neg Hx    Rectal cancer Neg Hx    Stomach cancer Neg Hx    Ulcerative colitis Neg Hx    Dementia Neg Hx    Social History   Socioeconomic History   Marital status: Widowed    Spouse name: Not on file   Number of children: 6   Years of education: 28   Highest education level: Not on file   Occupational History   Not on file  Tobacco Use   Smoking status: Never   Smokeless tobacco: Never  Vaping Use   Vaping status: Never Used  Substance and Sexual Activity   Alcohol use: Never   Drug use: Never   Sexual activity: Not on file  Other Topics Concern   Not on file  Social History Narrative   Lives at home with daughter   Right handed   Caffeine: none    Social Drivers of Health  Financial Resource Strain: Low Risk  (02/16/2023)   Overall Financial Resource Strain (CARDIA)    Difficulty of Paying Living Expenses: Not hard at all  Food Insecurity: No Food Insecurity (10/05/2023)   Hunger Vital Sign    Worried About Running Out of Food in the Last Year: Never true    Ran Out of Food in the Last Year: Never true  Transportation Needs: No Transportation Needs (10/05/2023)   PRAPARE - Administrator, Civil Service (Medical): No    Lack of Transportation (Non-Medical): No  Physical Activity: Inactive (02/16/2023)   Exercise Vital Sign    Days of Exercise per Week: 0 days    Minutes of Exercise per Session: 0 min  Stress: No Stress Concern Present (02/16/2023)   [employer] of Occupational Health - Occupational Stress Questionnaire    Feeling of Stress : Not at all  Social Connections: Moderately Integrated (10/05/2023)   Social Connection and Isolation Panel [NHANES]    Frequency of Communication with Friends and Family: More than three times a week    Frequency of Social Gatherings with Friends and Family: More than three times a week    Attends Religious Services: More than 4 times per year    Active Member of [employer] or Organizations: Yes    Attends Banker Meetings: More than 4 times per year    Marital Status: Widowed   Past Surgical History:  Procedure Laterality Date   BREAST BIOPSY Right    benign   CHOLECYSTECTOMY     left and right rotator cuff     Past Medical History:  Diagnosis Date   Acid reflux    Allergy    Anxiety     DDD (degenerative disc disease), lumbar    Depression    Facet joint disease    GERD (gastroesophageal reflux disease)    High cholesterol    Hypertension    Low vitamin D  level    OSA (obstructive sleep apnea)    Primary osteoarthritis involving multiple joints    Seizures (HCC)    Sleep apnea    C PAP   There were no vitals taken for this visit.  Opioid Risk Score:   Fall Risk Score:  `1  Depression screen PHQ 2/9     09/27/2023    4:21 PM 09/03/2023    3:26 PM 07/23/2023    3:00 PM 06/11/2023   10:01 AM 05/18/2023   10:03 AM 05/04/2023    8:15 AM 03/05/2023    3:30 PM  Depression screen PHQ 2/9  Decreased Interest 0 0 0 0 0 0 0  Down, Depressed, Hopeless 0 0 0 0 0 0 0  PHQ - 2 Score 0 0 0 0 0 0 0  Altered sleeping 3    0 0 0  Tired, decreased energy 2    0 0 0  Change in appetite 1    0 0 0  Feeling bad or failure about yourself  0    0 0 0  Trouble concentrating 0    0 0 0  Moving slowly or fidgety/restless 0    0 0 0  Suicidal thoughts 0    0 0 0  PHQ-9 Score 6    0 0 0  Difficult doing work/chores Not difficult at all     Not difficult at all Not difficult at all    Review of Systems  Constitutional: Negative.   HENT: Negative.    Respiratory:  Positive for apnea.   Cardiovascular: Negative.   Gastrointestinal: Negative.        Liquid stools - Gallbladder removed years ago  Endocrine: Negative.   Genitourinary: Negative.   Musculoskeletal:  Positive for gait problem.       Get off balance & out of breath / right knee and right shoulder  Skin: Negative.   Allergic/Immunologic: Negative.   Hematological: Negative.   Psychiatric/Behavioral: Negative.    All other systems reviewed and are negative.      Objective:   Physical Exam   Gen: no distress, normal appearing HEENT: oral mucosa pink and moist, NCAT Chest: normal effort, normal rate of breathing Abd: soft, non-distended Ext: no edema Psych: pleasant, normal affect Skin: intact Neuro:  Alert and awake, follows simple commands, hard of hearing  RUE: 4-/5 Deltoid-limited by pain, 5/5 Biceps, 5/5 Triceps,  5/5 Grip LUE: 5/5 Deltoid, 5/5 Biceps, 5/5 Triceps, 5/5 Grip RLE: HF 5/5, KE 5/5, KF 5/5, ADF 5/5, APF 5/5 LLE: HF 5/5, KE 5/5, HF, 5/5, ADF 5/5, APF 5/5 Sensory exam intact for light touch and pain in all 4 limbs.   Musculoskeletal:  Right shoulder painful with passive ROM in all directions   Shoulder R xray 03/16/23 The transscapular and axillary views are limited. Significant degenerative change in the glenohumeral joint with osteophytes off the inferior glenoid and medial humeral head. AC joint degenerative change. No fracture. No evidence of dislocation. No other bony or soft tissue abnormalities identified.   IMPRESSION: Degenerative changes as above. No fracture or dislocation identified.    C spine CT 12/11/21 Alignment: Normal.   Skull base and vertebrae: No acute fracture. No primary bone lesion or focal pathologic process.   Soft tissues and spinal canal: No prevertebral fluid or swelling. No visible canal hematoma.   Disc levels: Multilevel degenerative disc disease/spondylosis and RIGHT facet arthropathy noted. The following findings are noted:   C2-3: A small central disc protrusion is noted with moderate RIGHT bony foraminal narrowing   C3-4: Mild central spinal and moderate RIGHT bony foraminal narrowing   C4-5: Moderate central spinal and mild to moderate RIGHT bony foraminal narrowing   C5-C6: With broad-based disc bulge, mild to moderate central spinal and moderate bony biforaminal narrowing   C6-7: With broad-based disc bulge, mild central spinal, mild RIGHT and moderate LEFT bony foraminal narrowing   Upper chest: No acute abnormality   Other: None   IMPRESSION: 1. No evidence of acute intracranial abnormality. 2. No static evidence of acute injury to the cervical spine. Degenerative changes as described.        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  Continue PDMP monitoring.  Pain contract completed prior visit. - Adjust oxycodone  to 10 mg twice daily as needed, she never takes 5 mg does not provide much benefit, advised to call about 1 week before the refill needed.  Will plan to order #30  tabs of 10 mg -Continue duloxetine  20mg  daily for MSK pain, consider trying higher dose at future visit  -Family is monitoring her medication use and only giving her medication when she is reporting significant pain-I think this is a good idea to continue -Xray B/L Knees completed - OA noted  -Colace daily for stool softener discussed, miralax if stronger medication  needed - Left knee injection completed last visit with benefit - Right shoulder injection today  Right shoulder injection   Indication:Right Shoulder pain not relieved by medication management and other conservative care.   Informed consent was obtained after describing risks and benefits of the procedure with the patient, this includes bleeding, bruising, infection and medication side effects. The patient wishes to proceed and has given written consent. Patient was placed in a seated position. The Right shoulder was marked and prepped with betadine in the subacromial area. A 25-gauge 1-1/2 inch needle was inserted into the subacromial area. After negative draw back for blood, a solution containing 1 mL of 6 mg per ML betamethasone and 4 mL of 1% lidocaine  was injected. A band aid was applied. The patient tolerated the procedure well. Post procedure instructions were given.  Aaron Aas

## 2023-12-20 IMAGING — CT CT HEAD W/O CM
2 series · 15 of 30 positions shown, 17 images · non-contrast
Comparison: None Available.

CLINICAL DATA: 76-year-old female with altered mental status and
fall.



[Series 3: head bone · axial · 0.40mm/px · z∈[-121,+1]mm · 8 of 77 slices shown]
[im 8/77  bone]
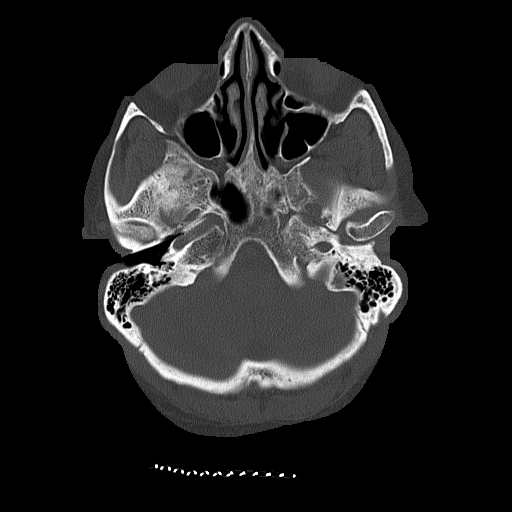
[im 16/77  bone]
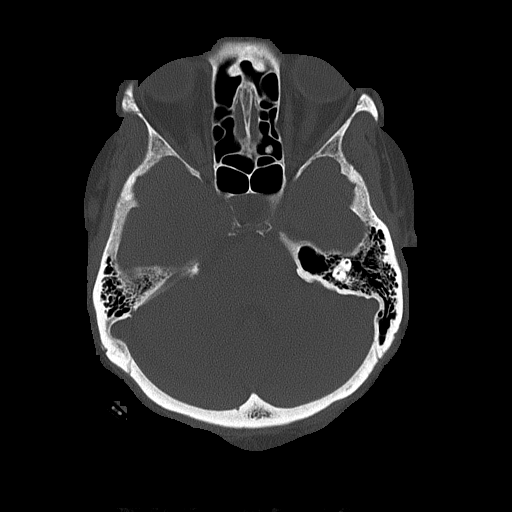
[im 23/77  bone]
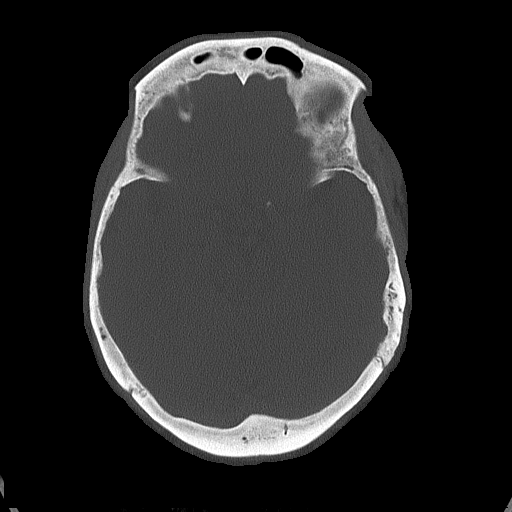
[im 35/77  bone]
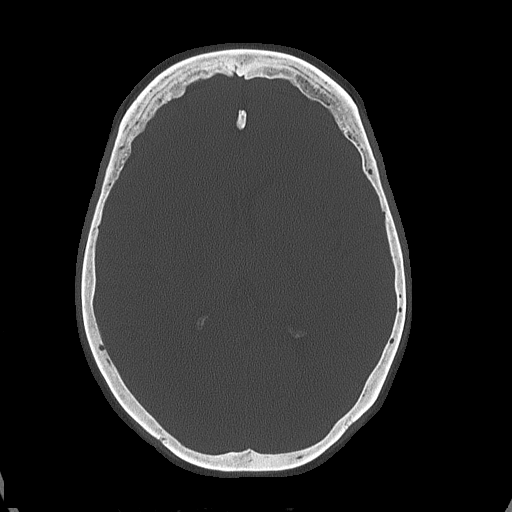
[im 42/77  bone]
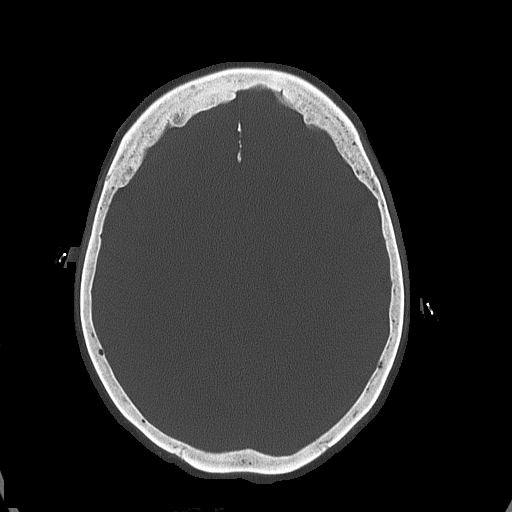
[im 54/77  bone]
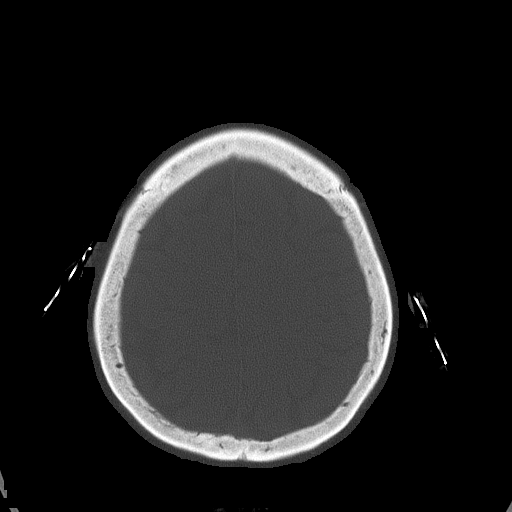
[im 61/77  bone]
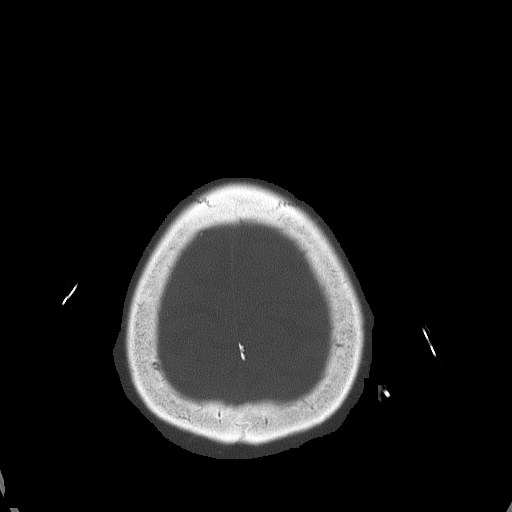
[im 69/77  bone]
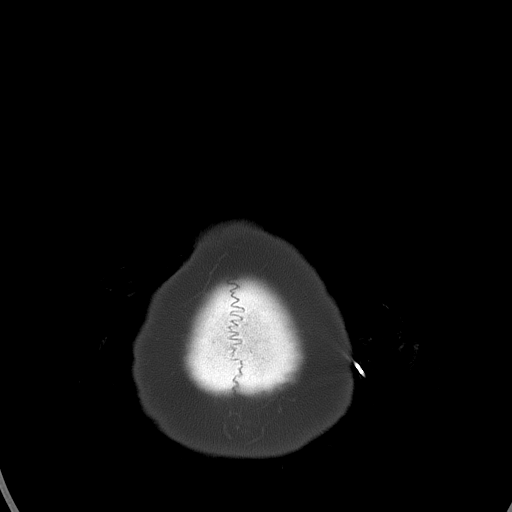

[Series 4: head without · axial · non-contrast · 0.40mm/px · z∈[-120,-5]mm · 7 of 31 slices shown, 9 images]
[im 4/31  brain]
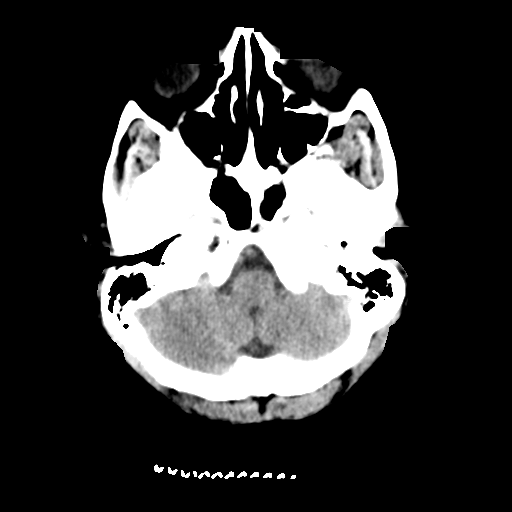
[im 4/31  bone]
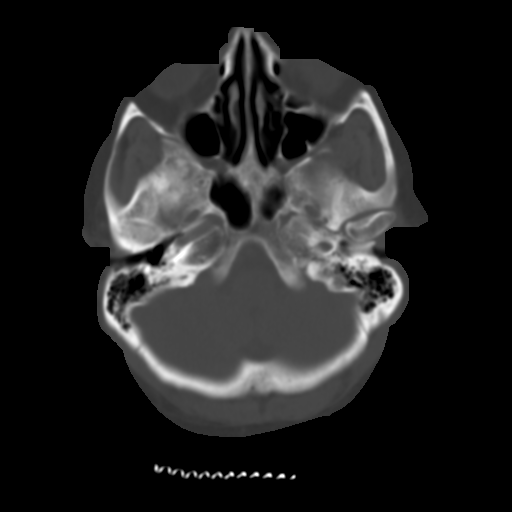
[im 8/31  brain]
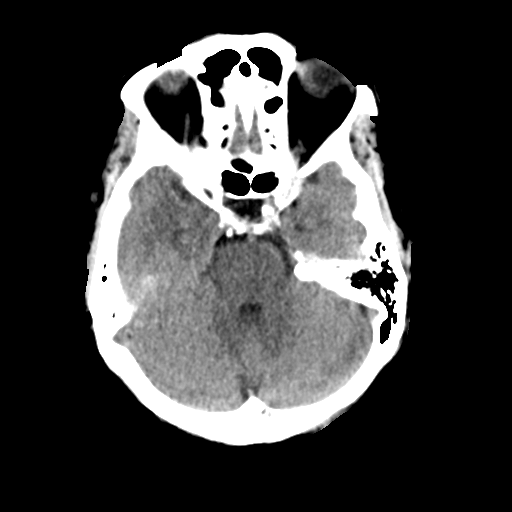
[im 12/31  brain]
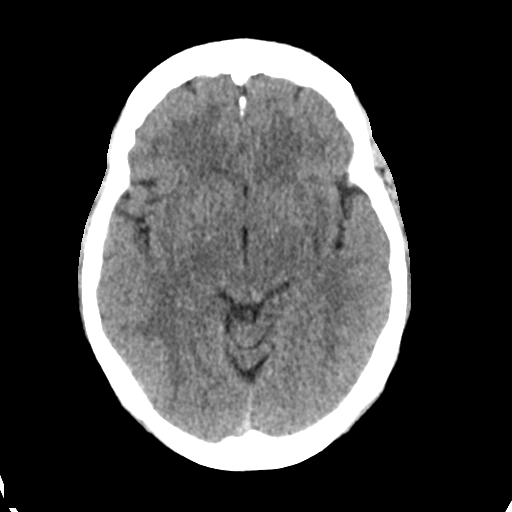
[im 16/31  brain]
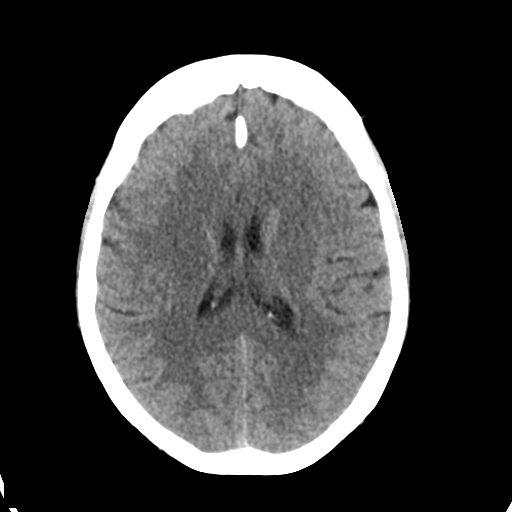
[im 19/31  brain]
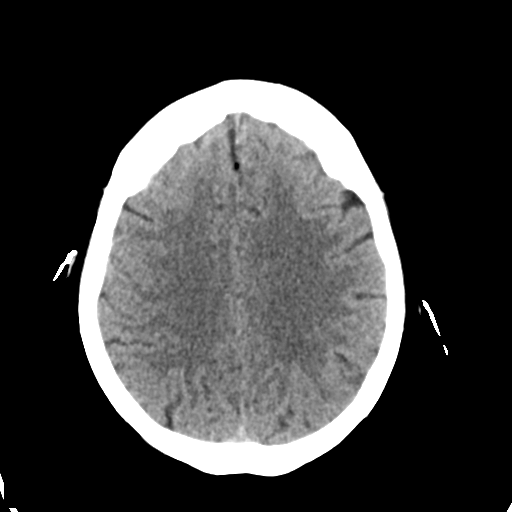
[im 19/31  bone]
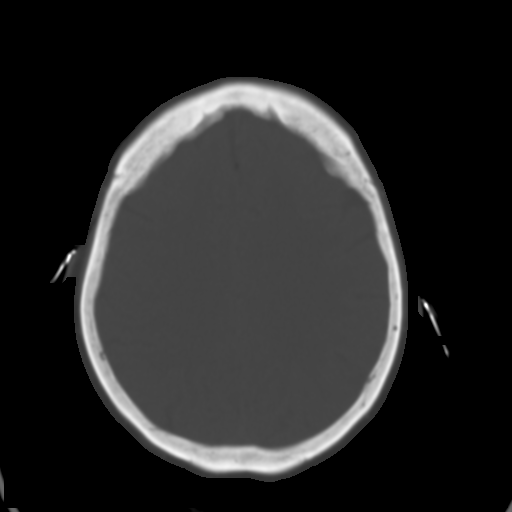
[im 23/31  brain]
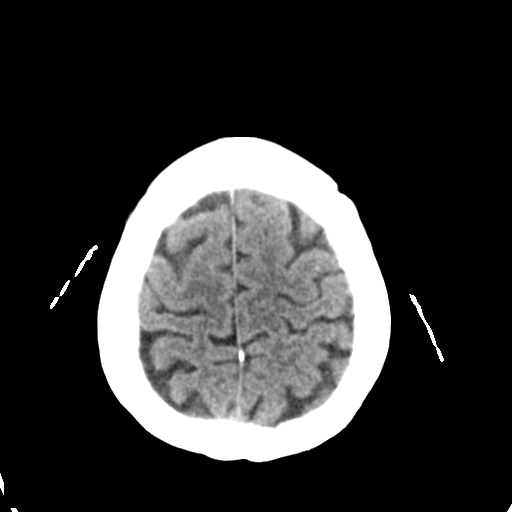
[im 27/31  brain]
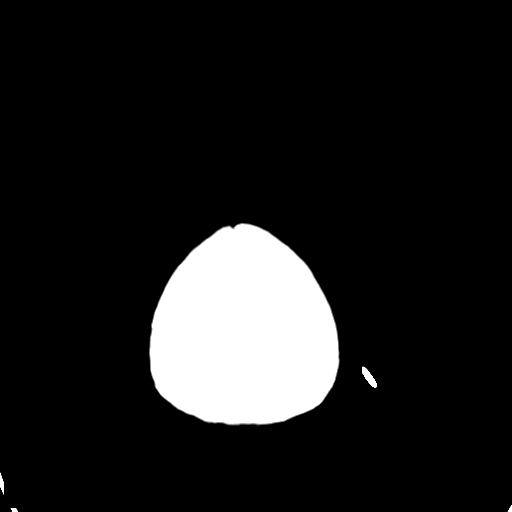

[15 of 30 positions shown; findings below may reference images not displayed]

FINDINGS: CT HEAD FINDINGS

Brain: No evidence of acute infarction, hemorrhage, hydrocephalus,
extra-axial collection or mass lesion/mass effect.

Vascular: Carotid atherosclerotic calcifications are noted.

Skull: Normal. Negative for fracture or focal lesion.

Sinuses/Orbits: No acute finding.

Other: None.

CT CERVICAL SPINE FINDINGS

Alignment: Normal.

Skull base and vertebrae: No acute fracture. No primary bone lesion
or focal pathologic process.

Soft tissues and spinal canal: No prevertebral fluid or swelling. No
visible canal hematoma.

Disc levels: Multilevel degenerative disc disease/spondylosis and
RIGHT facet arthropathy noted. The following findings are noted:

C2-3: A small central disc protrusion is noted with moderate RIGHT
bony foraminal narrowing

C3-4: Mild central spinal and moderate RIGHT bony foraminal
narrowing

C4-5: Moderate central spinal and mild to moderate RIGHT bony
foraminal narrowing

C5-C6: With broad-based disc bulge, mild to moderate central spinal
and moderate bony biforaminal narrowing

C6-7: With broad-based disc bulge, mild central spinal, mild RIGHT
and moderate LEFT bony foraminal narrowing

Upper chest: No acute abnormality

Other: None
IMPRESSION: 1. No evidence of acute intracranial abnormality.
2. No static evidence of acute injury to the cervical spine.
Degenerative changes as described.

## 2023-12-28 ENCOUNTER — Encounter: Payer: Self-pay | Admitting: Family Medicine

## 2023-12-28 ENCOUNTER — Ambulatory Visit (INDEPENDENT_AMBULATORY_CARE_PROVIDER_SITE_OTHER): Admitting: Family Medicine

## 2023-12-28 VITALS — BP 112/84 | HR 69 | Temp 98.4°F | Ht 62.0 in | Wt 207.4 lb

## 2023-12-28 DIAGNOSIS — M170 Bilateral primary osteoarthritis of knee: Secondary | ICD-10-CM

## 2023-12-28 DIAGNOSIS — R350 Frequency of micturition: Secondary | ICD-10-CM | POA: Diagnosis not present

## 2023-12-28 DIAGNOSIS — G3184 Mild cognitive impairment, so stated: Secondary | ICD-10-CM

## 2023-12-28 DIAGNOSIS — M19011 Primary osteoarthritis, right shoulder: Secondary | ICD-10-CM | POA: Diagnosis not present

## 2023-12-28 DIAGNOSIS — I2699 Other pulmonary embolism without acute cor pulmonale: Secondary | ICD-10-CM | POA: Diagnosis not present

## 2023-12-28 DIAGNOSIS — R300 Dysuria: Secondary | ICD-10-CM

## 2023-12-28 LAB — POCT URINALYSIS DIPSTICK
Bilirubin, UA: NEGATIVE
Blood, UA: POSITIVE
Glucose, UA: NEGATIVE
Ketones, UA: NEGATIVE
Leukocytes, UA: NEGATIVE
Nitrite, UA: NEGATIVE
Protein, UA: NEGATIVE
Spec Grav, UA: 1.015 (ref 1.010–1.025)
Urobilinogen, UA: 0.2 U/dL
pH, UA: 6 (ref 5.0–8.0)

## 2023-12-28 NOTE — Progress Notes (Signed)
 Established Patient Office Visit   Subjective  Patient ID: Gina Key, female    DOB: August 20, 1945  Age: 78 y.o. MRN: [id number]  Chief Complaint  Patient presents with   Medical Management of Chronic Issues    Bladder issues, frequency urination, patient is having right knee pain and right shoulder pain and swelling, patient rates the pain a 100 out of 10, patient is unable to sleep at night, and would like a new referral to ortho.     Patient is a 78 year old female seen for follow-up and acute issues.  Patient taking Eliquis  due to recent DVT/PE.  Patient denies SOB, cough, LE edema, CP, leg pain, blood in stools, dizziness.  Patient did fall out of bed several weeks ago.  States had bruising on right hip and flank that has just resolved.  At last OFV patient mentioned having 2 family members that had history of DVT.  No cause or recurrence noted.  Patient mentions right shoulder pain and knee pain.  Previously seen by Ortho but would like to see a different provider.  Also seen by pain management for injections.  Patient states injections with pain management provider were better tolerated than with Ortho.  Patient initially states she would like to have a referral to a new Ortho provider to discuss surgery due to pain.  Patient later changes her mind.  Memory issues increasing per patient's daughter.  Had recent follow-up with neurology.  MMSE 21/30.  Patient denies issues with memory.    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   Aortic atherosclerosis (HCC) 10/15/2023   Disc disease, degenerative, lumbar or lumbosacral 10/15/2023   Acute pulmonary embolism (HCC) 10/05/2023   Hypernatremia 10/05/2023   Dyspnea on exertion 10/05/2023   Osteopenia 12/14/2022   Essential hypertension 09/28/2021   Gastroesophageal reflux disease 09/28/2021   Seasonal allergies 09/28/2021   History of migraine  09/28/2021   Urinary incontinence 09/28/2021   Hyperlipidemia 09/28/2021   History of seizure 09/28/2021   OSA (obstructive sleep apnea) 09/28/2021   Past Medical History:  Diagnosis Date   Acid reflux    Allergy    Anxiety    DDD (degenerative disc disease), lumbar    Depression    Facet joint disease    GERD (gastroesophageal reflux disease)    High cholesterol    Hypertension    Low vitamin D  level    OSA (obstructive sleep apnea)    Primary osteoarthritis involving multiple joints    Seizures (HCC)    Sleep apnea    C PAP   Past Surgical History:  Procedure Laterality Date   BREAST BIOPSY Right    benign   CHOLECYSTECTOMY     left and right rotator cuff     Social History   Tobacco Use   Smoking status: Never   Smokeless tobacco: Never  Vaping Use   Vaping status: Never Used  Substance Use Topics   Alcohol use: Never   Drug use: Never   Family History  Problem Relation Age of Onset   Pancreatic cancer Mother    Thyroid  cancer Sister    Colon polyps Daughter    Kidney cancer Daughter    Sleep apnea Daughter    Breast cancer Maternal Aunt 65   Seizures Paternal Grandmother    Cancer Brother        tongue   Sleep apnea Son  Stroke Son    Seizures Son    Sleep apnea Son    Seizures Grandson    Colon cancer Neg Hx    Crohn's disease Neg Hx    Esophageal cancer Neg Hx    Rectal cancer Neg Hx    Stomach cancer Neg Hx    Ulcerative colitis Neg Hx    Dementia Neg Hx    No Known Allergies  ROS Negative unless stated above    Objective:      BP 112/84 (BP Location: Left Arm, Patient Position: Sitting, Cuff Size: Large)   Pulse 69   Temp 98.4 F (36.9 C) (Oral)   Ht 5\' 2"  (1.575 m)   Wt 207 lb 6.4 oz (94.1 kg)   SpO2 98%   BMI 37.93 kg/m  BP Readings from Last 3 Encounters:  12/28/23 112/84  12/14/23 125/80  12/04/23 136/74   Wt Readings from Last 3 Encounters:  12/28/23 207 lb 6.4 oz (94.1 kg)  12/14/23 209 lb 12.8 oz (95.2 kg)   12/04/23 207 lb 10.8 oz (94.2 kg)      Physical Exam Constitutional:      General: She is not in acute distress.    Appearance: Normal appearance.  HENT:     Head: Normocephalic and atraumatic.     Nose: Nose normal.     Mouth/Throat:     Mouth: Mucous membranes are moist.  Cardiovascular:     Rate and Rhythm: Normal rate and regular rhythm.     Heart sounds: Normal heart sounds. No murmur heard.    No gallop.  Pulmonary:     Effort: Pulmonary effort is normal. No respiratory distress.     Breath sounds: Normal breath sounds. No wheezing, rhonchi or rales.  Musculoskeletal:     Comments: Decreased ROM active/passive of right shoulder to 90 degrees.  No TTP.  Skin:    General: Skin is warm and dry.  Neurological:     Mental Status: She is alert and oriented to person, place, and time.        09/27/2023    4:21 PM 09/03/2023    3:26 PM 07/23/2023    3:00 PM  Depression screen PHQ 2/9  Decreased Interest 0 0 0  Down, Depressed, Hopeless 0 0 0  PHQ - 2 Score 0 0 0  Altered sleeping 3    Tired, decreased energy 2    Change in appetite 1    Feeling bad or failure about yourself  0    Trouble concentrating 0    Moving slowly or fidgety/restless 0    Suicidal thoughts 0    PHQ-9 Score 6    Difficult doing work/chores Not difficult at all        09/27/2023    4:22 PM 05/04/2023    8:16 AM 03/05/2023    3:31 PM 12/07/2022    8:15 AM  GAD 7 : Generalized Anxiety Score  Nervous, Anxious, on Edge 0 0 0 0  Control/stop worrying 0 0 0 0  Worry too much - different things 0 0 0 0  Trouble relaxing 0 0 0 1  Restless 0 0 0 0  Easily annoyed or irritable 0 0 0 0  Afraid - awful might happen 0 0 0 0  Total GAD 7 Score 0 0 0 1  Anxiety Difficulty Not difficult at all Not difficult at all  Not difficult at all     Results for orders placed or performed in visit on  12/28/23  POC Urinalysis Dipstick  Result Value Ref Range   Color, UA yellow    Clarity, UA clear    Glucose,  UA Negative Negative   Bilirubin, UA neg    Ketones, UA neg    Spec Grav, UA 1.015 1.010 - 1.025   Blood, UA pos    pH, UA 6.0 5.0 - 8.0   Protein, UA Negative Negative   Urobilinogen, UA 0.2 0.2 or 1.0 E.U./dL   Nitrite, UA neg    Leukocytes, UA Negative Negative   Appearance     Odor        Assessment & Plan:   Urinary frequency -     POCT urinalysis dipstick -     Urine Culture; Future -     Urinalysis, Routine w reflex microscopic; Future  Mild cognitive impairment  Primary osteoarthritis of both knees  Primary osteoarthritis of right shoulder  Other acute pulmonary embolism without acute cor pulmonale (HCC)  Patient with urinary frequency.  POC UA with RBCs, otherwise negative.  Obtain micro and UCX.  Further recommendations based on results.  Mild cognitive impairment continued.  Recent MMSE 21/30.  Offered medication.  Patient declines.  Continue maximizing medical management of reversible factors such as BP and cholesterol.  Continue follow-up with neurology.  Patient with history of OA seen on prior imaging.  X-ray 06/27/2023 with mild to moderate tricompartmental OA of bilateral knees.  X-ray 12/07/2022 right shoulder with significant degenerative change and glenohumeral joint with osteophytes and AC joint degenerative change.  Discussed supportive care with topical Voltaren gel 3 times daily as needed.  Continue steroid injections as needed.  Advised may not be a candidate for surgery given cognitive impairment.  Offered referral to Ortho, patient initially agrees, then expresses she wishes to wait.  Continue Eliquis  for history of PE with moderate clot burden and DVT in left popliteal vein on 10/04/2023.  Initially discussed continuing Eliquis  for at least 4-6 months.  Had discussion today regarding r/b/a of continuing medication indefinitely versus stopping in a few months.  Discussion ongoing as pt has a mostly sedentary lifestyle.  Also at risk increased risk of  bleeding given fall hx.    Return in about 3 months (around 03/29/2024).   Viola Greulich, MD

## 2023-12-31 DIAGNOSIS — R300 Dysuria: Secondary | ICD-10-CM | POA: Diagnosis not present

## 2024-01-01 ENCOUNTER — Ambulatory Visit: Payer: Self-pay | Admitting: Family Medicine

## 2024-01-01 DIAGNOSIS — R319 Hematuria, unspecified: Secondary | ICD-10-CM

## 2024-01-01 LAB — URINALYSIS, ROUTINE W REFLEX MICROSCOPIC
Bacteria, UA: NONE SEEN /HPF
Bilirubin Urine: NEGATIVE
Glucose, UA: NEGATIVE
Hyaline Cast: NONE SEEN /LPF
Ketones, ur: NEGATIVE
Leukocytes,Ua: NEGATIVE
Nitrite: NEGATIVE
Protein, ur: NEGATIVE
RBC / HPF: NONE SEEN /HPF (ref 0–2)
Specific Gravity, Urine: 1.012 (ref 1.001–1.035)
Squamous Epithelial / HPF: NONE SEEN /HPF (ref <=5)
WBC, UA: NONE SEEN /HPF (ref 0–5)
pH: 6.5 (ref 5.0–8.0)

## 2024-01-01 LAB — URINE CULTURE
MICRO NUMBER:: 16555499
Result:: NO GROWTH
SPECIMEN QUALITY:: ADEQUATE

## 2024-01-10 ENCOUNTER — Ambulatory Visit: Admitting: Family Medicine

## 2024-01-18 ENCOUNTER — Telehealth: Payer: Self-pay | Admitting: Physical Medicine & Rehabilitation

## 2024-01-18 MED ORDER — OXYCODONE HCL 10 MG PO TABS: 10.0000 mg | ORAL_TABLET | Freq: Two times a day (BID) | ORAL | 0 refills | Status: DC | PRN

## 2024-01-18 NOTE — Telephone Encounter (Signed)
 Patient needs refill on Oxycodone --[facility] on [address].

## 2024-01-28 ENCOUNTER — Ambulatory Visit: Admitting: Family Medicine

## 2024-02-04 ENCOUNTER — Ambulatory Visit: Payer: Self-pay

## 2024-02-04 ENCOUNTER — Ambulatory Visit (HOSPITAL_COMMUNITY)
Admission: RE | Admit: 2024-02-04 | Discharge: 2024-02-04 | Disposition: A | Discharge status: Home / Self Care | Source: Ambulatory Visit | Attending: Nurse Practitioner | Admitting: Nurse Practitioner

## 2024-02-04 ENCOUNTER — Other Ambulatory Visit (HOSPITAL_COMMUNITY)

## 2024-02-04 ENCOUNTER — Encounter (HOSPITAL_COMMUNITY): Payer: Self-pay | Admitting: *Deleted

## 2024-02-04 ENCOUNTER — Other Ambulatory Visit (HOSPITAL_COMMUNITY): Payer: Self-pay | Admitting: Radiology

## 2024-02-04 ENCOUNTER — Ambulatory Visit (HOSPITAL_COMMUNITY): Admission: EM | Admit: 2024-02-04 | Discharge: 2024-02-04 | Disposition: A | Discharge status: Home / Self Care

## 2024-02-04 ENCOUNTER — Encounter: Payer: Self-pay | Admitting: Family Medicine

## 2024-02-04 ENCOUNTER — Other Ambulatory Visit: Payer: Self-pay

## 2024-02-04 ENCOUNTER — Ambulatory Visit (HOSPITAL_COMMUNITY): Payer: Self-pay | Admitting: Nurse Practitioner

## 2024-02-04 DIAGNOSIS — N200 Calculus of kidney: Secondary | ICD-10-CM | POA: Diagnosis not present

## 2024-02-04 DIAGNOSIS — R319 Hematuria, unspecified: Secondary | ICD-10-CM | POA: Diagnosis not present

## 2024-02-04 HISTORY — DX: Other pulmonary embolism without acute cor pulmonale: I26.99

## 2024-02-04 LAB — POCT URINALYSIS DIP (MANUAL ENTRY)
Bilirubin, UA: NEGATIVE
Glucose, UA: NEGATIVE mg/dL
Ketones, POC UA: NEGATIVE mg/dL
Leukocytes, UA: NEGATIVE
Nitrite, UA: NEGATIVE
Protein Ur, POC: NEGATIVE mg/dL
Spec Grav, UA: 1.01 (ref 1.010–1.025)
Urobilinogen, UA: 0.2 U/dL
pH, UA: 6.5 (ref 5.0–8.0)

## 2024-02-04 NOTE — Telephone Encounter (Signed)
 FYI Only or Action Required?: Action required by provider: update on patient condition.  Patient was last seen in primary care on 12/28/2023 by Gina Clotilda SAUNDERS, MD.  Called Nurse Triage reporting Advice Only.  Symptoms began today.  Interventions attempted: Rest, hydration, or home remedies.  Symptoms are: unchanged.  Triage Disposition: Call PCP Now-HP message being sent to office.   Patient/caregiver understands and will follow disposition?: Yes  Copied from CRM [id number]. Topic: Clinical - Red Word Triage >> Feb 04, 2024 12:26 PM Gina Key wrote: Red Word that prompted transfer to Nurse Triage: Following NT Visit + Advice to go to UC: Patient is on eliquis  and has found blood in urine. Urgent care advised to follow up with her PCP in regards to her being on eliquis . No bacteria found in urine at urgent care. Reason for Disposition  [1] Follow-up call from patient regarding patient's clinical status AND [2] information urgent  Answer Assessment - Initial Assessment Questions 1. REASON FOR CALL or QUESTION: What is your reason for calling today? or How can I best     Patient was triaged earlier today for blood in urine. Patient was seen in Urgent Care-advised to follow up with her PCP in regards to being on eliquis . Patient was scheduled for a pelvic ultrasound by Urgent Care provider at 1:30 PM today. Daughter is wanting a follow up call from provider. 2. CALLER: Document the source of call. (e.g., laboratory staff, caregiver or patient).     Patient's daughter.  Protocols used: PCP Call - No Triage-A-AH

## 2024-02-04 NOTE — Telephone Encounter (Signed)
 FYI Only or Action Required?: Action required by provider: request for appointment.  Patient was last seen in primary care on 12/28/2023 by [name], MD.  Called Nurse Triage reporting Hematuria.  Symptoms began yesterday.  Interventions attempted: Rest, hydration, or home remedies.  Symptoms are: gradually improving.  Triage Disposition: See HCP Within 4 Hours (Or PCP Triage)  Patient/caregiver understands and will follow disposition?: No, wishes to speak with PCP           Copied from CRM [id number]. Topic: Clinical - Red Word Triage >> Feb 04, 2024  8:47 AM Cleave MATSU wrote: Red Word that prompted transfer to Nurse Triage: blood in urine lastnight and this morning. Reason for Disposition  Taking Coumadin (warfarin) or other strong blood thinner, or known bleeding disorder (e.g., thrombocytopenia)  Answer Assessment - Initial Assessment Questions 1. COLOR of URINE: Describe the color of the urine.  (e.g., tea-colored, pink, red, bloody) Do you have blood clots in your urine? (e.g., none, pea, grape, small coin)     Dark red last night and brown this morning  2. ONSET: When did the bleeding start?      Last night  3. EPISODES: How many times has there been blood in the urine? or How many times today?     1 4. PAIN with URINATION: Is there any pain with passing your urine? If Yes, ask: How bad is the pain?  (Scale 1-10; or mild, moderate, severe)     no 5. FEVER: Do you have a fever? If Yes, ask: What is your temperature, how was it measured, and when did it start?     no 6. ASSOCIATED SYMPTOMS: Are you passing urine more frequently than usual?     no 7. OTHER SYMPTOMS: Do you have any other symptoms? (e.g., back/flank pain, abdomen pain, vomiting)     no  Protocols used: Urine - Blood In-A-AH

## 2024-02-04 NOTE — ED Provider Notes (Signed)
 [facility]    CSN: [id number] Arrival date & time: 02/04/24  1032      History   Chief Complaint Chief Complaint  Patient presents with   Hematuria    HPI Gina Key is a 78 y.o. female.   HPI  She is in today with her daughter for hematuria.  She reports that0 yesterday she had bright red hematuria.  She is decided so she chose not to seek medical treatment.  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  She endorses being compliant with her medication.  She denies previous bleeding.  She denies any dysuria but she has been having some urinary frequency.  She denies any fever, chills, headache, dizziness, shortness of breath, chest pains, nausea, vomiting, swelling in legs feet or ankles. 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   Aortic atherosclerosis (HCC) 10/15/2023   Disc disease, degenerative, lumbar or lumbosacral 10/15/2023   Acute pulmonary embolism (HCC) 10/05/2023   Hypernatremia 10/05/2023   Dyspnea on exertion 10/05/2023   Osteopenia 12/14/2022   Essential hypertension 09/28/2021   Gastroesophageal reflux disease 09/28/2021   Seasonal allergies 09/28/2021   History of migraine 09/28/2021   Urinary incontinence 09/28/2021   Hyperlipidemia 09/28/2021   History of seizure 09/28/2021   OSA (obstructive sleep apnea) 09/28/2021    Past Surgical History:  Procedure Laterality Date   BREAST BIOPSY Right     benign   CHOLECYSTECTOMY     left and right rotator cuff      OB History   No obstetric history on file.      Home Medications    Prior to Admission medications   Medication Sig Start Date End Date Taking? Authorizing Provider  apixaban  (ELIQUIS ) 5 MG TABS tablet Take 1 tablet (5 mg total) by mouth 2 (two) times daily. 10/10/23  Yes [name], MD  Fexofenadine HCl (ALLEGRA PO) Take by mouth.   Yes [provider]  levETIRAcetam  (KEPPRA ) 500 MG tablet Take 1 tablet (500 mg total) by mouth 2 (two) times daily. 12/04/23  Yes Gayland Lauraine PARAS, NP  losartan  (COZAAR ) 100 MG tablet TAKE 1 TABLET BY MOUTH EVERY DAY 11/30/23  Yes [name], MD  metoprolol  succinate (TOPROL -XL) 25 MG 24 hr tablet TAKE 1 TABLET (25 MG TOTAL) BY MOUTH DAILY. 07/06/23  Yes [name], MD  montelukast  (SINGULAIR ) 10 MG tablet TAKE 1 TABLET BY MOUTH EVERYDAY AT BEDTIME 07/06/23  Yes [name], MD  Oxycodone  HCl 10 MG TABS Take 1 tablet (10 mg total) by mouth every 12 (twelve) hours as needed. 01/18/24  Yes Urbano Albright, MD  pantoprazole  (PROTONIX ) 20 MG tablet TAKE 1 TABLET BY MOUTH EVERY DAY 10/15/23  Yes [name], MD  potassium chloride  (KLOR-CON ) 10 MEQ tablet TAKE  1 TABLET BY MOUTH EVERY DAY 10/31/23  Yes [name], MD  rosuvastatin  (CRESTOR ) 20 MG tablet Take 1 tablet (20 mg total) by mouth daily. 12/07/23  Yes [name], MD  spironolactone  (ALDACTONE ) 25 MG tablet TAKE 1 TABLET (25 MG TOTAL) BY MOUTH DAILY. 07/06/23  Yes [name], MD  topiramate  (TOPAMAX ) 100 MG tablet TAKE 1 TABLET BY MOUTH TWICE A DAY 11/30/23  Yes [name], MD  APIXABAN  (ELIQUIS ) VTE STARTER PACK (10MG  AND 5MG ) Take as directed on package: start with two-5mg  tablets twice daily for 7 days. On day 8, switch to one-5mg  tablet twice daily. 10/06/23   [place], Camellia PARAS, DO  aspirin 325 MG EC tablet Take 325 mg by mouth daily.    [provider]  Incontinence Supply Disposable  (WINGS HL ADULT BRIEFS/XL) MISC As needed daily. 09/27/23   [name], MD  ipratropium (ATROVENT ) 0.06 % nasal spray PLACE 2 SPRAYS INTO BOTH NOSTRILS 4 TIMES DAILY. 11/30/23   [name], MD  lidocaine  (HM LIDOCAINE  PATCH) 4 % Place 1 patch onto the skin daily as needed.    [provider]    Family History Family History  Problem Relation Age of Onset   Pancreatic cancer Mother    Thyroid  cancer Sister    Colon polyps Daughter    Kidney cancer Daughter    Sleep apnea Daughter    Breast cancer Maternal Aunt 33   Seizures Paternal Grandmother    Cancer Brother        tongue   Sleep apnea Son    Stroke Son    Seizures Son    Sleep apnea Son    Seizures Grandson    Colon cancer Neg Hx    Crohn's disease Neg Hx    Esophageal cancer Neg Hx    Rectal cancer Neg Hx    Stomach cancer Neg Hx    Ulcerative colitis Neg Hx    Dementia Neg Hx     Social History Social History   Tobacco Use   Smoking status: Never   Smokeless tobacco: Never  Vaping Use   Vaping status: Never Used  Substance Use Topics   Alcohol use: Not Currently   Drug use: Never     Allergies   Patient has no known allergies.   Review of Systems Review of Systems   Physical Exam Triage Vital Signs ED Triage Vitals  Encounter Vitals Group     BP 02/04/24 1122 (!) 142/81     Girls Systolic BP Percentile --      Girls Diastolic BP Percentile --      Boys Systolic BP Percentile --      Boys Diastolic BP Percentile --      Pulse Rate 02/04/24 1122 66     Resp 02/04/24 1122 18     Temp 02/04/24 1122 98.2 F (36.8 C)     Temp Source 02/04/24 1122 Oral     SpO2 02/04/24 1122 99 %     Weight --      Height --      Head Circumference --      Peak Flow --      Pain Score 02/04/24 1123 0     Pain Loc --      Pain Education --      Exclude from Growth Chart --    No data found.  Updated Vital Signs BP (!) 142/81   Pulse 66  Temp 98.2 F (36.8 C) (Oral)   Resp 18   SpO2  99%   Visual Acuity Right Eye Distance:   Left Eye Distance:   Bilateral Distance:    Right Eye Near:   Left Eye Near:    Bilateral Near:     Physical Exam Constitutional:      General: She is not in acute distress.    Appearance: She is obese. She is not ill-appearing.  HENT:     Head: Normocephalic and atraumatic.     Nose: Nose normal.     Mouth/Throat:     Mouth: Mucous membranes are moist.  Cardiovascular:     Rate and Rhythm: Normal rate and regular rhythm.     Pulses: Normal pulses.     Heart sounds: Normal heart sounds.  Pulmonary:     Effort: Pulmonary effort is normal.     Breath sounds: Normal breath sounds.  Musculoskeletal:        General: Normal range of motion.     Cervical back: Normal range of motion.  Skin:    General: Skin is warm.     Capillary Refill: Capillary refill takes less than 2 seconds.  Neurological:     General: No focal deficit present.     Mental Status: She is alert and oriented to person, place, and time.  Psychiatric:        Mood and Affect: Mood normal.        Behavior: Behavior normal.      UC Treatments / Results  Labs (all labs ordered are listed, but only abnormal results are displayed) Labs Reviewed  POCT URINALYSIS DIP (MANUAL ENTRY) - Abnormal; Notable for the following components:      Result Value   Clarity, UA hazy (*)    Blood, UA large (*)    All other components within normal limits    EKG   Radiology No results found.  Procedures Procedures (including critical care time)  Medications Ordered in UC Medications - No data to display  Initial Impression / Assessment and Plan / UC Course  I have reviewed the triage vital signs and the nursing notes.  Pertinent labs & imaging results that were available during my care of the patient were reviewed by me and considered in my medical decision making (see chart for details).     Hematuria Final Clinical Impressions(s) / UC Diagnoses   Final diagnoses:   Hematuria, unspecified type     Discharge Instructions      Please arrive at the main entrance (Entrance A) of [facility] at 1:30pm for a 2:00pm ultrasound. You will go to the first floor radiology for check-in. **AT 1:00PM, DRINK 32 OUNCES OF WATER AND HOLD YOUR BLADDER -- DO NOT URINATE AFTER 1:00PM**.  You have been diagnosed hematuria while on Eliquis .  Your urinalysis indicated large RBCs with no bacteria.  You have been sent for an ultrasound to evaluate your bladder.  As discussed I would like for you to call the doctor that controlled     ED Prescriptions   None    PDMP not reviewed this encounter.   Myrna Salines [place], [place] 02/04/24 1452

## 2024-02-04 NOTE — ED Triage Notes (Addendum)
 C/O polyuria for quite a while; yesterday had 2 episodes hematuria, but none today. C/O having some urinary incontinence for months. Denies dysuria or pain. Pt is currently on Eliquis 

## 2024-02-04 NOTE — Discharge Instructions (Addendum)
 Please arrive at the main entrance (Entrance A) of [facility] at 1:30pm for a 2:00pm ultrasound. You will go to the first floor radiology for check-in. **AT 1:00PM, DRINK 32 OUNCES OF WATER AND HOLD YOUR BLADDER -- DO NOT URINATE AFTER 1:00PM**.  You have been diagnosed hematuria while on Eliquis .  Your urinalysis indicated large RBCs with no bacteria.  You have been sent for an ultrasound to evaluate your bladder.  As discussed I would like for you to call the doctor that controlled

## 2024-02-05 ENCOUNTER — Emergency Department (HOSPITAL_COMMUNITY)

## 2024-02-05 ENCOUNTER — Other Ambulatory Visit: Payer: Self-pay

## 2024-02-05 ENCOUNTER — Emergency Department (HOSPITAL_COMMUNITY)
Admission: EM | Admit: 2024-02-05 | Discharge: 2024-02-05 | Disposition: A | Discharge status: Home / Self Care | Source: Ambulatory Visit | Attending: Student | Admitting: Student

## 2024-02-05 ENCOUNTER — Encounter (HOSPITAL_COMMUNITY): Payer: Self-pay

## 2024-02-05 ENCOUNTER — Ambulatory Visit: Payer: Self-pay

## 2024-02-05 DIAGNOSIS — I10 Essential (primary) hypertension: Secondary | ICD-10-CM | POA: Insufficient documentation

## 2024-02-05 DIAGNOSIS — K8689 Other specified diseases of pancreas: Secondary | ICD-10-CM | POA: Diagnosis not present

## 2024-02-05 DIAGNOSIS — K5730 Diverticulosis of large intestine without perforation or abscess without bleeding: Secondary | ICD-10-CM | POA: Diagnosis not present

## 2024-02-05 DIAGNOSIS — Z79899 Other long term (current) drug therapy: Secondary | ICD-10-CM | POA: Diagnosis not present

## 2024-02-05 DIAGNOSIS — N390 Urinary tract infection, site not specified: Secondary | ICD-10-CM | POA: Insufficient documentation

## 2024-02-05 DIAGNOSIS — N200 Calculus of kidney: Secondary | ICD-10-CM | POA: Diagnosis not present

## 2024-02-05 DIAGNOSIS — Z7982 Long term (current) use of aspirin: Secondary | ICD-10-CM | POA: Diagnosis not present

## 2024-02-05 DIAGNOSIS — R9389 Abnormal findings on diagnostic imaging of other specified body structures: Secondary | ICD-10-CM | POA: Insufficient documentation

## 2024-02-05 DIAGNOSIS — Z7901 Long term (current) use of anticoagulants: Secondary | ICD-10-CM | POA: Diagnosis not present

## 2024-02-05 DIAGNOSIS — R319 Hematuria, unspecified: Secondary | ICD-10-CM | POA: Diagnosis present

## 2024-02-05 DIAGNOSIS — K7689 Other specified diseases of liver: Secondary | ICD-10-CM | POA: Diagnosis not present

## 2024-02-05 LAB — CBC WITH DIFFERENTIAL/PLATELET
Abs Immature Granulocytes: 0.03 K/uL (ref 0.00–0.07)
Basophils Absolute: 0 K/uL (ref 0.0–0.1)
Basophils Relative: 1 %
Eosinophils Absolute: 0.1 K/uL (ref 0.0–0.5)
Eosinophils Relative: 1 %
HCT: 39.3 % (ref 36.0–46.0)
Hemoglobin: 11.4 g/dL — ABNORMAL LOW (ref 12.0–15.0)
Immature Granulocytes: 0 %
Lymphocytes Relative: 33 %
Lymphs Abs: 2.7 K/uL (ref 0.7–4.0)
MCH: 26.4 pg (ref 26.0–34.0)
MCHC: 29 g/dL — ABNORMAL LOW (ref 30.0–36.0)
MCV: 91 fL (ref 80.0–100.0)
Monocytes Absolute: 0.5 K/uL (ref 0.1–1.0)
Monocytes Relative: 6 %
Neutro Abs: 4.9 K/uL (ref 1.7–7.7)
Neutrophils Relative %: 59 %
Platelets: 186 K/uL (ref 150–400)
RBC: 4.32 MIL/uL (ref 3.87–5.11)
RDW: 13.2 % (ref 11.5–15.5)
WBC: 8.3 K/uL (ref 4.0–10.5)
nRBC: 0 % (ref 0.0–0.2)

## 2024-02-05 LAB — COMPREHENSIVE METABOLIC PANEL WITH GFR
ALT: 18 U/L (ref 0–44)
AST: 19 U/L (ref 15–41)
Albumin: 3.8 g/dL (ref 3.5–5.0)
Alkaline Phosphatase: 100 U/L (ref 38–126)
Anion gap: 7 (ref 5–15)
BUN: 12 mg/dL (ref 8–23)
CO2: 22 mmol/L (ref 22–32)
Calcium: 9.3 mg/dL (ref 8.9–10.3)
Chloride: 112 mmol/L — ABNORMAL HIGH (ref 98–111)
Creatinine, Ser: 0.82 mg/dL (ref 0.44–1.00)
GFR, Estimated: >60 mL/min (ref >60)
Glucose, Bld: 80 mg/dL (ref 70–99)
Potassium: 3.9 mmol/L (ref 3.5–5.1)
Sodium: 141 mmol/L (ref 135–145)
Total Bilirubin: 0.5 mg/dL (ref 0.0–1.2)
Total Protein: 6.9 g/dL (ref 6.5–8.1)

## 2024-02-05 LAB — URINALYSIS, ROUTINE W REFLEX MICROSCOPIC
Bilirubin Urine: NEGATIVE
Glucose, UA: NEGATIVE mg/dL
Ketones, ur: NEGATIVE mg/dL
Nitrite: NEGATIVE
Protein, ur: NEGATIVE mg/dL
Specific Gravity, Urine: 1.004 — ABNORMAL LOW (ref 1.005–1.030)
pH: 6 (ref 5.0–8.0)

## 2024-02-05 LAB — PROTIME-INR
INR: 1.1 (ref 0.8–1.2)
Prothrombin Time: 14.6 s (ref 11.4–15.2)

## 2024-02-05 LAB — CK: Total CK: 62 U/L (ref 38–234)

## 2024-02-05 MED ORDER — IOHEXOL 300 MG/ML  SOLN
100.0000 mL | Freq: Once | INTRAMUSCULAR | Status: AC | PRN
  Administered 2024-02-05: 100 mL via INTRAVENOUS

## 2024-02-05 MED ORDER — CEFADROXIL 500 MG PO CAPS: 500.0000 mg | ORAL_CAPSULE | Freq: Two times a day (BID) | ORAL | 0 refills | Status: AC

## 2024-02-05 MED ORDER — CEFADROXIL 500 MG PO CAPS
500.0000 mg | ORAL_CAPSULE | Freq: Two times a day (BID) | ORAL | Status: DC
  Administered 2024-02-05: 500 mg via ORAL
  Filled 2024-02-05: qty 1

## 2024-02-05 NOTE — Telephone Encounter (Signed)
 FYI Only or Action Required?: Action required by provider: update on patient condition. Requesting appointment.  Patient was last seen in primary care on 12/28/2023 by [name], MD.  Called Nurse Triage reporting Hematuria.  Symptoms began several days ago.  Interventions attempted: Nothing.  Symptoms are: frank blood in urine, abdominal pain last night (resolved) unchanged.  Triage Disposition: Go to ED Now (Notify PCP)  Patient/caregiver understands and will follow disposition?: No         Copied from CRM [id number]. Topic: Clinical - Red Word Triage >> Feb 05, 2024  9:06 AM Adelita E wrote: [facility] that prompted transfer to Nurse Triage: Patient went to UC yesterday, and daughter, [name], stated she is still having visible blood in urine. Patient was having abdomen pain last night. Reason for Disposition  Passing pure blood or large blood clots (i.e., size > a dime)  (Exception: Fleck or small strands.)  Answer Assessment - Initial Assessment Questions Per NP King's note from urgent care to Dr Mercer: She was informed that if there is any additional bleeding she needs to go to the emergency room to be evaluated. Daughter states they were not advised to go to the ER for bleeding that returned. Advised ED and she would rather have patient come in and see Dr Mercer for a follow up. Called [facility], Margaret, and notified staff of ED refusal.  1. COLOR of URINE: Describe the color of the urine.  (e.g., tea-colored, pink, red, bloody) Do you have blood clots in your urine? (e.g., none, pea, grape, small coin)     Red, bloody. No blood clots.  2. ONSET: When did the bleeding start?      Sunday night.  3. EPISODES: How many times has there been blood in the urine? or How many times today?     Patient has frank blood in urine Sunday night, yesterday morning and returned this morning.  4. PAIN with URINATION: Is there any pain with passing your urine? If Yes, ask: How  bad is the pain?  (Scale 1-10; or mild, moderate, severe)     No.  5. FEVER: Do you have a fever? If Yes, ask: What is your temperature, how was it measured, and when did it start?     No.  6. ASSOCIATED SYMPTOMS: Are you passing urine more frequently than usual?     No.  7. OTHER SYMPTOMS: Do you have any other symptoms? (e.g., back/flank pain, abdomen pain, vomiting)     Daughter states last night the patient was complaining of abdominal pain (patient states she doesn't remember). Denies any nausea or vomiting.  8. PREGNANCY: Is there any chance you are pregnant? When was your last menstrual period?     N/A.  Protocols used: Urine - Blood In-A-AH

## 2024-02-05 NOTE — ED Provider Notes (Signed)
[place] EMERGENCY DEPARTMENT AT [facility] Provider Note  CSN: [id number] Arrival date & time: 02/05/24 1052  Chief Complaint(s) Hematuria  HPI Emanuel Campos is a 78 y.o. 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  States that over the last 3 days she has had bright red gross hematuria in the mornings that will ultimately clear throughout the day.  Went to urgent care yesterday and had a negative urinalysis but did have large blood.  Had a renal ultrasound performed yesterday that was reassuringly normal.  Was told to come the emergency department if bleeding persisted.  Denies associated urinary retention, dysuria or any other associated associated symptoms.   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   Aortic atherosclerosis (HCC) 10/15/2023   Disc disease, degenerative, lumbar or lumbosacral 10/15/2023   Acute pulmonary embolism (HCC) 10/05/2023   Hypernatremia 10/05/2023   Dyspnea on exertion 10/05/2023   Osteopenia 12/14/2022   Essential hypertension 09/28/2021   Gastroesophageal reflux disease 09/28/2021   Seasonal allergies 09/28/2021   History of migraine 09/28/2021   Urinary incontinence 09/28/2021   Hyperlipidemia 09/28/2021   History of seizure 09/28/2021   OSA (obstructive sleep apnea) 09/28/2021   Home Medication(s) Prior to Admission medications    Medication Sig Start Date End Date Taking? Authorizing Provider  cefadroxil  (DURICEF) 500 MG capsule Take 1 capsule (500 mg total) by mouth 2 (two) times daily for 7 days. 02/05/24 02/12/24 Yes Shavell Nored, MD  apixaban  (ELIQUIS ) 5 MG TABS tablet Take 1 tablet (5 mg total) by mouth 2 (two) times daily. 10/10/23   [name], MD  APIXABAN  (ELIQUIS ) VTE STARTER PACK (10MG  AND 5MG ) Take as directed on package: start with two-5mg  tablets twice daily for 7 days. On day 8, switch to one-5mg  tablet twice daily. 10/06/23   [place], Camellia PARAS, DO  aspirin 325 MG EC tablet Take 325 mg by mouth daily.    [provider]  Fexofenadine HCl (ALLEGRA PO) Take by mouth.    [provider]  Incontinence Supply Disposable (WINGS HL ADULT BRIEFS/XL) MISC As needed daily. 09/27/23   [name], MD  ipratropium (ATROVENT ) 0.06 % nasal spray PLACE 2 SPRAYS INTO BOTH NOSTRILS 4 TIMES DAILY. 11/30/23   [name], MD  levETIRAcetam  (KEPPRA ) 500 MG tablet Take 1 tablet (500 mg total) by mouth 2 (two) times daily. 12/04/23   Gayland Lauraine PARAS, NP  lidocaine  (HM LIDOCAINE  PATCH) 4 % Place 1 patch onto the skin daily as needed.    [provider]  losartan  (COZAAR ) 100 MG tablet TAKE 1 TABLET BY MOUTH EVERY DAY 11/30/23   [name], MD  metoprolol  succinate (TOPROL -XL) 25 MG 24 hr tablet TAKE 1 TABLET (25 MG TOTAL) BY MOUTH DAILY. 07/06/23  [name], MD  montelukast  (SINGULAIR ) 10 MG tablet TAKE 1 TABLET BY MOUTH EVERYDAY AT BEDTIME 07/06/23   [name], MD  Oxycodone  HCl 10 MG TABS Take 1 tablet (10 mg total) by mouth every 12 (twelve) hours as needed. 01/18/24   Urbano Albright, MD  pantoprazole  (PROTONIX ) 20 MG tablet TAKE 1 TABLET BY MOUTH EVERY DAY 10/15/23   [name], MD  potassium chloride  (KLOR-CON ) 10 MEQ tablet TAKE 1 TABLET BY MOUTH EVERY DAY 10/31/23   [name], MD  rosuvastatin  (CRESTOR ) 20 MG tablet Take 1 tablet (20 mg total) by mouth daily.  12/07/23   [name], MD  spironolactone  (ALDACTONE ) 25 MG tablet TAKE 1 TABLET (25 MG TOTAL) BY MOUTH DAILY. 07/06/23   [name], MD  topiramate  (TOPAMAX ) 100 MG tablet TAKE 1 TABLET BY MOUTH TWICE A DAY 11/30/23   [name], MD                                                                                                                                    Past Surgical History Past Surgical History:  Procedure Laterality Date   BREAST BIOPSY Right    benign   CHOLECYSTECTOMY     left and right rotator cuff     Family History Family History  Problem Relation Age of Onset   Pancreatic cancer Mother    Thyroid  cancer Sister    Colon polyps Daughter    Kidney cancer Daughter    Sleep apnea Daughter    Breast cancer Maternal Aunt 16   Seizures Paternal Grandmother    Cancer Brother        tongue   Sleep apnea Son    Stroke Son    Seizures Son    Sleep apnea Son    Seizures Grandson    Colon cancer Neg Hx    Crohn's disease Neg Hx    Esophageal cancer Neg Hx    Rectal cancer Neg Hx    Stomach cancer Neg Hx    Ulcerative colitis Neg Hx    Dementia Neg Hx     Social History Social History   Tobacco Use   Smoking status: Never   Smokeless tobacco: Never  Vaping Use   Vaping status: Never Used  Substance Use Topics   Alcohol use: Not Currently   Drug use: Never   Allergies Patient has no known allergies.  Review of Systems Review of Systems  Genitourinary:  Positive for hematuria.    Physical Exam Vital Signs  I have reviewed the triage vital signs BP (!) 153/80 (BP Location: Left Arm)   Pulse 72   Temp 98.2 F (36.8 C) (Oral)   Resp 16   Ht 5' 2 (1.575 m)   Wt 93 kg   SpO2 99%   BMI 37.49 kg/m   Physical Exam Vitals and nursing note reviewed.  Constitutional:  General: She is not in acute distress.    Appearance: She is well-developed.  HENT:     Head: Normocephalic and atraumatic.  Eyes:     Conjunctiva/sclera:  Conjunctivae normal.  Cardiovascular:     Rate and Rhythm: Normal rate and regular rhythm.     Heart sounds: No murmur heard. Pulmonary:     Effort: Pulmonary effort is normal. No respiratory distress.     Breath sounds: Normal breath sounds.  Abdominal:     Palpations: Abdomen is soft.     Tenderness: There is no abdominal tenderness.  Musculoskeletal:        General: No swelling.     Cervical back: Neck supple.  Skin:    General: Skin is warm and dry.     Capillary Refill: Capillary refill takes less than 2 seconds.  Neurological:     Mental Status: She is alert.  Psychiatric:        Mood and Affect: Mood normal.     ED Results and Treatments Labs (all labs ordered are listed, but only abnormal results are displayed) Labs Reviewed  URINALYSIS, ROUTINE W REFLEX MICROSCOPIC - Abnormal; Notable for the following components:      Result Value   Color, Urine STRAW (*)    Specific Gravity, Urine 1.004 (*)    Hgb urine dipstick LARGE (*)    Leukocytes,Ua TRACE (*)    Bacteria, UA MANY (*)    All other components within normal limits  COMPREHENSIVE METABOLIC PANEL WITH GFR - Abnormal; Notable for the following components:   Chloride 112 (*)    All other components within normal limits  CBC WITH DIFFERENTIAL/PLATELET - Abnormal; Notable for the following components:   Hemoglobin 11.4 (*)    MCHC 29.0 (*)    All other components within normal limits  URINE CULTURE  PROTIME-INR  CK                                                                                                                          Radiology CT ABDOMEN PELVIS W CONTRAST Result Date: 02/05/2024 CLINICAL DATA:  Hematuria, gross/macroscopic EXAM: CT ABDOMEN AND PELVIS WITH CONTRAST TECHNIQUE: Multidetector CT imaging of the abdomen and pelvis was performed using the standard protocol following bolus administration of intravenous contrast. RADIATION DOSE REDUCTION: This exam was performed according to the  departmental dose-optimization program which includes automated exposure control, adjustment of the mA and/or kV according to patient size and/or use of iterative reconstruction technique. CONTRAST:  OMNIPAQUE  IOHEXOL  300 MG/ML  SOLN COMPARISON:  February 04, 2024, October 04, 2023 FINDINGS: Lower chest: No focal airspace consolidation or pleural effusion. Hepatobiliary: The most superior aspect of the right hepatic dome was excluded from the field of view. A couple of scattered hepatic cysts present, unchanged.Focal fatty infiltration along the falciform ligament.Cholecystectomy. No intrahepatic or extrahepatic biliary ductal dilation. The portal veins are patent. Pancreas: Moderate dilation of the pancreatic duct in the midbody with overlying upstream  body and tail atrophy. No peripancreatic inflammation or fluid collection. Spleen: Normal size. No mass. Adrenals/Urinary Tract: No adrenal masses. No renal mass. Small nonobstructive calculus in the lower pole of the left kidney. No hydronephrosis. Circumferential wall thickening of the urinary bladder. Stomach/Bowel: The stomach is decompressed without focal abnormality. No small bowel wall thickening or inflammation. No small bowel obstruction.Normal appendix. Sigmoid colonic diverticulosis. No changes of acute diverticulitis. Vascular/Lymphatic: No aortic aneurysm. Diffuse aortoiliac atherosclerosis. No intraabdominal or pelvic lymphadenopathy. Reproductive: Hysterectomy. No concerning adnexal mass.No free pelvic fluid. Other: No pneumoperitoneum, ascites, or mesenteric inflammation. Musculoskeletal: No acute fracture or destructive lesion. Multilevel degenerative disc disease of the spine. Mild grade 1 anterolisthesis of L3 on L4. Thoracic DISH. IMPRESSION: 1. No acute intra-abdominal or pelvic abnormality. 2. Small nonobstructive calculus in the lower pole of the left kidney. No hydronephrosis. 3. Moderate dilation of the pancreatic duct in the midbody and  tail. Overlying parenchymal atrophy is also present. This may be due to chronic pancreatitis or an obstructive neoplasm. A multiphase abdominal MRI with IV contrast is recommended for further characterization. No superimposed findings to suggest acute pancreatitis. Aortic Atherosclerosis (ICD10-I70.0). Electronically Signed   By: Rogelia Myers M.D.   On: 02/05/2024 13:54    Pertinent labs & imaging results that were available during my care of the patient were reviewed by me and considered in my medical decision making (see MDM for details).  Medications Ordered in ED Medications  cefadroxil  (DURICEF) capsule 500 mg (500 mg Oral Given 02/05/24 1333)  iohexol  (OMNIPAQUE ) 300 MG/ML solution 100 mL (100 mLs Intravenous Contrast Given 02/05/24 1303)                                                                                                                                     Procedures Procedures  (including critical care time)  Medical Decision Making / ED Course   This patient presents to the ED for concern of hematuria, this involves an extensive number of treatment options, and is a complaint that carries with it a high risk of complications and morbidity.  The differential diagnosis includes UTI, bladder stone, nephrolithiasis, renal cell carcinoma, bladder cancer  MDM: Patient seen emergency room for evaluation of hematuria.  Physical exam is unremarkable.  Laboratory evaluation with a hemoglobin of 11.4 which is improved from previous, INR is normal, urinalysis with large urine by dipstick but no red blood cells, 6-10 white blood cells, trace leuk esterase and many bacteria.  Urine culture sent and patient will be started on Duricef.  CT abdomen pelvis negative for bladder carcinoma or bladder stone, small nonobstructive calculus in the lower pole of the left kidney, moderate pancreatic duct dilatation and overlying parenchymal atrophy.  This is likely an incidental finding today but  given her family history of pancreatic cancer would benefit from outpatient MRI imaging.  Presentation is likely secondary to UTI and her hematuria is likely secondary  to her DOAC use.  I did inform the patient is very important to continue taking her DOAC and had a small amount of blood in the urine is a risk we may have to tolerate the short-term.  She was instructed to return to the emergency department if she is having blood clots or urine or having difficulty getting the urine out.  Low suspicion for rhabdomyolysis given normal CK.  At this time she does not meet inpatient criteria for admission and will be discharged with outpatient follow-up.  Return precautions given which she and her daughter voiced understanding.   Additional history obtained: -Additional history obtained from daughter -External records from outside source obtained and reviewed including: Chart review including previous notes, labs, imaging, consultation notes   Lab Tests: -I ordered, reviewed, and interpreted labs.   The pertinent results include:   Labs Reviewed  URINALYSIS, ROUTINE W REFLEX MICROSCOPIC - Abnormal; Notable for the following components:      Result Value   Color, Urine STRAW (*)    Specific Gravity, Urine 1.004 (*)    Hgb urine dipstick LARGE (*)    Leukocytes,Ua TRACE (*)    Bacteria, UA MANY (*)    All other components within normal limits  COMPREHENSIVE METABOLIC PANEL WITH GFR - Abnormal; Notable for the following components:   Chloride 112 (*)    All other components within normal limits  CBC WITH DIFFERENTIAL/PLATELET - Abnormal; Notable for the following components:   Hemoglobin 11.4 (*)    MCHC 29.0 (*)    All other components within normal limits  URINE CULTURE  PROTIME-INR  CK    Imaging Studies ordered: I ordered imaging studies including CTAP I independently visualized and interpreted imaging. I agree with the radiologist interpretation   Medicines ordered and prescription  drug management: Meds ordered this encounter  Medications   iohexol  (OMNIPAQUE ) 300 MG/ML solution 100 mL   cefadroxil  (DURICEF) capsule 500 mg   cefadroxil  (DURICEF) 500 MG capsule    Sig: Take 1 capsule (500 mg total) by mouth 2 (two) times daily for 7 days.    Dispense:  14 capsule    Refill:  0    -I have reviewed the patients home medicines and have made adjustments as needed  Critical interventions none   Social Determinants of Health:  Factors impacting patients care include: none   Reevaluation: After the interventions noted above, I reevaluated the patient and found that they have :improved  Co morbidities that complicate the patient evaluation  Past Medical History:  Diagnosis Date   Acid reflux    Allergy    Anxiety    DDD (degenerative disc disease), lumbar    Depression    Facet joint disease    GERD (gastroesophageal reflux disease)    High cholesterol    Hypertension    Low vitamin D  level    OSA (obstructive sleep apnea)    Primary osteoarthritis involving multiple joints    Pulmonary embolism (HCC)    Seizures (HCC)    Sleep apnea    C PAP      Dispostion: I considered admission for this patient, but at this time she does not meet inpatient criteria for admission and will be discharged with outpatient follow-up.     Final Clinical Impression(s) / ED Diagnoses Final diagnoses:  Hematuria, unspecified type  Urinary tract infection with hematuria, site unspecified  Abnormal finding on CT scan     @PCDICTATION @    Albertina Dixon, MD 02/05/24  1551  

## 2024-02-05 NOTE — Discharge Instructions (Addendum)
 You were seen in the emerged part for evaluation of blood in the urine.  Your workup is concerning for urinary tract infection and we will treat with antibiotics.  There was an incidental lesion found on your pancreas from CAT scan and today in the ER.  This very well might be benign but given your family history of pancreatic cancer it is important that you call your primary care physician to set up an outpatient MRI of the pancreas.  Please call today.

## 2024-02-05 NOTE — ED Triage Notes (Signed)
 Pt came in for hematuria since Sunday. PCP was called and they were told to go to UC. Her urine culture came back with high WBC. Since she is still bleeding, she was told to come to the ED to be evaluated.

## 2024-02-06 ENCOUNTER — Encounter: Payer: Self-pay | Admitting: Family Medicine

## 2024-02-06 ENCOUNTER — Ambulatory Visit (INDEPENDENT_AMBULATORY_CARE_PROVIDER_SITE_OTHER): Admitting: Family Medicine

## 2024-02-06 ENCOUNTER — Telehealth: Payer: Self-pay

## 2024-02-06 VITALS — BP 120/68 | Temp 98.5°F | Ht 62.0 in | Wt 209.8 lb

## 2024-02-06 DIAGNOSIS — Z6838 Body mass index (BMI) 38.0-38.9, adult: Secondary | ICD-10-CM | POA: Diagnosis not present

## 2024-02-06 DIAGNOSIS — N200 Calculus of kidney: Secondary | ICD-10-CM

## 2024-02-06 DIAGNOSIS — K8689 Other specified diseases of pancreas: Secondary | ICD-10-CM | POA: Diagnosis not present

## 2024-02-06 DIAGNOSIS — E66812 Obesity, class 2: Secondary | ICD-10-CM

## 2024-02-06 DIAGNOSIS — K7689 Other specified diseases of liver: Secondary | ICD-10-CM

## 2024-02-06 DIAGNOSIS — R319 Hematuria, unspecified: Secondary | ICD-10-CM

## 2024-02-06 DIAGNOSIS — Z86711 Personal history of pulmonary embolism: Secondary | ICD-10-CM | POA: Diagnosis not present

## 2024-02-06 DIAGNOSIS — I10 Essential (primary) hypertension: Secondary | ICD-10-CM

## 2024-02-06 DIAGNOSIS — Z7901 Long term (current) use of anticoagulants: Secondary | ICD-10-CM

## 2024-02-06 NOTE — Transitions of Care (Post Inpatient/ED Visit) (Signed)
 02/06/2024  Name: Gina Key MRN: [id number] DOB: 22-Apr-1946  Today's TOC FU Call Status: Today's TOC FU Call Status:: Successful TOC FU Call Completed TOC FU Call Complete Date: 02/06/24 Patient's Name and Date of Birth confirmed.  Transition Care Management Follow-up Telephone Call Date of Discharge: 02/05/24 Discharge Facility: [facility]) Type of Discharge: Emergency Department Reason for ED Visit: Other: How have you been since you were released from the hospital?: Better Any questions or concerns?: No  Items Reviewed: Did you receive and understand the discharge instructions provided?: Yes Medications obtained,verified, and reconciled?: Yes (Medications Reviewed) Any new allergies since your discharge?: No Dietary orders reviewed?: No Do you have support at home?: Yes People in Home [RPT]: child(ren), adult Name of Support/Comfort Primary Source: betty  Medications Reviewed Today: Medications Reviewed Today     Reviewed by Elner Shan NOVAK, CMA (Certified Medical Assistant) on 02/06/24 at 1600  Med List Status: <None>   Medication Order Taking? Sig Documenting Provider Last Dose Status Informant  apixaban  (ELIQUIS ) 5 MG TABS tablet [id number] Yes Take 1 tablet (5 mg total) by mouth 2 (two) times daily. [name], MD  Active   APIXABAN  (ELIQUIS ) VTE STARTER PACK (10MG  AND 5MG ) [id number] Yes Take as directed on package: start with two-5mg  tablets twice daily for 7 days. On day 8, switch to one-5mg  tablet twice daily. [place], Eric J, DO  Active   aspirin 325 MG EC tablet [id number] Yes Take 325 mg by mouth daily. [provider]  Active Child  cefadroxil  (DURICEF) 500 MG capsule [id number] Yes Take 1 capsule (500 mg total) by mouth 2 (two) times daily for 7 days. Kommor, [place], MD  Active   Fexofenadine HCl (ALLEGRA PO) [id number] Yes Take by mouth. [provider]  Active   Incontinence Supply Disposable (WINGS HL ADULT BRIEFS/XL) MISC  [id number] Yes As needed daily. [name], MD  Active Child  ipratropium (ATROVENT ) 0.06 % nasal spray [id number] Yes PLACE 2 SPRAYS INTO BOTH NOSTRILS 4 TIMES DAILY. [name], MD  Active   levETIRAcetam  (KEPPRA ) 500 MG tablet [id number] Yes Take 1 tablet (500 mg total) by mouth 2 (two) times daily. Gayland Lauraine PARAS, NP  Active   lidocaine  (HM LIDOCAINE  PATCH) 4 % [id number] Yes Place 1 patch onto the skin daily as needed. [provider]  Active Child           Med Note ANICE, TYE HERO   Fri May 18, 2023 10:16 AM) [facility]  lidocaine  (XYLOCAINE ) 1 % (with pres) injection 1 mL [id number]   Urbano Albright, MD  Active   lidocaine  (XYLOCAINE ) 1 % (with pres) injection 4 mL [id number]   Urbano Albright, MD  Active   losartan  (COZAAR ) 100 MG tablet [id number] Yes TAKE 1 TABLET BY MOUTH EVERY DAY [name], MD  Active   metoprolol  succinate (TOPROL -XL) 25 MG 24 hr tablet [id number] Yes TAKE 1 TABLET (25 MG TOTAL) BY MOUTH DAILY. [name], MD  Active Child  montelukast  (SINGULAIR ) 10 MG tablet [id number] Yes TAKE 1 TABLET BY MOUTH EVERYDAY AT BEDTIME [name], MD  Active Child  Oxycodone  HCl 10 MG TABS [id number] Yes Take 1 tablet (10 mg total) by mouth every 12 (twelve) hours as needed. Urbano Albright, MD  Active            Med Note ARNELL, CHRISTINA L   Mon Feb 04, 2024 11:24 AM) prn  pantoprazole  (PROTONIX ) 20 MG tablet [id number]  Yes TAKE 1 TABLET BY MOUTH EVERY DAY [name], MD  Active   potassium chloride  (KLOR-CON ) 10 MEQ tablet [id number] Yes TAKE 1 TABLET BY MOUTH EVERY DAY [name], MD  Active   rosuvastatin  (CRESTOR ) 20 MG tablet [id number] Yes Take 1 tablet (20 mg total) by mouth daily. [name], MD  Active   spironolactone  (ALDACTONE ) 25 MG tablet [id number] Yes TAKE 1 TABLET (25 MG TOTAL) BY MOUTH DAILY. [name], MD  Active Child  topiramate  (TOPAMAX ) 100 MG tablet [id number] Yes TAKE 1 TABLET BY MOUTH TWICE A DAY [name], MD  Active   triamcinolone  acetonide (KENALOG -40) injection 40 mg [id number]   Urbano Albright, MD  Active   triamcinolone  acetonide (KENALOG -40) injection 40 mg [id number]   Urbano Albright, MD  Active             Home Care and Equipment/Supplies: Were Home Health Services Ordered?: No Any new equipment or medical supplies ordered?: No  Functional Questionnaire: Do you need assistance with bathing/showering or dressing?: No Do you need assistance with meal preparation?: No Do you need assistance with eating?: No Do you have difficulty maintaining continence: No Do you need assistance with getting out of bed/getting out of a chair/moving?: No Do you have difficulty managing or taking your medications?: No  Follow up appointments reviewed: PCP Follow-up appointment confirmed?: Yes Date of PCP follow-up appointment?: 02/06/24 Follow-up Provider: Dr. Mercer [facility] Follow-up appointment confirmed?: NA Do you need transportation to your follow-up appointment?: No Do you understand care options if your condition(s) worsen?: Yes-patient verbalized understanding    SIGNATURE Zaidy Absher,CMA

## 2024-02-06 NOTE — Patient Instructions (Addendum)
 You had a CT stone study 10/04/2023 at which time you did not have any kidney stones.  You currently have a kidney stone in your left kidney.  They will call the appointment for the MRI.

## 2024-02-06 NOTE — Progress Notes (Signed)
 Established Patient Office Visit   Subjective  Patient ID: Gina Key, female    DOB: 02-19-1946  Age: 78 y.o. MRN: [id number]  Chief Complaint  Patient presents with   Medical Management of Chronic Issues    Hospital follow-up, seen in the ER 7/15, for blood in the urine     Patient is a 78 year old female seen for ED follow-up.  Patient seen at [facility] on 7/14 after seeing blood in toilet.  Pt on Eliquis .  UA with hemoglobin but otherwise negative.  Pt advised to follow-up for continued symptoms.  Patient seen in ED on 7/15 for continued hematuria.  UA with bacteria, trace leuks, and RBCs.  CT abdomen with simple hepatic cyst, renal calculi and left lower pole left kidney, and pancreatic duct dilation.  Further imaging recommended to evaluate pancreas for chronic pancreatitis versus mass.  Patient's mom had a history of pancreatic cancer.  Given Rx for cefadroxil .  Patient has not noticed any hematuria this morning.  Denies abdominal pain other than occasional LUQ gas-like pain.  Patient inquires about weight loss medication.  Endorses having chocolate cake once a week and ice cream.  Unable to exercise due to deconditioning.  Becomes short of breath with increased walking.    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   Aortic atherosclerosis (HCC) 10/15/2023   Disc disease, degenerative, lumbar or lumbosacral 10/15/2023   Acute pulmonary embolism (HCC) 10/05/2023   Hypernatremia 10/05/2023   Dyspnea on exertion 10/05/2023   Osteopenia 12/14/2022   Essential hypertension 09/28/2021   Gastroesophageal reflux disease 09/28/2021   Seasonal allergies 09/28/2021   History of migraine 09/28/2021   Urinary incontinence 09/28/2021   Hyperlipidemia 09/28/2021   History of seizure 09/28/2021   OSA (obstructive sleep apnea) 09/28/2021   Past Medical History:  Diagnosis Date   Acid reflux    Allergy     Anxiety    DDD (degenerative disc disease), lumbar    Depression    Facet joint disease    GERD (gastroesophageal reflux disease)    High cholesterol    Hypertension    Low vitamin D  level    OSA (obstructive sleep apnea)    Primary osteoarthritis involving multiple joints    Pulmonary embolism (HCC)    Seizures (HCC)    Sleep apnea    C PAP   Past Surgical History:  Procedure Laterality Date   BREAST BIOPSY Right    benign   CHOLECYSTECTOMY     left and right rotator cuff     Social History   Tobacco Use   Smoking status: Never   Smokeless tobacco: Never  Vaping Use   Vaping status: Never Used  Substance Use Topics   Alcohol use: Not Currently   Drug use: Never   Family History  Problem Relation Age of Onset   Pancreatic cancer Mother    Thyroid  cancer Sister    Colon polyps Daughter    Kidney cancer Daughter    Sleep apnea Daughter    Breast cancer Maternal Aunt 53   Seizures Paternal Grandmother    Cancer Brother        tongue   Sleep apnea Son    Stroke Son    Seizures Son    Sleep apnea Son    Seizures Grandson    Colon cancer Neg Hx    Crohn's disease Neg Hx    Esophageal cancer  Neg Hx    Rectal cancer Neg Hx    Stomach cancer Neg Hx    Ulcerative colitis Neg Hx    Dementia Neg Hx    No Known Allergies  ROS Negative unless stated above    Objective:     BP 120/68 (BP Location: Left Arm, Patient Position: Sitting, Cuff Size: Large)   Temp 98.5 F (36.9 C) (Oral)   Ht 5' 2 (1.575 m)   Wt 209 lb 12.8 oz (95.2 kg)   BMI 38.37 kg/m  BP Readings from Last 3 Encounters:  02/06/24 120/68  02/05/24 (!) 153/80  02/04/24 (!) 142/81   Wt Readings from Last 3 Encounters:  02/06/24 209 lb 12.8 oz (95.2 kg)  02/05/24 205 lb (93 kg)  12/28/23 207 lb 6.4 oz (94.1 kg)      Physical Exam Constitutional:      General: She is not in acute distress.    Appearance: Normal appearance.  HENT:     Head: Normocephalic and atraumatic.     Nose:  Nose normal.     Mouth/Throat:     Mouth: Mucous membranes are moist.  Cardiovascular:     Rate and Rhythm: Normal rate and regular rhythm.     Heart sounds: Normal heart sounds. No murmur heard.    No gallop.  Pulmonary:     Effort: Pulmonary effort is normal. No respiratory distress.     Breath sounds: Normal breath sounds. No wheezing, rhonchi or rales.  Abdominal:     General: Bowel sounds are normal.     Palpations: Abdomen is soft. There is no mass.     Tenderness: There is no abdominal tenderness. There is no right CVA tenderness, left CVA tenderness, guarding or rebound.  Skin:    General: Skin is warm and dry.  Neurological:     Mental Status: She is alert and oriented to person, place, and time. Mental status is at baseline.        02/06/2024   10:27 AM 09/27/2023    4:21 PM 09/03/2023    3:26 PM  Depression screen PHQ 2/9  Decreased Interest 0 0 0  Down, Depressed, Hopeless 0 0 0  PHQ - 2 Score 0 0 0  Altered sleeping 1 3   Tired, decreased energy 0 2   Change in appetite 0 1   Feeling bad or failure about yourself  0 0   Trouble concentrating 0 0   Moving slowly or fidgety/restless 0 0   Suicidal thoughts 0 0   PHQ-9 Score 1 6   Difficult doing work/chores  Not difficult at all       02/06/2024   10:27 AM 09/27/2023    4:22 PM 05/04/2023    8:16 AM 03/05/2023    3:31 PM  GAD 7 : Generalized Anxiety Score  Nervous, Anxious, on Edge 0 0 0 0  Control/stop worrying 0 0 0 0  Worry too much - different things 0 0 0 0  Trouble relaxing 0 0 0 0  Restless 0 0 0 0  Easily annoyed or irritable 0 0 0 0  Afraid - awful might happen 0 0 0 0  Total GAD 7 Score 0 0 0 0  Anxiety Difficulty  Not difficult at all Not difficult at all      No results found for any visits on 02/06/24.    Assessment & Plan:   Dilation of pancreatic duct -     MR ABDOMEN W CONTRAST;  Future  Hepatic cyst -     MR ABDOMEN W CONTRAST; Future  Hematuria, unspecified type -     MR  ABDOMEN W CONTRAST; Future  Renal calculi -     MR ABDOMEN W CONTRAST; Future  Essential hypertension  Chronic anticoagulation  Class 2 severe obesity with serious comorbidity and body mass index (BMI) of 38.0 to 38.9 in adult, unspecified obesity type (HCC)  History of pulmonary embolus (PE)  Patient seen and UCx in ED for hematuria.  Incidental findings noted on CT abdomen/pelvis with contrast 02/05/2024 including hepatic cyst, renal calculi and left lower pole of kidney, and moderate dilation of pancreatic duct in the mid body and tail with overlying parenchymal atrophy.  MRI abdomen ordered to follow-up on dilation of pancreatic duct to evaluate for chronic pancreatitis or an obstructive neoplasm.  Patient advised to keep appointment with nephrology in August.  Continue Eliquis  for history of DVT/PE.  Discussed r/b/a of anticoagulation.  Complete ABX as prescribed in ED though hematuria likely from renal calculi and not infection.   BP well-controlled.  BMI 38 kg/m.  Discussed diet modifications and chair exercises.  Advised against weight loss medication at this time.  Return if symptoms worsen or fail to improve.   On day of service, 42 minutes spent caring for this patient face-to-face, reviewing the chart, counseling and/or coordinating care for plan and treatment of diagnosis below.     Clotilda JONELLE Single, MD

## 2024-02-07 ENCOUNTER — Telehealth: Payer: Self-pay

## 2024-02-07 ENCOUNTER — Telehealth: Payer: Self-pay | Admitting: Family Medicine

## 2024-02-07 ENCOUNTER — Ambulatory Visit
Admission: RE | Admit: 2024-02-07 | Discharge: 2024-02-07 | Disposition: A | Discharge status: Home / Self Care | Source: Ambulatory Visit | Attending: Family Medicine | Admitting: Family Medicine

## 2024-02-07 DIAGNOSIS — K8689 Other specified diseases of pancreas: Secondary | ICD-10-CM | POA: Diagnosis not present

## 2024-02-07 DIAGNOSIS — N200 Calculus of kidney: Secondary | ICD-10-CM

## 2024-02-07 DIAGNOSIS — R319 Hematuria, unspecified: Secondary | ICD-10-CM

## 2024-02-07 DIAGNOSIS — K7689 Other specified diseases of liver: Secondary | ICD-10-CM

## 2024-02-07 DIAGNOSIS — N281 Cyst of kidney, acquired: Secondary | ICD-10-CM | POA: Diagnosis not present

## 2024-02-07 DIAGNOSIS — D490 Neoplasm of unspecified behavior of digestive system: Secondary | ICD-10-CM

## 2024-02-07 LAB — URINE CULTURE: Culture: 70000 — AB

## 2024-02-07 MED ORDER — GADOPICLENOL 0.5 MMOL/ML IV SOLN
10.0000 mL | Freq: Once | INTRAVENOUS | Status: AC | PRN
  Administered 2024-02-07: 10 mL via INTRAVENOUS

## 2024-02-07 NOTE — Telephone Encounter (Signed)
 Channing from [facility] Radiology call for Critical results MRI Abdominal, mass lesion of the pancreatic Duct, worrisome neoplasm such as adenocarcinoma Recommend farther Evaluation

## 2024-02-07 NOTE — Telephone Encounter (Signed)
 This provider contacted patient and her daughter regarding results of MRI done 02/07/2024 after an area of pancreatic ductal dilation noted on CT abdomen pelvis with contrast on 02/05/2024.  MRI notes a focal segment of pancreatic ductal dilation with abrupt caliber change and stricture.  Subtle mass lesion dislocation identified measuring 17 x 15 millimeters at the level caliber change and has appearance worrisome for neoplasm such as adenocarcinoma.  No other areas of malignant disease suggested.  No abnormal lymph nodes.  No liver mass.  Benign hepatic and renal cystic foci.  Discussed referral to oncology for further recommendations.  Patient and daughter are in agreeance.  Of note patient's mother had a history of pancreatic cancer.  Gina Single, MD

## 2024-02-08 ENCOUNTER — Encounter: Payer: Self-pay | Admitting: Medical Oncology

## 2024-02-08 ENCOUNTER — Telehealth (HOSPITAL_BASED_OUTPATIENT_CLINIC_OR_DEPARTMENT_OTHER): Payer: Self-pay

## 2024-02-08 NOTE — Telephone Encounter (Signed)
 Post ED Visit - Positive Culture Follow-up  Culture report reviewed by antimicrobial stewardship pharmacist: [facility] []  Rankin Dee, Pharm.D. []  Venetia Gully, Pharm.D., BCPS AQ-ID []  Garrel Crews, Pharm.D., BCPS [x]  Almarie Lunger, Pharm.D., BCPS []  [place], [address].D., BCPS, AAHIVP []  Rosaline Bihari, Pharm.D., BCPS, AAHIVP []  Vernell Meier, PharmD, BCPS []  Latanya Hint, PharmD, BCPS []  Donald Medley, PharmD, BCPS []  Rocky Bold, PharmD []  Dorothyann Alert, PharmD, BCPS []  Morene Babe, PharmD  [facility] []  Rosaline Edison, PharmD []  Romona Bliss, PharmD []  Dolphus Roller, PharmD []  Veva Seip, Rph []  Vernell Daunt) Leonce, PharmD []  Eva Allis, PharmD []  Rosaline Millet, PharmD []  Iantha Batch, PharmD []  Arvin Gauss, PharmD []  Wanda Hasting, PharmD []  Ronal Rav, PharmD []  Rocky Slade, PharmD []  Bard Jeans, PharmD   Positive urine culture Treated with Cefadroxil , organism sensitive to the same and no further patient follow-up is required at this time.  Gina Key 02/08/2024, 10:31 AM

## 2024-02-11 NOTE — Progress Notes (Signed)
 [facility] [phone]   [phone]  INITIAL CONSULTATION:  Patient Care Team: Gina Clotilda SAUNDERS, MD as PCP - General (Family Medicine)  CHIEF COMPLAINTS/PURPOSE OF CONSULTATION:  Pancreatic mass  HISTORY OF PRESENTING ILLNESS:  Gina Key 78 y.o. female with medical history significant for acid reflux, hypertension, obstructive sleep apnea, osteoarthritis, pulmonary embolism, seizures.  On review of the previous records patient was evaluated in the emergency department on 02/05/2024 for gross hematuria.  CT abdomen pelvis was performed in showed moderate dilation of the pancreatic duct in the mid body and tail overlying and parenchymal atrophy is also present. MR abdomen was ordered by PCP Dr. Mercer and performed on 02/07/24. MR shows a focal segment of pancreatic ductal dilatation with abrupt caliber change and stricture. Subtle mass lesion this location identified measuring 17 x 15 mm at the level of the caliber change and has appearance worrisome for neoplasm such as an adenocarcinoma. Image did not show any other areas concerning for malignant disease. CMP collected 02/05/24 in the ED was overall unremarkable. Further chart review shows patient was diagnosed with a submassive PE with right heart strain 10/05/23 and is currently taking Eliquis .   On exam today patient is accompanied by her daughter who provides additional history. Patient reports she is asymptomatic.  She experienced a sharp pain on the left side once, which resolved quickly. No ongoing abdominal pain, back pain, nausea, vomiting, weight loss or changes in appetite. She has intermittent diarrhea, which she attributes to her previous cholecystectomy.  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 She does not smoke or drink alcohol. She currently lives with her daughter due to past seizures. Her last colonoscopy in February 2024 was normal, as was her  last mammogram. er family history is significant for her mother having pancreatic cancer at age 20, her brother having throat cancer, and her daughter having Key cancer.   MEDICAL HISTORY:  Past Medical History:  Diagnosis Date   Acid reflux    Allergy    Anxiety    DDD (degenerative disc disease), lumbar    Depression    Facet joint disease    GERD (gastroesophageal reflux disease)    High cholesterol    Hypertension    Low vitamin D  level    OSA (obstructive sleep apnea)    Primary osteoarthritis involving multiple joints    Pulmonary embolism (HCC)    Seizures (HCC)    Sleep apnea    C PAP    SURGICAL HISTORY: Past Surgical History:  Procedure Laterality Date   BREAST BIOPSY Right    benign   CHOLECYSTECTOMY     left and right rotator cuff      SOCIAL HISTORY: Social History   Socioeconomic History   Marital status: Widowed    Spouse name: Not on file   Number of children: 6   Years of education: 86   Highest education level: Not on file  Occupational History   Not on file  Tobacco Use   Smoking status: Never   Smokeless tobacco: Never  Vaping Use   Vaping status: Never Used  Substance and Sexual Activity   Alcohol use: Not Currently   Drug use: Never   Sexual activity: Not on file  Other Topics Concern   Not on file  Social History Narrative   Lives at home with daughter   Right handed   Caffeine: none    Social Drivers of Health  Financial Resource Strain: Low Risk  (02/16/2023)   Overall Financial Resource Strain (CARDIA)    Difficulty of Paying Living Expenses: Not hard at all  Food Insecurity: No Food Insecurity (10/05/2023)   Hunger Vital Sign    Worried About Running Out of Food in the Last Year: Never true    Ran Out of Food in the Last Year: Never true  Transportation Needs: No Transportation Needs (10/05/2023)   PRAPARE - Administrator, Civil Service (Medical): No    Lack of Transportation (Non-Medical): No  Physical  Activity: Inactive (02/16/2023)   Exercise Vital Sign    Days of Exercise per Week: 0 days    Minutes of Exercise per Session: 0 min  Stress: No Stress Concern Present (02/16/2023)   [employer] of Occupational Health - Occupational Stress Questionnaire    Feeling of Stress : Not at all  Social Connections: Moderately Integrated (10/05/2023)   Social Connection and Isolation Panel    Frequency of Communication with Friends and Family: More than three times a week    Frequency of Social Gatherings with Friends and Family: More than three times a week    Attends Religious Services: More than 4 times per year    Active Member of [employer] or Organizations: Yes    Attends Banker Meetings: More than 4 times per year    Marital Status: Widowed  Intimate Partner Violence: Not At Risk (10/05/2023)   Humiliation, Afraid, Rape, and Kick questionnaire    Fear of Current or Ex-Partner: No    Emotionally Abused: No    Physically Abused: No    Sexually Abused: No    FAMILY HISTORY: Family History  Problem Relation Age of Onset   Pancreatic cancer Mother    Thyroid  cancer Sister    Colon polyps Daughter    Key cancer Daughter    Sleep apnea Daughter    Breast cancer Maternal Aunt 41   Seizures Paternal Grandmother    Cancer Brother        tongue   Sleep apnea Son    Stroke Son    Seizures Son    Sleep apnea Son    Seizures Grandson    Colon cancer Neg Hx    Crohn's disease Neg Hx    Esophageal cancer Neg Hx    Rectal cancer Neg Hx    Stomach cancer Neg Hx    Ulcerative colitis Neg Hx    Dementia Neg Hx     ALLERGIES:  has no known allergies.  MEDICATIONS:  Current Outpatient Medications  Medication Sig Dispense Refill   apixaban  (ELIQUIS ) 5 MG TABS tablet Take 1 tablet (5 mg total) by mouth 2 (two) times daily. 180 tablet 1   APIXABAN  (ELIQUIS ) VTE STARTER PACK (10MG  AND 5MG ) Take as directed on package: start with two-5mg  tablets twice daily for 7 days. On day  8, switch to one-5mg  tablet twice daily. 74 each 0   [Paused] aspirin 325 MG EC tablet Take 325 mg by mouth daily.     cefadroxil  (DURICEF) 500 MG capsule Take 1 capsule (500 mg total) by mouth 2 (two) times daily for 7 days. 14 capsule 0   Fexofenadine HCl (ALLEGRA PO) Take by mouth.     Incontinence Supply Disposable (WINGS HL ADULT BRIEFS/XL) MISC As needed daily. 100 each 11   ipratropium (ATROVENT ) 0.06 % nasal spray PLACE 2 SPRAYS INTO BOTH NOSTRILS 4 TIMES DAILY. 15 mL 1   levETIRAcetam  (KEPPRA ) 500  MG tablet Take 1 tablet (500 mg total) by mouth 2 (two) times daily. 180 tablet 3   lidocaine  (HM LIDOCAINE  PATCH) 4 % Place 1 patch onto the skin daily as needed.     losartan  (COZAAR ) 100 MG tablet TAKE 1 TABLET BY MOUTH EVERY DAY 90 tablet 1   metoprolol  succinate (TOPROL -XL) 25 MG 24 hr tablet TAKE 1 TABLET (25 MG TOTAL) BY MOUTH DAILY. 90 tablet 1   montelukast  (SINGULAIR ) 10 MG tablet TAKE 1 TABLET BY MOUTH EVERYDAY AT BEDTIME 90 tablet 1   Oxycodone  HCl 10 MG TABS Take 1 tablet (10 mg total) by mouth every 12 (twelve) hours as needed. 30 tablet 0   pantoprazole  (PROTONIX ) 20 MG tablet TAKE 1 TABLET BY MOUTH EVERY DAY 90 tablet 1   potassium chloride  (KLOR-CON ) 10 MEQ tablet TAKE 1 TABLET BY MOUTH EVERY DAY 90 tablet 0   rosuvastatin  (CRESTOR ) 20 MG tablet Take 1 tablet (20 mg total) by mouth daily. 90 tablet 3   spironolactone  (ALDACTONE ) 25 MG tablet TAKE 1 TABLET (25 MG TOTAL) BY MOUTH DAILY. 90 tablet 1   topiramate  (TOPAMAX ) 100 MG tablet TAKE 1 TABLET BY MOUTH TWICE A DAY 180 tablet 1   Current Facility-Administered Medications  Medication Dose Route Frequency Provider Last Rate Last Admin   lidocaine  (XYLOCAINE ) 1 % (with pres) injection 1 mL  1 mL Other Once        lidocaine  (XYLOCAINE ) 1 % (with pres) injection 4 mL  4 mL Other Once        triamcinolone  acetonide (KENALOG -40) injection 40 mg  40 mg Intramuscular Once        triamcinolone  acetonide (KENALOG -40) injection 40 mg   40 mg Intra-articular Once         REVIEW OF SYSTEMS:   All other systems are reviewed and are negative for acute change except as noted in the HPI.  PHYSICAL EXAMINATION: ECOG PERFORMANCE STATUS: 0 - Asymptomatic  Vitals:   02/12/24 1241  BP: (!) 158/75  Pulse: 67  Resp: 16  Temp: (!) 97.5 F (36.4 C)  SpO2: 100%   Filed Weights   02/12/24 1241  Weight: 211 lb (95.7 kg)    Physical Exam Vitals reviewed.  Constitutional:      Appearance: She is not ill-appearing or toxic-appearing.  HENT:     Head: Normocephalic.     Nose: Nose normal.     Mouth/Throat:     Mouth: Mucous membranes are moist.  Eyes:     General: No scleral icterus.    Conjunctiva/sclera: Conjunctivae normal.  Cardiovascular:     Rate and Rhythm: Normal rate and regular rhythm.     Pulses: Normal pulses.     Heart sounds: Normal heart sounds.  Pulmonary:     Effort: Pulmonary effort is normal.     Breath sounds: Normal breath sounds.  Abdominal:     General: Bowel sounds are normal. There is no distension.     Palpations: Abdomen is soft. There is no mass.     Tenderness: There is no abdominal tenderness. There is no guarding or rebound.  Musculoskeletal:        General: Normal range of motion.  Skin:    General: Skin is warm.  Neurological:     Mental Status: She is alert and oriented to person, place, and time.  Psychiatric:        Mood and Affect: Mood normal.       LABORATORY DATA:  I  have reviewed the data as listed    Latest Ref Rng & Units 02/12/2024    1:55 PM 02/05/2024   11:53 AM 10/06/2023    2:37 AM  CBC  WBC 4.0 - 10.5 K/uL 8.6  8.3  9.1   Hemoglobin 12.0 - 15.0 g/dL 88.1  88.5  89.2   Hematocrit 36.0 - 46.0 % 38.9  39.3  35.6   Platelets 150 - 400 K/uL 186  186  161        Latest Ref Rng & Units 02/12/2024    1:55 PM 02/05/2024   11:53 AM 10/05/2023    2:21 PM  CMP  Glucose 70 - 99 mg/dL 84  80    BUN 8 - 23 mg/dL 11  12    Creatinine 9.55 - 1.00 mg/dL 9.18  9.17     Sodium 864 - 145 mmol/L 144  141  142   Potassium 3.5 - 5.1 mmol/L 4.2  3.9    Chloride 98 - 111 mmol/L 114  112    CO2 22 - 32 mmol/L 26  22    Calcium  8.9 - 10.3 mg/dL 9.6  9.3    Total Protein 6.5 - 8.1 g/dL 7.2  6.9    Total Bilirubin 0.0 - 1.2 mg/dL 0.4  0.5    Alkaline Phos 38 - 126 U/L 107  100    AST 15 - 41 U/L 20  19    ALT 0 - 44 U/L 20  18       RADIOGRAPHIC STUDIES: I have personally reviewed the radiological images as listed and agreed with the findings in the report. MR ABDOMEN WWO CONTRAST Result Date: 02/07/2024 CLINICAL DATA:  Pancreatic lesion EXAM: MRI ABDOMEN WITHOUT AND WITH CONTRAST TECHNIQUE: Multiplanar multisequence MR imaging of the abdomen was performed both before and after the administration of intravenous contrast. CONTRAST:  10 cc IV contrast from a single use bottle with 0 cc discarded. [employer]way  COMPARISON:  CT 02/05/2024.  Older exam as well FINDINGS: Lower chest: Lung bases are grossly clear. No significant pleural effusion. Hepatobiliary: Liver has some tiny bright T2, low T1 nonenhancing foci consistent with tiny cystic foci. There is 1 larger simple focus in segment 4a measuring 2.7 cm. Also a benign cystic lesion. No hypervascular or aggressive space-occupying liver lesion identified at this time. No restricted diffusion. No biliary ductal dilatation. Previous cholecystectomy. Patent portal vein. Pancreas: As seen on the prior examinations there is a focally severely dilated pancreatic duct towards the body and tail with an abrupt stricture in the midbody of the pancreas. There is a focal mass in this location best seen on the precontrast inphase T1 dataset measuring 17 by 15 mm on series 04-02, image 21 worrisome for pancreatic neoplasm. There is some subtle diffusion change in this location. In addition this abuts the course of the splenic vein with some narrowing. Please correlate for an adenocarcinoma. Lesion is not hypervascular. Spleen:  Within normal  limits in size and appearance. Adrenals/Urinary Tract: Adrenal glands are preserved. No enhancing renal mass or collecting system dilatation. Few punctate bright T2, low T1 nonenhancing foci are seen, likely benign cystic foci. No specific imaging follow-up. Stomach/Bowel: Visualized portions of small and large bowel in the abdomen have normal course and caliber. Few colonic diverticula. Stomach is nondilated. Vascular/Lymphatic: Aorta and IVC have normal course and caliber. No abnormal node enlargement identified in the visualized portions of the abdomen. Other: No free fluid. Surgical clips are identified posterior right hepatic  lobe with susceptibility artifact. Musculoskeletal: Scattered degenerative changes. Curvature of the spine. IMPRESSION: As seen on the prior CT there is a focal segment of pancreatic ductal dilatation with abrupt caliber change and stricture. Subtle mass lesion this location identified measuring 17 x 15 mm at the level of the caliber change and has appearance worrisome for neoplasm such as an adenocarcinoma. Recommend further evaluation. Lesion does abut the course of the splenic vein with some narrowing just proximal to the portal venous confluence. Few collateral vessels are identified. No other areas of malignant disease suggested. No abnormal lymph nodes. No liver mass. Benign hepatic and renal cystic foci.  Previous cholecystectomy. Findings will be called to the ordering service by the Radiology physician assistant team. Electronically Signed   By: Ranell Bring M.D.   On: 02/07/2024 11:02   CT ABDOMEN PELVIS W CONTRAST Result Date: 02/05/2024 CLINICAL DATA:  Hematuria, gross/macroscopic EXAM: CT ABDOMEN AND PELVIS WITH CONTRAST TECHNIQUE: Multidetector CT imaging of the abdomen and pelvis was performed using the standard protocol following bolus administration of intravenous contrast. RADIATION DOSE REDUCTION: This exam was performed according to the departmental dose-optimization  program which includes automated exposure control, adjustment of the mA and/or kV according to patient size and/or use of iterative reconstruction technique. CONTRAST:  OMNIPAQUE  IOHEXOL  300 MG/ML  SOLN COMPARISON:  February 04, 2024, October 04, 2023 FINDINGS: Lower chest: No focal airspace consolidation or pleural effusion. Hepatobiliary: The most superior aspect of the right hepatic dome was excluded from the field of view. A couple of scattered hepatic cysts present, unchanged.Focal fatty infiltration along the falciform ligament.Cholecystectomy. No intrahepatic or extrahepatic biliary ductal dilation. The portal veins are patent. Pancreas: Moderate dilation of the pancreatic duct in the midbody with overlying upstream body and tail atrophy. No peripancreatic inflammation or fluid collection. Spleen: Normal size. No mass. Adrenals/Urinary Tract: No adrenal masses. No renal mass. Small nonobstructive calculus in the lower pole of the left Key. No hydronephrosis. Circumferential wall thickening of the urinary bladder. Stomach/Bowel: The stomach is decompressed without focal abnormality. No small bowel wall thickening or inflammation. No small bowel obstruction.Normal appendix. Sigmoid colonic diverticulosis. No changes of acute diverticulitis. Vascular/Lymphatic: No aortic aneurysm. Diffuse aortoiliac atherosclerosis. No intraabdominal or pelvic lymphadenopathy. Reproductive: Hysterectomy. No concerning adnexal mass.No free pelvic fluid. Other: No pneumoperitoneum, ascites, or mesenteric inflammation. Musculoskeletal: No acute fracture or destructive lesion. Multilevel degenerative disc disease of the spine. Mild grade 1 anterolisthesis of L3 on L4. Thoracic DISH. IMPRESSION: 1. No acute intra-abdominal or pelvic abnormality. 2. Small nonobstructive calculus in the lower pole of the left Key. No hydronephrosis. 3. Moderate dilation of the pancreatic duct in the midbody and tail. Overlying parenchymal atrophy  is also present. This may be due to chronic pancreatitis or an obstructive neoplasm. A multiphase abdominal MRI with IV contrast is recommended for further characterization. No superimposed findings to suggest acute pancreatitis. Aortic Atherosclerosis (ICD10-I70.0). Electronically Signed   By: Rogelia Myers M.D.   On: 02/05/2024 13:54   US  RENAL Result Date: 02/04/2024 CLINICAL DATA:  Hematuria EXAM: RENAL / URINARY TRACT ULTRASOUND COMPLETE COMPARISON:  CT scan 10/04/2023 FINDINGS: Right Key: Renal measurements: 10.1 by 4.8 by 5.4 cm = volume: 135 mL. Echogenicity within normal limits. No mass or overt hydronephrosis; mild pyelectasis prevoid resolved after voiding. Left Key: Renal measurements: 10.0 by 5.3 by 4.9 cm = volume: 136 mL. Echogenicity within normal limits. No mass or hydronephrosis visualized. On the 10/04/2023 exam there was a 3 mm calculus in the  left Key lower pole, a shadowing calculus was not appreciated on today's ultrasound. Bladder: Appears normal for degree of bladder distention. Other: None. IMPRESSION: 1. No cause for hematuria is identified. 2. Mild right pyelectasis prevoid resolved after voiding. 3. On the 10/04/2023 exam there was a 3 mm calculus in the left Key lower pole. A shadowing calculus was not appreciated on today's ultrasound. Electronically Signed   By: Ryan Salvage M.D.   On: 02/04/2024 15:06    ASSESSMENT & PLAN Gina Key is a 78 y.o. female presenting to the [facility] for consultation regarding pancreatic mass. Differentials include primary pancreatic malignancy versus benign lesion Patient will proceed with laboratory workup today.   #Pancreatic mass - Reviewed CT AP and MR abdomen findings with patient and daughter. - Labs collected today include CBC, CMP, CA 19-9, CEA, chromogranin A - Urgent referral sent to GI for consult/EUS.  #Age related screenings - UTD  -Patient will RTC when work up is  complete.  Patient expressed understanding of the recommended workup and is agreeable to move forward.   All questions were answered. The patient knows to call the clinic with any problems, questions or concerns.  Shared visit with Dr. Federico  Orders Placed This Encounter  Procedures   Cancer antigen 19-9    Standing Status:   Future    Number of Occurrences:   1    Expected Date:   02/12/2024    Expiration Date:   02/11/2025   CBC with Differential (Cancer Center Only)    Standing Status:   Future    Number of Occurrences:   1    Expected Date:   02/12/2024    Expiration Date:   02/11/2025   CEA (Access)    Standing Status:   Future    Number of Occurrences:   1    Expected Date:   02/12/2024    Expiration Date:   02/11/2025   Chromogranin A    Standing Status:   Future    Number of Occurrences:   1    Expected Date:   02/12/2024    Expiration Date:   02/11/2025   CMP (Cancer Center only)    Standing Status:   Future    Number of Occurrences:   1    Expected Date:   02/12/2024    Expiration Date:   02/11/2025   Ambulatory referral to Gastroenterology    Referral Priority:   Urgent    Referral Type:   Consultation    Referral Reason:   Specialty Services Required    Referred to Provider:   Mansouraty, Aloha Raddle., MD    Number of Visits Requested:   1      I have spent a total of 60 minutes minutes of face-to-face and non-face-to-face time, preparing to see the patient, obtaining and/or reviewing separately obtained history, performing a medically appropriate examination, counseling and educating the patient, ordering medications/tests/procedures, referring and communicating with other health care professionals, documenting clinical information in the electronic health record, independently interpreting results and communicating results to the patient, and care coordination.   Gina Opie Walisiewicz PA-C Department of Hematology/Oncology [facility] at [facility] Phone: [phone]  I have read the above note and personally examined the patient. I agree with the assessment and plan as noted above.  Briefly Mrs. Macbeth is a 78 year old female who presents for evaluation of a pancreatic lesion.  The patient underwent a CT scan of the abdomen  and pelvis on 02/05/2024 due to hematuria.  As part of that evaluation there was noted to be moderate dilation of the pancreatic duct in the mid body and tail.  Obstructive neoplasm could not be included an MRI was recommended.  MRI was performed on 02/07/2024.  That lesion was found to be 17 x 15 mm worrisome for a neoplasm such as adenocarcinoma.  Due to concern for this finding the patient was referred to rapid diagnostic clinic for further evaluation and management.  The patient has been otherwise asymptomatic with no abdominal discomfort or nausea, vomiting, or diarrhea.  At this time recommend referral to gastroenterology for consideration of an EUS with biopsy.  The patient voiced understanding of our findings and recommendations moving forward.   Gina IVAR Kidney, MD Department of Hematology/Oncology [facility] at [facility] Phone: [phone] Pager: [phone] Email: Gina Key

## 2024-02-12 ENCOUNTER — Encounter: Payer: Self-pay | Admitting: Medical Oncology

## 2024-02-12 ENCOUNTER — Inpatient Hospital Stay: Attending: Physician Assistant | Admitting: Physician Assistant

## 2024-02-12 ENCOUNTER — Telehealth: Payer: Self-pay | Admitting: Gastroenterology

## 2024-02-12 ENCOUNTER — Inpatient Hospital Stay

## 2024-02-12 VITALS — BP 158/75 | HR 67 | Temp 97.5°F | Resp 16 | Ht 62.0 in | Wt 211.0 lb

## 2024-02-12 DIAGNOSIS — D378 Neoplasm of uncertain behavior of other specified digestive organs: Secondary | ICD-10-CM | POA: Diagnosis not present

## 2024-02-12 DIAGNOSIS — K869 Disease of pancreas, unspecified: Secondary | ICD-10-CM | POA: Diagnosis present

## 2024-02-12 DIAGNOSIS — K8689 Other specified diseases of pancreas: Secondary | ICD-10-CM

## 2024-02-12 LAB — CMP (CANCER CENTER ONLY)
ALT: 20 U/L (ref 0–44)
AST: 20 U/L (ref 15–41)
Albumin: 4.2 g/dL (ref 3.5–5.0)
Alkaline Phosphatase: 107 U/L (ref 38–126)
Anion gap: 4 — ABNORMAL LOW (ref 5–15)
BUN: 11 mg/dL (ref 8–23)
CO2: 26 mmol/L (ref 22–32)
Calcium: 9.6 mg/dL (ref 8.9–10.3)
Chloride: 114 mmol/L — ABNORMAL HIGH (ref 98–111)
Creatinine: 0.81 mg/dL (ref 0.44–1.00)
GFR, Estimated: >60 mL/min (ref >60)
Glucose, Bld: 84 mg/dL (ref 70–99)
Potassium: 4.2 mmol/L (ref 3.5–5.1)
Sodium: 144 mmol/L (ref 135–145)
Total Bilirubin: 0.4 mg/dL (ref 0.0–1.2)
Total Protein: 7.2 g/dL (ref 6.5–8.1)

## 2024-02-12 LAB — CBC WITH DIFFERENTIAL (CANCER CENTER ONLY)
Abs Immature Granulocytes: 0.02 K/uL (ref 0.00–0.07)
Basophils Absolute: 0 K/uL (ref 0.0–0.1)
Basophils Relative: 1 %
Eosinophils Absolute: 0.1 K/uL (ref 0.0–0.5)
Eosinophils Relative: 1 %
HCT: 38.9 % (ref 36.0–46.0)
Hemoglobin: 11.8 g/dL — ABNORMAL LOW (ref 12.0–15.0)
Immature Granulocytes: 0 %
Lymphocytes Relative: 32 %
Lymphs Abs: 2.7 K/uL (ref 0.7–4.0)
MCH: 26.6 pg (ref 26.0–34.0)
MCHC: 30.3 g/dL (ref 30.0–36.0)
MCV: 87.6 fL (ref 80.0–100.0)
Monocytes Absolute: 0.4 K/uL (ref 0.1–1.0)
Monocytes Relative: 5 %
Neutro Abs: 5.3 K/uL (ref 1.7–7.7)
Neutrophils Relative %: 61 %
Platelet Count: 186 K/uL (ref 150–400)
RBC: 4.44 MIL/uL (ref 3.87–5.11)
RDW: 13.1 % (ref 11.5–15.5)
WBC Count: 8.6 K/uL (ref 4.0–10.5)
nRBC: 0 % (ref 0.0–0.2)

## 2024-02-12 LAB — CEA (ACCESS): CEA (CHCC): <1 ng/mL (ref 0.00–5.00)

## 2024-02-12 NOTE — Progress Notes (Signed)
 [facility]  Patient presented to clinic, with [name] Schaffer, for her scheduled appointment with DEVONNA Gift. I introduced myself and provided them with my direct contact information. Patient and Mrs. Morehead were both encouraged to call me with any questions/concerns they may have.  Colene KYM Raider, RN, BSN, [facility] Oncology Nurse Navigator, [facility] 02/12/2024 1:32 PM

## 2024-02-12 NOTE — Telephone Encounter (Signed)
 Dr. Wilhel[place]ia,  Urgent referral in [place] for pancreatic mass.  Patient previously was established with Dr. Eda.  Please advise?  Thanks [employer]

## 2024-02-12 NOTE — Patient Instructions (Addendum)
 Diagnostic Clinic Office Visit Discharge Information and Instructions  Thank you for choosing [facility] for your healthcare needs.  Below is a summary of today's discussion, along with our contact information and an outline of what to expect next.  Reason for Visit:  pancreatic mass  Proposed Diagnostic Care Plan: Labs collected today Urgent GI referral sent. The office will contact you to schedule an appointment. Please contact Colene if you have any difficulty getting an appointment.    What to Expect: - Generally, when lab tests are ordered the results can take up to 1 week for results to be available.  At that point, we will contact you to discuss your results with you.  Unless there is a critical result, we will typically wait for all of your lab results to be available before contacting you. - If a biopsy is part of your Care Plan, those results can take on average 7-10 days to result.  Once results are available, we will contact you to discuss your pathology results and any next steps. - If you have additional imaging ordered, such as a CT Scan, MRI, Ultrasound, Bone Scan, or PET scan, your imaging will need to be authorized then scheduled with the earliest available appointment.  You may be asked to travel to another hospital within [facility] who has a sooner availability, please consider doing so if asked. - If you use MyChart, your results will be available to you in the MyChart portal.  Your provider will be in touch with you as soon as all of your results are available to be discussed.  Your Diagnostic Clinic Provider:  Odel Schmid Walisiewicz PA-C and Dr. Federico Your Diagnostic Navigator:  Colene Raider RN, office number [phone]  If you or your caregiver have number blocking on your cell phones, please ensure the cancer center's numbers are not blocked.  If you are not a registered MyChart user, please consider enrolling in MyChart to receive your test results and visit  notes.  You can also access your discharge instructions electronically.  MyChart also gives you an electronic means to communicate with your Care Team instead of needing to call in to the cancer center.  We appreciate you trusting us  with your healthcare and look forward to partnering with you as we work to uncover what your potential diagnosis may be.  Please do not hesitate to reach out at any point with questions or concerns.

## 2024-02-13 ENCOUNTER — Other Ambulatory Visit: Payer: Self-pay

## 2024-02-13 ENCOUNTER — Encounter: Payer: Self-pay | Admitting: Medical Oncology

## 2024-02-13 DIAGNOSIS — K8689 Other specified diseases of pancreas: Secondary | ICD-10-CM

## 2024-02-13 NOTE — Telephone Encounter (Signed)
 EUS scheduled, pt instructed and medications reviewed.  Patient instructions mailed to home.  Patient to call with any questions or concerns.   The pt daughter aware to reach out if they have not heard from us  in regards to the Eliquis .

## 2024-02-13 NOTE — Telephone Encounter (Signed)
 EUS set up for 03/03/24 at 345 pm at [facility] with GM   Anti coag has been sent to the PCP

## 2024-02-13 NOTE — Telephone Encounter (Signed)
 Gina Key, Patient need Upper EUS for possible malignancy. Let's look at 8/11 there is a spot I see @1551 . If we have an opening sooner, then we can work on getting her in. Thanks. GM

## 2024-02-14 ENCOUNTER — Telehealth: Payer: Self-pay | Admitting: Physician Assistant

## 2024-02-14 LAB — CHROMOGRANIN A: Chromogranin A (ng/mL): 224.8 ng/mL — ABNORMAL HIGH (ref 0.0–101.8)

## 2024-02-14 LAB — CANCER ANTIGEN 19-9: CA 19-9: 8 U/mL (ref 0–35)

## 2024-02-14 NOTE — Telephone Encounter (Signed)
 I notified Gina Key and her daughter by phone regarding lab results. Labs show chromogranin A is elevated to 224.8. Patient is scheduled for an EUS with Dr. Wilhelmenia on 03/03/24. All of patient's questions were answered and they expressed understanding of the plan provided.

## 2024-02-14 NOTE — Telephone Encounter (Signed)
 Brien Rea LABOR, CMA  Ethyl Kirsch, RN; Mercer Kirsch SAUNDERS, MD Good morning,  This patient has a GI procedure, and she is on Anticoagulation therapy. GI is asking to advise how long the patient may come off her therapy prior to the procedure on 03/03/24.   DOAC management around surgery from the [facility];   Current CrCl for this pt is 62.57mL/min using adjusted wt due to actual wt being >125% of ideal wt. This surgery would be considered high bleed risk.  Per ACC recommendations, apixaban  should be stopped >48 hours in advance of the surgery and surgeon should advise pt on restarting DOAC after surgery, per bleeding risk after surgery.

## 2024-02-15 ENCOUNTER — Ambulatory Visit: Admitting: Physical Medicine & Rehabilitation

## 2024-02-17 ENCOUNTER — Other Ambulatory Visit: Payer: Self-pay

## 2024-02-17 ENCOUNTER — Emergency Department (HOSPITAL_BASED_OUTPATIENT_CLINIC_OR_DEPARTMENT_OTHER)
Admission: EM | Admit: 2024-02-17 | Discharge: 2024-02-17 | Disposition: A | Discharge status: Home / Self Care | Attending: Emergency Medicine | Admitting: Emergency Medicine

## 2024-02-17 ENCOUNTER — Encounter (HOSPITAL_BASED_OUTPATIENT_CLINIC_OR_DEPARTMENT_OTHER): Payer: Self-pay

## 2024-02-17 DIAGNOSIS — Z7901 Long term (current) use of anticoagulants: Secondary | ICD-10-CM | POA: Diagnosis not present

## 2024-02-17 DIAGNOSIS — R319 Hematuria, unspecified: Secondary | ICD-10-CM | POA: Diagnosis present

## 2024-02-17 DIAGNOSIS — N3091 Cystitis, unspecified with hematuria: Secondary | ICD-10-CM | POA: Diagnosis not present

## 2024-02-17 DIAGNOSIS — Z7982 Long term (current) use of aspirin: Secondary | ICD-10-CM | POA: Insufficient documentation

## 2024-02-17 DIAGNOSIS — N3001 Acute cystitis with hematuria: Secondary | ICD-10-CM | POA: Diagnosis not present

## 2024-02-17 DIAGNOSIS — N3090 Cystitis, unspecified without hematuria: Secondary | ICD-10-CM

## 2024-02-17 LAB — CBC WITH DIFFERENTIAL/PLATELET
Abs Immature Granulocytes: 0.02 K/uL (ref 0.00–0.07)
Basophils Absolute: 0 K/uL (ref 0.0–0.1)
Basophils Relative: 1 %
Eosinophils Absolute: 0.1 K/uL (ref 0.0–0.5)
Eosinophils Relative: 2 %
HCT: 35.2 % — ABNORMAL LOW (ref 36.0–46.0)
Hemoglobin: 10.6 g/dL — ABNORMAL LOW (ref 12.0–15.0)
Immature Granulocytes: 0 %
Lymphocytes Relative: 32 %
Lymphs Abs: 2.5 K/uL (ref 0.7–4.0)
MCH: 26.4 pg (ref 26.0–34.0)
MCHC: 30.1 g/dL (ref 30.0–36.0)
MCV: 87.8 fL (ref 80.0–100.0)
Monocytes Absolute: 0.4 K/uL (ref 0.1–1.0)
Monocytes Relative: 5 %
Neutro Abs: 4.9 K/uL (ref 1.7–7.7)
Neutrophils Relative %: 60 %
Platelets: 188 K/uL (ref 150–400)
RBC: 4.01 MIL/uL (ref 3.87–5.11)
RDW: 13.2 % (ref 11.5–15.5)
WBC: 8 K/uL (ref 4.0–10.5)
nRBC: 0 % (ref 0.0–0.2)

## 2024-02-17 LAB — URINALYSIS, W/ REFLEX TO CULTURE (INFECTION SUSPECTED)
Bilirubin Urine: NEGATIVE
Glucose, UA: NEGATIVE mg/dL
Ketones, ur: NEGATIVE mg/dL
Nitrite: POSITIVE — AB
Protein, ur: 30 mg/dL — AB
RBC / HPF: >50 RBC/hpf (ref 0–5)
Specific Gravity, Urine: 1.025 (ref 1.005–1.030)
WBC, UA: >50 WBC/hpf (ref 0–5)
pH: 6 (ref 5.0–8.0)

## 2024-02-17 LAB — COMPREHENSIVE METABOLIC PANEL WITH GFR
ALT: 17 U/L (ref 0–44)
AST: 20 U/L (ref 15–41)
Albumin: 4.1 g/dL (ref 3.5–5.0)
Alkaline Phosphatase: 117 U/L (ref 38–126)
Anion gap: 7 (ref 5–15)
BUN: 14 mg/dL (ref 8–23)
CO2: 23 mmol/L (ref 22–32)
Calcium: 9.3 mg/dL (ref 8.9–10.3)
Chloride: 114 mmol/L — ABNORMAL HIGH (ref 98–111)
Creatinine, Ser: 0.93 mg/dL (ref 0.44–1.00)
GFR, Estimated: >60 mL/min (ref >60)
Glucose, Bld: 104 mg/dL — ABNORMAL HIGH (ref 70–99)
Potassium: 4.1 mmol/L (ref 3.5–5.1)
Sodium: 145 mmol/L (ref 135–145)
Total Bilirubin: 0.2 mg/dL (ref 0.0–1.2)
Total Protein: 6.4 g/dL — ABNORMAL LOW (ref 6.5–8.1)

## 2024-02-17 LAB — PROTIME-INR
INR: 1.1 (ref 0.8–1.2)
Prothrombin Time: 15.3 s — ABNORMAL HIGH (ref 11.4–15.2)

## 2024-02-17 MED ORDER — FOSFOMYCIN TROMETHAMINE 3 G PO PACK
3.0000 g | PACK | Freq: Once | ORAL | Status: AC
  Administered 2024-02-17: 3 g via ORAL
  Filled 2024-02-17: qty 3

## 2024-02-17 MED ORDER — CEFDINIR 300 MG PO CAPS
300.0000 mg | ORAL_CAPSULE | Freq: Two times a day (BID) | ORAL | 0 refills | Status: AC
Start: 2024-02-17 — End: 2024-02-27

## 2024-02-17 NOTE — ED Triage Notes (Signed)
 Pt states she went to the urinated twice tonight & both times her urine had bright red blood. Pt denies frequency, urgency, pain. Pt states she feels completely normal just saw the blood & needed to be checked.

## 2024-02-17 NOTE — ED Notes (Signed)
 Assisted pt to restroom for urine specimen. Sample was yellow & clear. No hematuria observed.

## 2024-02-17 NOTE — ED Provider Notes (Signed)
 [facility] Provider Note   CSN: [id number] Arrival date & time: 02/17/24  9473     Patient presents with: Hematuria   Gina Key is a 78 y.o. female.  {Add pertinent medical, surgical, social history, OB history to HPI:789} 74-year-old female presents ER today with hematuria.  Patient said issues with this on and off over the last couple months.  Multiple times she has had it with no infection the most recently on the 15th she did have an infection on the culture.  Thankfully she has been started on Omnicef .  Her hematuria improved the next day and was clear until last night she had an episode and then again this morning she had another episode so she presents here for further evaluation.  She is also found to have a kidney stone but had not migrated in her ureter yet.  She denies any pain this morning.  She has no back pain, fever, nausea, vomiting or any other associated symptoms.  Has not seen the urologist yet.  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  No bleeding anywhere else.  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   Hematuria       Prior to Admission medications   Medication Sig Start Date End Date Taking? Authorizing Provider  apixaban  (ELIQUIS ) 5 MG TABS tablet Take 1 tablet (5 mg total) by mouth 2 (two) times daily. 10/10/23   [name], MD  APIXABAN  (ELIQUIS ) VTE STARTER PACK (10MG  AND 5MG ) Take as directed on package: start with two-5mg  tablets twice daily for 7 days. On day 8, switch to one-5mg  tablet twice daily. 10/06/23   [place], Camellia PARAS, DO  aspirin 325 MG EC tablet Take 325 mg by mouth daily.    [provider]  Fexofenadine HCl (ALLEGRA PO) Take by mouth.    [provider]  Incontinence Supply Disposable (WINGS HL ADULT BRIEFS/XL) MISC As needed daily. 09/27/23   [name], MD  ipratropium  (ATROVENT ) 0.06 % nasal spray PLACE 2 SPRAYS INTO BOTH NOSTRILS 4 TIMES DAILY. 11/30/23   [name], MD  levETIRAcetam  (KEPPRA ) 500 MG tablet Take 1 tablet (500 mg total) by mouth 2 (two) times daily. 12/04/23   Gayland Lauraine PARAS, NP  lidocaine  (HM LIDOCAINE  PATCH) 4 % Place 1 patch onto the skin daily as needed.    [provider]  losartan  (COZAAR ) 100 MG tablet TAKE 1 TABLET BY MOUTH EVERY DAY 11/30/23   [name], MD  metoprolol  succinate (TOPROL -XL) 25 MG 24 hr tablet TAKE 1 TABLET (25 MG TOTAL) BY MOUTH DAILY. 07/06/23   [name], MD  montelukast  (SINGULAIR ) 10 MG tablet TAKE 1 TABLET BY MOUTH EVERYDAY AT BEDTIME 07/06/23   [name], MD  Oxycodone  HCl 10 MG TABS Take 1 tablet (10 mg total) by mouth every 12 (twelve) hours as needed. 01/18/24   Urbano Albright, MD  pantoprazole  (PROTONIX ) 20 MG tablet TAKE 1 TABLET BY MOUTH EVERY DAY 10/15/23   [name], MD  potassium chloride  (KLOR-CON ) 10 MEQ tablet TAKE 1 TABLET BY MOUTH EVERY DAY 10/31/23   [name], MD  rosuvastatin  (CRESTOR ) 20 MG tablet Take 1 tablet (20 mg total) by mouth daily. 12/07/23   [name], MD  spironolactone  (ALDACTONE ) 25 MG tablet TAKE 1 TABLET (25 MG TOTAL) BY MOUTH DAILY.  07/06/23   [name], MD  topiramate  (TOPAMAX ) 100 MG tablet TAKE 1 TABLET BY MOUTH TWICE A DAY 11/30/23   [name], MD    Allergies: Patient has no known allergies.    Review of Systems  Genitourinary:  Positive for hematuria.    Updated Vital Signs BP (!) 146/68 (BP Location: Right Arm)   Pulse 70   Temp 98.4 F (36.9 C) (Oral)   Resp 16   Ht 5' 2 (1.575 m)   Wt 93.9 kg   SpO2 95%   BMI 37.86 kg/m   Physical Exam Vitals and nursing note reviewed.  Constitutional:      Appearance: She is well-developed.  HENT:     Head: Normocephalic and atraumatic.  Cardiovascular:     Rate and Rhythm: Normal rate and regular rhythm.  Pulmonary:     Effort: No respiratory distress.      Breath sounds: No stridor.  Abdominal:     General: There is no distension.  Musculoskeletal:        General: No swelling or tenderness. Normal range of motion.     Cervical back: Normal range of motion.  Skin:    General: Skin is warm and dry.  Neurological:     General: No focal deficit present.     Mental Status: She is alert.     (all labs ordered are listed, but only abnormal results are displayed) Labs Reviewed  CBC WITH DIFFERENTIAL/PLATELET  COMPREHENSIVE METABOLIC PANEL WITH GFR  PROTIME-INR  URINALYSIS, W/ REFLEX TO CULTURE (INFECTION SUSPECTED)    EKG: None  Radiology: No results found.  {Document cardiac monitor, telemetry assessment procedure when appropriate:32947} Procedures   Medications Ordered in the ED - No data to display    {Click here for ABCD2, HEART and other calculators REFRESH Note before signing:1}                              Medical Decision Making Amount and/or Complexity of Data Reviewed Labs: ordered.   Recurrent hematuria 2 days after stopping antibiotics.  Discussed with pharmacy and will likely give one-time dose of fosfomycin here and then another week of antibiotics at home.  Desperately needs urology follow-up to get to the bottom of this.  Will check her labs to make sure INR is okay make sure kidney functions okay.  Do not see any indication for repeat imaging is I have low suspicion for ureterolithiasis, renal cell carcinoma, bladder cancer or pyelonephritis based on lack of pain or abnormal vital signs.  {Document critical care time when appropriate  Document review of labs and clinical decision tools ie CHADS2VASC2, etc  Document your independent review of radiology images and any outside records  Document your discussion with family members, caretakers and with consultants  Document social determinants of health affecting pt's care  Document your decision making why or why not admission, treatments were needed:32947:::1}    Final diagnoses:  None    ED Discharge Orders     None

## 2024-02-18 ENCOUNTER — Ambulatory Visit: Payer: Self-pay

## 2024-02-18 NOTE — Telephone Encounter (Signed)
 Pt daughter calling to schedule ED f/u. Pt and daughter deny worsening s/s. Pt scheduled.   Copied from CRM [id number]. Topic: Clinical - Red Word Triage >> Feb 18, 2024  9:23 AM Precious C wrote: [facility] that prompted transfer to Nurse Triage: BLOOD IN URINE  Patient's daughter called on behalf of her mother to follow up regarding blood in her urine. While attempting to schedule an appointment, the system prompted a denial and directed that the request be sent to nurse triage. Routed accordingly per protocol.

## 2024-02-19 LAB — URINE CULTURE: Culture: >=100000 — AB

## 2024-02-19 NOTE — Telephone Encounter (Signed)
 Called and spoke with urology to see if we could get he/r a sooner appt, patient was placed on the wait list, appt is 8/13, patient does not need to keep appt 8/1

## 2024-02-20 ENCOUNTER — Ambulatory Visit (INDEPENDENT_AMBULATORY_CARE_PROVIDER_SITE_OTHER): Payer: Medicare HMO

## 2024-02-20 ENCOUNTER — Telehealth: Payer: Self-pay

## 2024-02-20 ENCOUNTER — Telehealth (HOSPITAL_BASED_OUTPATIENT_CLINIC_OR_DEPARTMENT_OTHER): Payer: Self-pay | Admitting: *Deleted

## 2024-02-20 VITALS — Ht 62.0 in | Wt 211.0 lb

## 2024-02-20 DIAGNOSIS — Z0000 Encounter for general adult medical examination without abnormal findings: Secondary | ICD-10-CM

## 2024-02-20 NOTE — Telephone Encounter (Signed)
 Anti coag letter resent to PCP Dr Mercer in basket and fax

## 2024-02-20 NOTE — Telephone Encounter (Signed)
 Post ED Visit - Positive Culture Follow-up  Culture report reviewed by antimicrobial stewardship pharmacist: [facility] [x]  Leonor Bash Pharm.D. []  Venetia Gully, Pharm.D., BCPS AQ-ID []  Garrel Crews, Pharm.D., BCPS []  Almarie Lunger, Pharm.D., BCPS []  [place], [place].D., BCPS, AAHIVP []  Rosaline Bihari, Pharm.D., BCPS, AAHIVP []  Vernell Meier, PharmD, BCPS []  Latanya Hint, PharmD, BCPS []  Donald Medley, PharmD, BCPS []  Rocky Bold, PharmD []  Dorothyann Alert, PharmD, BCPS []  Morene Babe, PharmD  [facility] []  Rosaline Edison, PharmD []  Romona Bliss, PharmD []  Dolphus Roller, PharmD []  Veva Seip, Rph []  Vernell Daunt) Leonce, PharmD []  Eva Allis, PharmD []  Rosaline Millet, PharmD []  Iantha Batch, PharmD []  Arvin Gauss, PharmD []  Wanda Hasting, PharmD []  Ronal Rav, PharmD []  Rocky Slade, PharmD []  Bard Jeans, PharmD   Positive urine culture Treated with Cefdinir  Given Fosomycin in ED. Spoke to patient and daughter and patient denies any worsening of symptoms. She feeling better and is improving. and no further patient follow-up is required at this time.  Jama Wyman Kipper 02/20/2024, 11:20 AM

## 2024-02-20 NOTE — Telephone Encounter (Signed)
-----   Message from Nurse Carmeline Kowal P sent at 02/13/2024 12:52 PM EDT ----- Make sure to have Eliquis  hold

## 2024-02-20 NOTE — Patient Instructions (Addendum)
 Gina Key , Thank you for taking time out of your busy schedule to complete your Annual Wellness Visit with me. I enjoyed our conversation and look forward to speaking with you again next year. I, as well as your care team,  appreciate your ongoing commitment to your health goals. Please review the following plan we discussed and let me know if I can assist you in the future. Your Game plan/ To Do List    Referrals: If you haven't heard from the office you've been referred to, please reach out to them at the phone provided.   Follow up Visits: Next Medicare AWV with our clinical staff: 02/25/25 @ 1:40p   Have you seen your provider in the last 6 months (3 months if uncontrolled diabetes)?  Next Office Visit with your provider: 02/22/24 @ 8a  Clinician Recommendations:  Aim for 30 minutes of exercise or brisk walking, 6-8 glasses of water, and 5 servings of fruits and vegetables each day.       This is a list of the screening recommended for you and due dates:  Health Maintenance  Topic Date Due   COVID-19 Vaccine (8 - Moderna risk 2024-25 season) 02/22/2024*   DTaP/Tdap/Td vaccine (1 - Tdap) 10/02/2024*   Pneumococcal Vaccine for age over 18 (1 of 2 - PCV) 02/05/2025*   Flu Shot  02/22/2024   Medicare Annual Wellness Visit  02/19/2025   DEXA scan (bone density measurement)  Completed   Hepatitis C Screening  Completed   Zoster (Shingles) Vaccine  Completed   Hepatitis B Vaccine  Aged Out   HPV Vaccine  Aged Out   Meningitis B Vaccine  Aged Out   Colon Cancer Screening  Discontinued  *Topic was postponed. The date shown is not the original due date.   Opioid Pain Medicine Management Opioids are powerful medicines that are used to treat moderate to severe pain. When used for short periods of time, they can help you to: Sleep better. Do better in physical or occupational therapy. Feel better in the first few days after an injury. Recover from surgery. Opioids should be taken with the  supervision of a trained health care provider. They should be taken for the shortest period of time possible. This is because opioids can be addictive, and the longer you take opioids, the greater your risk of addiction. This addiction can also be called opioid use disorder. What are the risks? Using opioid pain medicines for longer than 3 days increases your risk of side effects. Side effects include: Constipation. Nausea and vomiting. Breathing difficulties (respiratory depression). Drowsiness. Confusion. Opioid use disorder. Itching. Taking opioid pain medicine for a long period of time can affect your ability to do daily tasks. It also puts you at risk for: Motor vehicle crashes. Depression. Suicide. Heart attack. Overdose, which can be life-threatening. What is a pain treatment plan? A pain treatment plan is an agreement between you and your health care provider. Pain is unique to each person, and treatments vary depending on your condition. To manage your pain, you and your health care provider need to work together. To help you do this: Discuss the goals of your treatment, including how much pain you might expect to have and how you will manage the pain. Review the risks and benefits of taking opioid medicines. Remember that a good treatment plan uses more than one approach and minimizes the chance of side effects. Be honest about the amount of medicines you take and about any drug  or alcohol use. Get pain medicine prescriptions from only one health care provider. Pain can be managed with many types of alternative treatments. Ask your health care provider to refer you to one or more specialists who can help you manage pain through: Physical or occupational therapy. Counseling (cognitive behavioral therapy). Good nutrition. Biofeedback. Massage. Meditation. Non-opioid medicine. Following a gentle exercise program. How to use opioid pain medicine Taking medicine Take your pain  medicine exactly as told by your health care provider. Take it only when you need it. If your pain gets less severe, you may take less than your prescribed dose if your health care provider approves. If you are not having pain, do nottake pain medicine unless your health care provider tells you to take it. If your pain is severe, do nottry to treat it yourself by taking more pills than instructed on your prescription. Contact your health care provider for help. Write down the times when you take your pain medicine. It is easy to become confused while on pain medicine. Writing the time can help you avoid overdose. Take other over-the-counter or prescription medicines only as told by your health care provider. Keeping yourself and others safe  While you are taking opioid pain medicine: Do not drive, use machinery, or power tools. Do not sign legal documents. Do not drink alcohol. Do not take sleeping pills. Do not supervise children by yourself. Do not do activities that require climbing or being in high places. Do not go to a lake, river, ocean, spa, or swimming pool. Do not share your pain medicine with anyone. Keep pain medicine in a locked cabinet or in a secure area where pets and children cannot reach it. Stopping your use of opioids If you have been taking opioid medicine for more than a few weeks, you may need to slowly decrease (taper) how much you take until you stop completely. Tapering your use of opioids can decrease your risk of symptoms of withdrawal, such as: Pain and cramping in the abdomen. Nausea. Sweating. Sleepiness. Restlessness. Uncontrollable shaking (tremors). Cravings for the medicine. Do not attempt to taper your use of opioids on your own. Talk with your health care provider about how to do this. Your health care provider may prescribe a step-down schedule based on how much medicine you are taking and how long you have been taking it. Getting rid of leftover  pills Do not save any leftover pills. Get rid of leftover pills safely by: Taking the medicine to a prescription take-back program. This is usually offered by the county or law enforcement. Bringing them to a pharmacy that has a drug disposal container. Flushing them down the toilet. Check the label or package insert of your medicine to see whether this is safe to do. Throwing them out in the trash. Check the label or package insert of your medicine to see whether this is safe to do. If it is safe to throw it out, remove the medicine from the original container, put it into a sealable bag or container, and mix it with used coffee grounds, food scraps, dirt, or cat litter before putting it in the trash. Follow these instructions at home: Activity Do exercises as told by your health care provider. Avoid activities that make your pain worse. Return to your normal activities as told by your health care provider. Ask your health care provider what activities are safe for you. General instructions You may need to take these actions to prevent or treat constipation: Drink  enough fluid to keep your urine pale yellow. Take over-the-counter or prescription medicines. Eat foods that are high in fiber, such as beans, whole grains, and fresh fruits and vegetables. Limit foods that are high in fat and processed sugars, such as fried or sweet foods. Keep all follow-up visits. This is important. Where to find support If you have been taking opioids for a long time, you may benefit from receiving support for quitting from a local support group or counselor. Ask your health care provider for a referral to these resources in your area. Where to find more information Centers for Disease Control and Prevention (CDC): FootballExhibition.com.br [place] Food and Drug Administration (FDA): PumpkinSearch.com.ee Get help right away if: You may have taken too much of an opioid (overdosed). Common symptoms of an overdose: Your breathing is  slower or more shallow than normal. You have a very slow heartbeat (pulse). You have slurred speech. You have nausea and vomiting. Your pupils become very small. You have other potential symptoms: You are very confused. You faint or feel like you will faint. You have cold, clammy skin. You have blue lips or fingernails. You have thoughts of harming yourself or harming others. These symptoms may represent a serious problem that is an emergency. Do not wait to see if the symptoms will go away. Get medical help right away. Call your local emergency services (911 in the [place] Do not drive yourself to the hospital.  If you ever feel like you may hurt yourself or others, or have thoughts about taking your own life, get help right away. Go to your nearest emergency department or: Call your local emergency services (911 in the [place] Call the [facility] ((601)661-1034 in the [place] Call a suicide crisis helpline, such as the National Suicide Prevention Lifeline at (941) 595-7926 or 988 in the [place] This is open 24 hours a day in the [place] If you're a Veteran: Call 988 and press 1. This is open 24 hours a day. Text the [employer] at 787-193-7445. Summary Opioid medicines can help you manage moderate to severe pain for a short period of time. A pain treatment plan is an agreement between you and your health care provider. Discuss the goals of your treatment, including how much pain you might expect to have and how you will manage the pain. If you think that you or someone else may have taken too much of an opioid, get medical help right away. This information is not intended to replace advice given to you by your health care provider. Make sure you discuss any questions you have with your health care provider. Document Revised: 04/16/2023 Document Reviewed: 10/20/2020 Elsevier Patient Education  2024 Elsevier Inc. Advanced directives: (Declined) Advance directive discussed  with you today. Even though you declined this today, please call our office should you change your mind, and we can give you the proper paperwork for you to fill out. Advance Care Planning is important because it:  [x]  Makes sure you receive the medical care that is consistent with your values, goals, and preferences  [x]  It provides guidance to your family and loved ones and reduces their decisional burden about whether or not they are making the right decisions based on your wishes.  Follow the link provided in your after visit summary or read over the paperwork we have mailed to you to help you started getting your Advance Directives in place. If you need assistance in completing these, please reach out to us   so that we can help you!  See attachments for Preventive Care and Fall Prevention Tips.

## 2024-02-20 NOTE — Progress Notes (Signed)
 Subjective:   Gina Key is a 78 y.o. who presents for a Medicare Wellness preventive visit.  As a reminder, Annual Wellness Visits don't include a physical exam, and some assessments may be limited, especially if this visit is performed virtually. We may recommend an in-person follow-up visit with your provider if needed.  Visit Complete: Virtual I connected with  [name] on 02/20/24 by a audio enabled telemedicine application and verified that I am speaking with the correct person using two identifiers.  Patient Location: Home  Provider Location: Home Office  I discussed the limitations of evaluation and management by telemedicine. The patient expressed understanding and agreed to proceed.  Vital Signs: Because this visit was a virtual/telehealth visit, some criteria may be missing or patient reported. Any vitals not documented were not able to be obtained and vitals that have been documented are patient reported.    Persons Participating in Visit: Patient.  AWV Questionnaire: No: Patient Medicare AWV questionnaire was not completed prior to this visit.  Cardiac Risk Factors include: advanced age (>55men, >78 women);hypertension     Objective:    Today's Vitals   02/20/24 1347  Weight: 211 lb (95.7 kg)  Height: 5' 2 (1.575 m)  PainSc: 0-No pain   Body mass index is 38.59 kg/m.     02/20/2024    1:59 PM 02/17/2024    5:50 AM 02/05/2024   11:07 AM 10/05/2023    2:00 PM 03/16/2023   12:54 PM 02/16/2023    3:19 PM 02/13/2022    4:05 PM  Advanced Directives  Does Patient Have a Medical Advance Directive? No No No No No No No  Would patient like information on creating a medical advance directive? No - Patient declined   No - Patient declined  No - Patient declined No - Patient declined    Current Medications (verified) Outpatient Encounter Medications as of 02/20/2024  Medication Sig   apixaban  (ELIQUIS ) 5 MG TABS tablet Take 1 tablet (5 mg total) by  mouth 2 (two) times daily.   [Paused] aspirin 325 MG EC tablet Take 325 mg by mouth daily.   cefdinir  (OMNICEF ) 300 MG capsule Take 1 capsule (300 mg total) by mouth 2 (two) times daily for 10 days.   Fexofenadine HCl (ALLEGRA PO) Take by mouth.   Incontinence Supply Disposable (WINGS HL ADULT BRIEFS/XL) MISC As needed daily.   ipratropium (ATROVENT ) 0.06 % nasal spray PLACE 2 SPRAYS INTO BOTH NOSTRILS 4 TIMES DAILY.   levETIRAcetam  (KEPPRA ) 500 MG tablet Take 1 tablet (500 mg total) by mouth 2 (two) times daily.   lidocaine  (HM LIDOCAINE  PATCH) 4 % Place 1 patch onto the skin daily as needed.   losartan  (COZAAR ) 100 MG tablet TAKE 1 TABLET BY MOUTH EVERY DAY   metoprolol  succinate (TOPROL -XL) 25 MG 24 hr tablet TAKE 1 TABLET (25 MG TOTAL) BY MOUTH DAILY.   montelukast  (SINGULAIR ) 10 MG tablet TAKE 1 TABLET BY MOUTH EVERYDAY AT BEDTIME   Oxycodone  HCl 10 MG TABS Take 1 tablet (10 mg total) by mouth every 12 (twelve) hours as needed.   pantoprazole  (PROTONIX ) 20 MG tablet TAKE 1 TABLET BY MOUTH EVERY DAY   potassium chloride  (KLOR-CON ) 10 MEQ tablet TAKE 1 TABLET BY MOUTH EVERY DAY   rosuvastatin  (CRESTOR ) 20 MG tablet Take 1 tablet (20 mg total) by mouth daily.   spironolactone  (ALDACTONE ) 25 MG tablet TAKE 1 TABLET (25 MG TOTAL) BY MOUTH DAILY.   topiramate  (TOPAMAX ) 100 MG tablet  TAKE 1 TABLET BY MOUTH TWICE A DAY   Facility-Administered Encounter Medications as of 02/20/2024  Medication   lidocaine  (XYLOCAINE ) 1 % (with pres) injection 1 mL   lidocaine  (XYLOCAINE ) 1 % (with pres) injection 4 mL   triamcinolone  acetonide (KENALOG -40) injection 40 mg   triamcinolone  acetonide (KENALOG -40) injection 40 mg    Allergies (verified) Patient has no known allergies.   History: Past Medical History:  Diagnosis Date   Acid reflux    Allergy    Anxiety    DDD (degenerative disc disease), lumbar    Depression    Facet joint disease    GERD (gastroesophageal reflux disease)    High  cholesterol    Hypertension    Low vitamin D  level    OSA (obstructive sleep apnea)    Primary osteoarthritis involving multiple joints    Pulmonary embolism (HCC)    Seizures (HCC)    Sleep apnea    C PAP   Past Surgical History:  Procedure Laterality Date   BREAST BIOPSY Right    benign   CHOLECYSTECTOMY     left and right rotator cuff     Family History  Problem Relation Age of Onset   Pancreatic cancer Mother    Thyroid  cancer Sister    Colon polyps Daughter    Kidney cancer Daughter    Sleep apnea Daughter    Breast cancer Maternal Aunt 71   Seizures Paternal Grandmother    Cancer Brother        tongue   Sleep apnea Son    Stroke Son    Seizures Son    Sleep apnea Son    Seizures Grandson    Colon cancer Neg Hx    Crohn's disease Neg Hx    Esophageal cancer Neg Hx    Rectal cancer Neg Hx    Stomach cancer Neg Hx    Ulcerative colitis Neg Hx    Dementia Neg Hx    Social History   Socioeconomic History   Marital status: Widowed    Spouse name: Not on file   Number of children: 6   Years of education: 49   Highest education level: Not on file  Occupational History   Not on file  Tobacco Use   Smoking status: Never   Smokeless tobacco: Never  Vaping Use   Vaping status: Never Used  Substance and Sexual Activity   Alcohol use: Not Currently   Drug use: Never   Sexual activity: Not on file  Other Topics Concern   Not on file  Social History Narrative   Lives at home with daughter   Right handed   Caffeine: none    Social Drivers of Corporate investment banker Strain: Low Risk  (02/20/2024)   Overall Financial Resource Strain (CARDIA)    Difficulty of Paying Living Expenses: Not hard at all  Food Insecurity: No Food Insecurity (02/20/2024)   Hunger Vital Sign    Worried About Running Out of Food in the Last Year: Never true    Ran Out of Food in the Last Year: Never true  Transportation Needs: No Transportation Needs (02/20/2024)   PRAPARE -  Administrator, Civil Service (Medical): No    Lack of Transportation (Non-Medical): No  Physical Activity: Inactive (02/20/2024)   Exercise Vital Sign    Days of Exercise per Week: 0 days    Minutes of Exercise per Session: 0 min  Stress: No Stress Concern Present (02/20/2024)  [employer] of Occupational Health - Occupational Stress Questionnaire    Feeling of Stress: Not at all  Social Connections: Moderately Integrated (02/20/2024)   Social Connection and Isolation Panel    Frequency of Communication with Friends and Family: More than three times a week    Frequency of Social Gatherings with Friends and Family: More than three times a week    Attends Religious Services: More than 4 times per year    Active Member of [employer] or Organizations: Yes    Attends Banker Meetings: More than 4 times per year    Marital Status: Widowed    Tobacco Counseling Counseling given: Not Answered    Clinical Intake:  Pre-visit preparation completed: Yes  Pain : No/denies pain Pain Score: 0-No pain     BMI - recorded: 38.59 Nutritional Status: BMI > 30  Obese Nutritional Risks: None Diabetes: No  Lab Results  Component Value Date   HGBA1C 5.9 12/07/2022   HGBA1C 5.8 10/03/2021     How often do you need to have someone help you when you read instructions, pamphlets, or other written materials from your doctor or pharmacy?: 3 - Sometimes (Daughter assist)  Interpreter Needed?: No  Information entered by :: Rojelio Blush LPN   Activities of Daily Living     02/20/2024    1:54 PM 10/05/2023    2:00 PM  In your present state of health, do you have any difficulty performing the following activities:  Hearing? 0 0  Vision? 0 0  Difficulty concentrating or making decisions? 0 0  Walking or climbing stairs? 1   Comment Uses a Arts development officer or bathing? 1   Comment Daughter assist   Doing errands, shopping? 1   Comment Daughter Paramedic and eating ? Y   Comment Daughter assist   Using the Toilet? N   In the past six months, have you accidently leaked urine? Y   Comment Wears Breifs.   Do you have problems with loss of bowel control? Y   Comment Wears Breifs.   Managing your Medications? Y   Comment Daughter assist   Managing your Finances? Y   Comment Daughter assist   Housekeeping or managing your Housekeeping? Y   Comment Daughter assist     Patient Care Team: [name], MD as PCP - General (Family Medicine)  I have updated your Care Teams any recent Medical Services you may have received from other providers in the past year.     Assessment:   This is a routine wellness examination for [place].  Hearing/Vision screen Hearing Screening - Comments:: Denies hearing difficulties   Vision Screening - Comments:: Wears rx glasses - up to date with routine eye exams with  [facility]   Goals Addressed               This Visit's Progress     Lose weight (pt-stated)         Depression Screen     02/20/2024    1:53 PM 02/06/2024   10:27 AM 09/27/2023    4:21 PM 09/03/2023    3:26 PM 07/23/2023    3:00 PM 06/11/2023   10:01 AM 05/18/2023   10:03 AM  PHQ 2/9 Scores  PHQ - 2 Score 0 0 0 0 0 0 0  PHQ- 9 Score 0 1 6    0    Fall Risk     02/20/2024  1:58 PM 02/06/2024   10:27 AM 09/27/2023    4:21 PM 09/03/2023    3:25 PM 07/23/2023    2:59 PM  Fall Risk   Falls in the past year? 1 1 0 0 1  Number falls in past yr: 0 0 0 0 0  Injury with Fall? 0 0 0 0 0  Risk for fall due to : No Fall Risks  No Fall Risks    Follow up Falls evaluation completed Falls evaluation completed Falls evaluation completed      MEDICARE RISK AT HOME:  Medicare Risk at Home Any stairs in or around the home?: Yes If so, are there any without handrails?: No Home free of loose throw rugs in walkways, pet beds, electrical cords, etc?: Yes Adequate lighting in your home to reduce risk of falls?:  Yes Life alert?: No Use of a cane, walker or w/c?: Yes Grab bars in the bathroom?: No Shower chair or bench in shower?: No Elevated toilet seat or a handicapped toilet?: No  TIMED UP AND GO:  Was the test performed?  No  Cognitive Function: 6CIT completed    12/04/2023    4:01 PM  MMSE - Mini Mental State Exam  Orientation to time 3  Orientation to Place 4  Registration 3  Attention/ Calculation 5  Recall 0  Language- name 2 objects 2  Language- repeat 0  Language- follow 3 step command 2  Language- read & follow direction 1  Write a sentence 1  Copy design 0  Total score 21      03/05/2023    5:20 PM  Montreal Cognitive Assessment   Visuospatial/ Executive (0/5) 1  Naming (0/3) 2  Attention: Read list of digits (0/2) 2  Attention: Read list of letters (0/1) 1  Attention: Serial 7 subtraction starting at 100 (0/3) 2  Language: Repeat phrase (0/2) 0  Language : Fluency (0/1) 0  Abstraction (0/2) 0  Delayed Recall (0/5) 0  Orientation (0/6) 4  Total 12  Adjusted Score (based on education) 13      02/20/2024    1:59 PM 02/16/2023    3:19 PM 02/13/2022    4:06 PM  6CIT Screen  What Year? 0 points 0 points 0 points  What month? 0 points 0 points 0 points  What time? 0 points 0 points 0 points  Count back from 20 0 points 0 points 0 points  Months in reverse 0 points 0 points 0 points  Repeat phrase 6 points 0 points 0 points  Total Score 6 points 0 points 0 points    Immunizations Immunization History  Administered Date(s) Administered   Fluad Quad(high Dose 65+) 04/25/2022   Fluad Trivalent(High Dose 65+) 05/04/2023   Influenza, High Dose Seasonal PF 04/17/2018, 04/18/2019   Influenza-Unspecified 05/02/2021, 05/02/2021   Moderna Covid-19 Vaccine Bivalent Booster 31yrs & up 05/02/2021   Moderna Sars-Covid-2 Vaccination 08/14/2019, 09/19/2019, 05/07/2020, 05/17/2020, 01/10/2021, 05/02/2021   Pfizer(Comirnaty)Fall Seasonal Vaccine 12 years and older  03/27/2023   Zoster Recombinant(Shingrix) 11/21/2019, 01/22/2020    Screening Tests Health Maintenance  Topic Date Due   COVID-19 Vaccine (8 - Moderna risk 2024-25 season) 02/22/2024 (Originally 09/24/2023)   DTaP/Tdap/Td (1 - Tdap) 10/02/2024 (Originally 10/31/1964)   Pneumococcal Vaccine: 50+ Years (1 of 2 - PCV) 02/05/2025 (Originally 10/31/1964)   INFLUENZA VACCINE  02/22/2024   Medicare Annual Wellness (AWV)  02/19/2025   DEXA SCAN  Completed   Hepatitis C Screening  Completed   Zoster Vaccines- Shingrix  Completed   Hepatitis B Vaccines  Aged Out   HPV VACCINES  Aged Out   Meningococcal B Vaccine  Aged Out   Colonoscopy  Discontinued    Health Maintenance  There are no preventive care reminders to display for this patient. Health Maintenance Items Addressed:   Additional Screening:  Vision Screening: Recommended annual ophthalmology exams for early detection of glaucoma and other disorders of the eye. Would you like a referral to an eye doctor? No    Dental Screening: Recommended annual dental exams for proper oral hygiene  Community Resource Referral / Chronic Care Management: CRR required this visit?  No   CCM required this visit?  No   Plan:    I have personally reviewed and noted the following in the patient's chart:   Medical and social history Use of alcohol, tobacco or illicit drugs  Current medications and supplements including opioid prescriptions. Patient is not currently taking opioid prescriptions. Functional ability and status Nutritional status Physical activity Advanced directives List of other physicians Hospitalizations, surgeries, and ER visits in previous 12 months Vitals Screenings to include cognitive, depression, and falls Referrals and appointments  In addition, I have reviewed and discussed with patient certain preventive protocols, quality metrics, and best practice recommendations. A written personalized care plan for preventive  services as well as general preventive health recommendations were provided to patient.   Rojelio LELON Blush, LPN   [id number]/[id number]   After Visit Summary: (MyChart) Due to this being a telephonic visit, the after visit summary with patients personalized plan was offered to patient via MyChart   Notes: Nothing significant to report at this time.

## 2024-02-21 NOTE — Telephone Encounter (Signed)
 See alternate phone not for ok to hold

## 2024-02-21 NOTE — Telephone Encounter (Signed)
 Agree with recommendations provided of stopping apixaban > 48 hours prior to procedure.

## 2024-02-21 NOTE — Telephone Encounter (Signed)
 The patient has been notified of this information and all questions answered. Eliquis  will be help 48 hours prior

## 2024-02-22 ENCOUNTER — Inpatient Hospital Stay: Admitting: Family Medicine

## 2024-02-24 ENCOUNTER — Other Ambulatory Visit: Payer: Self-pay | Admitting: Family Medicine

## 2024-02-24 DIAGNOSIS — J302 Other seasonal allergic rhinitis: Secondary | ICD-10-CM

## 2024-02-25 NOTE — Telephone Encounter (Signed)
 Holding blood thinners, increase intake of iron rich foods, can take iron supplements.  Keep upcoming GI procedure appt.

## 2024-02-26 ENCOUNTER — Encounter (HOSPITAL_COMMUNITY): Payer: Self-pay | Admitting: Gastroenterology

## 2024-02-27 ENCOUNTER — Telehealth: Payer: Self-pay

## 2024-02-27 ENCOUNTER — Encounter: Payer: Self-pay | Admitting: Oncology

## 2024-02-27 NOTE — Telephone Encounter (Signed)
 Procedure:Ulrasound Procedure date: 03/03/24 Procedure location: [facility] Arrival Time: 1:45pm Spoke with the patient Y/N: y Any prep concerns? n  Has the patient obtained the prep from the pharmacy ? n Do you have a care partner and transportation: y Any additional concerns? nnnnnnn

## 2024-02-28 NOTE — Telephone Encounter (Signed)
 The pt has been taken off of blood thinner and will be starting iron after he procedure.

## 2024-02-28 NOTE — Telephone Encounter (Signed)
Patient states she is returning a call. 

## 2024-02-29 ENCOUNTER — Ambulatory Visit: Admitting: Physical Medicine & Rehabilitation

## 2024-02-29 NOTE — Telephone Encounter (Signed)
Spoke with patient, patient is aware.

## 2024-02-29 NOTE — Telephone Encounter (Signed)
 Aortic atherosclerosis is cholesterol/plaque buildup in your arteries.  Pt is currently on medication (Crestor ) to help improve cholesterol/stabilize plaque. Making changes to diet will also help this but will not resolve it.

## 2024-03-01 ENCOUNTER — Other Ambulatory Visit: Payer: Self-pay | Admitting: Family Medicine

## 2024-03-01 DIAGNOSIS — J302 Other seasonal allergic rhinitis: Secondary | ICD-10-CM

## 2024-03-01 DIAGNOSIS — I10 Essential (primary) hypertension: Secondary | ICD-10-CM

## 2024-03-03 ENCOUNTER — Ambulatory Visit (HOSPITAL_COMMUNITY): Admitting: Certified Registered Nurse Anesthetist

## 2024-03-03 ENCOUNTER — Ambulatory Visit (HOSPITAL_COMMUNITY)
Admission: RE | Admit: 2024-03-03 | Discharge: 2024-03-03 | Disposition: A | Discharge status: Home / Self Care | Attending: Gastroenterology | Admitting: Gastroenterology

## 2024-03-03 ENCOUNTER — Encounter (HOSPITAL_COMMUNITY)
Admission: RE | Disposition: A | Discharge status: Home / Self Care | Payer: Self-pay | Source: Home / Self Care | Attending: Gastroenterology

## 2024-03-03 ENCOUNTER — Encounter (HOSPITAL_COMMUNITY): Payer: Self-pay | Admitting: Gastroenterology

## 2024-03-03 ENCOUNTER — Other Ambulatory Visit: Payer: Self-pay

## 2024-03-03 ENCOUNTER — Other Ambulatory Visit: Payer: Self-pay | Admitting: Gastroenterology

## 2024-03-03 ENCOUNTER — Telehealth: Payer: Self-pay | Admitting: Gastroenterology

## 2024-03-03 DIAGNOSIS — K2289 Other specified disease of esophagus: Secondary | ICD-10-CM | POA: Insufficient documentation

## 2024-03-03 DIAGNOSIS — C251 Malignant neoplasm of body of pancreas: Secondary | ICD-10-CM | POA: Insufficient documentation

## 2024-03-03 DIAGNOSIS — K2970 Gastritis, unspecified, without bleeding: Secondary | ICD-10-CM | POA: Insufficient documentation

## 2024-03-03 DIAGNOSIS — K8689 Other specified diseases of pancreas: Secondary | ICD-10-CM

## 2024-03-03 DIAGNOSIS — K219 Gastro-esophageal reflux disease without esophagitis: Secondary | ICD-10-CM | POA: Insufficient documentation

## 2024-03-03 DIAGNOSIS — G4733 Obstructive sleep apnea (adult) (pediatric): Secondary | ICD-10-CM | POA: Diagnosis not present

## 2024-03-03 DIAGNOSIS — I899 Noninfective disorder of lymphatic vessels and lymph nodes, unspecified: Secondary | ICD-10-CM

## 2024-03-03 DIAGNOSIS — K3189 Other diseases of stomach and duodenum: Secondary | ICD-10-CM

## 2024-03-03 DIAGNOSIS — Z7901 Long term (current) use of anticoagulants: Secondary | ICD-10-CM | POA: Diagnosis not present

## 2024-03-03 DIAGNOSIS — K31A19 Gastric intestinal metaplasia without dysplasia, unspecified site: Secondary | ICD-10-CM | POA: Diagnosis not present

## 2024-03-03 DIAGNOSIS — K2960 Other gastritis without bleeding: Secondary | ICD-10-CM

## 2024-03-03 DIAGNOSIS — I10 Essential (primary) hypertension: Secondary | ICD-10-CM | POA: Insufficient documentation

## 2024-03-03 DIAGNOSIS — K31A0 Gastric intestinal metaplasia, unspecified: Secondary | ICD-10-CM | POA: Diagnosis not present

## 2024-03-03 DIAGNOSIS — K449 Diaphragmatic hernia without obstruction or gangrene: Secondary | ICD-10-CM | POA: Insufficient documentation

## 2024-03-03 DIAGNOSIS — C259 Malignant neoplasm of pancreas, unspecified: Secondary | ICD-10-CM

## 2024-03-03 DIAGNOSIS — F418 Other specified anxiety disorders: Secondary | ICD-10-CM

## 2024-03-03 DIAGNOSIS — K7689 Other specified diseases of liver: Secondary | ICD-10-CM | POA: Diagnosis not present

## 2024-03-03 HISTORY — PX: FINE NEEDLE ASPIRATION: SHX6590

## 2024-03-03 HISTORY — PX: EUS: SHX5427

## 2024-03-03 HISTORY — PX: ESOPHAGOGASTRODUODENOSCOPY: SHX5428

## 2024-03-03 SURGERY — EGD (ESOPHAGOGASTRODUODENOSCOPY)
Anesthesia: Monitor Anesthesia Care

## 2024-03-03 MED ORDER — PROPOFOL 500 MG/50ML IV EMUL
INTRAVENOUS | Status: AC
  Filled 2024-03-03: qty 50

## 2024-03-03 MED ORDER — PHENYLEPHRINE 80 MCG/ML (10ML) SYRINGE FOR IV PUSH (FOR BLOOD PRESSURE SUPPORT)
PREFILLED_SYRINGE | INTRAVENOUS | Status: DC | PRN
  Administered 2024-03-03 (×6): 80 ug via INTRAVENOUS

## 2024-03-03 MED ORDER — SODIUM CHLORIDE 0.9 % IV SOLN: INTRAVENOUS | Status: DC

## 2024-03-03 MED ORDER — DEXMEDETOMIDINE HCL IN NACL 80 MCG/20ML IV SOLN
INTRAVENOUS | Status: DC | PRN
  Administered 2024-03-03 (×2): 4 ug via INTRAVENOUS

## 2024-03-03 MED ORDER — PROPOFOL 500 MG/50ML IV EMUL
INTRAVENOUS | Status: DC | PRN
  Administered 2024-03-03 (×2): 100 ug/kg/min via INTRAVENOUS

## 2024-03-03 MED ORDER — LIDOCAINE HCL (CARDIAC) PF 100 MG/5ML IV SOSY
PREFILLED_SYRINGE | INTRAVENOUS | Status: DC | PRN
  Administered 2024-03-03 (×4): 50 mg via INTRAVENOUS

## 2024-03-03 MED ORDER — ACETAMINOPHEN 500 MG PO TABS
1000.0000 mg | ORAL_TABLET | Freq: Four times a day (QID) | ORAL | Status: DC | PRN
  Administered 2024-03-03 (×2): 1000 mg via ORAL
  Filled 2024-03-03: qty 2

## 2024-03-03 MED ORDER — SODIUM CHLORIDE 0.9 % IV SOLN
INTRAVENOUS | Status: AC | PRN
  Administered 2024-03-03 (×2): 500 mL via INTRAMUSCULAR

## 2024-03-03 MED ORDER — PANTOPRAZOLE SODIUM 20 MG PO TBEC: 20.0000 mg | DELAYED_RELEASE_TABLET | Freq: Two times a day (BID) | ORAL | 3 refills | Status: DC

## 2024-03-03 NOTE — Transfer of Care (Signed)
 Immediate Anesthesia Transfer of Care Note  Patient: Gina Key  Procedure(s) Performed: ULTRASOUND, UPPER GI TRACT, ENDOSCOPIC EGD (ESOPHAGOGASTRODUODENOSCOPY) FINE NEEDLE ASPIRATION  Patient Location: PACU  Anesthesia Type:MAC  Level of Consciousness: drowsy  Airway & Oxygen Therapy: Patient Spontanous Breathing and Patient connected to face mask oxygen  Post-op Assessment: Report given to RN and Post -op Vital signs reviewed and stable  Post vital signs: Reviewed and stable  Last Vitals:  Vitals Value Taken Time  BP    Temp    Pulse    Resp    SpO2      Last Pain:  Vitals:   03/03/24 1418  TempSrc: Temporal  PainSc: 0-No pain         Complications: No notable events documented.

## 2024-03-03 NOTE — Telephone Encounter (Signed)
 The patient called this evening with her daughter on the phone. She underwent EUS with pancreatic mass biopsies this afternoon with me. Patient had done well post proc recovery time). She was able to tolerate liquids and mashed potatoes and gravy this evening without problem. She started to develop some gas like pains under her breasts. She describes in the past having gas pains that come and go and improve with moving. She has taken some OTC medications in the past. She does not have any of those present currently.  She denies any nausea or vomiting at this time and is not having overt abdominal pain.  No difficulty swallowing. No fevers or chills. The patient again states this is pains that she has had in the past and it is not new or different from what she is experienced previously, just she was concerned since this occurred after her procedure. I have a low concern, currently, for pancreatitis, but it is certainly possible that this could develop. In the interim, since she seems to be stable and is not having progressive nausea or vomiting, we are going to try to treat this as gas pains. We will plan for the patient to obtain Gas-X extra strength or Phazyme maximum strength. She will also take Mylanta twice daily overnight. She can use chamomile or mint tea to try to calm her stomach if things are persisting. The patient and daughter are aware that if things progress or worsen, they certainly can call me back, but I am going to recommend at that point going to the emergency department for further evaluation and rule out of pancreatitis or other complications from today's procedure. I will have my team reach out to the patient tomorrow and follow-up to see how she is doing. They are aware to call us  back/me back overnight (I am on-call) if issues persist or progress. They are appreciative for the call back.   Aloha Finner, MD [facility] Office #  [phone]

## 2024-03-03 NOTE — H&P (Signed)
 GASTROENTEROLOGY PROCEDURE H&P NOTE   Primary Care Physician: [name], MD  HPI: Gina Key is a 78 y.o. female who presents for EGD/EUS for evaluation of pancreatic abnormality/lesion (normal CA19-9).  Past Medical History:  Diagnosis Date   Acid reflux    Allergy    Anxiety    DDD (degenerative disc disease), lumbar    Depression    Facet joint disease    GERD (gastroesophageal reflux disease)    High cholesterol    Hypertension    Low vitamin D  level    OSA (obstructive sleep apnea)    Primary osteoarthritis involving multiple joints    Pulmonary embolism (HCC)    Seizures (HCC)    Sleep apnea    C PAP   Past Surgical History:  Procedure Laterality Date   BREAST BIOPSY Right    benign   CHOLECYSTECTOMY     left and right rotator cuff     Current Facility-Administered Medications  Medication Dose Route Frequency Provider Last Rate Last Admin   0.9 %  sodium chloride  infusion   Intravenous Continuous Mansouraty, Aloha Raddle., MD       0.9 %  sodium chloride  infusion    Continuous PRN Mansouraty, Aloha Raddle., MD 10 mL/hr at 03/03/24 1431 500 mL at 03/03/24 1431    Current Facility-Administered Medications:    0.9 %  sodium chloride  infusion, , Intravenous, Continuous, Mansouraty, Aloha Raddle., MD   0.9 %  sodium chloride  infusion, , , Continuous PRN, Mansouraty, Aloha Raddle., MD, Last Rate: 10 mL/hr at 03/03/24 1431, 500 mL at 03/03/24 1431 No Known Allergies Family History  Problem Relation Age of Onset   Pancreatic cancer Mother    Thyroid  cancer Sister    Colon polyps Daughter    Kidney cancer Daughter    Sleep apnea Daughter    Breast cancer Maternal Aunt 56   Seizures Paternal Grandmother    Cancer Brother        tongue   Sleep apnea Son    Stroke Son    Seizures Son    Sleep apnea Son    Seizures Grandson    Colon cancer Neg Hx    Crohn's disease Neg Hx    Esophageal cancer Neg Hx    Rectal cancer Neg Hx    Stomach cancer Neg Hx     Ulcerative colitis Neg Hx    Dementia Neg Hx    Social History   Socioeconomic History   Marital status: Widowed    Spouse name: Not on file   Number of children: 6   Years of education: 70   Highest education level: Not on file  Occupational History   Not on file  Tobacco Use   Smoking status: Never   Smokeless tobacco: Never  Vaping Use   Vaping status: Never Used  Substance and Sexual Activity   Alcohol use: Not Currently   Drug use: Never   Sexual activity: Not on file  Other Topics Concern   Not on file  Social History Narrative   Lives at home with daughter   Right handed   Caffeine: none    Social Drivers of Corporate investment banker Strain: Low Risk  (02/20/2024)   Overall Financial Resource Strain (CARDIA)    Difficulty of Paying Living Expenses: Not hard at all  Food Insecurity: No Food Insecurity (02/20/2024)   Hunger Vital Sign    Worried About Running Out of Food in the Last Year: Never  true    Ran Out of Food in the Last Year: Never true  Transportation Needs: No Transportation Needs (02/20/2024)   PRAPARE - Administrator, Civil Service (Medical): No    Lack of Transportation (Non-Medical): No  Physical Activity: Inactive (02/20/2024)   Exercise Vital Sign    Days of Exercise per Week: 0 days    Minutes of Exercise per Session: 0 min  Stress: No Stress Concern Present (02/20/2024)   [employer] of Occupational Health - Occupational Stress Questionnaire    Feeling of Stress: Not at all  Social Connections: Moderately Integrated (02/20/2024)   Social Connection and Isolation Panel    Frequency of Communication with Friends and Family: More than three times a week    Frequency of Social Gatherings with Friends and Family: More than three times a week    Attends Religious Services: More than 4 times per year    Active Member of [employer] or Organizations: Yes    Attends Banker Meetings: More than 4 times per year    Marital  Status: Widowed  Intimate Partner Violence: Not At Risk (02/20/2024)   Humiliation, Afraid, Rape, and Kick questionnaire    Fear of Current or Ex-Partner: No    Emotionally Abused: No    Physically Abused: No    Sexually Abused: No    Physical Exam: Today's Vitals   03/03/24 1418  BP: (!) 163/71  Pulse: 70  Resp: (!) 9  Temp: (!) 97.3 F (36.3 C)  TempSrc: Temporal  SpO2: 100%  Weight: 93.4 kg  Height: 5' 2 (1.575 m)  PainSc: 0-No pain   Body mass index is 37.68 kg/m. GEN: NAD EYE: Sclerae anicteric ENT: MMM CV: Non-tachycardic GI: Soft, NT/ND NEURO:  Alert & Oriented x 3  Lab Results: No results for input(s): WBC, HGB, HCT, PLT in the last 72 hours. BMET No results for input(s): NA, K, CL, CO2, GLUCOSE, BUN, CREATININE, CALCIUM  in the last 72 hours. LFT No results for input(s): PROT, ALBUMIN, AST, ALT, ALKPHOS, BILITOT, BILIDIR, IBILI in the last 72 hours. PT/INR No results for input(s): LABPROT, INR in the last 72 hours.   Impression / Plan: This is a 78 y.o.female who presents for EGD/EUS for evaluation of pancreatic abnormality/lesion (normal CA19-9).   The risks of an EUS including intestinal perforation, bleeding, infection, aspiration, and medication effects were discussed as was the possibility it may not give a definitive diagnosis if a biopsy is performed.  When a biopsy of the pancreas is done as part of the EUS, there is an additional risk of pancreatitis at the rate of about 1-2%.  It was explained that procedure related pancreatitis is typically mild, although it can be severe and even life threatening, which is why we do not perform random pancreatic biopsies and only biopsy a lesion/area we feel is concerning enough to warrant the risk.  The risks and benefits of endoscopic evaluation/treatment were discussed with the patient and/or family; these include but are not limited to the risk of perforation,  infection, bleeding, missed lesions, lack of diagnosis, severe illness requiring hospitalization, as well as anesthesia and sedation related illnesses.  The patient's history has been reviewed, patient examined, no change in status, and deemed stable for procedure.  The patient and/or family is agreeable to proceed.    Aloha Finner, MD [facility] Office # [phone]

## 2024-03-03 NOTE — Discharge Instructions (Signed)
 YOU HAD AN ENDOSCOPIC PROCEDURE TODAY: Refer to the procedure report and other information in the discharge instructions given to you for any specific questions about what was found during the examination. If this information does not answer your questions, please call [place] office at [phone] to clarify.   YOU SHOULD EXPECT: Some feelings of bloating in the abdomen. Passage of more gas than usual. Walking can help get rid of the air that was put into your GI tract during the procedure and reduce the bloating. Some abdominal soreness may be present for a day or two, also.  DIET: Your first meal following the procedure should be a light meal and then it is ok to progress to your normal diet. A half-sandwich or bowl of soup is an example of a good first meal. Heavy or fried foods are harder to digest and may make you feel nauseous or bloated. Drink plenty of fluids but you should avoid alcoholic beverages for 24 hours.   ACTIVITY: Your care partner should take you home directly after the procedure. You should plan to take it easy, moving slowly for the rest of the day. You can resume normal activity the day after the procedure however YOU SHOULD NOT DRIVE, use power tools, machinery or perform tasks that involve climbing or major physical exertion for 24 hours (because of the sedation medicines used during the test).   SYMPTOMS TO REPORT IMMEDIATELY: A gastroenterologist can be reached at any hour. Please call [phone]  for any of the following symptoms:   Following upper endoscopy (EGD, EUS, ERCP, esophageal dilation) Vomiting of blood or coffee ground material  New, significant abdominal pain  New, significant chest pain or pain under the shoulder blades  Painful or persistently difficult swallowing  New shortness of breath  Black, tarry-looking or red, bloody stools  FOLLOW UP:  If any biopsies were taken you will be contacted by phone or by letter within the next 1-3 weeks. Call  [phone]  if you have not heard about the biopsies in 3 weeks.  Please also call with any specific questions about appointments or follow up tests.

## 2024-03-03 NOTE — Anesthesia Preprocedure Evaluation (Signed)
 Anesthesia Evaluation  Patient identified by MRN, date of birth, ID band Patient awake    Reviewed: Allergy & Precautions, NPO status , Patient's Chart, lab work & pertinent test results  History of Anesthesia Complications Negative for: history of anesthetic complications  Airway Mallampati: II  TM Distance: >3 FB Neck ROM: Full    Dental  (+) Chipped, Missing   Pulmonary neg pulmonary ROS, sleep apnea , neg COPD, Patient abstained from smoking.Not current smoker   Pulmonary exam normal breath sounds clear to auscultation       Cardiovascular Exercise Tolerance: Good METShypertension, Pt. on medications (-) CAD and (-) Past MI (-) dysrhythmias  Rhythm:Regular Rate:Normal - Systolic murmurs    Neuro/Psych Seizures -, Well Controlled,  PSYCHIATRIC DISORDERS Anxiety Depression       GI/Hepatic ,GERD  Medicated,,(+)     (-) substance abuse    Endo/Other  neg diabetes    Renal/GU negative Renal ROS     Musculoskeletal   Abdominal  (+) + obese  Peds  Hematology   Anesthesia Other Findings Past Medical History: No date: Acid reflux No date: Allergy No date: Anxiety No date: DDD (degenerative disc disease), lumbar No date: Depression No date: Facet joint disease No date: GERD (gastroesophageal reflux disease) No date: High cholesterol No date: Hypertension No date: Low vitamin D  level No date: OSA (obstructive sleep apnea) No date: Primary osteoarthritis involving multiple joints No date: Pulmonary embolism (HCC) No date: Seizures (HCC) No date: Sleep apnea     Comment:  C PAP  Reproductive/Obstetrics                              Anesthesia Physical Anesthesia Plan  ASA: 2  Anesthesia Plan: MAC   Post-op Pain Management: Minimal or no pain anticipated   Induction: Intravenous  PONV Risk Score and Plan: 2 and Propofol  infusion, TIVA and Ondansetron   Airway Management  Planned: Nasal Cannula  Additional Equipment: None  Intra-op Plan:   Post-operative Plan:   Informed Consent: I have reviewed the patients History and Physical, chart, labs and discussed the procedure including the risks, benefits and alternatives for the proposed anesthesia with the patient or authorized representative who has indicated his/her understanding and acceptance.     Dental advisory given  Plan Discussed with: CRNA and Surgeon  Anesthesia Plan Comments: (Discussed risks of anesthesia with patient, including possibility of difficulty with spontaneous ventilation under anesthesia necessitating airway intervention, PONV, and rare risks such as cardiac or respiratory or neurological events, and allergic reactions. Discussed the role of CRNA in patient's perioperative care. Patient understands.)        Anesthesia Quick Evaluation

## 2024-03-03 NOTE — Op Note (Signed)
 [facility] Patient Name: Gina Key Procedure Date: 03/03/2024 MRN: [id number] Attending MD: Aloha Finner , MD, [id number] Date of Birth: Nov 08, 1945 CSN: [id number] Age: 78 Admit Type: Outpatient Procedure:                Upper EUS Indications:              Dilated pancreatic duct on MRCP, Suspected mass in                            pancreas on MRCP Providers:                Aloha Finner, MD, Gina Lines, RN, [facility]                            Key, Technician, Gina Rhodia BONINE Referring MD:             Aloha Finner, MD Medicines:                Monitored Anesthesia Care Complications:            No immediate complications. Estimated Blood Loss:     Estimated blood loss was minimal. Procedure:                Pre-Anesthesia Assessment:                           - Prior to the procedure, a History and Physical                            was performed, and patient medications and                            allergies were reviewed. The patient's tolerance of                            previous anesthesia was also reviewed. The risks                            and benefits of the procedure and the sedation                            options and risks were discussed with the patient.                            All questions were answered, and informed consent                            was obtained. Prior Anticoagulants: The patient has                            taken Eliquis  (apixaban ), last dose was 2 days                            prior to procedure. ASA Grade Assessment: III - A  patient with severe systemic disease. After                            reviewing the risks and benefits, the patient was                            deemed in satisfactory condition to undergo the                            procedure.                           After obtaining informed consent, the endoscope was                             passed under direct vision. Throughout the                            procedure, the patient's blood pressure, pulse, and                            oxygen saturations were monitored continuously. The                            [id number]-[id number] ([id number]) Olympus endoscope was introduced                            through the mouth, and advanced to the second part                            of duodenum. The [id number]-[id number] ([id number]) Olympus                            duodenoscope was introduced through the mouth, and                            advanced to the area of papilla. The [id number]                            ([id number]) Olympus endosonoscope was introduced                            through the mouth, and advanced to the duodenum for                            ultrasound examination from the stomach and                            duodenum. The upper EUS was accomplished without                            difficulty. The patient tolerated the procedure. Scope In: Scope Out: Findings:      ENDOSCOPIC FINDING: :      No gross lesions were noted in  the entire esophagus.      The Z-line was irregular and was found 36 cm from the incisors.      A 3 cm hiatal hernia was present.      Segmental mild inflammation characterized by erosions and erythema was       found in the gastric antrum.      No other gross lesions were noted in the entire examined stomach.       Biopsies were taken with a cold forceps for histology and Helicobacter       pylori testing.      No gross lesions were noted in the duodenal bulb, in the first portion       of the duodenum and in the second portion of the duodenum.      The major papilla was normal.      ENDOSONOGRAPHIC FINDING: :      An irregular mass was identified in the pancreatic body. The mass was       hypoechoic. The mass measured 18 mm by 16 mm in maximal cross-sectional       diameter. The endosonographic borders were well-defined. There was       sonographic  evidence suggesting invasion into the splenic artery and       splenic vein (manifested by abutment). An intact interface was seen       between the mass and the superior mesenteric artery and celiac trunk       suggesting a lack of invasion. The remainder of the pancreas was       examined. The endosonographic appearance of the upstream pancreas showed       evidence of parenchymal atrophy and the upstream pancreatic duct       indicated duct dilation, the downstream pancreas was otherwise       unremarkable. Fine needle biopsy was performed of the mass. Color       Doppler imaging was utilized prior to needle puncture to confirm a lack       of significant vascular structures within the needle path. Five passes       were made with the 22 gauge acquire?"S biopsy needle using a transgastric       approach. A visible core of tissue was obtained. Preliminary cytologic       examination and touch preps were performed. The cellularity of the       specimen was adequate. Final cytology results are pending.      There was no sign of significant endosonographic abnormality in the       common bile duct and in the common hepatic duct. No stones, no biliary       sludge and ducts with regular contour were identified.      Endosonographic imaging of the ampulla showed no intramural       (subepithelial) lesion.      No malignant-appearing lymph nodes were visualized in the celiac region       (level 20), peripancreatic region and porta hepatis region.      A cyst was found in the visualized portion of the liver and measured 26       mm by 23 mm in maximal cross-sectional diameter. The cyst was anechoic.       It was without septae. The outer wall of the lesion was not seen. There       are no other solid mass lesions noted in the visualized portion of the  liver.      The celiac region was visualized. Impression:               EGD impression:                           - No gross lesions in  the entire esophagus. Z-line                            irregular, 36 cm from the incisors.                           - 3 cm hiatal hernia.                           - Antral gastritis. No other gross lesions in the                            entire stomach. Biopsied.                           - No gross lesions in the duodenal bulb, in the                            first portion of the duodenum and in the second                            portion of the duodenum.                           - Normal major papilla.                           EUS impression:                           - A mass was identified in the pancreatic body.                            Cytology results are pending. However, the                            endosonographic appearance is highly suspicious for                            adenocarcinoma. This was staged T1c N0 Mx by                            endosonographic criteria. The staging applies if                            malignancy is confirmed. Fine needle biopsy                            performed.                           -  There was no sign of significant pathology in the                            common bile duct and in the common hepatic duct.                           - No malignant-appearing lymph nodes were                            visualized in the celiac region (level 20),                            peripancreatic region and porta hepatis region.                           - A cyst was found in the visualized portion of the                            liver and measured 26 mm by 23 mm. No evidence of                            any solid mass lesions in the visualized left lobe                            of the liver. Moderate Sedation:      Not Applicable - Patient had care per Anesthesia. Recommendation:           - The patient will be observed post-procedure,                            until all discharge criteria are met.                            - Discharge patient to home.                           - Patient has a contact number available for                            emergencies. The signs and symptoms of potential                            delayed complications were discussed with the                            patient. Return to normal activities tomorrow.                            Written discharge instructions were provided to the                            patient.                           -  Low fat diet for 1 week.                           - Observe patient's clinical course.                           - Await cytology results and await path results.                           - Increase PPI to 20 mg twice daily.                           - May restart Eliquis  on 8/13 PM to decrease risk                            of post interventional bleeding.                           - If results are confirmatory for malignancy,                            patient will need a noncontrast CT chest for                            completion staging. Referral to oncology and to                            surgical oncology will be made at that time.                           - Monitor for signs/symptoms of pancreatitis,                            bleeding, perforation, and infection. If issues                            please call our number to get further assistance as                            needed.                           - The findings and recommendations were discussed                            with the patient.                           - The findings and recommendations were discussed                            with the patient's family. Procedure Code(s):        --- Professional ---  56761, Esophagogastroduodenoscopy, flexible,                            transoral; with transendoscopic ultrasound-guided                            intramural or transmural fine needle                             aspiration/biopsy(s), (includes endoscopic                            ultrasound examination limited to the esophagus,                            stomach or duodenum, and adjacent structures)                           43239, 59, Esophagogastroduodenoscopy, flexible,                            transoral; with biopsy, single or multiple Diagnosis Code(s):        --- Professional ---                           K22.89, Other specified disease of esophagus                           K44.9, Diaphragmatic hernia without obstruction or                            gangrene                           K29.70, Gastritis, unspecified, without bleeding                           K86.89, Other specified diseases of pancreas                           I89.9, Noninfective disorder of lymphatic vessels                            and lymph nodes, unspecified                           K76.89, Other specified diseases of liver                           R93.3, Abnormal findings on diagnostic imaging of                            other parts of digestive tract CPT copyright 2022 [employer]. All rights reserved. The codes documented in this report are preliminary and upon coder review may  be revised to meet current compliance requirements. Aloha Finner, MD 03/03/2024 4:27:32 PM Number of Addenda: 0

## 2024-03-04 ENCOUNTER — Encounter (HOSPITAL_COMMUNITY): Payer: Self-pay | Admitting: Gastroenterology

## 2024-03-04 ENCOUNTER — Other Ambulatory Visit: Payer: Self-pay | Admitting: Gastroenterology

## 2024-03-04 DIAGNOSIS — K219 Gastro-esophageal reflux disease without esophagitis: Secondary | ICD-10-CM

## 2024-03-04 NOTE — Telephone Encounter (Signed)
 Thanks for update. GM

## 2024-03-04 NOTE — Telephone Encounter (Signed)
 I was able to reach the pt daughter and she states the pt is doing very well. No complaints at this time. She will call if things change

## 2024-03-04 NOTE — Anesthesia Postprocedure Evaluation (Signed)
 Anesthesia Post Note  Patient: Gina Key  Procedure(s) Performed: ULTRASOUND, UPPER GI TRACT, ENDOSCOPIC EGD (ESOPHAGOGASTRODUODENOSCOPY) FINE NEEDLE ASPIRATION     Patient location during evaluation: Endoscopy Anesthesia Type: MAC Level of consciousness: awake and alert Pain management: pain level controlled Vital Signs Assessment: post-procedure vital signs reviewed and stable Respiratory status: spontaneous breathing, nonlabored ventilation and respiratory function stable Cardiovascular status: stable and blood pressure returned to baseline Postop Assessment: no apparent nausea or vomiting Anesthetic complications: no   No notable events documented.  Last Vitals:  Vitals:   03/03/24 1630 03/03/24 1640  BP: (!) 146/91 (!) 144/55  Pulse: 66 60  Resp: 20 15  Temp:    SpO2: 100% 100%    Last Pain:  Vitals:   03/03/24 1640  TempSrc:   PainSc: 0-No pain                 Zo Loudon,W. EDMOND

## 2024-03-05 DIAGNOSIS — N200 Calculus of kidney: Secondary | ICD-10-CM | POA: Diagnosis not present

## 2024-03-05 DIAGNOSIS — R310 Gross hematuria: Secondary | ICD-10-CM | POA: Diagnosis not present

## 2024-03-05 LAB — SURGICAL PATHOLOGY

## 2024-03-07 ENCOUNTER — Ambulatory Visit: Payer: Self-pay | Admitting: Gastroenterology

## 2024-03-07 ENCOUNTER — Other Ambulatory Visit: Payer: Self-pay | Admitting: Physician Assistant

## 2024-03-07 ENCOUNTER — Other Ambulatory Visit: Payer: Self-pay

## 2024-03-07 DIAGNOSIS — C259 Malignant neoplasm of pancreas, unspecified: Secondary | ICD-10-CM

## 2024-03-07 LAB — CYTOLOGY - NON PAP

## 2024-03-07 NOTE — Progress Notes (Signed)
 CT order has been entered and sent to the schedulers  Referral to [facility] has been made with records faxed

## 2024-03-07 NOTE — Progress Notes (Signed)
 I notified Samanthia [place] daughter [name] Schaffer by phone regarding biopsy results. She had already been informed by Dr. Wilhelmenia that pathology is showing pancreatic adenocarcinoma. Per discussion with Dr. Federico patient will need a PET scan for staging to complete work up. This has been ordered. Updated Dr. Wilhelmenia on plan and he is agreeable to cancel CT chest that had been ordered. Patient has already been referred to Dr. Candice. Dasie for consideration of surgical management by GI team. All of Gina Key's questions were answered and she expressed understanding of the plan provided.

## 2024-03-11 ENCOUNTER — Encounter: Admitting: Physical Medicine & Rehabilitation

## 2024-03-11 ENCOUNTER — Other Ambulatory Visit (HOSPITAL_COMMUNITY)

## 2024-03-11 ENCOUNTER — Encounter: Payer: Self-pay | Admitting: Medical Oncology

## 2024-03-11 ENCOUNTER — Encounter (HOSPITAL_COMMUNITY)
Admission: RE | Admit: 2024-03-11 | Discharge: 2024-03-11 | Disposition: A | Discharge status: Home / Self Care | Source: Ambulatory Visit | Attending: Physician Assistant | Admitting: Physician Assistant

## 2024-03-11 ENCOUNTER — Telehealth: Payer: Self-pay | Admitting: Physician Assistant

## 2024-03-11 DIAGNOSIS — C259 Malignant neoplasm of pancreas, unspecified: Secondary | ICD-10-CM | POA: Insufficient documentation

## 2024-03-11 DIAGNOSIS — I2510 Atherosclerotic heart disease of native coronary artery without angina pectoris: Secondary | ICD-10-CM | POA: Diagnosis not present

## 2024-03-11 DIAGNOSIS — I700 Atherosclerosis of aorta: Secondary | ICD-10-CM | POA: Diagnosis not present

## 2024-03-11 LAB — GLUCOSE, CAPILLARY: Glucose-Capillary: 104 mg/dL — ABNORMAL HIGH (ref 70–99)

## 2024-03-11 MED ORDER — FLUDEOXYGLUCOSE F - 18 (FDG) INJECTION
10.2000 | Freq: Once | INTRAVENOUS | Status: AC | PRN
  Administered 2024-03-11: 10.2 via INTRAVENOUS

## 2024-03-11 NOTE — Telephone Encounter (Signed)
 I notified Gina Key and [name] Schaffer by phone regarding PET scan results. 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 Autumn to finalize treatment recommendations on 03/14/24. All of patient's questions were answered and they expressed understanding of the plan provided.

## 2024-03-14 ENCOUNTER — Inpatient Hospital Stay: Attending: Physician Assistant | Admitting: Oncology

## 2024-03-14 VITALS — BP 116/63 | HR 69 | Temp 97.5°F | Resp 17 | Ht 62.0 in | Wt 209.0 lb

## 2024-03-14 DIAGNOSIS — Z79899 Other long term (current) drug therapy: Secondary | ICD-10-CM | POA: Insufficient documentation

## 2024-03-14 DIAGNOSIS — C259 Malignant neoplasm of pancreas, unspecified: Secondary | ICD-10-CM | POA: Insufficient documentation

## 2024-03-14 DIAGNOSIS — Z808 Family history of malignant neoplasm of other organs or systems: Secondary | ICD-10-CM | POA: Diagnosis not present

## 2024-03-14 DIAGNOSIS — Z803 Family history of malignant neoplasm of breast: Secondary | ICD-10-CM | POA: Insufficient documentation

## 2024-03-14 DIAGNOSIS — Z7982 Long term (current) use of aspirin: Secondary | ICD-10-CM | POA: Insufficient documentation

## 2024-03-14 DIAGNOSIS — D509 Iron deficiency anemia, unspecified: Secondary | ICD-10-CM | POA: Diagnosis not present

## 2024-03-14 DIAGNOSIS — Z86711 Personal history of pulmonary embolism: Secondary | ICD-10-CM | POA: Diagnosis not present

## 2024-03-14 DIAGNOSIS — Z0000 Encounter for general adult medical examination without abnormal findings: Secondary | ICD-10-CM | POA: Diagnosis not present

## 2024-03-14 DIAGNOSIS — K8689 Other specified diseases of pancreas: Secondary | ICD-10-CM

## 2024-03-14 DIAGNOSIS — Z8051 Family history of malignant neoplasm of kidney: Secondary | ICD-10-CM | POA: Diagnosis not present

## 2024-03-14 DIAGNOSIS — Z7901 Long term (current) use of anticoagulants: Secondary | ICD-10-CM | POA: Insufficient documentation

## 2024-03-14 DIAGNOSIS — Z86718 Personal history of other venous thrombosis and embolism: Secondary | ICD-10-CM | POA: Diagnosis not present

## 2024-03-14 NOTE — Addendum Note (Signed)
 Addended by: Bayne Fosnaugh L on: 03/14/2024 04:25 PM   Modules accepted: Orders

## 2024-03-14 NOTE — Progress Notes (Signed)
 [facility]  ONCOLOGY CONSULT NOTE   PATIENT NAME: Gina Key   MR#: [id number] DOB: 05-27-1946  DATE OF SERVICE: 03/14/2024   REFERRING PROVIDER  [name], MD   Patient Care Team: [name], MD as PCP - General (Family Medicine) Ardis, Evalene CROME, RN as Oncology Nurse Navigator Mansouraty, Aloha Raddle., MD as Consulting Physician (Gastroenterology)    CHIEF COMPLAINT/ PURPOSE OF CONSULTATION:   Newly diagnosed pancreatic adenocarcinoma  ASSESSMENT & PLAN:   Gina Key is a 78 y.o. lady with a past medical history of hypertension, history of pulmonary embolism in March 2025, seizure disorder, obstructive sleep apnea, arthritis, GERD, was referred to our clinic for newly diagnosed pancreatic adenocarcinoma.  Pancreatic adenocarcinoma [facility]) She was seen in our rapid diagnostic clinic on 02/12/2024 for consultation and was referred to GI for EUS/biopsy.  Labs on that day showed a CA 19-9 of 8, CEA undetectable, chromogranin A increased at 224.8 ng/mL.  CBCD and CMP were unremarkable with normal LFTs.  On 03/03/2024 Dr. Wilhelmenia performed EGD and EUS.  On EUS, mass was identified in the pancreatic body.  Endosonographic appearance was highly suspicious for adenocarcinoma.  Staged as T1c, N0, MX.  FNA performed.  There was no sign of significant pathology in the CBD and in the common hepatic duct.  No malignant appearing lymph nodes were visualized in the celiac region, peripancreatic region and porta hepatis.  A cyst was found in the visualized portion of the liver measured 26 mm x 23 mm.  No evidence of any solid mass lesions in the left lobe of liver.  FNA from pancreatic mass came back positive for adenocarcinoma.  On 03/11/2024, staging PET scan showed ill-defined heterogeneous low-level hypermetabolic in the mid pancreas, corresponding to the abnormality on previous MRI.  Features compatible with biopsy-proven pancreatic  adenocarcinoma.  16 x 11 mm focus of amorphous soft tissue in the gastrohepatic ligament was hypermetabolic and concerning for metastatic disease.  No evidence of hypermetabolic liver metastasis.  No evidence of metastatic disease elsewhere.  Differential includes direct extension or lymph node involvement.   Plan is to discuss her case in our tumor conference.  Patient has an appointment with Dr. Aron on 03/20/2024 for surgical consultation.  She may need laparoscopic staging given PET scan findings.  If determined to be not a surgical candidate upfront, we will discuss neoadjuvant chemotherapy with gemcitabine and Abraxane.  I will plan to see her after her evaluation with Dr. Aron.  Referral sent for genetic testing.   Venous thromboembolism (deep vein thrombosis and pulmonary embolism) Venous thromboembolism with deep vein thrombosis and pulmonary embolism. Temporarily discontinued Eliquis  due to low hemoglobin post-biopsy. Risk of clotting associated with pancreatic cancer. Concerns about recurrence of blood clots. - Resume Eliquis  - Monitor for signs of bleeding - Check hemoglobin levels during next week's lab appointment  Iron deficiency anemia Iron deficiency anemia with low hemoglobin levels. Currently on iron supplementation. - Continue iron supplementation - Monitor hemoglobin levels during next week's lab appointment  Acute postprocedural pain after pancreatic biopsy Postprocedural pain following pancreatic biopsy, described as intermittent and possibly related to gas. Currently managed with oxycodone  for osteoarthritis. - Use oxycodone  for pain management - Consider Tylenol  for additional pain relief  I reviewed lab results and outside records for this visit and discussed relevant results with the patient. Diagnosis, plan of care and treatment options were also discussed in detail with the patient. Opportunity provided to ask questions and  answers provided to her apparent  satisfaction. Provided instructions to call our clinic with any problems, questions or concerns prior to return visit. I recommended to continue follow-up with PCP and sub-specialists. She verbalized understanding and agreed with the plan. No barriers to learning was detected.  NCCN guidelines have been consulted in the planning of this patient's care.  Dynastie Knoop, MD   [facility] - A DEPT OF [facility] [address] [place] [address] Dept: [phone] Dept Fax: [phone]   HISTORY OF PRESENTING ILLNESS:   I have reviewed her chart and materials related to her cancer extensively and collaborated history with the patient. Summary of oncologic history is as follows:  ONCOLOGY HISTORY:  On review of the previous records patient was evaluated in the emergency department on 02/05/2024 for gross hematuria.  CT abdomen pelvis was performed which showed moderate dilation of the pancreatic duct in the mid body and tail overlying and parenchymal atrophy. MR abdomen was ordered by PCP Dr. Mercer and performed on 02/07/24. MRI showed a focal segment of pancreatic ductal dilatation with abrupt caliber change and stricture. Subtle mass lesion in this location identified measuring 17 x 15 mm at the level of the caliber change and has appearance worrisome for neoplasm such as an adenocarcinoma. Image did not show any other areas concerning for malignant disease.   CMP collected 02/05/24 in the ED was overall unremarkable. Further chart review shows patient was diagnosed with a submassive PE with right heart strain 10/05/23 and is currently taking Eliquis .     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 She does not smoke or drink alcohol. She currently lives with her daughter due to past seizures. Her last colonoscopy in February 2024 was normal, as was her last mammogram. Her family history is significant for her mother  having pancreatic cancer at age 5, her brother having throat cancer, and her daughter having kidney cancer.  She was seen in our rapid diagnostic clinic on 02/12/2024 for consultation and was referred to GI for EUS/biopsy.  Labs on that day showed a CA 19-9 of 8, CEA undetectable, chromogranin A increased at 224.8 ng/mL.  CBCD and CMP were unremarkable with normal LFTs.  On 03/03/2024 Dr. Wilhelmenia performed EGD and EUS.  On EUS, mass was identified in the pancreatic body.  Endosonographic appearance was highly suspicious for adenocarcinoma.  Staged as T1c, N0, MX.  FNA performed.  There was no sign of significant pathology in the CBD and in the common hepatic duct.  No malignant appearing lymph nodes were visualized in the celiac region, peripancreatic region and porta hepatis.  A cyst was found in the visualized portion of the liver measured 26 mm x 23 mm.  No evidence of any solid mass lesions in the left lobe of liver.  FNA from pancreatic mass came back positive for adenocarcinoma.  On 03/11/2024, staging PET scan showed ill-defined heterogeneous low-level hypermetabolic in the mid pancreas, corresponding to the abnormality on previous MRI.  Features compatible with biopsy-proven pancreatic adenocarcinoma.  16 x 11 mm focus of amorphous soft tissue in the gastrohepatic ligament was hypermetabolic and concerning for metastatic disease.  No evidence of hypermetabolic liver metastasis.  No evidence of metastatic disease elsewhere.  Plan is to discuss her case in our tumor conference.  She will also be referred for surgical evaluation.  She may need laparoscopic staging given PET scan findings.  INTERVAL HISTORY:  Discussed the use of AI scribe software for clinical note transcription with the patient, who gave verbal consent to proceed.  History of Present Illness Gina Key is a 78 year old female with pancreatic adenocarcinoma who presents for oncology consultation. She is accompanied by  her daughter, [name] Schaffer.  She has pancreatic adenocarcinoma confirmed by a biopsy and PET scan, which revealed a tumor in the pancreas and activity in the gastrohepatic ligament. No evidence of disease is present in the liver or other organs. The PET scan findings are new compared to an MRI from last month, which did not show the spot in the gastrohepatic ligament.  She experiences intermittent abdominal pain, described as 'gas pain,' which began after the biopsy. The pain is located in the abdominal area and was not present prior to the biopsy. She is currently taking oxycodone  for end-stage osteoarthritis.  Her past medical history includes a blood clot in March, treated with Eliquis , which she stopped around the time of the biopsy due to low hemoglobin levels. There is concern about further clotting, given the association between pancreatic cancer and blood clots.  Her mother had pancreatic cancer diagnosed at age 29 and passed away six months later, raising concerns about a potential genetic predisposition.  She reports occasional diarrhea, attributed to not having a gallbladder. No recent episodes of nausea, vomiting, or changes in bowel movements. Her appetite has been stable, although she notes a decrease since the cancer diagnosis.  She has a history of hematuria, which led to the initial discovery of the cancer. She recalls multiple episodes of hematuria, prompting further investigation and ultimately leading to the cancer diagnosis.    MEDICAL HISTORY:  Past Medical History:  Diagnosis Date   Acid reflux    Allergy    Anxiety    DDD (degenerative disc disease), lumbar    Depression    Facet joint disease    GERD (gastroesophageal reflux disease)    High cholesterol    Hypertension    Low vitamin D  level    OSA (obstructive sleep apnea)    Primary osteoarthritis involving multiple joints    Pulmonary embolism (HCC)    Seizures (HCC)    Sleep apnea    C PAP     SURGICAL HISTORY: Past Surgical History:  Procedure Laterality Date   BREAST BIOPSY Right    benign   CHOLECYSTECTOMY     ESOPHAGOGASTRODUODENOSCOPY N/A 03/03/2024   Procedure: EGD (ESOPHAGOGASTRODUODENOSCOPY);  Surgeon: Wilhelmenia Aloha Raddle., MD;  Location: [facility];  Service: Gastroenterology;  Laterality: N/A;   EUS N/A 03/03/2024   Procedure: ULTRASOUND, UPPER GI TRACT, ENDOSCOPIC;  Surgeon: Wilhelmenia Aloha Raddle., MD;  Location: [facility];  Service: Gastroenterology;  Laterality: N/A;   FINE NEEDLE ASPIRATION  03/03/2024   Procedure: FINE NEEDLE ASPIRATION;  Surgeon: Wilhelmenia, Aloha Raddle., MD;  Location: [facility];  Service: Gastroenterology;;   left and right rotator cuff      SOCIAL HISTORY: She reports that she has never smoked. She has never used smokeless tobacco. She reports that she does not currently use alcohol. She reports that she does not use drugs. Social History   Socioeconomic History   Marital status: Widowed    Spouse name: Not on file   Number of children: 6   Years of education: 45   Highest education level: Not on file  Occupational History   Not on file  Tobacco Use   Smoking status: Never   Smokeless tobacco: Never  Vaping Use  Vaping status: Never Used  Substance and Sexual Activity   Alcohol use: Not Currently   Drug use: Never   Sexual activity: Not on file  Other Topics Concern   Not on file  Social History Narrative   Lives at home with daughter   Right handed   Caffeine: none    Social Drivers of Corporate investment banker Strain: Low Risk  (02/20/2024)   Overall Financial Resource Strain (CARDIA)    Difficulty of Paying Living Expenses: Not hard at all  Food Insecurity: No Food Insecurity (03/17/2024)   Hunger Vital Sign    Worried About Running Out of Food in the Last Year: Never true    Ran Out of Food in the Last Year: Never true  Transportation Needs: No Transportation Needs (03/17/2024)   PRAPARE -  Administrator, Civil Service (Medical): No    Lack of Transportation (Non-Medical): No  Physical Activity: Inactive (02/20/2024)   Exercise Vital Sign    Days of Exercise per Week: 0 days    Minutes of Exercise per Session: 0 min  Stress: No Stress Concern Present (02/20/2024)   [employer] of Occupational Health - Occupational Stress Questionnaire    Feeling of Stress: Not at all  Social Connections: Moderately Integrated (02/20/2024)   Social Connection and Isolation Panel    Frequency of Communication with Friends and Family: More than three times a week    Frequency of Social Gatherings with Friends and Family: More than three times a week    Attends Religious Services: More than 4 times per year    Active Member of [employer] or Organizations: Yes    Attends Banker Meetings: More than 4 times per year    Marital Status: Widowed  Intimate Partner Violence: Not At Risk (03/17/2024)   Humiliation, Afraid, Rape, and Kick questionnaire    Fear of Current or Ex-Partner: No    Emotionally Abused: No    Physically Abused: No    Sexually Abused: No    FAMILY HISTORY: Family History  Problem Relation Age of Onset   Pancreatic cancer Mother    Thyroid  cancer Sister    Colon polyps Daughter    Kidney cancer Daughter    Sleep apnea Daughter    Breast cancer Maternal Aunt 39   Seizures Paternal Grandmother    Cancer Brother        tongue   Sleep apnea Son    Stroke Son    Seizures Son    Sleep apnea Son    Seizures Grandson    Colon cancer Neg Hx    Crohn's disease Neg Hx    Esophageal cancer Neg Hx    Rectal cancer Neg Hx    Stomach cancer Neg Hx    Ulcerative colitis Neg Hx    Dementia Neg Hx     ALLERGIES:  She has no known allergies.  MEDICATIONS:  Current Outpatient Medications  Medication Sig Dispense Refill   apixaban  (ELIQUIS ) 5 MG TABS tablet Take 1 tablet (5 mg total) by mouth 2 (two) times daily. 180 tablet 1   [Paused] aspirin 325 MG  EC tablet Take 325 mg by mouth daily.     ferrous sulfate 325 (65 FE) MG tablet Take 325 mg by mouth at bedtime.     Fexofenadine HCl (ALLEGRA PO) Take by mouth.     Incontinence Supply Disposable (WINGS HL ADULT BRIEFS/XL) MISC As needed daily. 100 each 11   ipratropium (ATROVENT )  0.06 % nasal spray PLACE 2 SPRAYS INTO BOTH NOSTRILS 4 TIMES DAILY. 45 mL 1   levETIRAcetam  (KEPPRA ) 500 MG tablet Take 1 tablet (500 mg total) by mouth 2 (two) times daily. 180 tablet 3   lidocaine  (HM LIDOCAINE  PATCH) 4 % Place 1 patch onto the skin daily as needed.     losartan  (COZAAR ) 100 MG tablet TAKE 1 TABLET BY MOUTH EVERY DAY 90 tablet 1   metoprolol  succinate (TOPROL -XL) 25 MG 24 hr tablet TAKE 1 TABLET (25 MG TOTAL) BY MOUTH DAILY. 90 tablet 1   montelukast  (SINGULAIR ) 10 MG tablet TAKE 1 TABLET BY MOUTH EVERYDAY AT BEDTIME 90 tablet 1   omeprazole (PRILOSEC) 10 MG capsule Take 2 capsules (20 mg total) by mouth daily. 30 capsule 2   Oxycodone  HCl 10 MG TABS Take 1 tablet (10 mg total) by mouth every 12 (twelve) hours as needed. 30 tablet 0   potassium chloride  (KLOR-CON ) 10 MEQ tablet TAKE 1 TABLET BY MOUTH EVERY DAY 90 tablet 0   rosuvastatin  (CRESTOR ) 20 MG tablet Take 1 tablet (20 mg total) by mouth daily. 90 tablet 3   spironolactone  (ALDACTONE ) 25 MG tablet TAKE 1 TABLET (25 MG TOTAL) BY MOUTH DAILY. 90 tablet 1   topiramate  (TOPAMAX ) 100 MG tablet TAKE 1 TABLET BY MOUTH TWICE A DAY 180 tablet 1   Current Facility-Administered Medications  Medication Dose Route Frequency Provider Last Rate Last Admin   lidocaine  (XYLOCAINE ) 1 % (with pres) injection 1 mL  1 mL Other Once        lidocaine  (XYLOCAINE ) 1 % (with pres) injection 4 mL  4 mL Other Once        triamcinolone  acetonide (KENALOG -40) injection 40 mg  40 mg Intramuscular Once        triamcinolone  acetonide (KENALOG -40) injection 40 mg  40 mg Intra-articular Once         REVIEW OF SYSTEMS:    Review of Systems - Oncology  All other  pertinent systems were reviewed with the patient and are negative.  PHYSICAL EXAMINATION:    Onc Performance Status - 03/18/24 1600       ECOG Perf Status   ECOG Perf Status Ambulatory and capable of all selfcare but unable to carry out any work activities.  Up and about more than 50% of waking hours      KPS SCALE   KPS % SCORE Cares for self, unable to carry on normal activity or to do active work          Vitals:   03/14/24 1510  BP: 116/63  Pulse: 69  Resp: 17  Temp: (!) 97.5 F (36.4 C)  SpO2: 100%   Filed Weights   03/14/24 1510  Weight: 209 lb (94.8 kg)    Physical Exam Constitutional:      General: She is not in acute distress.    Appearance: Normal appearance.  HENT:     Head: Normocephalic and atraumatic.  Cardiovascular:     Rate and Rhythm: Normal rate.  Pulmonary:     Effort: Pulmonary effort is normal. No respiratory distress.  Abdominal:     General: There is no distension.  Neurological:     General: No focal deficit present.     Mental Status: She is alert and oriented to person, place, and time.  Psychiatric:        Mood and Affect: Mood normal.        Behavior: Behavior normal.  LABORATORY DATA:   I have reviewed the data as listed.  No results found for any visits on 03/14/24.  Lab Results  Component Value Date   WBC 8.0 02/17/2024   HGB 10.6 (L) 02/17/2024   HCT 35.2 (L) 02/17/2024   MCV 87.8 02/17/2024   PLT 188 02/17/2024   Recent Labs    02/05/24 1153 02/12/24 1355 02/17/24 0615  NA 141 144 145  K 3.9 4.2 4.1  CL 112* 114* 114*  CO2 22 26 23   GLUCOSE 80 84 104*  BUN 12 11 14   CREATININE 0.82 0.81 0.93  CALCIUM  9.3 9.6 9.3  GFRNONAA >60 >60 >60  PROT 6.9 7.2 6.4*  ALBUMIN 3.8 4.2 4.1  AST 19 20 20   ALT 18 20 17   ALKPHOS 100 107 117  BILITOT 0.5 0.4 0.2    RADIOGRAPHIC STUDIES:  I have personally reviewed the radiological images as listed and agree with the findings in the report.  NM PET Image  Initial (PI) Skull Base To Thigh Result Date: 03/11/2024 CLINICAL DATA:  Initial treatment strategy for pancreatic adenocarcinoma. EXAM: NUCLEAR MEDICINE PET SKULL BASE TO THIGH TECHNIQUE: 10.2 mCi F-18 FDG was injected intravenously. Full-ring PET imaging was performed from the skull base to thigh after the radiotracer. CT data was obtained and used for attenuation correction and anatomic localization. Fasting blood glucose: 104 mg/dl COMPARISON:  MRI abdomen 02/07/2024. CT abdomen and pelvis 02/05/2024. FINDINGS: Mediastinal blood pool activity: SUV max 3.9 Liver activity: SUV max NA NECK: No hypermetabolic lymph nodes in the neck. Incidental CT findings: None. CHEST: No hypermetabolic mediastinal or hilar nodes. No suspicious pulmonary nodules on the CT scan. Incidental CT findings: Mild atherosclerotic calcification is noted in the wall of the thoracic aorta. Coronary artery calcification is evident. No pericardial effusion. No pleural effusion. ABDOMEN/PELVIS: Ill-defined heterogeneous low level hypermetabolism identified in the mid pancreas, corresponding to the abnormality on previous MRI. SUV max = 7.4. 16 x 11 mm focus of amorphous soft tissue in the gastrohepatic ligament (image 95/4) is hypermetabolic with SUV max = 7.4. No hypermetabolic lymphadenopathy in the abdomen. No hypermetabolic liver lesion evident. No suspicious focal hypermetabolism elsewhere in the abdomen or pelvis. Incidental CT findings: Cholecystectomy. There is mild atherosclerotic calcification of the abdominal aorta without aneurysm. SKELETON: No focal hypermetabolic activity to suggest skeletal metastasis. Incidental CT findings: No worrisome lytic or sclerotic osseous abnormality. IMPRESSION: 1. Ill-defined heterogeneous low level hypermetabolism in the mid pancreas, corresponding to the abnormality on previous MRI. Imaging features compatible with biopsy-proven pancreatic adenocarcinoma. 2. 16 x 11 mm focus of amorphous soft tissue  in the gastrohepatic ligament is hypermetabolic and compatible with metastatic disease. 3. No evidence for hypermetabolic liver metastases. No evidence for hypermetabolic metastatic disease in the neck, chest, or pelvis. 4.  Aortic Atherosclerosis (ICD10-I70.0). Electronically Signed   By: Camellia Candle M.D.   On: 03/11/2024 08:46    No orders of the defined types were placed in this encounter.   CODE STATUS:  Code Status History     Date Active Date Inactive Code Status Order ID Comments User Context   10/05/2023 0213 10/06/2023 1942 Full Code [id number]  Lee Kingfisher, MD ED    Questions for Most Recent Historical Code Status (Order [id number])     Question Answer   By: Consent: discussion documented in EHR            Future Appointments  Date Time Provider Department Center  03/21/2024  9:00 AM [facility] None  03/21/2024  9:30 AM Autumn Millman, MD [facility] None  03/28/2024  4:00 PM [name], MD [facility]  04/21/2024 10:40 AM Urbano Albright, MD [facility] CPR  06/11/2024  7:45 AM Buck Saucer, MD [facility] None  02/25/2025  1:40 PM [facility] WE[facility] VISIT [facility]     I spent a total of 50 minutes during this encounter with the patient including review of chart and various tests results, discussions about plan of care and coordination of care plan.  This document was completed utilizing speech recognition software. Grammatical errors, random word insertions, pronoun errors, and incomplete sentences are an occasional consequence of this system due to software limitations, ambient noise, and hardware issues. Any formal questions or concerns about the content, text or information contained within the body of this dictation should be directly addressed to the provider for clarification.

## 2024-03-14 NOTE — Progress Notes (Signed)
 PATIENT NAVIGATOR PROGRESS NOTE  Name: Gina Key Date: 03/14/2024 MRN: [id number]  DOB: 10-Jun-1946   Reason for visit:  Initial appt with Dr. Chinita Pasam  Comments:   Patient was seen during her initial Med/Onc appt with Dr. Autumn.  Patient was accompanied by her daughter. Patient was given Journey's binder with information specific to her diagnosis.  Patient was given my direct contact information and was instructed to contact office if she had any questions or concerns after today. Patient and daughter verbalized understanding.   Referrals were entered for the following:  Social Work Spiritual Care  Patient has surgical evaluation scheduled for Thursday 8/28 and follow-up with Dr. Autumn on 8/29.  Will plan to see patient at her follow-up appt on 8/29.  Time spent counseling/coordinating care: > 60 minutes

## 2024-03-17 ENCOUNTER — Inpatient Hospital Stay

## 2024-03-17 ENCOUNTER — Other Ambulatory Visit: Payer: Self-pay | Admitting: Genetic Counselor

## 2024-03-17 DIAGNOSIS — Z1379 Encounter for other screening for genetic and chromosomal anomalies: Secondary | ICD-10-CM
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
## 2024-03-18 ENCOUNTER — Encounter: Payer: Self-pay | Admitting: General Practice

## 2024-03-18 ENCOUNTER — Encounter: Payer: Self-pay | Admitting: Oncology

## 2024-03-18 NOTE — Progress Notes (Signed)
 [facility] Spiritual Care Note  Referred by nurse navigator for additional layer of support. Reached out to Ms Etzkorn by phone, connecting with both her and the daughter with whom she stays here in [place].   Ms Boffa faith and her prayer practice are primary sources of meaning, purpose, and coping. She is involved in churches at her home and at her daughter's home locally.  Ms Ketter is focusing on thinking positively through her diagnosis and treatment, which feels empowering and uplifting to her. 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  Introduced Spiritual Care as part of family's support team, ensuring that they have chaplain's direct number and are aware of ongoing chaplain availability. We plan to follow up in person once a treatment plan becomes clear.  [address] [place], [place], [facility] Pager [phone] Voicemail [phone]

## 2024-03-18 NOTE — Assessment & Plan Note (Addendum)
 She was seen in our rapid diagnostic clinic on 02/12/2024 for consultation and was referred to GI for EUS/biopsy.  Labs on that day showed a CA 19-9 of 8, CEA undetectable, chromogranin A increased at 224.8 ng/mL.  CBCD and CMP were unremarkable with normal LFTs.  On 03/03/2024 Dr. Wilhelmenia performed EGD and EUS.  On EUS, mass was identified in the pancreatic body.  Endosonographic appearance was highly suspicious for adenocarcinoma.  Staged as T1c, N0, MX.  FNA performed.  There was no sign of significant pathology in the CBD and in the common hepatic duct.  No malignant appearing lymph nodes were visualized in the celiac region, peripancreatic region and porta hepatis.  A cyst was found in the visualized portion of the liver measured 26 mm x 23 mm.  No evidence of any solid mass lesions in the left lobe of liver.  FNA from pancreatic mass came back positive for adenocarcinoma.  On 03/11/2024, staging PET scan showed ill-defined heterogeneous low-level hypermetabolic in the mid pancreas, corresponding to the abnormality on previous MRI.  Features compatible with biopsy-proven pancreatic adenocarcinoma.  16 x 11 mm focus of amorphous soft tissue in the gastrohepatic ligament was hypermetabolic and concerning for metastatic disease.  No evidence of hypermetabolic liver metastasis.  No evidence of metastatic disease elsewhere.  Differential includes direct extension or lymph node involvement.   Plan is to discuss her case in our tumor conference.  Patient has an appointment with Dr. Aron on 03/20/2024 for surgical consultation.  She may need laparoscopic staging given PET scan findings.  If determined to be not a surgical candidate upfront, we will discuss neoadjuvant chemotherapy with gemcitabine and Abraxane.  I will plan to see her after her evaluation with Dr. Aron.  Referral sent for genetic testing.

## 2024-03-19 ENCOUNTER — Other Ambulatory Visit: Payer: Self-pay

## 2024-03-19 ENCOUNTER — Other Ambulatory Visit: Payer: Self-pay | Admitting: Oncology

## 2024-03-19 DIAGNOSIS — C259 Malignant neoplasm of pancreas, unspecified: Secondary | ICD-10-CM

## 2024-03-19 NOTE — Progress Notes (Signed)
 The proposed treatment discussed in conference is for discussion purpose only and is not a binding recommendation.  The patients have not been physically examined, or presented with their treatment options.  Therefore, final treatment plans cannot be decided.

## 2024-03-20 DIAGNOSIS — C258 Malignant neoplasm of overlapping sites of pancreas: Secondary | ICD-10-CM | POA: Diagnosis not present

## 2024-03-20 DIAGNOSIS — K8681 Exocrine pancreatic insufficiency: Secondary | ICD-10-CM | POA: Diagnosis not present

## 2024-03-20 DIAGNOSIS — R5381 Other malaise: Secondary | ICD-10-CM | POA: Diagnosis not present

## 2024-03-20 DIAGNOSIS — I2699 Other pulmonary embolism without acute cor pulmonale: Secondary | ICD-10-CM | POA: Diagnosis not present

## 2024-03-21 ENCOUNTER — Inpatient Hospital Stay

## 2024-03-21 ENCOUNTER — Encounter: Payer: Self-pay | Admitting: Oncology

## 2024-03-21 ENCOUNTER — Inpatient Hospital Stay: Admitting: Oncology

## 2024-03-21 VITALS — BP 124/67 | HR 72 | Temp 97.6°F | Resp 17 | Ht 62.0 in | Wt 209.0 lb

## 2024-03-21 DIAGNOSIS — C259 Malignant neoplasm of pancreas, unspecified: Secondary | ICD-10-CM

## 2024-03-21 DIAGNOSIS — Z803 Family history of malignant neoplasm of breast: Secondary | ICD-10-CM | POA: Diagnosis not present

## 2024-03-21 DIAGNOSIS — Z800 Family history of malignant neoplasm of digestive organs: Secondary | ICD-10-CM | POA: Diagnosis not present

## 2024-03-21 DIAGNOSIS — D509 Iron deficiency anemia, unspecified: Secondary | ICD-10-CM | POA: Diagnosis not present

## 2024-03-21 DIAGNOSIS — Z7901 Long term (current) use of anticoagulants: Secondary | ICD-10-CM | POA: Diagnosis not present

## 2024-03-21 DIAGNOSIS — Z7982 Long term (current) use of aspirin: Secondary | ICD-10-CM | POA: Diagnosis not present

## 2024-03-21 DIAGNOSIS — Z86718 Personal history of other venous thrombosis and embolism: Secondary | ICD-10-CM | POA: Diagnosis not present

## 2024-03-21 DIAGNOSIS — Z8507 Personal history of malignant neoplasm of pancreas: Secondary | ICD-10-CM | POA: Diagnosis not present

## 2024-03-21 DIAGNOSIS — Z8051 Family history of malignant neoplasm of kidney: Secondary | ICD-10-CM | POA: Diagnosis not present

## 2024-03-21 DIAGNOSIS — Z808 Family history of malignant neoplasm of other organs or systems: Secondary | ICD-10-CM | POA: Diagnosis not present

## 2024-03-21 DIAGNOSIS — Z0000 Encounter for general adult medical examination without abnormal findings: Secondary | ICD-10-CM | POA: Diagnosis not present

## 2024-03-21 DIAGNOSIS — Z1379 Encounter for other screening for genetic and chromosomal anomalies: Secondary | ICD-10-CM

## 2024-03-21 DIAGNOSIS — Z86711 Personal history of pulmonary embolism: Secondary | ICD-10-CM | POA: Diagnosis not present

## 2024-03-21 DIAGNOSIS — Z79899 Other long term (current) drug therapy: Secondary | ICD-10-CM | POA: Diagnosis not present

## 2024-03-21 LAB — CBC WITH DIFFERENTIAL (CANCER CENTER ONLY)
Abs Immature Granulocytes: 0.01 K/uL (ref 0.00–0.07)
Basophils Absolute: 0 K/uL (ref 0.0–0.1)
Basophils Relative: 1 %
Eosinophils Absolute: 0.1 K/uL (ref 0.0–0.5)
Eosinophils Relative: 2 %
HCT: 38.5 % (ref 36.0–46.0)
Hemoglobin: 11.7 g/dL — ABNORMAL LOW (ref 12.0–15.0)
Immature Granulocytes: 0 %
Lymphocytes Relative: 25 %
Lymphs Abs: 2 K/uL (ref 0.7–4.0)
MCH: 26.6 pg (ref 26.0–34.0)
MCHC: 30.4 g/dL (ref 30.0–36.0)
MCV: 87.5 fL (ref 80.0–100.0)
Monocytes Absolute: 0.3 K/uL (ref 0.1–1.0)
Monocytes Relative: 4 %
Neutro Abs: 5.4 K/uL (ref 1.7–7.7)
Neutrophils Relative %: 68 %
Platelet Count: 208 K/uL (ref 150–400)
RBC: 4.4 MIL/uL (ref 3.87–5.11)
RDW: 13.2 % (ref 11.5–15.5)
WBC Count: 7.9 K/uL (ref 4.0–10.5)
nRBC: 0 % (ref 0.0–0.2)

## 2024-03-21 LAB — CMP (CANCER CENTER ONLY)
ALT: 24 U/L (ref 0–44)
AST: 22 U/L (ref 15–41)
Albumin: 4.1 g/dL (ref 3.5–5.0)
Alkaline Phosphatase: 107 U/L (ref 38–126)
Anion gap: 7 (ref 5–15)
BUN: 14 mg/dL (ref 8–23)
CO2: 23 mmol/L (ref 22–32)
Calcium: 9.5 mg/dL (ref 8.9–10.3)
Chloride: 111 mmol/L (ref 98–111)
Creatinine: 0.86 mg/dL (ref 0.44–1.00)
GFR, Estimated: >60 mL/min (ref >60)
Glucose, Bld: 132 mg/dL — ABNORMAL HIGH (ref 70–99)
Potassium: 3.9 mmol/L (ref 3.5–5.1)
Sodium: 141 mmol/L (ref 135–145)
Total Bilirubin: 0.5 mg/dL (ref 0.0–1.2)
Total Protein: 7.4 g/dL (ref 6.5–8.1)

## 2024-03-21 LAB — GENETIC SCREENING ORDER

## 2024-03-21 LAB — LIPASE, BLOOD: Lipase: 25 U/L (ref 11–51)

## 2024-03-21 MED ORDER — ONDANSETRON HCL 8 MG PO TABS: 8.0000 mg | ORAL_TABLET | Freq: Three times a day (TID) | ORAL | 1 refills | Status: AC | PRN

## 2024-03-21 MED ORDER — PROCHLORPERAZINE MALEATE 10 MG PO TABS: 10.0000 mg | ORAL_TABLET | Freq: Four times a day (QID) | ORAL | 1 refills | Status: AC | PRN

## 2024-03-21 MED ORDER — LIDOCAINE-PRILOCAINE 2.5-2.5 % EX CREA: TOPICAL_CREAM | CUTANEOUS | 3 refills | Status: AC

## 2024-03-21 NOTE — Progress Notes (Signed)
 [facility]  ONCOLOGY CLINIC PROGRESS NOTE   Patient Care Team: [name], MD as PCP - General (Family Medicine) Ardis, Evalene CROME, RN as Oncology Nurse Navigator Mansouraty, Aloha Raddle., MD as Consulting Physician (Gastroenterology)  PATIENT NAME: Gina Key   MR#: [id number] DOB: 07/27/1945  Date of visit: 03/21/2024   ASSESSMENT & PLAN:   Gina Key is a 78 y.o.  lady with a past medical history of hypertension, history of pulmonary embolism in March 2025, seizure disorder, obstructive sleep apnea, arthritis, GERD, was referred to our clinic in August 2025 for pancreatic adenocarcinoma.   Pancreatic adenocarcinoma [facility]) She was seen in our rapid diagnostic clinic on 02/12/2024 for consultation and was referred to GI for EUS/biopsy.  Labs on that day showed a CA 19-9 of 8, CEA undetectable, chromogranin A increased at 224.8 ng/mL.  CBCD and CMP were unremarkable with normal LFTs.  On 03/03/2024 Dr. Wilhelmenia performed EGD and EUS.  On EUS, mass was identified in the pancreatic body.  Endosonographic appearance was highly suspicious for adenocarcinoma.  Staged as T1c, N0, MX.  FNA performed.  There was no sign of significant pathology in the CBD and in the common hepatic duct.  No malignant appearing lymph nodes were visualized in the celiac region, peripancreatic region and porta hepatis.  A cyst was found in the visualized portion of the liver measured 26 mm x 23 mm.  No evidence of any solid mass lesions in the left lobe of liver.  FNA from pancreatic mass came back positive for adenocarcinoma.  On 03/11/2024, staging PET scan showed ill-defined heterogeneous low-level hypermetabolic in the mid pancreas, corresponding to the abnormality on previous MRI.  Features compatible with biopsy-proven pancreatic adenocarcinoma.  16 x 11 mm focus of amorphous soft tissue in the gastrohepatic ligament was hypermetabolic and concerning for metastatic disease.   No evidence of hypermetabolic liver metastasis.  No evidence of metastatic disease elsewhere.  Her case was discussed in GI tumor conference on 03/19/2024. Uncertainty remains regarding the second spot in the gastrohepatic ligament, which may be post-biopsy inflammation rather than cancer.   Patient had consultation with Dr. Aron on 03/20/2024.  Surgery was not recommended at this time due to potential complications and her preference against it.   Clinical stage I versus stage II.  Plan for neoadjuvant chemotherapy with gemcitabine and Abraxane, dose reduced for her age and comorbidities, followed by surgical evaluation.  Initially will plan for chemotherapy on days 1, 15 of 28-day cycle and if well-tolerated, we will attempt to do chemo on days 1, 8, 15 of 28-day cycle.  Will start with 25% dose reduction of gemcitabine and Abraxane.  Will arrange for formal chemo education, Port-A-Cath placement and then plan to begin treatment in the next 1 to 2 weeks.  Referral sent for genetic testing.  Fatigue and decreased functional status secondary to cancer Fatigue and decreased functional status are likely secondary to cancer and recent venous thromboembolism. She can perform some daily activities but experiences significant fatigue after exertion. - Refer to physical therapy to improve strength and functional status.  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 Blood counts are well-managed, with hemoglobin improved to 11.7 g/dL. - Continue Eliquis  for anticoagulation. - Monitor blood counts regularly during chemotherapy.  Suspected post-biopsy inflammation in gastrohepatic ligament Suspected post-biopsy inflammation in the gastrohepatic ligament, with uncertainty about whether it represents cancer. PET scan showed activity, but MRI and endoscopic ultrasound did  not confirm cancer. Radiologist suggests it may be inflammation rather than  cancer. - Monitor for signs of pancreatitis with additional labs, including lipase levels.  I reviewed lab results and outside records for this visit and discussed relevant results with the patient. Diagnosis, plan of care and treatment options were also discussed in detail with the patient. Opportunity provided to ask questions and answers provided to her apparent satisfaction. Provided instructions to call our clinic with any problems, questions or concerns prior to return visit. I recommended to continue follow-up with PCP and sub-specialists. She verbalized understanding and agreed with the plan.   NCCN guidelines have been consulted in the planning of this patient's care.  I spent a total of 40 minutes during this encounter with the patient including review of chart and various tests results, discussions about plan of care and coordination of care plan.   [name], MD  03/21/2024 6:05 PM  [facility] - A DEPT OF [facility] [address] [place] [address] Dept: [phone] Dept Fax: [phone]    CHIEF COMPLAINT/ REASON FOR VISIT:   Adenocarcinoma of pancreas, Stage I vs stage II  Current Treatment: Plan for neoadjuvant chemotherapy with dose reduce gemcitabine and Abraxane, followed by surgical evaluation.  INTERVAL HISTORY:    Discussed the use of AI scribe software for clinical note transcription with the patient, who gave verbal consent to proceed.  History of Present Illness Gina Key is a 78 year old female with pancreatic cancer who presents for chemotherapy consultation. She has been diagnosed with pancreatic cancer, and a Whipple procedure was discussed but not recommended due to potential complications. Chemotherapy is planned as the initial treatment. She has a history of a blood clot, which has impacted her energy levels. Since the blood clot, she experiences shortness of breath and  fatigue, particularly after exertion. She is able to perform some household activities like laundry but finds tasks like making her bed too exhausting. After a day of activity, she feels drained the following day.  Recent lab work shows improvement in hemoglobin levels from 10.6 to 11.7, with normal white blood cell count, platelets, liver function, electrolytes, and kidney function. She has resumed taking Eliquis , a blood thinner. A recent PET scan showed a spot in the gastrohepatic ligament, which was not visible on MRI or endoscopic ultrasound. Further lab tests for pancreatitis are pending.  She is currently not using a walker and remains active during the day, although she tires easily. She is the first to wake up in the house, takes a bath, and engages in activities like watching TV and doing laundry to keep moving.   I have reviewed the past medical history, past surgical history, social history and family history with the patient and they are unchanged from previous note.  HISTORY OF PRESENT ILLNESS:   ONCOLOGY HISTORY:   On review of the previous records patient was evaluated in the emergency department on 02/05/2024 for gross hematuria.  CT abdomen pelvis was performed which showed moderate dilation of the pancreatic duct in the mid body and tail overlying and parenchymal atrophy. MR abdomen was ordered by PCP Dr. Mercer and performed on 02/07/24. MRI showed a focal segment of pancreatic ductal dilatation with abrupt caliber change and stricture. Subtle mass lesion in this location identified measuring 17 x 15 mm at the level of the caliber change and has appearance worrisome for neoplasm such as an adenocarcinoma.  Image did not show any other areas concerning for malignant disease.    CMP collected 02/05/24 in the ED was overall unremarkable. Further chart review shows patient was diagnosed with a submassive PE with right heart strain 10/05/23 and is currently taking Eliquis .     She is  retired, having worked in [employer]. She does not smoke or drink alcohol. She currently lives with her daughter due to past seizures. Her last colonoscopy in February 2024 was normal, as was her last mammogram. Her family history is significant for her mother having pancreatic cancer at age 81, her brother having throat cancer, and her daughter having kidney cancer.   She was seen in our rapid diagnostic clinic on 02/12/2024 for consultation and was referred to GI for EUS/biopsy.  Labs on that day showed a CA 19-9 of 8, CEA undetectable, chromogranin A increased at 224.8 ng/mL.  CBCD and CMP were unremarkable with normal LFTs.   On 03/03/2024 Dr. Wilhelmenia performed EGD and EUS.  On EUS, mass was identified in the pancreatic body.  Endosonographic appearance was highly suspicious for adenocarcinoma.  Staged as T1c, N0, MX.  FNA performed.  There was no sign of significant pathology in the CBD and in the common hepatic duct.  No malignant appearing lymph nodes were visualized in the celiac region, peripancreatic region and porta hepatis.  A cyst was found in the visualized portion of the liver measured 26 mm x 23 mm.  No evidence of any solid mass lesions in the left lobe of liver.   FNA from pancreatic mass came back positive for adenocarcinoma.   On 03/11/2024, staging PET scan showed ill-defined heterogeneous low-level hypermetabolic in the mid pancreas, corresponding to the abnormality on previous MRI.  Features compatible with biopsy-proven pancreatic adenocarcinoma.  16 x 11 mm focus of amorphous soft tissue in the gastrohepatic ligament was hypermetabolic and concerning for metastatic disease.  No evidence of hypermetabolic liver metastasis.  No evidence of metastatic disease elsewhere.   Her case was discussed in GI tumor conference on 03/19/2024. Uncertainty remains regarding the second spot in the gastrohepatic ligament, which may be post-biopsy inflammation rather than cancer.   Patient  had consultation with Dr. Aron on 03/20/2024.  Surgery was not recommended at this time due to potential complications and her preference against it.   Plan for neoadjuvant chemotherapy with gemcitabine and Abraxane, dose reduced for her age and comorbidities, followed by surgical evaluation.  Oncology History  Pancreatic adenocarcinoma (HCC)  03/03/2024 Initial Diagnosis   Pancreatic adenocarcinoma (HCC)   03/21/2024 Cancer Staging   Staging form: Exocrine Pancreas, AJCC 8th Edition - Clinical: Stage IA (cT1c, cN0, cM0) - Signed by Autumn Millman, MD on 03/21/2024 Total positive nodes: 0   04/02/2024 -  Chemotherapy   Patient is on Treatment Plan : PANCREATIC Abraxane D1,15 + Gemcitabine D1,15 q28d         REVIEW OF SYSTEMS:   Review of Systems - Oncology  All other pertinent systems were reviewed with the patient and are negative.  ALLERGIES: She has no known allergies.  MEDICATIONS:  Current Outpatient Medications  Medication Sig Dispense Refill   apixaban  (ELIQUIS ) 5 MG TABS tablet Take 1 tablet (5 mg total) by mouth 2 (two) times daily. 180 tablet 1   ferrous sulfate 325 (65 FE) MG tablet Take 325 mg by mouth at bedtime.     Fexofenadine HCl (ALLEGRA PO) Take by mouth.     Incontinence Supply Disposable (WINGS HL ADULT BRIEFS/XL) MISC  As needed daily. 100 each 11   ipratropium (ATROVENT ) 0.06 % nasal spray PLACE 2 SPRAYS INTO BOTH NOSTRILS 4 TIMES DAILY. 45 mL 1   levETIRAcetam  (KEPPRA ) 500 MG tablet Take 1 tablet (500 mg total) by mouth 2 (two) times daily. 180 tablet 3   lidocaine  (HM LIDOCAINE  PATCH) 4 % Place 1 patch onto the skin daily as needed.     losartan  (COZAAR ) 100 MG tablet TAKE 1 TABLET BY MOUTH EVERY DAY 90 tablet 1   metoprolol  succinate (TOPROL -XL) 25 MG 24 hr tablet TAKE 1 TABLET (25 MG TOTAL) BY MOUTH DAILY. 90 tablet 1   montelukast  (SINGULAIR ) 10 MG tablet TAKE 1 TABLET BY MOUTH EVERYDAY AT BEDTIME 90 tablet 1   omeprazole (PRILOSEC) 10 MG capsule Take  2 capsules (20 mg total) by mouth daily. 30 capsule 2   Oxycodone  HCl 10 MG TABS Take 1 tablet (10 mg total) by mouth every 12 (twelve) hours as needed. 30 tablet 0   potassium chloride  (KLOR-CON ) 10 MEQ tablet TAKE 1 TABLET BY MOUTH EVERY DAY 90 tablet 0   rosuvastatin  (CRESTOR ) 20 MG tablet Take 1 tablet (20 mg total) by mouth daily. 90 tablet 3   spironolactone  (ALDACTONE ) 25 MG tablet TAKE 1 TABLET (25 MG TOTAL) BY MOUTH DAILY. 90 tablet 1   topiramate  (TOPAMAX ) 100 MG tablet TAKE 1 TABLET BY MOUTH TWICE A DAY 180 tablet 1   lidocaine -prilocaine  (EMLA ) cream Apply to affected area once 30 g 3   ondansetron  (ZOFRAN ) 8 MG tablet Take 1 tablet (8 mg total) by mouth every 8 (eight) hours as needed for nausea or vomiting. 30 tablet 1   prochlorperazine  (COMPAZINE ) 10 MG tablet Take 1 tablet (10 mg total) by mouth every 6 (six) hours as needed for nausea or vomiting. 30 tablet 1   Current Facility-Administered Medications  Medication Dose Route Frequency Provider Last Rate Last Admin   lidocaine  (XYLOCAINE ) 1 % (with pres) injection 1 mL  1 mL Other Once        lidocaine  (XYLOCAINE ) 1 % (with pres) injection 4 mL  4 mL Other Once        triamcinolone  acetonide (KENALOG -40) injection 40 mg  40 mg Intramuscular Once        triamcinolone  acetonide (KENALOG -40) injection 40 mg  40 mg Intra-articular Once          VITALS:   Blood pressure 124/67, pulse 72, temperature 97.6 F (36.4 C), temperature source Temporal, resp. rate 17, height 5' 2 (1.575 m), weight 209 lb (94.8 kg), SpO2 100%.  Wt Readings from Last 3 Encounters:  03/21/24 209 lb (94.8 kg)  03/14/24 209 lb (94.8 kg)  03/03/24 206 lb (93.4 kg)    Body mass index is 38.23 kg/m.    Onc Performance Status - 03/21/24 0929       ECOG Perf Status   ECOG Perf Status Capable of only limited selfcare, confined to bed or chair more than 50% of waking hours      KPS SCALE   KPS % SCORE Cares for self, unable to carry on normal activity  or to do active work          PHYSICAL EXAM:   Physical Exam Constitutional:      General: She is not in acute distress.    Appearance: Normal appearance.  HENT:     Head: Normocephalic and atraumatic.  Cardiovascular:     Rate and Rhythm: Normal rate.  Pulmonary:  Effort: Pulmonary effort is normal. No respiratory distress.  Abdominal:     General: There is no distension.  Neurological:     General: No focal deficit present.     Mental Status: She is alert and oriented to person, place, and time.  Psychiatric:        Mood and Affect: Mood normal.        Behavior: Behavior normal.       LABORATORY DATA:   I have reviewed the data as listed.  Results for orders placed or performed in visit on 03/21/24  Lipase, blood  Result Value Ref Range   Lipase 25 11 - 51 U/L  CMP (Cancer Center only)  Result Value Ref Range   Sodium 141 135 - 145 mmol/L   Potassium 3.9 3.5 - 5.1 mmol/L   Chloride 111 98 - 111 mmol/L   CO2 23 22 - 32 mmol/L   Glucose, Bld 132 (H) 70 - 99 mg/dL   BUN 14 8 - 23 mg/dL   Creatinine 9.13 9.55 - 1.00 mg/dL   Calcium  9.5 8.9 - 10.3 mg/dL   Total Protein 7.4 6.5 - 8.1 g/dL   Albumin 4.1 3.5 - 5.0 g/dL   AST 22 15 - 41 U/L   ALT 24 0 - 44 U/L   Alkaline Phosphatase 107 38 - 126 U/L   Total Bilirubin 0.5 0.0 - 1.2 mg/dL   GFR, Estimated >39 >39 mL/min   Anion gap 7 5 - 15  CBC with Differential (Cancer Center Only)  Result Value Ref Range   WBC Count 7.9 4.0 - 10.5 K/uL   RBC 4.40 3.87 - 5.11 MIL/uL   Hemoglobin 11.7 (L) 12.0 - 15.0 g/dL   HCT 61.4 63.9 - 53.9 %   MCV 87.5 80.0 - 100.0 fL   MCH 26.6 26.0 - 34.0 pg   MCHC 30.4 30.0 - 36.0 g/dL   RDW 86.7 88.4 - 84.4 %   Platelet Count 208 150 - 400 K/uL   nRBC 0.0 0.0 - 0.2 %   Neutrophils Relative % 68 %   Neutro Abs 5.4 1.7 - 7.7 K/uL   Lymphocytes Relative 25 %   Lymphs Abs 2.0 0.7 - 4.0 K/uL   Monocytes Relative 4 %   Monocytes Absolute 0.3 0.1 - 1.0 K/uL   Eosinophils Relative  2 %   Eosinophils Absolute 0.1 0.0 - 0.5 K/uL   Basophils Relative 1 %   Basophils Absolute 0.0 0.0 - 0.1 K/uL   Immature Granulocytes 0 %   Abs Immature Granulocytes 0.01 0.00 - 0.07 K/uL  Genetic Screening Order  Result Value Ref Range   Genetic Screening Order Collected by Laboratory       RADIOGRAPHIC STUDIES:  I have personally reviewed the radiological images as listed and agree with the findings in the report.  NM PET Image Initial (PI) Skull Base To Thigh Result Date: 03/11/2024 CLINICAL DATA:  Initial treatment strategy for pancreatic adenocarcinoma. EXAM: NUCLEAR MEDICINE PET SKULL BASE TO THIGH TECHNIQUE: 10.2 mCi F-18 FDG was injected intravenously. Full-ring PET imaging was performed from the skull base to thigh after the radiotracer. CT data was obtained and used for attenuation correction and anatomic localization. Fasting blood glucose: 104 mg/dl COMPARISON:  MRI abdomen 02/07/2024. CT abdomen and pelvis 02/05/2024. FINDINGS: Mediastinal blood pool activity: SUV max 3.9 Liver activity: SUV max NA NECK: No hypermetabolic lymph nodes in the neck. Incidental CT findings: None. CHEST: No hypermetabolic mediastinal or hilar nodes. No suspicious  pulmonary nodules on the CT scan. Incidental CT findings: Mild atherosclerotic calcification is noted in the wall of the thoracic aorta. Coronary artery calcification is evident. No pericardial effusion. No pleural effusion. ABDOMEN/PELVIS: Ill-defined heterogeneous low level hypermetabolism identified in the mid pancreas, corresponding to the abnormality on previous MRI. SUV max = 7.4. 16 x 11 mm focus of amorphous soft tissue in the gastrohepatic ligament (image 95/4) is hypermetabolic with SUV max = 7.4. No hypermetabolic lymphadenopathy in the abdomen. No hypermetabolic liver lesion evident. No suspicious focal hypermetabolism elsewhere in the abdomen or pelvis. Incidental CT findings: Cholecystectomy. There is mild atherosclerotic  calcification of the abdominal aorta without aneurysm. SKELETON: No focal hypermetabolic activity to suggest skeletal metastasis. Incidental CT findings: No worrisome lytic or sclerotic osseous abnormality. IMPRESSION: 1. Ill-defined heterogeneous low level hypermetabolism in the mid pancreas, corresponding to the abnormality on previous MRI. Imaging features compatible with biopsy-proven pancreatic adenocarcinoma. 2. 16 x 11 mm focus of amorphous soft tissue in the gastrohepatic ligament is hypermetabolic and compatible with metastatic disease. 3. No evidence for hypermetabolic liver metastases. No evidence for hypermetabolic metastatic disease in the neck, chest, or pelvis. 4.  Aortic Atherosclerosis (ICD10-I70.0). Electronically Signed   By: Camellia Candle M.D.   On: 03/11/2024 08:46      CODE STATUS:  Code Status History     Date Active Date Inactive Code Status Order ID Comments User Context   10/05/2023 0213 10/06/2023 1942 Full Code [id number]  Lee Kingfisher, MD ED    Questions for Most Recent Historical Code Status (Order [id number])     Question Answer   By: Consent: discussion documented in EHR            Orders Placed This Encounter  Procedures   Consent Attestation for Oncology Treatment    The patient is informed of risks, benefits, side-effects of the prescribed oncology treatment. Potential short term and long term side effects and response rates discussed. After a long discussion, the patient made informed decision to proceed.:   Yes   CBC with Differential (Cancer Center Only)    Standing Status:   Future    Expected Date:   04/02/2024    Expiration Date:   04/02/2025   CMP (Cancer Center only)    Standing Status:   Future    Expected Date:   04/02/2024    Expiration Date:   04/02/2025   CBC with Differential (Cancer Center Only)    Standing Status:   Future    Expected Date:   04/16/2024    Expiration Date:   04/16/2025   CMP (Cancer Center only)    Standing Status:    Future    Expected Date:   04/16/2024    Expiration Date:   04/16/2025     Future Appointments  Date Time Provider Department Center  03/28/2024  4:00 PM [name], MD [facility] [address]  04/21/2024 10:40 AM Urbano Albright, MD [facility]  06/11/2024  7:45 AM Buck Saucer, MD [facility] None  02/25/2025  1:40 PM [facility]NUAL WELLNESS VISIT [facility] [address]      This document was completed utilizing speech recognition software. Grammatical errors, random word insertions, pronoun errors, and incomplete sentences are an occasional consequence of this system due to software limitations, ambient noise, and hardware issues. Any formal questions or concerns about the content, text or information contained within the body of this dictation should be directly addressed to the provider for clarification.

## 2024-03-21 NOTE — Assessment & Plan Note (Signed)
 She was seen in our rapid diagnostic clinic on 02/12/2024 for consultation and was referred to GI for EUS/biopsy.  Labs on that day showed a CA 19-9 of 8, CEA undetectable, chromogranin A increased at 224.8 ng/mL.  CBCD and CMP were unremarkable with normal LFTs.  On 03/03/2024 Dr. Wilhelmenia performed EGD and EUS.  On EUS, mass was identified in the pancreatic body.  Endosonographic appearance was highly suspicious for adenocarcinoma.  Staged as T1c, N0, MX.  FNA performed.  There was no sign of significant pathology in the CBD and in the common hepatic duct.  No malignant appearing lymph nodes were visualized in the celiac region, peripancreatic region and porta hepatis.  A cyst was found in the visualized portion of the liver measured 26 mm x 23 mm.  No evidence of any solid mass lesions in the left lobe of liver.  FNA from pancreatic mass came back positive for adenocarcinoma.  On 03/11/2024, staging PET scan showed ill-defined heterogeneous low-level hypermetabolic in the mid pancreas, corresponding to the abnormality on previous MRI.  Features compatible with biopsy-proven pancreatic adenocarcinoma.  16 x 11 mm focus of amorphous soft tissue in the gastrohepatic ligament was hypermetabolic and concerning for metastatic disease.  No evidence of hypermetabolic liver metastasis.  No evidence of metastatic disease elsewhere.  Her case was discussed in GI tumor conference on 03/19/2024. Uncertainty remains regarding the second spot in the gastrohepatic ligament, which may be post-biopsy inflammation rather than cancer.   Patient had consultation with Dr. Aron on 03/20/2024.  Surgery was not recommended at this time due to potential complications and her preference against it.   Clinical stage I versus stage II.  Plan for neoadjuvant chemotherapy with gemcitabine and Abraxane, dose reduced for her age and comorbidities, followed by surgical evaluation.  Initially will plan for chemotherapy on days 1, 15  of 28-day cycle and if well-tolerated, we will attempt to do chemo on days 1, 8, 15 of 28-day cycle.  Will start with 25% dose reduction of gemcitabine and Abraxane.  Will arrange for formal chemo education, Port-A-Cath placement and then plan to begin treatment in the next 1 to 2 weeks.  Referral sent for genetic testing.

## 2024-03-21 NOTE — Progress Notes (Signed)
 START ON PATHWAY REGIMEN - Pancreatic Adenocarcinoma     A cycle is every 28 days:     Nab-paclitaxel (protein bound)      Gemcitabine   **Always confirm dose/schedule in your pharmacy ordering system**  Patient Characteristics: Preoperative, M0 (Clinical Staging), Borderline Resectable, PS ? 2, BRCA1/2 and PALB2 Mutation Absent/Unknown Therapeutic Status: Preoperative, M0 (Clinical Staging) AJCC T Category: cT1c AJCC N Category: cN0 AJCC M Category: cM0 AJCC 8 Stage Grouping: IA ECOG Performance Status: 2 BRCA1/2 Mutation Status: Awaiting Test Results PALB2 Mutation Status: Awaiting Test Results Intent of Therapy: Curative Intent, Discussed with Patient

## 2024-03-21 NOTE — Progress Notes (Signed)
 PATIENT NAVIGATOR PROGRESS NOTE  Name: Tangelia Sanson Date: 03/21/2024 MRN: [id number]  DOB: 06/17/1946   Reason for visit:  Follow-up Appt  Comments:   Patient seen during her follow-up appt with Dr. Autumn. Patient and daughter were given written information on Gemcitabine and Abraxane. Nutrition referral was entered.   Will follow-up to ensure port placement and chemo education is scheduled.   Time spent counseling/coordinating care: 45-60 minutes

## 2024-03-22 ENCOUNTER — Other Ambulatory Visit: Payer: Self-pay

## 2024-03-22 LAB — CANCER ANTIGEN 19-9: CA 19-9: 7 U/mL (ref 0–35)

## 2024-03-27 ENCOUNTER — Other Ambulatory Visit: Payer: Self-pay | Admitting: Gastroenterology

## 2024-03-27 DIAGNOSIS — K219 Gastro-esophageal reflux disease without esophagitis: Secondary | ICD-10-CM

## 2024-03-28 ENCOUNTER — Encounter: Payer: Self-pay | Admitting: Family Medicine

## 2024-03-28 ENCOUNTER — Other Ambulatory Visit: Payer: Self-pay | Admitting: Radiology

## 2024-03-28 ENCOUNTER — Telehealth: Payer: Self-pay | Admitting: Oncology

## 2024-03-28 ENCOUNTER — Ambulatory Visit: Admitting: Family Medicine

## 2024-03-28 VITALS — BP 112/80 | HR 63 | Temp 98.2°F | Ht 62.0 in | Wt 211.6 lb

## 2024-03-28 DIAGNOSIS — C259 Malignant neoplasm of pancreas, unspecified: Secondary | ICD-10-CM

## 2024-03-28 DIAGNOSIS — R530 Neoplastic (malignant) related fatigue: Secondary | ICD-10-CM

## 2024-03-28 DIAGNOSIS — G3184 Mild cognitive impairment, so stated: Secondary | ICD-10-CM | POA: Diagnosis not present

## 2024-03-28 DIAGNOSIS — Z86711 Personal history of pulmonary embolism: Secondary | ICD-10-CM | POA: Diagnosis not present

## 2024-03-28 DIAGNOSIS — I10 Essential (primary) hypertension: Secondary | ICD-10-CM

## 2024-03-28 NOTE — Progress Notes (Signed)
 Established Patient Office Visit   Subjective  Patient ID: Gina Key, female    DOB: 11/10/1945  Age: 78 y.o. MRN: [id number]  Chief Complaint  Patient presents with   Medical Management of Chronic Issues    Pt is here with daughter for 66m f/u.   Pt accompanied by her daughter  Pt is a 78 yo female seen for f/u.  Since last OFV pt seen by GI for EUS/bxp of an incidental pancreatic mass seen on CT, found to be pancreatic adenocarcinoma.  Concerns for possible met after a soft tissue mass seen on PET scan 03/11/24 at gastrohepatic ligament.  Area was not present on a subsequent scan and thought to have been post bxp inflammation.  Pt preparing to have port placed next wk and start 3 mo chemo.  PE earlier this yr thought likely to have been caused by the cancer.  Pt has moments when she does not want to talk about her dx.  Notes her faith will see her through this.  Has noticed increased fatigue but no pain.  Appetite is decreased, may eat once/day.    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/2023   Aortic atherosclerosis (HCC) 10/15/2023   Disc disease, degenerative, lumbar or lumbosacral 10/15/2023   Acute pulmonary embolism (HCC) 10/05/2023   Hypernatremia 10/05/2023   Dyspnea on exertion 10/05/2023   Osteopenia 12/14/2022   Essential hypertension 09/28/2021   Gastroesophageal reflux disease 09/28/2021   Seasonal allergies 09/28/2021   History of migraine 09/28/2021   Urinary incontinence 09/28/2021   Hyperlipidemia 09/28/2021   History of seizure 09/28/2021   OSA (obstructive sleep apnea) 09/28/2021   Past Medical History:  Diagnosis Date   Acid reflux    Allergy    Anxiety    DDD (degenerative disc disease), lumbar    Depression    Facet joint disease    GERD (gastroesophageal reflux disease)    High  cholesterol    Hypertension    Low vitamin D  level    OSA (obstructive sleep apnea)    Primary osteoarthritis involving multiple joints    Pulmonary embolism (HCC)    Seizures (HCC)    Sleep apnea    C PAP   Past Surgical History:  Procedure Laterality Date   BREAST BIOPSY Right    benign   CHOLECYSTECTOMY     ESOPHAGOGASTRODUODENOSCOPY N/A 03/03/2024   Procedure: EGD (ESOPHAGOGASTRODUODENOSCOPY);  Surgeon: Wilhelmenia Aloha Raddle., MD;  Location: [facility];  Service: Gastroenterology;  Laterality: N/A;   EUS N/A 03/03/2024   Procedure: ULTRASOUND, UPPER GI TRACT, ENDOSCOPIC;  Surgeon: Wilhelmenia Aloha Raddle., MD;  Location: [facility];  Service: Gastroenterology;  Laterality: N/A;   FINE NEEDLE ASPIRATION  03/03/2024   Procedure: FINE NEEDLE ASPIRATION;  Surgeon: Wilhelmenia, Aloha Raddle., MD;  Location: [facility];  Service: Gastroenterology;;   left and right rotator cuff     Social History   Tobacco Use   Smoking status: Never   Smokeless tobacco: Never  Vaping Use   Vaping status: Never Used  Substance Use Topics   Alcohol use: Not Currently   Drug use: Never   Family History  Problem Relation Age of Onset   Pancreatic cancer Mother    Thyroid  cancer Sister    Colon polyps Daughter    Kidney cancer Daughter    Sleep apnea Daughter  Breast cancer Maternal Aunt 75   Seizures Paternal Grandmother    Cancer Brother        tongue   Sleep apnea Son    Stroke Son    Seizures Son    Sleep apnea Son    Seizures Grandson    Colon cancer Neg Hx    Crohn's disease Neg Hx    Esophageal cancer Neg Hx    Rectal cancer Neg Hx    Stomach cancer Neg Hx    Ulcerative colitis Neg Hx    Dementia Neg Hx    No Known Allergies  ROS Negative unless stated above    Objective:     BP 112/80 (BP Location: Right Arm, Patient Position: Sitting, Cuff Size: Large)   Pulse 63   Temp 98.2 F (36.8 C) (Oral)   Ht 5' 2 (1.575 m)   Wt 211 lb 9.6 oz (96 kg)   SpO2 96%    BMI 38.70 kg/m  BP Readings from Last 3 Encounters:  03/28/24 112/80  03/21/24 124/67  03/14/24 116/63   Wt Readings from Last 3 Encounters:  03/28/24 211 lb 9.6 oz (96 kg)  03/21/24 209 lb (94.8 kg)  03/14/24 209 lb (94.8 kg)      Physical Exam Constitutional:      General: She is not in acute distress.    Appearance: Normal appearance.  HENT:     Head: Normocephalic and atraumatic.     Nose: Nose normal.     Mouth/Throat:     Mouth: Mucous membranes are moist.  Cardiovascular:     Rate and Rhythm: Normal rate and regular rhythm.     Heart sounds: Normal heart sounds. No murmur heard.    No gallop.  Pulmonary:     Effort: Pulmonary effort is normal. No respiratory distress.     Breath sounds: Normal breath sounds. No wheezing, rhonchi or rales.  Musculoskeletal:     Right lower leg: No edema.     Left lower leg: No edema.  Skin:    General: Skin is warm and dry.  Neurological:     Mental Status: She is alert and oriented to person, place, and time.     Comments: Ambulating with a cane.        03/14/2024    3:22 PM 02/20/2024    1:53 PM 02/06/2024   10:27 AM  Depression screen PHQ 2/9  Decreased Interest 0 0 0  Down, Depressed, Hopeless 0 0 0  PHQ - 2 Score 0 0 0  Altered sleeping  0 1  Tired, decreased energy  0 0  Change in appetite  0 0  Feeling bad or failure about yourself   0 0  Trouble concentrating  0 0  Moving slowly or fidgety/restless  0 0  Suicidal thoughts  0 0  PHQ-9 Score  0 1      02/06/2024   10:27 AM 09/27/2023    4:22 PM 05/04/2023    8:16 AM 03/05/2023    3:31 PM  GAD 7 : Generalized Anxiety Score  Nervous, Anxious, on Edge 0 0 0 0  Control/stop worrying 0 0 0 0  Worry too much - different things 0 0 0 0  Trouble relaxing 0 0 0 0  Restless 0 0 0 0  Easily annoyed or irritable 0 0 0 0  Afraid - awful might happen 0 0 0 0  Total GAD 7 Score 0 0 0 0  Anxiety Difficulty  Not  difficult at all Not difficult at all      No results  found for any visits on 03/28/24.    Assessment & Plan:   Pancreatic adenocarcinoma (HCC)  Essential hypertension  History of pulmonary embolus (PE)  Neoplastic malignant related fatigue  Mild cognitive impairment  Pancreatic adenocarcinoma T1c, N0, MX.  Incidental finding on CT for hematuria.  CA 19-9 was 8, CEA negative, and chromogranin A 224.8.   EGD with EUS and bxp on 03/03/24.  No sis path in CBD.   PET scan 03/11/24 with ill defined soft tissue mass in gastrohepatic ligament  noted, but later thought to be post-bxp inflammation.  Continue f/u with Oncology.  Port placement on 03/31/24 and subsequent dose reduced chemo with gemcitabine and Abraxane in 1-2 wks planned.  Fatigue likely form deconditioning and cancer.  PT ordered by Onc.  Continue Eliquis  for h/o PE.  Given coupons and samples of supplemental shakes in clinic.     No follow-ups on file.  F/u in 4-6 months, sooner as needed based on other appts.  I personally spent a total of 41 minutes in the care of the patient today including preparing to see the patient, getting/reviewing separately obtained history, performing a medically appropriate exam/evaluation, counseling and educating, placing orders, documenting clinical information in the EHR, independently interpreting results, communicating results, and coordinating care.  Clotilda JONELLE Single, MD

## 2024-03-28 NOTE — Telephone Encounter (Signed)
 I informed [name] that our next available education appointment is on 9/15 at 9am. [name] stated that she would like to keep Charlesetta's appointment scheduled for 9/12 at 2pm.

## 2024-03-28 NOTE — Telephone Encounter (Signed)
 Gina Key LVM requesting an earlier education appt.

## 2024-03-28 NOTE — Telephone Encounter (Signed)
 I scheduled Gina Key for the next available education appointment. I asked [name] to return my call if she cannot make the appointment and would like to re-schedule.

## 2024-03-31 ENCOUNTER — Other Ambulatory Visit: Payer: Self-pay

## 2024-03-31 ENCOUNTER — Ambulatory Visit (HOSPITAL_COMMUNITY)
Admission: RE | Admit: 2024-03-31 | Discharge: 2024-03-31 | Disposition: A | Discharge status: Home / Self Care | Source: Ambulatory Visit | Attending: Oncology

## 2024-03-31 ENCOUNTER — Ambulatory Visit (HOSPITAL_COMMUNITY)
Admission: RE | Admit: 2024-03-31 | Discharge: 2024-03-31 | Disposition: A | Discharge status: Home / Self Care | Source: Ambulatory Visit | Attending: Oncology | Admitting: Oncology

## 2024-03-31 ENCOUNTER — Encounter (HOSPITAL_COMMUNITY): Payer: Self-pay

## 2024-03-31 DIAGNOSIS — I10 Essential (primary) hypertension: Secondary | ICD-10-CM | POA: Diagnosis not present

## 2024-03-31 DIAGNOSIS — K219 Gastro-esophageal reflux disease without esophagitis: Secondary | ICD-10-CM | POA: Insufficient documentation

## 2024-03-31 DIAGNOSIS — R569 Unspecified convulsions: Secondary | ICD-10-CM | POA: Insufficient documentation

## 2024-03-31 DIAGNOSIS — G4733 Obstructive sleep apnea (adult) (pediatric): Secondary | ICD-10-CM | POA: Diagnosis not present

## 2024-03-31 DIAGNOSIS — E785 Hyperlipidemia, unspecified: Secondary | ICD-10-CM | POA: Diagnosis not present

## 2024-03-31 DIAGNOSIS — Z86711 Personal history of pulmonary embolism: Secondary | ICD-10-CM | POA: Insufficient documentation

## 2024-03-31 DIAGNOSIS — F419 Anxiety disorder, unspecified: Secondary | ICD-10-CM | POA: Insufficient documentation

## 2024-03-31 DIAGNOSIS — C259 Malignant neoplasm of pancreas, unspecified: Secondary | ICD-10-CM | POA: Insufficient documentation

## 2024-03-31 HISTORY — PX: IR IMAGING GUIDED PORT INSERTION: IMG5740

## 2024-03-31 MED ORDER — LIDOCAINE HCL 1 % IJ SOLN
10.0000 mL | Freq: Once | INTRAMUSCULAR | Status: AC
  Administered 2024-03-31: 10 mL via INTRADERMAL

## 2024-03-31 MED ORDER — LIDOCAINE HCL 1 % IJ SOLN
INTRAMUSCULAR | Status: AC
  Filled 2024-03-31: qty 20

## 2024-03-31 MED ORDER — FENTANYL CITRATE (PF) 100 MCG/2ML IJ SOLN
INTRAMUSCULAR | Status: AC | PRN
  Administered 2024-03-31: 50 ug via INTRAVENOUS

## 2024-03-31 MED ORDER — HEPARIN SOD (PORK) LOCK FLUSH 100 UNIT/ML IV SOLN
500.0000 [IU] | Freq: Once | INTRAVENOUS | Status: AC
  Administered 2024-03-31: 500 [IU]

## 2024-03-31 MED ORDER — LIDOCAINE-EPINEPHRINE 1 %-1:100000 IJ SOLN
20.0000 mL | Freq: Once | INTRAMUSCULAR | Status: AC
  Administered 2024-03-31: 20 mL via INTRADERMAL

## 2024-03-31 MED ORDER — FENTANYL CITRATE (PF) 100 MCG/2ML IJ SOLN
INTRAMUSCULAR | Status: AC
  Filled 2024-03-31: qty 2

## 2024-03-31 MED ORDER — MIDAZOLAM HCL 2 MG/2ML IJ SOLN
INTRAMUSCULAR | Status: AC
  Filled 2024-03-31: qty 2

## 2024-03-31 MED ORDER — MIDAZOLAM HCL 2 MG/2ML IJ SOLN
INTRAMUSCULAR | Status: AC | PRN
  Administered 2024-03-31: 1 mg via INTRAVENOUS

## 2024-03-31 MED ORDER — LIDOCAINE-EPINEPHRINE 1 %-1:100000 IJ SOLN
INTRAMUSCULAR | Status: AC
  Filled 2024-03-31: qty 1

## 2024-03-31 MED ORDER — HEPARIN SOD (PORK) LOCK FLUSH 100 UNIT/ML IV SOLN
INTRAVENOUS | Status: AC
  Filled 2024-03-31: qty 5

## 2024-03-31 MED ORDER — SODIUM CHLORIDE 0.9 % IV SOLN: INTRAVENOUS | Status: DC

## 2024-03-31 NOTE — Sedation Documentation (Signed)
 RN Christoffer Currier pulled 2 mg Versed  and 100 mcg Fentanyl  in IR room. Pt. Received 2 mg Versed  and 100 mcg Fentanyl  throughout the procedure.

## 2024-03-31 NOTE — Discharge Instructions (Signed)
 Please call Interventional Radiology clinic [phone] with any questions or concerns.  You may remove your dressing and shower tomorrow.  After the procedure, it is common to have: Discomfort at the port insertion site. Bruising on the skin over the port. This should improve over 3-4 days  Follow these instructions at home:  Medication: Do not use Aspirin or ibuprofen products, such as Advil or Motrin, as it may increase bleeding.  You may resume your usual medications as ordered by your doctor. If your doctor prescribed antibiotics, take them as directed. Do not stop taking them just because you feel better. You need to take the full course of antibiotics.  Eating and drinking: Drink plenty of liquids to keep your urine pale yellow You can resume your regular diet as directed by your doctor   Care of the procedure site Follow instructions from your health care provider about how to take care of your port insertion site. Make sure you: After your port is placed, you will get a manufacturer's information card. The card has information about your port. Keep this card with you at all times Make sure to remember what type of port you have Take care of the port as told by your health care provider DO NOT use EMLA cream for 2 weeks after port placement -the cream will remove the surgical glue on your incision DO NOT use any lotions, creams, or ointments on incision for 2 weeks. This will remove the surgical glue on your incision Wash your hands with soap and water before and after you change your bandage (dressing). If soap and water are not available, use hand sanitizer Change your dressing as told by your health care provider Leave skin glue, or adhesive strips in place. These skin closures may need to stay in place for 2 weeks or longer Check your port insertion site every day for signs of infection. Check for: Redness, swelling, or pain Fluid or blood Warmth Pus or a bad  smell  Activity Return to your normal activities as told by your health care provider. Ask your health care provider what activities are safe for you Do not lift anything that is heavier than 10 lb (4.5 kg), or the limit that you are told, until your health care provider says that it is safe Do not take baths, swim, or use a hot tub until your health care provider approves. Take showers only. Keep all follow-up visits as told by your doctor  Contact a health care provider if: You cannot flush your port with saline as directed, or you cannot draw blood from the port You have a fever or chills You have redness, swelling, or pain around your port insertion site You have fluid or blood coming from your port insertion site Your port insertion site feels warm to the touch You have pus or a bad smell coming from the port insertion site  Get help right away if: You have chest pain or shortness of breath You have bleeding from your port that you cannot control  Moderate Conscious Sedation-Care After  This sheet gives you information about how to care for yourself after your procedure. Your health care provider may also give you more specific instructions. If you have problems or questions, contact your health care provider.  After the procedure, it is common to have: Sleepiness for several hours. Impaired judgment for several hours. Difficulty with balance. Vomiting if you eat too soon.  Follow these instructions at home:  Rest. Do not  participate in activities where you could fall or become injured. Do not drive or use machinery. Do not drink alcohol. Do not take sleeping pills or medicines that cause drowsiness. Do not make important decisions or sign legal documents. Do not take care of children on your own.  Eating and drinking Follow the diet recommended by your health care provider. Drink enough fluid to keep your urine pale yellow. If you vomit: Drink water, juice, or soup  when you can drink without vomiting. Make sure you have little or no nausea before eating solid foods.  General instructions Take over-the-counter and prescription medicines only as told by your health care provider. Have a responsible adult stay with you for the time you are told. It is important to have someone help care for you until you are awake and alert. Do not smoke. Keep all follow-up visits as told by your health care provider. This is important.  Contact a health care provider if: You are still sleepy or having trouble with balance after 24 hours. You feel light-headed. You keep feeling nauseous or you keep vomiting. You develop a rash. You have a fever. You have redness or swelling around the IV site.  Get help right away if: You have trouble breathing. You have new-onset confusion at home.  This information is not intended to replace advice given to you by your health care provider. Make sure you discuss any questions you have with your healthcare provider.

## 2024-03-31 NOTE — Procedures (Signed)
 Interventional Radiology Procedure:   Indications: Pancreatic cancer  Procedure: Port placement  Findings: Right jugular port, tip at SVC/RA junction  Complications: None     EBL: Minimal, less than 10 ml  Plan: Discharge in one hour.  Keep port site and incisions dry for at least 24 hours.     Jak Haggar R. Philip, MD  Pager: [phone]

## 2024-03-31 NOTE — H&P (Signed)
 Chief Complaint: Patient was seen in consultation today for pancreatic adenocarcinoma.  Referring Physician(s): Pasam,Avinash  Supervising Physician: Philip Cornet  Patient Status: [facility] - Out-pt  History of Present Illness: Gina Key is a 78 y.o. female with a past medical history of significant for anxiety, GERD, HTN, HLD, OSA, PE, seizures, pancreatic adenocarcinoma who presents today for port placement. Ms. Mungo presented to the ED on 02/05/24 with complaints of gross hematuria and she underwent a CT abd/pelvis w/contrast which showed:  1. No acute intra-abdominal or pelvic abnormality. 2. Small nonobstructive calculus in the lower pole of the left kidney. No hydronephrosis. 3. Moderate dilation of the pancreatic duct in the midbody and tail. Overlying parenchymal atrophy is also present. This may be due to chronic pancreatitis or an obstructive neoplasm. A multiphase abdominal MRI with IV contrast is recommended for further characterization. No superimposed findings to suggest acute pancreatitis.  She was discharged with outpatient follow up and underwent MRI abdomen w/ and w/o contrast which showed:  As seen on the prior CT there is a focal segment of pancreatic ductal dilatation with abrupt caliber change and stricture. Subtle mass lesion this location identified measuring 17 x 15 mm at the level of the caliber change and has appearance worrisome for neoplasm such as an adenocarcinoma. Recommend further evaluation. Lesion does abut the course of the splenic vein with some narrowing just proximal to the portal venous confluence. Few collateral vessels are identified.   No other areas of malignant disease suggested. No abnormal lymph nodes. No liver mass.   Benign hepatic and renal cystic foci.  Previous cholecystectomy.  She was referred to oncology and plans to begin chemotherapy. IR has been consulted for port placement.   Past Medical History:  Diagnosis  Date   Acid reflux    Allergy    Anxiety    DDD (degenerative disc disease), lumbar    Depression    Facet joint disease    GERD (gastroesophageal reflux disease)    High cholesterol    Hypertension    Low vitamin D  level    OSA (obstructive sleep apnea)    Primary osteoarthritis involving multiple joints    Pulmonary embolism (HCC)    Seizures (HCC)    Sleep apnea    C PAP    Past Surgical History:  Procedure Laterality Date   ABDOMINAL HYSTERECTOMY     BREAST BIOPSY Right    benign   CHOLECYSTECTOMY     ESOPHAGOGASTRODUODENOSCOPY N/A 03/03/2024   Procedure: EGD (ESOPHAGOGASTRODUODENOSCOPY);  Surgeon: Wilhelmenia Aloha Raddle., MD;  Location: [facility];  Service: Gastroenterology;  Laterality: N/A;   EUS N/A 03/03/2024   Procedure: ULTRASOUND, UPPER GI TRACT, ENDOSCOPIC;  Surgeon: Wilhelmenia Aloha Raddle., MD;  Location: [facility];  Service: Gastroenterology;  Laterality: N/A;   FINE NEEDLE ASPIRATION  03/03/2024   Procedure: FINE NEEDLE ASPIRATION;  Surgeon: Wilhelmenia, Aloha Raddle., MD;  Location: [facility];  Service: Gastroenterology;;   left and right rotator cuff      Allergies: Patient has no known allergies.  Medications: Prior to Admission medications   Medication Sig Start Date End Date Taking? Authorizing Provider  apixaban  (ELIQUIS ) 5 MG TABS tablet Take 1 tablet (5 mg total) by mouth 2 (two) times daily. 10/10/23   [name], MD  ferrous sulfate 325 (65 FE) MG tablet Take 325 mg by mouth at bedtime.    [provider]  Fexofenadine HCl (ALLEGRA PO) Take by mouth.    [provider]  Incontinence Supply Disposable (WINGS HL ADULT BRIEFS/XL) MISC As needed daily. 09/27/23   [name], MD  ipratropium (ATROVENT ) 0.06 % nasal spray PLACE 2 SPRAYS INTO BOTH NOSTRILS 4 TIMES DAILY. 02/26/24   [name], MD  levETIRAcetam  (KEPPRA ) 500 MG tablet Take 1 tablet (500 mg total) by mouth 2 (two) times daily. 12/04/23   Gayland Lauraine PARAS,  NP  lidocaine  (HM LIDOCAINE  PATCH) 4 % Place 1 patch onto the skin daily as needed.    [provider]  lidocaine -prilocaine  (EMLA ) cream Apply to affected area once 03/21/24   Pasam, Avinash, MD  losartan  (COZAAR ) 100 MG tablet TAKE 1 TABLET BY MOUTH EVERY DAY 11/30/23   [name], MD  metoprolol  succinate (TOPROL -XL) 25 MG 24 hr tablet TAKE 1 TABLET (25 MG TOTAL) BY MOUTH DAILY. 03/04/24   [name], MD  montelukast  (SINGULAIR ) 10 MG tablet TAKE 1 TABLET BY MOUTH EVERYDAY AT BEDTIME 03/04/24   [name], MD  omeprazole (PRILOSEC) 20 MG capsule TAKE 1 CAPSULE EVERY DAY 03/27/24   Mansouraty, Gabriel Jr., MD  ondansetron  (ZOFRAN ) 8 MG tablet Take 1 tablet (8 mg total) by mouth every 8 (eight) hours as needed for nausea or vomiting. 03/21/24   Pasam, Avinash, MD  Oxycodone  HCl 10 MG TABS Take 1 tablet (10 mg total) by mouth every 12 (twelve) hours as needed. 01/18/24   Urbano Albright, MD  potassium chloride  (KLOR-CON ) 10 MEQ tablet TAKE 1 TABLET BY MOUTH EVERY DAY 10/31/23   [name], MD  prochlorperazine  (COMPAZINE ) 10 MG tablet Take 1 tablet (10 mg total) by mouth every 6 (six) hours as needed for nausea or vomiting. 03/21/24   Pasam, Avinash, MD  rosuvastatin  (CRESTOR ) 20 MG tablet Take 1 tablet (20 mg total) by mouth daily. 12/07/23   [name], MD  spironolactone  (ALDACTONE ) 25 MG tablet TAKE 1 TABLET (25 MG TOTAL) BY MOUTH DAILY. 03/04/24   [name], MD  topiramate  (TOPAMAX ) 100 MG tablet TAKE 1 TABLET BY MOUTH TWICE A DAY 11/30/23   [name], MD     Family History  Problem Relation Age of Onset   Pancreatic cancer Mother    Thyroid  cancer Sister    Colon polyps Daughter    Kidney cancer Daughter    Sleep apnea Daughter    Breast cancer Maternal Aunt 40   Seizures Paternal Grandmother    Cancer Brother        tongue   Sleep apnea Son    Stroke Son    Seizures Son    Sleep apnea Son    Seizures Grandson    Colon cancer Neg Hx     Crohn's disease Neg Hx    Esophageal cancer Neg Hx    Rectal cancer Neg Hx    Stomach cancer Neg Hx    Ulcerative colitis Neg Hx    Dementia Neg Hx     Social History   Socioeconomic History   Marital status: Widowed    Spouse name: Not on file   Number of children: 6   Years of education: 66   Highest education level: Not on file  Occupational History   Not on file  Tobacco Use   Smoking status: Never    Passive exposure: Never   Smokeless tobacco: Never  Vaping Use   Vaping status: Never Used  Substance and Sexual Activity   Alcohol use: Not Currently   Drug use: Never   Sexual  activity: Not on file  Other Topics Concern   Not on file  Social History Narrative   Lives at home with daughter   Right handed   Caffeine: none    Social Drivers of Health   Financial Resource Strain: Low Risk  (02/20/2024)   Overall Financial Resource Strain (CARDIA)    Difficulty of Paying Living Expenses: Not hard at all  Food Insecurity: No Food Insecurity (03/17/2024)   Hunger Vital Sign    Worried About Running Out of Food in the Last Year: Never true    Ran Out of Food in the Last Year: Never true  Transportation Needs: No Transportation Needs (03/17/2024)   PRAPARE - Administrator, Civil Service (Medical): No    Lack of Transportation (Non-Medical): No  Physical Activity: Inactive (02/20/2024)   Exercise Vital Sign    Days of Exercise per Week: 0 days    Minutes of Exercise per Session: 0 min  Stress: No Stress Concern Present (02/20/2024)   [employer] of Occupational Health - Occupational Stress Questionnaire    Feeling of Stress: Not at all  Social Connections: Moderately Integrated (02/20/2024)   Social Connection and Isolation Panel    Frequency of Communication with Friends and Family: More than three times a week    Frequency of Social Gatherings with Friends and Family: More than three times a week    Attends Religious Services: More than 4 times per  year    Active Member of [employer] or Organizations: Yes    Attends Banker Meetings: More than 4 times per year    Marital Status: Widowed     Review of Systems: A 12 point ROS discussed and pertinent positives are indicated in the HPI above.  All other systems are negative.    Vital Signs: BP (!) 140/77   Pulse 67   Temp 98.1 F (36.7 C) (Oral)   Resp 18   Ht 5' 2 (1.575 m)   Wt 211 lb 9.6 oz (96 kg)   SpO2 100%   BMI 38.70 kg/m   Physical Exam Vitals and nursing note reviewed.  Constitutional:      Appearance: Normal appearance.  HENT:     Mouth/Throat:     Mouth: Mucous membranes are moist.     Pharynx: Oropharynx is clear.  Cardiovascular:     Rate and Rhythm: Normal rate and regular rhythm.  Pulmonary:     Effort: Pulmonary effort is normal.     Breath sounds: Normal breath sounds.  Abdominal:     Palpations: Abdomen is soft.     Tenderness: There is no abdominal tenderness.  Musculoskeletal:     Right lower leg: No edema.     Left lower leg: No edema.  Skin:    General: Skin is warm and dry.  Neurological:     Mental Status: She is alert and oriented to person, place, and time. Mental status is at baseline.          Imaging: NM PET Image Initial (PI) Skull Base To Thigh Result Date: 03/11/2024 CLINICAL DATA:  Initial treatment strategy for pancreatic adenocarcinoma. EXAM: NUCLEAR MEDICINE PET SKULL BASE TO THIGH TECHNIQUE: 10.2 mCi F-18 FDG was injected intravenously. Full-ring PET imaging was performed from the skull base to thigh after the radiotracer. CT data was obtained and used for attenuation correction and anatomic localization. Fasting blood glucose: 104 mg/dl COMPARISON:  MRI abdomen 02/07/2024. CT abdomen and pelvis 02/05/2024. FINDINGS: Mediastinal blood pool  activity: SUV max 3.9 Liver activity: SUV max NA NECK: No hypermetabolic lymph nodes in the neck. Incidental CT findings: None. CHEST: No hypermetabolic mediastinal or hilar  nodes. No suspicious pulmonary nodules on the CT scan. Incidental CT findings: Mild atherosclerotic calcification is noted in the wall of the thoracic aorta. Coronary artery calcification is evident. No pericardial effusion. No pleural effusion. ABDOMEN/PELVIS: Ill-defined heterogeneous low level hypermetabolism identified in the mid pancreas, corresponding to the abnormality on previous MRI. SUV max = 7.4. 16 x 11 mm focus of amorphous soft tissue in the gastrohepatic ligament (image 95/4) is hypermetabolic with SUV max = 7.4. No hypermetabolic lymphadenopathy in the abdomen. No hypermetabolic liver lesion evident. No suspicious focal hypermetabolism elsewhere in the abdomen or pelvis. Incidental CT findings: Cholecystectomy. There is mild atherosclerotic calcification of the abdominal aorta without aneurysm. SKELETON: No focal hypermetabolic activity to suggest skeletal metastasis. Incidental CT findings: No worrisome lytic or sclerotic osseous abnormality. IMPRESSION: 1. Ill-defined heterogeneous low level hypermetabolism in the mid pancreas, corresponding to the abnormality on previous MRI. Imaging features compatible with biopsy-proven pancreatic adenocarcinoma. 2. 16 x 11 mm focus of amorphous soft tissue in the gastrohepatic ligament is hypermetabolic and compatible with metastatic disease. 3. No evidence for hypermetabolic liver metastases. No evidence for hypermetabolic metastatic disease in the neck, chest, or pelvis. 4.  Aortic Atherosclerosis (ICD10-I70.0). Electronically Signed   By: Camellia Candle M.D.   On: 03/11/2024 08:46    Labs:  CBC: Recent Labs    02/05/24 1153 02/12/24 1355 02/17/24 0615 03/21/24 0850  WBC 8.3 8.6 8.0 7.9  HGB 11.4* 11.8* 10.6* 11.7*  HCT 39.3 38.9 35.2* 38.5  PLT 186 186 188 208    COAGS: Recent Labs    10/05/23 0422 02/05/24 1153 02/17/24 0615  INR 1.1 1.1 1.1  APTT 142*  --   --     BMP: Recent Labs    02/05/24 1153 02/12/24 1355 02/17/24 0615  03/21/24 0850  NA 141 144 145 141  K 3.9 4.2 4.1 3.9  CL 112* 114* 114* 111  CO2 22 26 23 23   GLUCOSE 80 84 104* 132*  BUN 12 11 14 14   CALCIUM  9.3 9.6 9.3 9.5  CREATININE 0.82 0.81 0.93 0.86  GFRNONAA >60 >60 >60 >60    LIVER FUNCTION TESTS: Recent Labs    02/05/24 1153 02/12/24 1355 02/17/24 0615 03/21/24 0850  BILITOT 0.5 0.4 0.2 0.5  AST 19 20 20 22   ALT 18 20 17 24   ALKPHOS 100 107 117 107  PROT 6.9 7.2 6.4* 7.4  ALBUMIN 3.8 4.2 4.1 4.1    TUMOR MARKERS: Recent Labs    02/12/24 1355  CEA <1.00    Assessment and Plan:  78 y/o F with recently diagnosed pancreatic cancer who presents today for port placement.  Risks and benefits of image-guided Port-a-catheter placement were discussed with the patient including, but not limited to bleeding, infection, pneumothorax, or fibrin sheath development and need for additional procedures.  All of the patient's questions were answered, patient is agreeable to proceed.  Consent signed and in chart.  Thank you for this interesting consult.  I greatly enjoyed meeting Gina Key and look forward to participating in their care.  A copy of this report was sent to the requesting provider on this date.  Electronically Signed: Kimble VEAR Clas, PA-C 03/31/2024, 10:42 AM   I spent a total of 30 Minutes  in face to face in clinical consultation, greater than 50% of which  was counseling/coordinating care for pancreatic adenocarcinoma.

## 2024-04-01 ENCOUNTER — Encounter: Payer: Self-pay | Admitting: Licensed Clinical Social Worker

## 2024-04-01 ENCOUNTER — Ambulatory Visit: Payer: Self-pay | Admitting: Licensed Clinical Social Worker

## 2024-04-01 DIAGNOSIS — Z1379 Encounter for other screening for genetic and chromosomal anomalies: Secondary | ICD-10-CM | POA: Insufficient documentation

## 2024-04-01 NOTE — Progress Notes (Signed)
 error

## 2024-04-02 ENCOUNTER — Other Ambulatory Visit: Payer: Self-pay | Admitting: Oncology

## 2024-04-02 DIAGNOSIS — C259 Malignant neoplasm of pancreas, unspecified: Secondary | ICD-10-CM

## 2024-04-02 NOTE — Progress Notes (Signed)
 Pharmacist Chemotherapy Monitoring - Initial Assessment    Anticipated start date: 04/11/24   The following has been reviewed per standard work regarding the patient's treatment regimen: The patient's diagnosis, treatment plan and drug doses, and organ/hematologic function Lab orders and baseline tests specific to treatment regimen  The treatment plan start date, drug sequencing, and pre-medications Prior authorization status  Patient's documented medication list, including drug-drug interaction screen and prescriptions for anti-emetics and supportive care specific to the treatment regimen The drug concentrations, fluid compatibility, administration routes, and timing of the medications to be used The patient's access for treatment and lifetime cumulative dose history, if applicable  The patient's medication allergies and previous infusion related reactions, if applicable   Changes made to treatment plan:  N/A  Follow up needed:  N/A   Ziggy Reveles, PharmD, MBA

## 2024-04-04 ENCOUNTER — Inpatient Hospital Stay: Attending: Physician Assistant

## 2024-04-04 ENCOUNTER — Other Ambulatory Visit: Payer: Self-pay | Admitting: Oncology

## 2024-04-04 DIAGNOSIS — C259 Malignant neoplasm of pancreas, unspecified: Secondary | ICD-10-CM | POA: Insufficient documentation

## 2024-04-04 DIAGNOSIS — Z5111 Encounter for antineoplastic chemotherapy: Secondary | ICD-10-CM | POA: Insufficient documentation

## 2024-04-04 MED ORDER — COVID-19 MRNA VAC-TRIS(PFIZER) 30 MCG/0.3ML IM SUSY: 0.3000 mL | PREFILLED_SYRINGE | Freq: Once | INTRAMUSCULAR | 0 refills | Status: AC

## 2024-04-09 ENCOUNTER — Other Ambulatory Visit: Payer: Self-pay | Admitting: Family Medicine

## 2024-04-09 DIAGNOSIS — I2699 Other pulmonary embolism without acute cor pulmonale: Secondary | ICD-10-CM

## 2024-04-09 DIAGNOSIS — I82432 Acute embolism and thrombosis of left popliteal vein: Secondary | ICD-10-CM

## 2024-04-11 ENCOUNTER — Inpatient Hospital Stay: Admitting: Dietician

## 2024-04-11 ENCOUNTER — Encounter: Payer: Self-pay | Admitting: Oncology

## 2024-04-11 ENCOUNTER — Inpatient Hospital Stay: Admitting: Oncology

## 2024-04-11 ENCOUNTER — Inpatient Hospital Stay

## 2024-04-11 VITALS — BP 136/80 | HR 70 | Temp 97.9°F | Resp 17 | Ht 62.0 in | Wt 213.0 lb

## 2024-04-11 DIAGNOSIS — C259 Malignant neoplasm of pancreas, unspecified: Secondary | ICD-10-CM | POA: Diagnosis not present

## 2024-04-11 DIAGNOSIS — Z5111 Encounter for antineoplastic chemotherapy: Secondary | ICD-10-CM | POA: Diagnosis present

## 2024-04-11 LAB — CBC WITH DIFFERENTIAL (CANCER CENTER ONLY)
Abs Immature Granulocytes: 0.02 K/uL (ref 0.00–0.07)
Basophils Absolute: 0 K/uL (ref 0.0–0.1)
Basophils Relative: 0 %
Eosinophils Absolute: 0.1 K/uL (ref 0.0–0.5)
Eosinophils Relative: 1 %
HCT: 36.4 % (ref 36.0–46.0)
Hemoglobin: 11.2 g/dL — ABNORMAL LOW (ref 12.0–15.0)
Immature Granulocytes: 0 %
Lymphocytes Relative: 30 %
Lymphs Abs: 2.4 K/uL (ref 0.7–4.0)
MCH: 26.3 pg (ref 26.0–34.0)
MCHC: 30.8 g/dL (ref 30.0–36.0)
MCV: 85.4 fL (ref 80.0–100.0)
Monocytes Absolute: 0.5 K/uL (ref 0.1–1.0)
Monocytes Relative: 7 %
Neutro Abs: 4.9 K/uL (ref 1.7–7.7)
Neutrophils Relative %: 62 %
Platelet Count: 159 K/uL (ref 150–400)
RBC: 4.26 MIL/uL (ref 3.87–5.11)
RDW: 13.2 % (ref 11.5–15.5)
WBC Count: 8 K/uL (ref 4.0–10.5)
nRBC: 0 % (ref 0.0–0.2)

## 2024-04-11 LAB — CMP (CANCER CENTER ONLY)
ALT: 15 U/L (ref 0–44)
AST: 17 U/L (ref 15–41)
Albumin: 4.3 g/dL (ref 3.5–5.0)
Alkaline Phosphatase: 106 U/L (ref 38–126)
Anion gap: 3 — ABNORMAL LOW (ref 5–15)
BUN: 16 mg/dL (ref 8–23)
CO2: 26 mmol/L (ref 22–32)
Calcium: 9.6 mg/dL (ref 8.9–10.3)
Chloride: 114 mmol/L — ABNORMAL HIGH (ref 98–111)
Creatinine: 0.79 mg/dL (ref 0.44–1.00)
GFR, Estimated: >60 mL/min (ref >60)
Glucose, Bld: 87 mg/dL (ref 70–99)
Potassium: 4 mmol/L (ref 3.5–5.1)
Sodium: 143 mmol/L (ref 135–145)
Total Bilirubin: 0.4 mg/dL (ref 0.0–1.2)
Total Protein: 7.3 g/dL (ref 6.5–8.1)

## 2024-04-11 MED ORDER — SODIUM CHLORIDE 0.9 % IV SOLN: INTRAVENOUS | Status: DC

## 2024-04-11 MED ORDER — SODIUM CHLORIDE 0.9 % IV SOLN
800.0000 mg/m2 | Freq: Once | INTRAVENOUS | Status: AC
  Administered 2024-04-11: 1634 mg via INTRAVENOUS
  Filled 2024-04-11: qty 42.97

## 2024-04-11 MED ORDER — PACLITAXEL PROTEIN-BOUND CHEMO INJECTION 100 MG
100.0000 mg/m2 | Freq: Once | INTRAVENOUS | Status: AC
  Administered 2024-04-11: 200 mg via INTRAVENOUS
  Filled 2024-04-11: qty 40

## 2024-04-11 MED ORDER — PROCHLORPERAZINE MALEATE 10 MG PO TABS
10.0000 mg | ORAL_TABLET | Freq: Once | ORAL | Status: AC
  Administered 2024-04-11: 10 mg via ORAL
  Filled 2024-04-11: qty 1

## 2024-04-11 NOTE — Progress Notes (Signed)
 [facility]  ONCOLOGY CLINIC PROGRESS NOTE   Patient Care Team: [name], MD as PCP - General (Family Medicine) Ardis, Evalene CROME, RN as Oncology Nurse Navigator Mansouraty, Aloha Raddle., MD as Consulting Physician (Gastroenterology)  PATIENT NAME: Gina Key   MR#: [id number] DOB: [birth year]  Date of visit: 04/11/2024   ASSESSMENT & PLAN:   Gina Key is a 78 y.o.  lady with a past medical history of hypertension, history of pulmonary embolism in March 2025, seizure disorder, obstructive sleep apnea, arthritis, GERD, was referred to our clinic in August 2025 for pancreatic adenocarcinoma.   Pancreatic adenocarcinoma [facility]) She was seen in our rapid diagnostic clinic on 02/12/2024 for consultation and was referred to GI for EUS/biopsy.  Labs on that day showed a CA 19-9 of 8, CEA undetectable, chromogranin A increased at 224.8 ng/mL.  CBCD and CMP were unremarkable with normal LFTs.  On 03/03/2024 Dr. Wilhelmenia performed EGD and EUS.  On EUS, mass was identified in the pancreatic body.  Endosonographic appearance was highly suspicious for adenocarcinoma.  Staged as T1c, N0, MX.  FNA performed.  There was no sign of significant pathology in the CBD and in the common hepatic duct.  No malignant appearing lymph nodes were visualized in the celiac region, peripancreatic region and porta hepatis.  A cyst was found in the visualized portion of the liver measured 26 mm x 23 mm.  No evidence of any solid mass lesions in the left lobe of liver.  FNA from pancreatic mass came back positive for adenocarcinoma.  On 03/11/2024, staging PET scan showed ill-defined heterogeneous low-level hypermetabolic in the mid pancreas, corresponding to the abnormality on previous MRI.  Features compatible with biopsy-proven pancreatic adenocarcinoma.  16 x 11 mm focus of amorphous soft tissue in the gastrohepatic ligament was hypermetabolic and concerning for metastatic disease.   No evidence of hypermetabolic liver metastasis.  No evidence of metastatic disease elsewhere.  Her case was discussed in GI tumor conference on 03/19/2024. Uncertainty remains regarding the second spot in the gastrohepatic ligament, which may be post-biopsy inflammation rather than cancer.   Genetic testing came back negative for any deleterious germline mutations.  Patient had consultation with Dr. 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  The location of this does appear to be relatively central.  It is unclear to me whether this is proximal enough in the body that would require a Whipple or a distal pancreas.  It is not well-seen on imaging.  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 We will start with neoadjuvant chemotherapy.  I will then get repeat imaging.  I do want a pancreatic protocol follow-up scan.   Plan for neoadjuvant chemotherapy with gemcitabine  and Abraxane , dose reduced for her age and comorbidities, followed by surgical evaluation.  Initially will plan for chemotherapy on days 1, 15 of 28-day cycle and if well-tolerated, we will attempt to do chemo on days 1, 8, 15 of 28-day cycle.  Will start with 20% dose reduction of gemcitabine  and Abraxane .   Labs reveal no dose-limiting toxicities today.  Will proceed with cycle 1 day 1 of gemcitabine  and Abraxane  with dose reductions as mentioned above.  RTC in 2 weeks for cycle 1 day 15 of chemotherapy.  Fatigue and decreased functional status secondary to cancer Fatigue and decreased functional status are likely secondary to cancer and recent venous thromboembolism. She can  perform some daily activities but experiences significant fatigue after exertion. - Refer to physical therapy to improve strength and functional status.  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 Blood counts are  well-managed, with hemoglobin improved to 11.7 g/dL. - Continue Eliquis  for anticoagulation. - Monitor blood counts regularly during chemotherapy.  Suspected post-biopsy inflammation in gastrohepatic ligament Suspected post-biopsy inflammation in the gastrohepatic ligament, with uncertainty about whether it represents cancer. PET scan showed activity, but MRI and endoscopic ultrasound did not confirm cancer. Radiologist suggests it may be inflammation rather than cancer. - Monitor for signs of pancreatitis with additional labs, including lipase levels.  I reviewed lab results and outside records for this visit and discussed relevant results with the patient. Diagnosis, plan of care and treatment options were also discussed in detail with the patient. Opportunity provided to ask questions and answers provided to her apparent satisfaction. Provided instructions to call our clinic with any problems, questions or concerns prior to return visit. I recommended to continue follow-up with PCP and sub-specialists. She verbalized understanding and agreed with the plan.   NCCN guidelines have been consulted in the planning of this patient's care.  I spent a total of 30 minutes during this encounter with the patient including review of chart and various tests results, discussions about plan of care and coordination of care plan.   [name], MD  04/11/2024 3:13 PM  [facility] - A DEPT OF [facility] [address] [place] [address] Dept: [phone] Dept Fax: [phone]    CHIEF COMPLAINT/ REASON FOR VISIT:   Adenocarcinoma of pancreas, Stage I vs stage II  Current Treatment: Plan for neoadjuvant chemotherapy with dose reduce gemcitabine  and Abraxane , followed by surgical evaluation.  INTERVAL HISTORY:    Discussed the use of AI scribe software for clinical note transcription with the patient, who gave verbal consent to  proceed.  History of Present Illness  Gina Key is a 78 year old female who presents for chemotherapy treatment.  She has no new or concerning symptoms since her last visit. She maintains a good appetite and is able to eat well.  She has been prescribed nausea medications to manage potential side effects of chemotherapy. She has Zofran  (ondansetron ) to be taken three times a day as needed, and Compazine  (prochlorperazine ) to be taken every six hours, alternating with Zofran . She is concerned about nausea.  A recent genetic test returned negative for mutations that could indicate a hereditary cancer syndrome. She inquires about the potential causes of her cancer, including past medication use for acid reflux, but no definitive link is established.   I have reviewed the past medical history, past surgical history, social history and family history with the patient and they are unchanged from previous note.  HISTORY OF PRESENT ILLNESS:   ONCOLOGY HISTORY:   On review of the previous records patient was evaluated in the emergency department on 02/05/2024 for gross hematuria.  CT abdomen pelvis was performed which showed moderate dilation of the pancreatic duct in the mid body and tail overlying and parenchymal atrophy. MR abdomen was ordered by PCP Dr. Mercer and performed on 02/07/24. MRI showed a focal segment of pancreatic ductal dilatation with abrupt caliber change and stricture. Subtle mass lesion in this location identified measuring 17 x 15 mm at the level of the caliber change and has appearance worrisome for neoplasm such as an adenocarcinoma.  Image did not show any other areas concerning for malignant disease.    CMP collected 02/05/24 in the ED was overall unremarkable. Further chart review shows patient was diagnosed with a submassive PE with right heart strain 10/05/23 and is currently taking Eliquis .     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 She does not smoke  or drink alcohol. She currently lives with her daughter due to past seizures. Her last colonoscopy in February 2024 was normal, as was her last mammogram. Her family history is significant for her mother having pancreatic cancer at age 55, her brother having throat cancer, and her daughter having kidney cancer.   She was seen in our rapid diagnostic clinic on 02/12/2024 for consultation and was referred to GI for EUS/biopsy.  Labs on that day showed a CA 19-9 of 8, CEA undetectable, chromogranin A increased at 224.8 ng/mL.  CBCD and CMP were unremarkable with normal LFTs.   On 03/03/2024 Dr. Wilhelmenia performed EGD and EUS.  On EUS, mass was identified in the pancreatic body.  Endosonographic appearance was highly suspicious for adenocarcinoma.  Staged as T1c, N0, MX.  FNA performed.  There was no sign of significant pathology in the CBD and in the common hepatic duct.  No malignant appearing lymph nodes were visualized in the celiac region, peripancreatic region and porta hepatis.  A cyst was found in the visualized portion of the liver measured 26 mm x 23 mm.  No evidence of any solid mass lesions in the left lobe of liver.   FNA from pancreatic mass came back positive for adenocarcinoma.   On 03/11/2024, staging PET scan showed ill-defined heterogeneous low-level hypermetabolic in the mid pancreas, corresponding to the abnormality on previous MRI.  Features compatible with biopsy-proven pancreatic adenocarcinoma.  16 x 11 mm focus of amorphous soft tissue in the gastrohepatic ligament was hypermetabolic and concerning for metastatic disease.  No evidence of hypermetabolic liver metastasis.  No evidence of metastatic disease elsewhere.   Her case was discussed in GI tumor conference on 03/19/2024. Uncertainty remains regarding the second spot in the gastrohepatic ligament, which may be post-biopsy inflammation rather than cancer.   Genetic testing came back negative for any deleterious germline  mutations.  Patient had consultation with Dr. 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  The location of this does appear to be relatively central.  It is unclear to me whether this is proximal enough in the body that would require a Whipple or a distal pancreas.  It is not well-seen on imaging.  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 We will start with neoadjuvant chemotherapy.  I will then get repeat imaging.  I do want a pancreatic protocol follow-up scan.   Plan for neoadjuvant chemotherapy with gemcitabine  and Abraxane , dose reduced for her age and comorbidities, followed by surgical evaluation.  Started systemic treatment with gemcitabine  and Abraxane  from 04/11/2024.  Oncology History  Pancreatic adenocarcinoma (HCC)  03/03/2024 Initial Diagnosis   Pancreatic adenocarcinoma (HCC)   03/21/2024 Cancer Staging   Staging form: Exocrine Pancreas, AJCC 8th Edition - Clinical: Stage IIB (cT1c, cN1, cM0) - Signed by Autumn Millman, MD on 04/11/2024   03/31/2024 Genetic Testing   Negative genetic testing. No pathogenic variants identified on the [facility] CancerNext-Expanded+RNA Panel. The report date is 03/31/2024.   The CancerNext-Expanded gene panel offered by [employer] [facility] and includes sequencing, rearrangement, and RNA analysis for  the following 77 genes: AIP, ALK, APC, ATM, AXIN2, BAP1, BARD1, BMPR1A, BRCA1, BRCA2, BRIP1, CDC73, CDH1, CDK4, CDKN1B, CDKN2A, CEBPA, CHEK2, CTNNA1, DDX41, DICER1, ETV6, FH, FLCN, GATA2, LZTR1, MAX, MBD4, MEN1, MET, MLH1, MSH2, MSH3, MSH6, MUTYH, NF1, NF2, NTHL1, PALB2, PHOX2B, PMS2, POT1, PRKAR1A, PTCH1, PTEN, RAD51C, RAD51D, RB1, RET, RPS20, RUNX1, SDHA, SDHAF2, SDHB, SDHC, SDHD, SMAD4, SMARCA4, SMARCB1, SMARCE1, STK11, SUFU, TMEM127, TP53, TSC1, TSC2, VHL, and WT1 (sequencing and deletion/duplication); EGFR, HOXB13, KIT, MITF, PDGFRA, POLD1, and POLE (sequencing only); EPCAM and GREM1  (deletion/duplication only).    04/11/2024 -  Chemotherapy   Patient is on Treatment Plan : PANCREATIC Abraxane  D1,15 + Gemcitabine  D1,15 q28d         REVIEW OF SYSTEMS:   Review of Systems - Oncology  All other pertinent systems were reviewed with the patient and are negative.  ALLERGIES: She has no known allergies.  MEDICATIONS:  Current Outpatient Medications  Medication Sig Dispense Refill   ELIQUIS  5 MG TABS tablet TAKE 1 TABLET BY MOUTH TWICE A DAY 180 tablet 1   ferrous sulfate 325 (65 FE) MG tablet Take 325 mg by mouth at bedtime.     Fexofenadine HCl (ALLEGRA PO) Take by mouth.     Incontinence Supply Disposable (WINGS HL ADULT BRIEFS/XL) MISC As needed daily. 100 each 11   ipratropium (ATROVENT ) 0.06 % nasal spray PLACE 2 SPRAYS INTO BOTH NOSTRILS 4 TIMES DAILY. 45 mL 1   levETIRAcetam  (KEPPRA ) 500 MG tablet Take 1 tablet (500 mg total) by mouth 2 (two) times daily. 180 tablet 3   lidocaine  (HM LIDOCAINE  PATCH) 4 % Place 1 patch onto the skin daily as needed.     lidocaine -prilocaine  (EMLA ) cream Apply to affected area once 30 g 3   losartan  (COZAAR ) 100 MG tablet TAKE 1 TABLET BY MOUTH EVERY DAY 90 tablet 1   metoprolol  succinate (TOPROL -XL) 25 MG 24 hr tablet TAKE 1 TABLET (25 MG TOTAL) BY MOUTH DAILY. 90 tablet 1   montelukast  (SINGULAIR ) 10 MG tablet TAKE 1 TABLET BY MOUTH EVERYDAY AT BEDTIME 90 tablet 1   omeprazole (PRILOSEC) 20 MG capsule TAKE 1 CAPSULE EVERY DAY 90 capsule 1   ondansetron  (ZOFRAN ) 8 MG tablet Take 1 tablet (8 mg total) by mouth every 8 (eight) hours as needed for nausea or vomiting. 30 tablet 1   Oxycodone  HCl 10 MG TABS Take 1 tablet (10 mg total) by mouth every 12 (twelve) hours as needed. 30 tablet 0   potassium chloride  (KLOR-CON ) 10 MEQ tablet TAKE 1 TABLET BY MOUTH EVERY DAY 90 tablet 0   prochlorperazine  (COMPAZINE ) 10 MG tablet Take 1 tablet (10 mg total) by mouth every 6 (six) hours as needed for nausea or vomiting. 30 tablet 1    rosuvastatin  (CRESTOR ) 20 MG tablet Take 1 tablet (20 mg total) by mouth daily. 90 tablet 3   spironolactone  (ALDACTONE ) 25 MG tablet TAKE 1 TABLET (25 MG TOTAL) BY MOUTH DAILY. 90 tablet 1   topiramate  (TOPAMAX ) 100 MG tablet TAKE 1 TABLET BY MOUTH TWICE A DAY 180 tablet 1   Current Facility-Administered Medications  Medication Dose Route Frequency Provider Last Rate Last Admin   lidocaine  (XYLOCAINE ) 1 % (with pres) injection 1 mL  1 mL Other Once        lidocaine  (XYLOCAINE ) 1 % (with pres) injection 4 mL  4 mL Other Once        triamcinolone  acetonide (KENALOG -40) injection 40 mg  40 mg Intramuscular Once  triamcinolone  acetonide (KENALOG -40) injection 40 mg  40 mg Intra-articular Once        Facility-Administered Medications Ordered in Other Visits  Medication Dose Route Frequency Provider Last Rate Last Admin   0.9 %  sodium chloride  infusion   Intravenous Continuous Saffron Busey, MD 10 mL/hr at 04/11/24 1248 New Bag at 04/11/24 1248   gemcitabine  (GEMZAR ) 1,634 mg in sodium chloride  0.9 % 250 mL chemo infusion  800 mg/m2 (Treatment Plan Recorded) Intravenous Once Radin Raptis, MD 586 mL/hr at 04/11/24 1455 1,634 mg at 04/11/24 1455     VITALS:   Blood pressure 136/80, pulse 70, temperature 97.9 F (36.6 C), resp. rate 17, height 5' 2 (1.575 m), weight 213 lb (96.6 kg), SpO2 100%.  Wt Readings from Last 3 Encounters:  04/11/24 213 lb (96.6 kg)  03/31/24 211 lb 9.6 oz (96 kg)  03/28/24 211 lb 9.6 oz (96 kg)    Body mass index is 38.96 kg/m.    Onc Performance Status - 04/11/24 1222       ECOG Perf Status   ECOG Perf Status Capable of only limited selfcare, confined to bed or chair more than 50% of waking hours      KPS SCALE   KPS % SCORE Cares for self, unable to carry on normal activity or to do active work           PHYSICAL EXAM:   Physical Exam Constitutional:      General: She is not in acute distress.    Appearance: Normal appearance.  HENT:      Head: Normocephalic and atraumatic.  Cardiovascular:     Rate and Rhythm: Normal rate.  Pulmonary:     Effort: Pulmonary effort is normal. No respiratory distress.  Abdominal:     General: There is no distension.  Neurological:     General: No focal deficit present.     Mental Status: She is alert and oriented to person, place, and time.  Psychiatric:        Mood and Affect: Mood normal.        Behavior: Behavior normal.       LABORATORY DATA:   I have reviewed the data as listed.  Results for orders placed or performed in visit on 04/11/24  CMP (Cancer Center only)  Result Value Ref Range   Sodium 143 135 - 145 mmol/L   Potassium 4.0 3.5 - 5.1 mmol/L   Chloride 114 (H) 98 - 111 mmol/L   CO2 26 22 - 32 mmol/L   Glucose, Bld 87 70 - 99 mg/dL   BUN 16 8 - 23 mg/dL   Creatinine 9.20 9.55 - 1.00 mg/dL   Calcium  9.6 8.9 - 10.3 mg/dL   Total Protein 7.3 6.5 - 8.1 g/dL   Albumin 4.3 3.5 - 5.0 g/dL   AST 17 15 - 41 U/L   ALT 15 0 - 44 U/L   Alkaline Phosphatase 106 38 - 126 U/L   Total Bilirubin 0.4 0.0 - 1.2 mg/dL   GFR, Estimated >39 >39 mL/min   Anion gap 3 (L) 5 - 15  CBC with Differential (Cancer Center Only)  Result Value Ref Range   WBC Count 8.0 4.0 - 10.5 K/uL   RBC 4.26 3.87 - 5.11 MIL/uL   Hemoglobin 11.2 (L) 12.0 - 15.0 g/dL   HCT 63.5 63.9 - 53.9 %   MCV 85.4 80.0 - 100.0 fL   MCH 26.3 26.0 - 34.0 pg   MCHC 30.8  30.0 - 36.0 g/dL   RDW 86.7 88.4 - 84.4 %   Platelet Count 159 150 - 400 K/uL   nRBC 0.0 0.0 - 0.2 %   Neutrophils Relative % 62 %   Neutro Abs 4.9 1.7 - 7.7 K/uL   Lymphocytes Relative 30 %   Lymphs Abs 2.4 0.7 - 4.0 K/uL   Monocytes Relative 7 %   Monocytes Absolute 0.5 0.1 - 1.0 K/uL   Eosinophils Relative 1 %   Eosinophils Absolute 0.1 0.0 - 0.5 K/uL   Basophils Relative 0 %   Basophils Absolute 0.0 0.0 - 0.1 K/uL   Immature Granulocytes 0 %   Abs Immature Granulocytes 0.02 0.00 - 0.07 K/uL      RADIOGRAPHIC STUDIES:  I have  personally reviewed the radiological images as listed and agree with the findings in the report.  IR IMAGING GUIDED PORT INSERTION Result Date: 03/31/2024 INDICATION: Pancreatic adenocarcinoma.  Port-A-Cath needed for treatment. EXAM: FLUOROSCOPIC AND ULTRASOUND GUIDED PLACEMENT OF A SUBCUTANEOUS PORT MEDICATIONS: Moderate sedation ANESTHESIA/SEDATION: Moderate (conscious) sedation was employed during this procedure. A total of Versed  2 mg and fentanyl  100 mcg was administered intravenously at the order of the provider performing the procedure. Total intra-service moderate sedation time: 23 minutes. Patient's level of consciousness and vital signs were monitored continuously by radiology nurse throughout the procedure under the supervision of the provider performing the procedure. FLUOROSCOPY TIME:  Radiation Exposure Index (as provided by the fluoroscopic device): 11 mGy Kerma COMPLICATIONS: None immediate. PROCEDURE: The procedure, risks, benefits, and alternatives were explained to the patient. Questions regarding the procedure were encouraged and answered. The patient understands and consents to the procedure. Patient was placed supine on the interventional table. Ultrasound confirmed a patent right internal jugular vein. Ultrasound image was saved for documentation. The right chest and neck were cleaned with a skin antiseptic and a sterile drape was placed. Maximal barrier sterile technique was utilized including caps, mask, sterile gowns, sterile gloves, sterile drape, hand hygiene and skin antiseptic. The right neck was anesthetized with 1% lidocaine . Small incision was made in the right neck with a blade. Micropuncture set was placed in the right internal jugular vein with ultrasound guidance. The micropuncture wire was used for measurement purposes. The right chest was anesthetized with 1% lidocaine  with epinephrine . #15 blade was used to make an incision and a subcutaneous port pocket was formed. 8  french Power Port was assembled. Subcutaneous tunnel was formed with a stiff tunneling device. The port catheter was brought through the subcutaneous tunnel. The port was placed in the subcutaneous pocket. The micropuncture set was exchanged for a peel-away sheath. The catheter was placed through the peel-away sheath and the tip was positioned at the superior cavoatrial junction. Catheter placement was confirmed with fluoroscopy. The port was accessed and flushed with heparinized saline. The port pocket was closed using two layers of absorbable sutures and Dermabond. The vein skin site was closed using a single layer of absorbable suture and Dermabond. Sterile dressings were applied. Patient tolerated the procedure well without an immediate complication. Ultrasound and fluoroscopic images were taken and saved for this procedure. IMPRESSION: Placement of a subcutaneous power-injectable port device. Catheter tip at the superior cavoatrial junction. Electronically Signed   By: Juliene Balder M.D.   On: 03/31/2024 15:05      CODE STATUS:  Code Status History     Date Active Date Inactive Code Status Order ID Comments User Context   10/05/2023 0213 10/06/2023 1942 Full Code  [id number]  Sundil, Subrina, MD ED    Questions for Most Recent Historical Code Status (Order [id number])     Question Answer   By: Consent: discussion documented in EHR            No orders of the defined types were placed in this encounter.    Future Appointments  Date Time Provider Department Center  04/17/2024  5:00 PM Zenaida Reyes CHRISTELLA ALMETA [facility]  04/21/2024 10:40 AM Urbano Albright, MD [facility] CPR  04/25/2024  8:45 AM [facility] FLUSH [facility] None  04/25/2024  9:15 AM Franchot Pollitt, Chinita, MD [facility] None  04/25/2024 10:00 AM [facility] INFUSION [facility] None  05/09/2024  8:45 AM [facility] FLUSH [facility] None  05/09/2024  9:15 AM Teryn Gust, MD [facility] None  05/09/2024 10:00 AM [facility]  INFUSION [facility] None  06/11/2024  7:45 AM Buck Saucer, MD [facility] None  02/25/2025  1:40 PM [facility]-ANNUAL WELLNESS VISIT [facility] [address]      This document was completed utilizing speech recognition software. Grammatical errors, random word insertions, pronoun errors, and incomplete sentences are an occasional consequence of this system due to software limitations, ambient noise, and hardware issues. Any formal questions or concerns about the content, text or information contained within the body of this dictation should be directly addressed to the provider for clarification.

## 2024-04-11 NOTE — Progress Notes (Signed)
 [facility] CSW Progress Note  Visual merchandiser met with patient / daughter in the infusion suite to assess needs as psychosocial needs as he continues through treatment. Patient getting first treatment and reported no complaints with appointment so far. CSW discussed with patient available supports through the cancer center.Patient / Daughter discussed close friends who have breast cancer and have been assisting with treatment support. CSW encouraged continue conversations with support systems, and encouraged her to utilize GI support group and cancer center support groups in addition to friends. Patient agreeable to clinical intern touching base via telephone or in person as she continues through treatment. Patient's daughter has card for financial assistance questions if needed.     Lizbeth Sprague, LCSW Clinical Social Worker [facility]

## 2024-04-11 NOTE — Patient Instructions (Signed)
 [facility] - A DEPT OF [facility]  Discharge Instructions: Thank you for choosing [facility] to provide your oncology and hematology care.   If you have a lab appointment with the Cancer Center, please go directly to the Cancer Center and check in at the registration area.   Wear comfortable clothing and clothing appropriate for easy access to any Portacath or PICC line.   We strive to give you quality time with your provider. You may need to reschedule your appointment if you arrive late (15 or more minutes).  Arriving late affects you and other patients whose appointments are after yours.  Also, if you miss three or more appointments without notifying the office, you may be dismissed from the clinic at the provider's discretion.      For prescription refill requests, have your pharmacy contact our office and allow 72 hours for refills to be completed.    Today you received the following chemotherapy and/or immunotherapy agents: Abraxane, Gemzar      To help prevent nausea and vomiting after your treatment, we encourage you to take your nausea medication as directed.  BELOW ARE SYMPTOMS THAT SHOULD BE REPORTED IMMEDIATELY: *FEVER GREATER THAN 100.4 F (38 C) OR HIGHER *CHILLS OR SWEATING *NAUSEA AND VOMITING THAT IS NOT CONTROLLED WITH YOUR NAUSEA MEDICATION *UNUSUAL SHORTNESS OF BREATH *UNUSUAL BRUISING OR BLEEDING *URINARY PROBLEMS (pain or burning when urinating, or frequent urination) *BOWEL PROBLEMS (unusual diarrhea, constipation, pain near the anus) TENDERNESS IN MOUTH AND THROAT WITH OR WITHOUT PRESENCE OF ULCERS (sore throat, sores in mouth, or a toothache) UNUSUAL RASH, SWELLING OR PAIN  UNUSUAL VAGINAL DISCHARGE OR ITCHING   Items with * indicate a potential emergency and should be followed up as soon as possible or go to the Emergency Department if any problems should occur.  Please show the CHEMOTHERAPY ALERT CARD or  IMMUNOTHERAPY ALERT CARD at check-in to the Emergency Department and triage nurse.  Should you have questions after your visit or need to cancel or reschedule your appointment, please contact [facility] - A DEPT OF [facility]  Dept: [phone]  and follow the prompts.  Office hours are 8:00 a.m. to 4:30 p.m. Monday - Friday. Please note that voicemails left after 4:00 p.m. may not be returned until the following business day.  We are closed weekends and major holidays. You have access to a nurse at all times for urgent questions. Please call the main number to the clinic Dept: [phone] and follow the prompts.   For any non-urgent questions, you may also contact your provider using MyChart. We now offer e-Visits for anyone 73 and older to request care online for non-urgent symptoms. For details visit [email or web address].   Also download the MyChart app! Go to the app store, search "MyChart", open the app, select [facility], and log in with your MyChart username and password.

## 2024-04-11 NOTE — Progress Notes (Signed)
 Nutrition Assessment   Reason for Assessment: Referral   ASSESSMENT: 78 year old female with newly diagnosed stage II pancreatic cancer. She is starting neoadjuvant gemcitabine /abraxane  (first 9/19). Patient is under the care of Dr. Autumn  Past medical history includes HTN, acute PE, OSA, GERD, erosive gastritis, osteopenia, HLD, DOE, mild cognitive impairment  Met with patient and daughter in infusion. Patient doing well today. Says she is eating more than usual as daughter is cooking and providing meals. Recalls a huge breakfast that makes her really full (oatmeal, yogurt, banana, juice). Yesterday had [place] sandwich with mayo, chips, and water for lunch. Ate chick fila sandwich with mayo for dinner. Patient is drinking 4-5 bottles of water. Denies nausea, vomiting, constipation. Has chronic diarrhea s/p cholecystectomy which had recently increased. Patient thankful this is currently resolved. Patient started drinking premier protein as a case was given to her.   Nutrition Focused Physical Exam: no depletions appreciated    Medications: eliquis , ferrous sulfate, keppra , cozaar , toprol , prilosec, zofran , klor-con , compazine , crestor , topamax    Labs: reviewed    Anthropometrics:   Height: 5'2 Weight: 213 lb  UBW: 205-211 lb (last 12 months) BMI: 38.96   NUTRITION DIAGNOSIS: Food and nutrition related knowledge deficit related to cancer as evidenced by no prior need for associated nutrition information   INTERVENTION:  Educated on importance of adequate calorie/protein energy intake to maintain strength/weights during treatment  Discussed sources of protein, recommend protein food at every meal Suggested 4-6 small meals/snacks vs 3 larger meals - handout with snack ideas provided Discussed use of ONS as needed - pt is currently eating well and has gained wt. Do not feel ONS is necessary at this time Contact information provided    MONITORING, EVALUATION, GOAL: Patient will  tolerate adequate calories and protein to preserve LBM   Next Visit: Friday October 3 during infusion

## 2024-04-11 NOTE — Assessment & Plan Note (Signed)
 She was seen in our rapid diagnostic clinic on 02/12/2024 for consultation and was referred to GI for EUS/biopsy.  Labs on that day showed a CA 19-9 of 8, CEA undetectable, chromogranin A increased at 224.8 ng/mL.  CBCD and CMP were unremarkable with normal LFTs.  On 03/03/2024 Dr. Wilhelmenia performed EGD and EUS.  On EUS, mass was identified in the pancreatic body.  Endosonographic appearance was highly suspicious for adenocarcinoma.  Staged as T1c, N0, MX.  FNA performed.  There was no sign of significant pathology in the CBD and in the common hepatic duct.  No malignant appearing lymph nodes were visualized in the celiac region, peripancreatic region and porta hepatis.  A cyst was found in the visualized portion of the liver measured 26 mm x 23 mm.  No evidence of any solid mass lesions in the left lobe of liver.  FNA from pancreatic mass came back positive for adenocarcinoma.  On 03/11/2024, staging PET scan showed ill-defined heterogeneous low-level hypermetabolic in the mid pancreas, corresponding to the abnormality on previous MRI.  Features compatible with biopsy-proven pancreatic adenocarcinoma.  16 x 11 mm focus of amorphous soft tissue in the gastrohepatic ligament was hypermetabolic and concerning for metastatic disease.  No evidence of hypermetabolic liver metastasis.  No evidence of metastatic disease elsewhere.  Her case was discussed in GI tumor conference on 03/19/2024. Uncertainty remains regarding the second spot in the gastrohepatic ligament, which may be post-biopsy inflammation rather than cancer.   Genetic testing came back negative for any deleterious germline mutations.  Patient had consultation with Dr. 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  The location of this does appear to be relatively central.  It is unclear to me whether this is proximal enough in the body that would require a Whipple or a distal pancreas.  It is not well-seen on imaging.  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 We will start with neoadjuvant chemotherapy.  I will then get repeat imaging.  I do want a pancreatic protocol follow-up scan.   Plan for neoadjuvant chemotherapy with gemcitabine  and Abraxane , dose reduced for her age and comorbidities, followed by surgical evaluation.  Initially will plan for chemotherapy on days 1, 15 of 28-day cycle and if well-tolerated, we will attempt to do chemo on days 1, 8, 15 of 28-day cycle.  Will start with 20% dose reduction of gemcitabine  and Abraxane .   Labs reveal no dose-limiting toxicities today.  Will proceed with cycle 1 day 1 of gemcitabine  and Abraxane  with dose reductions as mentioned above.  RTC in 2 weeks for cycle 1 day 15 of chemotherapy.

## 2024-04-14 ENCOUNTER — Other Ambulatory Visit: Payer: Self-pay | Admitting: Oncology

## 2024-04-14 ENCOUNTER — Encounter: Payer: Self-pay | Admitting: Oncology

## 2024-04-14 ENCOUNTER — Encounter: Payer: Self-pay | Admitting: General Practice

## 2024-04-14 DIAGNOSIS — K8681 Exocrine pancreatic insufficiency: Secondary | ICD-10-CM

## 2024-04-14 MED ORDER — PANCRELIPASE (LIP-PROT-AMYL) 36000-114000 UNITS PO CPEP: ORAL_CAPSULE | ORAL | 11 refills | Status: AC

## 2024-04-14 NOTE — Progress Notes (Signed)
 PATIENT NAVIGATOR PROGRESS NOTE  Name: Gina Key Date: 04/14/2024 MRN: [id number]  DOB: August 16, 1945  Patient is established with a treatment plan and is actively engaged in care. Nurse Navigator services not currently indicated at this time. Will re-evaluate if needs change or if additional support is requested.

## 2024-04-14 NOTE — Progress Notes (Signed)
 [facility] Spiritual Care Note  Spoke with Gina Key daughter [name] by phone. She reports that her mom is doing well overall. No concerns at this time, but family is aware of ongoing Spiritual Care availability as needed/desired.  [address] [place], [place], [facility] Pager [phone] Voicemail [phone]

## 2024-04-15 ENCOUNTER — Telehealth: Payer: Self-pay

## 2024-04-15 NOTE — Telephone Encounter (Addendum)
 Returned patient's daughter's message with concerns that her mother had abdominal pain and bloating following her first chemo treatment last Friday.   As I spoke with [name], the patient was having a diarrheal episode. Patient was asked by [name] if her abdominal pain was gone along with the bloating and patient reported that it was all gone. The diarrhea episode was isolated.  [name] shared that Dr. Wilhelmenia had prescribed Phazyme for gas and it was reinforced that patient should take this if she is feeling bloated.  [name] felt satisfied with this conversation and agreed we would keep an eye on the patient as she experienced chemotherapy in the further.  Dr. Autumn was made aware.

## 2024-04-16 NOTE — Therapy (Unsigned)
 OUTPATIENT PHYSICAL THERAPY LOWER EXTREMITY EVALUATION   Patient Name: Gina Key MRN: [id number] DOB:04-Feb-1946, 78 y.o., female Today's Date: 04/17/2024  END OF SESSION:  PT End of Session - 04/17/24 1705     Visit Number 1    Number of Visits 8    Date for Recertification  06/17/24    Authorization Type [employer]    PT Start Time 1700    PT Stop Time 1745    PT Time Calculation (min) 45 min    Activity Tolerance Patient limited by fatigue;Patient tolerated treatment well    Behavior During Therapy Anxious          Past Medical History:  Diagnosis Date   Acid reflux    Allergy    Anxiety    DDD (degenerative disc disease), lumbar    Depression    Facet joint disease    GERD (gastroesophageal reflux disease)    High cholesterol    Hypertension    Low vitamin D  level    OSA (obstructive sleep apnea)    Primary osteoarthritis involving multiple joints    Pulmonary embolism (HCC)    Seizures (HCC)    Sleep apnea    C PAP   Past Surgical History:  Procedure Laterality Date   ABDOMINAL HYSTERECTOMY     BREAST BIOPSY Right    benign   CHOLECYSTECTOMY     ESOPHAGOGASTRODUODENOSCOPY N/A 03/03/2024   Procedure: EGD (ESOPHAGOGASTRODUODENOSCOPY);  Surgeon: Wilhelmenia Aloha Raddle., MD;  Location: [facility];  Service: Gastroenterology;  Laterality: N/A;   EUS N/A 03/03/2024   Procedure: ULTRASOUND, UPPER GI TRACT, ENDOSCOPIC;  Surgeon: Wilhelmenia Aloha Raddle., MD;  Location: [facility];  Service: Gastroenterology;  Laterality: N/A;   FINE NEEDLE ASPIRATION  03/03/2024   Procedure: FINE NEEDLE ASPIRATION;  Surgeon: Wilhelmenia Aloha Raddle., MD;  Location: [facility];  Service: Gastroenterology;;   IR IMAGING GUIDED PORT INSERTION  03/31/2024   left and right rotator cuff     Patient Active Problem List   Diagnosis Date Noted   Genetic testing 04/01/2024   Erosive gastritis 03/03/2024   Pancreatic adenocarcinoma (HCC) 03/03/2024   Mild cognitive impairment  12/04/2023   Simple hepatic cyst 10/15/2023   Aortic atherosclerosis 10/15/2023   Disc disease, degenerative, lumbar or lumbosacral 10/15/2023   Acute pulmonary embolism (HCC) 10/05/2023   Hypernatremia 10/05/2023   Dyspnea on exertion 10/05/2023   Osteopenia 12/14/2022   Essential hypertension 09/28/2021   Gastroesophageal reflux disease 09/28/2021   Seasonal allergies 09/28/2021   History of migraine 09/28/2021   Urinary incontinence 09/28/2021   Hyperlipidemia 09/28/2021   History of seizure 09/28/2021   OSA (obstructive sleep apnea) 09/28/2021    PCP: [name], MD   REFERRING PROVIDER: Aron Shoulders, MD   REFERRING DIAG: DFM related to pancreatic CA  THERAPY DIAG:  Physical deconditioning  Unsteadiness on feet  Rationale for Evaluation and Treatment: Rehabilitation  ONSET DATE: chronic  SUBJECTIVE:   SUBJECTIVE STATEMENT: Patient referred to OPPT for prehab prior to undergoing chemotherapy.  PERTINENT HISTORY: None noted PAIN:  Are you having pain? No  PRECAUTIONS: active CA(pancreatic)  RED FLAGS: None   WEIGHT BEARING RESTRICTIONS: No  FALLS:  Has patient fallen in last 6 months? No    OCCUPATION: retired  PLOF: Independent  PATIENT GOALS: To improve my mobility  NEXT MD VISIT: TBD  OBJECTIVE:  Note: Objective measures were completed at Evaluation unless otherwise noted.  DIAGNOSTIC FINDINGS: none applicable to PT  PATIENT SURVEYS:  PSFS: THE PATIENT  SPECIFIC FUNCTIONAL SCALE  Place score of 0-10 (0 = unable to perform activity and 10 = able to perform activity at the same level as before injury or problem)  Activity Date: 04/17/24    Standing from a chair  9    2.  Standing 5 min 5    3.  Walking 5 min 5    4.      Total Score 19/30      Total Score = Sum of activity scores/number of activities  Minimally Detectable Change: 3 points (for single activity); 2 points (for average score)  [facility] (nd). The  Patient Specific Functional Scale . Retrieved from [email or web address]     MUSCLE LENGTH: N/T  PALPATION: Not tested  LOWER EXTREMITY ROM:  Active ROM Right eval Left eval  Hip flexion    Hip extension    Hip abduction    Hip adduction    Hip internal rotation    Hip external rotation    Knee flexion    Knee extension    Ankle dorsiflexion    Ankle plantarflexion    Ankle inversion    Ankle eversion     (Blank rows = not tested)  LOWER EXTREMITY MMT:  MMT Right eval Left eval  Hip flexion    Hip extension    Hip abduction    Hip adduction    Hip internal rotation    Hip external rotation    Knee flexion    Knee extension    Ankle dorsiflexion    Ankle plantarflexion    Ankle inversion    Ankle eversion     (Blank rows = not tested)   FUNCTIONAL TESTS:  30 seconds chair stand test 6 reps  2 MWT 183ft with cane   GAIT: Distance walked: 62ft x2 Assistive device utilized: Single point cane Level of assistance: Modified independence Comments: slow cadence                                                                                                                                TREATMENT: [facility] Adult PT Treatment:                                                DATE: 04/17/24 Eval and HEP Self Care: Additional minutes spent for educating on updated Therapeutic Home Exercise Program as well as comparing current status to condition at start of symptoms. This included exercises focusing on stretching, strengthening, with focus on eccentric aspects. Long term goals include an improvement in range of motion, strength, endurance as well as avoiding reinjury. Patient's frequency would include in 1-2 times a day, 3-5 times a week for a duration of 6-12 weeks. Proper technique shown and discussed handout in great detail. All questions were discussed and addressed.  PATIENT EDUCATION:  Education details:  Discussed eval findings, rehab rationale and POC and patient is in agreement  Person educated: Patient Education method: Explanation and Handouts Education comprehension: verbalized understanding and needs further education  HOME EXERCISE PROGRAM: Access Code: [email or web address] URL: [email or web address] Date: 04/17/2024 Prepared by: Reyes Kohut  Exercises - Sit to Stand with Arms Crossed  - 2 x daily - 5 x weekly - 1 sets - 5 reps - Heel Toe Raises with Counter Support  - 2 x daily - 5 x weekly - 1 sets - 10 reps  ASSESSMENT:  CLINICAL IMPRESSION: Patient is a 78 y.o. female who was seen today for physical therapy evaluation and treatment for physical deconditioning due to ongoing CA care.  30s chair stand test shows LE strength deficits and 2 MWT shows endurance and mobility deficits.  Recommend OPPT 1w8 to accomdate chemo schedule with goal of improving strength, mobility and endurance as tolerated.  OBJECTIVE IMPAIRMENTS: Abnormal gait, cardiopulmonary status limiting activity, decreased activity tolerance, decreased endurance, decreased mobility, difficulty walking, decreased strength, and obesity.   ACTIVITY LIMITATIONS: carrying, lifting, standing, and stairs  PERSONAL FACTORS: Age, Fitness, and 1 comorbidity: active CA are also affecting patient's functional outcome.   REHAB POTENTIAL: Good  CLINICAL DECISION MAKING: Evolving/moderate complexity  EVALUATION COMPLEXITY: Low   GOALS: Goals reviewed with patient? No  SHORT TERM GOALS: Target date: 05/15/2024   Patient to demonstrate independence in HEP  Baseline: [email or web address] Goal status: INITIAL   LONG TERM GOALS: Target date: 06/12/2024    Assess 2 MWT for gains in distance Baseline: 153ft Goal status: INITIAL  2.  Patient will increase 30s chair stand reps from 6 to 8 without arms to demonstrate and improved functional ability with less pain/difficulty as well as reduce fall risk.  Baseline: 6 Goal  status: INITIAL  3.  Patient will score at least 25/30 on PSFS to signify clinically meaningful improvement in functional abilities.   Baseline: 19/30 Goal status: INITIAL     PLAN:  PT FREQUENCY: 1x/week  PT DURATION: 8 weeks  PLANNED INTERVENTIONS: 97110-Therapeutic exercises, 97530- Therapeutic activity, 97112- Neuromuscular re-education, 97535- Self Care, [id number]- Manual therapy, [phone]- Gait training, Patient/Family education, Balance training, and Stair training  PLAN FOR NEXT SESSION: HEP review and update, manual techniques as appropriate, aerobic tasks, ROM and flexibility activities, strengthening and PREs, TPDN, gait and balance training as needed     Reyes CHRISTELLA Kohut, PT 04/17/2024, 6:04 PM

## 2024-04-17 ENCOUNTER — Ambulatory Visit: Attending: Family Medicine

## 2024-04-17 ENCOUNTER — Other Ambulatory Visit: Payer: Self-pay

## 2024-04-17 ENCOUNTER — Other Ambulatory Visit: Payer: Self-pay | Admitting: Family Medicine

## 2024-04-17 DIAGNOSIS — R5381 Other malaise: Secondary | ICD-10-CM | POA: Insufficient documentation

## 2024-04-17 DIAGNOSIS — R2681 Unsteadiness on feet: Secondary | ICD-10-CM | POA: Insufficient documentation

## 2024-04-21 ENCOUNTER — Encounter: Payer: Self-pay | Admitting: Physical Medicine & Rehabilitation

## 2024-04-21 ENCOUNTER — Encounter: Attending: Physical Medicine & Rehabilitation | Admitting: Physical Medicine & Rehabilitation

## 2024-04-21 VITALS — BP 138/79 | HR 76 | Ht 62.0 in | Wt 212.0 lb

## 2024-04-21 DIAGNOSIS — Z5181 Encounter for therapeutic drug level monitoring: Secondary | ICD-10-CM | POA: Diagnosis not present

## 2024-04-21 DIAGNOSIS — Z79899 Other long term (current) drug therapy: Secondary | ICD-10-CM | POA: Insufficient documentation

## 2024-04-21 DIAGNOSIS — M170 Bilateral primary osteoarthritis of knee: Secondary | ICD-10-CM | POA: Diagnosis not present

## 2024-04-21 DIAGNOSIS — M19011 Primary osteoarthritis, right shoulder: Secondary | ICD-10-CM | POA: Diagnosis not present

## 2024-04-21 DIAGNOSIS — G8929 Other chronic pain: Secondary | ICD-10-CM | POA: Diagnosis not present

## 2024-04-21 DIAGNOSIS — M25551 Pain in right hip: Secondary | ICD-10-CM | POA: Insufficient documentation

## 2024-04-21 DIAGNOSIS — G894 Chronic pain syndrome: Secondary | ICD-10-CM | POA: Diagnosis not present

## 2024-04-21 MED ORDER — OXYCODONE HCL 5 MG PO CAPS: 5.0000 mg | ORAL_CAPSULE | Freq: Two times a day (BID) | ORAL | 0 refills | Status: DC

## 2024-04-21 NOTE — Addendum Note (Signed)
 Encounter addended by: Janice Lynwood BROCKS on: 04/21/2024 11:30 AM  Actions taken: Imaging Exam ended

## 2024-04-21 NOTE — Progress Notes (Signed)
 Subjective:    Patient ID: Gina Key, female    DOB: 1946/06/16, 78 y.o.   MRN: [id number]  HPI    Gina Key is a 78 y.o. year old female  who  has a past medical history of Acid reflux, Allergy, Anxiety, DDD (degenerative disc disease), lumbar, Depression, Facet joint disease, GERD (gastroesophageal reflux disease), High cholesterol, Hypertension, Low vitamin D  level, OSA (obstructive sleep apnea), Primary osteoarthritis involving multiple joints, Seizures (HCC), and Sleep apnea.   They are presenting to PM&R clinic as a new patient for pain management evaluation. They were referred by [name], PA for treatment of chronic pain.  Patient reports her primary pain is in her right shoulder.  Pain is worse with activity and use of her shoulder.  She cannot lie on her right side due to this pain.  Pain is sharp, aching, stabbing and overall constant.   She was seen by Dr. [name] on 04/06/2023 and had a ultrasound-guided shoulder injection with methylprednisolone .  She reports this did help her pain for short time but pain has returned.  Patient was not interested in shoulder surgery or other joint surgery at this time.   She also has some pain in her right hip and left knee.  She feels like this is due to her altered gait.  She uses a cane and walker for ambulation.     She reports poor sleep at night.  Denies depression.   She is previously followed by [facility] for pain management and was treated with buprenorphine 10 mcg every 7 days and Oxycodone  10mg  as needed.   It appears by PDMP review that buprenorphine was discontinued and she was continued on Oxycodone  10mg  by [name] until Feb 2023.  Patient reports she moved to [facility] at this time and had not established with a different pain clinic.  She reports the oxycodone  was very helpful for her pain and allowed her to be much more active and use her arm for activities.  She denies any side effects  with the medication.     Red flag symptoms: No red flags for back pain endorsed in Hx or ROS   Medications tried: lidocaine  patch help  Topical medications  Nsaids Advil doesn't work Tylenol    didn't help  Opiates  Hydrocodone- didn't help much  Tramadol- stopped helping  Gabapentin / Lyrica  Denies  TCAs  Denies  SNRIs- Denies      Other treatments: PT- Did not help  TENs unit- Denies  Injections- Cortisone injections- dont help  Surgery   Rotator cuff sugery many years ago     Interval history 06/11/2023 Patient is here for follow-up regarding her chronic right shoulder, right hip and bilateral knee pain.  She report she continues to have severe pain in her right shoulder worsened with any kind of movement.  Today she feels that her right knee has been causing her more pain than her left knee.  She does not recall her right hip being a significant source of pain recently, however she has a family member with her who reports that patient continues to complain of right hip pain worsened with ambulation.  She has tried duloxetine  but has not noticed a significant change in her pain levels.    Interval history 07/23/23 Patient left knee pain is greater than she had constant severe pain in her right shoulder.  She also has a pain in her right hip and both knees.  Today she reports left knee pain has been worse than right knee pain.  Patient has tried oxycodone  5 mg, this provides mild benefit but has not been strong enough to keep her pain controlled.  She has occasionally had constipation- chronic issue, had recent bowel movement after use of laxative.  She does not use regular stool softeners.  Interval history 09/03/23 Gina Key is here for a left knee injection.  She continues to have pain in her right shoulder, left knee greater than right knee, right hip.  She reports poor benefit with oxycodone  5 to 7.5 mg dose.  She tries to use the medication sparingly.  She says pain was much  more tolerable when she used 10 mg.  Denies any side effects with medication.   Interval history 12/14/23 Gina Key is here for right shoulder injection.  She continues to have right shoulder pain with ROM.  She reports her left knee is doing better since injection was completed last visit.  She uses oxycodone  as needed when pain is particularly severe.  She tries to use this medication sparingly only when pain is very severe and it was last filled in January.  When she does use medication she will take 2 tabs, she reports poor benefit when just taking 1 tab.  No side effects with the medication.  Prior UDS results: No results found for: LABOPIA, COCAINSCRNUR, LABBENZ, AMPHETMU, THCU, LABBARB   Interval history 04/21/24 Reports a new diagnosis of pancreatic cancer, which was an incidental finding. Currently undergoing chemotherapy. Reports fatigue since starting chemotherapy.  Pain medication is keeping pain under control. The primary site of pain is the shoulder. The last injection in the shoulder provided good relief and is still helping. Pain medication is taken on an as-needed basis, typically twice a day. The caregiver manages medication administration and will only provide it when pain is significant. States they take it for severe pain that affects sleep or comfort, but tries to avoid it otherwise due to concerns about side effects (drowsiness) and addiction. The current dose has been stable. Hip and knee pain are not significant at this time. The right hip is not currently painful. Pain medication is primarily used for shoulder pain but has been taken for hip pain in the past. Laying on the right side is difficult due to shoulder pain.  The oncologist has recommended over-the-counter Tylenol  for pain from the cancer or chemotherapy. Discussed that if cancer pain becomes severe, the oncology team will sometimes manages opioid prescriptions. Happy to coordinate care with  them.   Pain Inventory Average Pain 5 Pain Right Now 3 My pain is constant and aching  In the last 24 hours, has pain interfered with the following? General activity 2 Relation with others 0 Enjoyment of life 0 What TIME of day is your pain at its worst? night Sleep (in general) Poor  Pain is worse with: unsure Pain improves with: medication Relief from Meds: 9    Family History  Problem Relation Age of Onset   Pancreatic cancer Mother    Thyroid  cancer Sister    Colon polyps Daughter    Kidney cancer Daughter    Sleep apnea Daughter    Breast cancer Maternal Aunt 44   Seizures Paternal Grandmother    Cancer Brother        tongue   Sleep apnea Son    Stroke Son    Seizures Son    Sleep apnea Son    Seizures Grandson  Colon cancer Neg Hx    Crohn's disease Neg Hx    Esophageal cancer Neg Hx    Rectal cancer Neg Hx    Stomach cancer Neg Hx    Ulcerative colitis Neg Hx    Dementia Neg Hx    Social History   Socioeconomic History   Marital status: Widowed    Spouse name: Not on file   Number of children: 6   Years of education: 54   Highest education level: Not on file  Occupational History   Not on file  Tobacco Use   Smoking status: Never    Passive exposure: Never   Smokeless tobacco: Never  Vaping Use   Vaping status: Never Used  Substance and Sexual Activity   Alcohol use: Not Currently   Drug use: Never   Sexual activity: Not on file  Other Topics Concern   Not on file  Social History Narrative   Lives at home with daughter   Right handed   Caffeine: none    Social Drivers of Corporate investment banker Strain: Low Risk  (02/20/2024)   Overall Financial Resource Strain (CARDIA)    Difficulty of Paying Living Expenses: Not hard at all  Food Insecurity: No Food Insecurity (03/17/2024)   Hunger Vital Sign    Worried About Running Out of Food in the Last Year: Never true    Ran Out of Food in the Last Year: Never true  Transportation  Needs: No Transportation Needs (03/17/2024)   PRAPARE - Administrator, Civil Service (Medical): No    Lack of Transportation (Non-Medical): No  Physical Activity: Inactive (02/20/2024)   Exercise Vital Sign    Days of Exercise per Week: 0 days    Minutes of Exercise per Session: 0 min  Stress: No Stress Concern Present (02/20/2024)   [employer] of Occupational Health - Occupational Stress Questionnaire    Feeling of Stress: Not at all  Social Connections: Moderately Integrated (02/20/2024)   Social Connection and Isolation Panel    Frequency of Communication with Friends and Family: More than three times a week    Frequency of Social Gatherings with Friends and Family: More than three times a week    Attends Religious Services: More than 4 times per year    Active Member of [employer] or Organizations: Yes    Attends Banker Meetings: More than 4 times per year    Marital Status: Widowed   Past Surgical History:  Procedure Laterality Date   ABDOMINAL HYSTERECTOMY     BREAST BIOPSY Right    benign   CHOLECYSTECTOMY     ESOPHAGOGASTRODUODENOSCOPY N/A 03/03/2024   Procedure: EGD (ESOPHAGOGASTRODUODENOSCOPY);  Surgeon: Wilhelmenia Aloha Raddle., MD;  Location: [facility];  Service: Gastroenterology;  Laterality: N/A;   EUS N/A 03/03/2024   Procedure: ULTRASOUND, UPPER GI TRACT, ENDOSCOPIC;  Surgeon: Wilhelmenia Aloha Raddle., MD;  Location: [facility];  Service: Gastroenterology;  Laterality: N/A;   FINE NEEDLE ASPIRATION  03/03/2024   Procedure: FINE NEEDLE ASPIRATION;  Surgeon: Wilhelmenia, Aloha Raddle., MD;  Location: [facility];  Service: Gastroenterology;;   IR IMAGING GUIDED PORT INSERTION  03/31/2024   left and right rotator cuff     Past Medical History:  Diagnosis Date   Acid reflux    Allergy    Anxiety    DDD (degenerative disc disease), lumbar    Depression    Facet joint disease    GERD (gastroesophageal reflux disease)    High  cholesterol     Hypertension    Low vitamin D  level    OSA (obstructive sleep apnea)    Primary osteoarthritis involving multiple joints    Pulmonary embolism (HCC)    Seizures (HCC)    Sleep apnea    C PAP   BP 138/79   Pulse 76   Ht 5' 2 (1.575 m)   Wt 212 lb (96.2 kg)   SpO2 97%   BMI 38.78 kg/m   Opioid Risk Score:   Fall Risk Score:  `1  Depression screen [facility] 2/9     04/11/2024   12:48 PM 03/14/2024    3:22 PM 02/20/2024    1:53 PM 02/06/2024   10:27 AM 09/27/2023    4:21 PM 09/03/2023    3:26 PM 07/23/2023    3:00 PM  Depression screen PHQ 2/9  Decreased Interest 0 0 0 0 0 0 0  Down, Depressed, Hopeless 0 0 0 0 0 0 0  PHQ - 2 Score 0 0 0 0 0 0 0  Altered sleeping   0 1 3    Tired, decreased energy   0 0 2    Change in appetite   0 0 1    Feeling bad or failure about yourself    0 0 0    Trouble concentrating   0 0 0    Moving slowly or fidgety/restless   0 0 0    Suicidal thoughts   0 0 0    PHQ-9 Score   0 1 6    Difficult doing work/chores     Not difficult at all      Review of Systems  Constitutional: Negative.   HENT: Negative.    Respiratory:  Negative for apnea.   Cardiovascular: Negative.   Gastrointestinal: Negative.        Liquid stools - Gallbladder removed years ago  Endocrine: Negative.   Genitourinary: Negative.   Musculoskeletal:  Positive for gait problem.       Right shoulder  Skin: Negative.   Allergic/Immunologic: Negative.   Hematological: Negative.   Psychiatric/Behavioral: Negative.    All other systems reviewed and are negative.      Objective:   Physical Exam   Gen: no distress, normal appearing HEENT: oral mucosa pink and moist, NCAT Chest: normal effort, normal rate of breathing Abd: soft, non-distended Ext: no edema Psych: pleasant, normal affect Skin: intact Neuro: Alert and awake, follows simple commands, hard of hearing  RUE: 4-/5 Deltoid-limited by pain, 5/5 Biceps, 5/5 Triceps,  5/5 Grip LUE: 5/5 Deltoid, 5/5 Biceps, 5/5  Triceps, 5/5 Grip RLE: HF 5/5, KE 5/5, KF 5/5, ADF 5/5, APF 5/5 LLE: HF 5/5, KE 5/5, HF, 5/5, ADF 5/5, APF 5/5 Sensory exam intact for light touch and pain in all 4 limbs.   Musculoskeletal:  Right shoulder painful with passive ROM in all directions, pain to palpation anterior shoulder Minimal pain with R hip rotation today Minimal Knee joint line ttp   Shoulder R xray 03/16/23 The transscapular and axillary views are limited. Significant degenerative change in the glenohumeral joint with osteophytes off the inferior glenoid and medial humeral head. AC joint degenerative change. No fracture. No evidence of dislocation. No other bony or soft tissue abnormalities identified.   IMPRESSION: Degenerative changes as above. No fracture or dislocation identified.    C spine CT 12/11/21 Alignment: Normal.   Skull base and vertebrae: No acute fracture. No primary bone lesion or focal pathologic process.  Soft tissues and spinal canal: No prevertebral fluid or swelling. No visible canal hematoma.   Disc levels: Multilevel degenerative disc disease/spondylosis and RIGHT facet arthropathy noted. The following findings are noted:   C2-3: A small central disc protrusion is noted with moderate RIGHT bony foraminal narrowing   C3-4: Mild central spinal and moderate RIGHT bony foraminal narrowing   C4-5: Moderate central spinal and mild to moderate RIGHT bony foraminal narrowing   C5-C6: With broad-based disc bulge, mild to moderate central spinal and moderate bony biforaminal narrowing   C6-7: With broad-based disc bulge, mild central spinal, mild RIGHT and moderate LEFT bony foraminal narrowing   Upper chest: No acute abnormality   Other: None   IMPRESSION: 1. No evidence of acute intracranial abnormality. 2. No static evidence of acute injury to the cervical spine. Degenerative changes as described.       Assessment & Plan:   1) Chronic Right shoulder OA, history of  rotator cuff impingement on the right shoulder 2) Chronic Right hip pain, suspect OA 3) Chronic B/L knee OA 4) New diagnosis Pancreatic Cancer  --Continue UDS and pill counts.  Continue PDMP monitoring.  Pain contract completed prior visit. -Adjusted pain medication regimen: Change from oxycodone  10 mg to oxycodone  5 mg.     -   Plan is to take 1 tablet (5 mg) for moderate pain (e.g., 6-7/10) and 2 tablets (10 mg) for severe pain (e.g., 8-10/10).     -   Counseled that this allows for more flexible dosing, may reduce side effects like drowsiness, and may help the prescription last longer. -Continue duloxetine  20mg  daily for MSK pain, consider trying higher dose at future visit  -Family is monitoring her medication use and only giving her medication when she is reporting significant pain-I think this is a good idea to continue -Xray B/L Knees completed - OA noted  -Colace daily for stool softener, miralax if stronger medication needed - Left knee injection completed prior visit with benefit - Right shoulder injection Prior visit - plan for repeat next visit unless she changed her mind about this - Drug screen (oral swab) was collected today for routine monitoring

## 2024-04-24 DIAGNOSIS — R310 Gross hematuria: Secondary | ICD-10-CM | POA: Diagnosis not present

## 2024-04-24 DIAGNOSIS — R399 Unspecified symptoms and signs involving the genitourinary system: Secondary | ICD-10-CM | POA: Diagnosis not present

## 2024-04-24 LAB — DRUG TOX MONITOR 1 W/CONF, ORAL FLD

## 2024-04-24 LAB — DRUG TOX ALC METAB W/CON, ORAL FLD: Alcohol Metabolite: NEGATIVE ng/mL (ref <25)

## 2024-04-25 ENCOUNTER — Encounter: Payer: Self-pay | Admitting: Oncology

## 2024-04-25 ENCOUNTER — Inpatient Hospital Stay

## 2024-04-25 ENCOUNTER — Inpatient Hospital Stay: Admitting: Dietician

## 2024-04-25 ENCOUNTER — Inpatient Hospital Stay: Attending: Physician Assistant | Admitting: Oncology

## 2024-04-25 ENCOUNTER — Other Ambulatory Visit: Payer: Self-pay

## 2024-04-25 VITALS — BP 136/69 | HR 58 | Temp 97.5°F | Resp 16

## 2024-04-25 VITALS — BP 135/64 | HR 69 | Temp 97.7°F | Resp 17 | Ht 62.0 in | Wt 213.0 lb

## 2024-04-25 DIAGNOSIS — Z7901 Long term (current) use of anticoagulants: Secondary | ICD-10-CM | POA: Diagnosis not present

## 2024-04-25 DIAGNOSIS — Z86718 Personal history of other venous thrombosis and embolism: Secondary | ICD-10-CM | POA: Diagnosis not present

## 2024-04-25 DIAGNOSIS — C259 Malignant neoplasm of pancreas, unspecified: Secondary | ICD-10-CM

## 2024-04-25 DIAGNOSIS — Z5111 Encounter for antineoplastic chemotherapy: Secondary | ICD-10-CM | POA: Insufficient documentation

## 2024-04-25 LAB — CMP (CANCER CENTER ONLY)
ALT: 23 U/L (ref 0–44)
AST: 22 U/L (ref 15–41)
Albumin: 4.2 g/dL (ref 3.5–5.0)
Alkaline Phosphatase: 105 U/L (ref 38–126)
Anion gap: 3 — ABNORMAL LOW (ref 5–15)
BUN: 15 mg/dL (ref 8–23)
CO2: 25 mmol/L (ref 22–32)
Calcium: 9.6 mg/dL (ref 8.9–10.3)
Chloride: 115 mmol/L — ABNORMAL HIGH (ref 98–111)
Creatinine: 0.86 mg/dL (ref 0.44–1.00)
GFR, Estimated: >60 mL/min (ref >60)
Glucose, Bld: 117 mg/dL — ABNORMAL HIGH (ref 70–99)
Potassium: 3.6 mmol/L (ref 3.5–5.1)
Sodium: 143 mmol/L (ref 135–145)
Total Bilirubin: 0.4 mg/dL (ref 0.0–1.2)
Total Protein: 7.3 g/dL (ref 6.5–8.1)

## 2024-04-25 LAB — CBC WITH DIFFERENTIAL (CANCER CENTER ONLY)
Abs Immature Granulocytes: 0.01 K/uL (ref 0.00–0.07)
Basophils Absolute: 0 K/uL (ref 0.0–0.1)
Basophils Relative: 1 %
Eosinophils Absolute: 0.1 K/uL (ref 0.0–0.5)
Eosinophils Relative: 1 %
HCT: 34.2 % — ABNORMAL LOW (ref 36.0–46.0)
Hemoglobin: 10.5 g/dL — ABNORMAL LOW (ref 12.0–15.0)
Immature Granulocytes: 0 %
Lymphocytes Relative: 33 %
Lymphs Abs: 1.7 K/uL (ref 0.7–4.0)
MCH: 26.4 pg (ref 26.0–34.0)
MCHC: 30.7 g/dL (ref 30.0–36.0)
MCV: 86.1 fL (ref 80.0–100.0)
Monocytes Absolute: 0.5 K/uL (ref 0.1–1.0)
Monocytes Relative: 9 %
Neutro Abs: 2.9 K/uL (ref 1.7–7.7)
Neutrophils Relative %: 56 %
Platelet Count: 158 K/uL (ref 150–400)
RBC: 3.97 MIL/uL (ref 3.87–5.11)
RDW: 13.4 % (ref 11.5–15.5)
WBC Count: 5.1 K/uL (ref 4.0–10.5)
nRBC: 0 % (ref 0.0–0.2)

## 2024-04-25 MED ORDER — SODIUM CHLORIDE 0.9 % IV SOLN
1000.0000 mg/m2 | Freq: Once | INTRAVENOUS | Status: AC
  Administered 2024-04-25: 2052 mg via INTRAVENOUS
  Filled 2024-04-25: qty 53.97

## 2024-04-25 MED ORDER — SODIUM CHLORIDE 0.9 % IV SOLN: INTRAVENOUS | Status: DC

## 2024-04-25 MED ORDER — PROCHLORPERAZINE MALEATE 10 MG PO TABS
10.0000 mg | ORAL_TABLET | Freq: Once | ORAL | Status: AC
  Administered 2024-04-25: 10 mg via ORAL
  Filled 2024-04-25: qty 1

## 2024-04-25 MED ORDER — PACLITAXEL PROTEIN-BOUND CHEMO INJECTION 100 MG
125.0000 mg/m2 | Freq: Once | INTRAVENOUS | Status: AC
  Administered 2024-04-25: 250 mg via INTRAVENOUS
  Filled 2024-04-25: qty 50

## 2024-04-25 NOTE — Progress Notes (Signed)
 I reviewed patient visit documented by social work Tax inspector. I concur with the treatment plan as documented in the SW intern's note.   Karesha Trzcinski E Costas Sena, LCSW Clinical Child psychotherapist

## 2024-04-25 NOTE — Progress Notes (Signed)
 [facility] CSW Progress Note  Clinical Social Work Intern met with patient and her daughter in infusion suite to follow-up on emotional support per referral from CSW. Pt reports she has been doing well overall since last treatment, and has support through family, friends, and church members who check on her often. Pt's daughter and 2 grandchildren live with her and daughter is active in managing her care. Pt expressed that she does have difficult days and Intern validated the emotional ups and downs common to diagnosis and treatment, provided active listening, and affirmed positive coping skills pt is already using.    Interventions: Provided brief mental health counseling with regard to accepting negative emotions when they occur, and coping skills for managing difficult days.         Follow Up Plan:  CSW will see patient on 10/17 at next Infusion treatment. Intern gave pt and daughter direct contact information if any needs come up in the meantime.    Thersia KATHEE Daring Clinical Social Work Intern [facility]

## 2024-04-25 NOTE — Assessment & Plan Note (Signed)
 She was seen in our rapid diagnostic clinic on 02/12/2024 for consultation and was referred to GI for EUS/biopsy.  Labs on that day showed a CA 19-9 of 8, CEA undetectable, chromogranin A increased at 224.8 ng/mL.  CBCD and CMP were unremarkable with normal LFTs.  On 03/03/2024 Dr. Wilhelmenia performed EGD and EUS.  On EUS, mass was identified in the pancreatic body.  Endosonographic appearance was highly suspicious for adenocarcinoma.  Staged as T1c, N0, MX.  FNA performed.  There was no sign of significant pathology in the CBD and in the common hepatic duct.  No malignant appearing lymph nodes were visualized in the celiac region, peripancreatic region and porta hepatis.  A cyst was found in the visualized portion of the liver measured 26 mm x 23 mm.  No evidence of any solid mass lesions in the left lobe of liver.  FNA from pancreatic mass came back positive for adenocarcinoma.  On 03/11/2024, staging PET scan showed ill-defined heterogeneous low-level hypermetabolic in the mid pancreas, corresponding to the abnormality on previous MRI.  Features compatible with biopsy-proven pancreatic adenocarcinoma.  16 x 11 mm focus of amorphous soft tissue in the gastrohepatic ligament was hypermetabolic and concerning for metastatic disease.  No evidence of hypermetabolic liver metastasis.  No evidence of metastatic disease elsewhere.  Her case was discussed in GI tumor conference on 03/19/2024. Uncertainty remains regarding the second spot in the gastrohepatic ligament, which may be post-biopsy inflammation rather than cancer.   Genetic testing came back negative for any deleterious germline mutations.  Patient had consultation with Dr. 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  The location of this does appear to be relatively central.  It is unclear to me whether this is proximal enough in the body that would require a Whipple or a distal pancreas.  It is not well-seen on imaging.  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 We will start with neoadjuvant chemotherapy.  I will then get repeat imaging.  I do want a pancreatic protocol follow-up scan.   Plan for neoadjuvant chemotherapy with gemcitabine  and Abraxane , dose reduced for her age and comorbidities, followed by surgical evaluation.  Initially will plan for chemotherapy on days 1, 15 of 28-day cycle and if well-tolerated, we will attempt to do chemo on days 1, 8, 15 of 28-day cycle.  We started with 20% dose reduction of gemcitabine  and Abraxane .   Tolerated cycle 1 day 1 of chemotherapy reasonably well without major side effects.  Had mild nausea for couple of days after chemotherapy but no other major issues.  Labs reveal no dose-limiting toxicities today.  Will proceed with cycle 1 day 15 of gemcitabine  and Abraxane  but we will proceed with full strength.  Plan to continue this regimen for at least three to four months before reassessing with a scan.  RTC in 2 weeks for cycle 2-day 1 of chemotherapy.

## 2024-04-25 NOTE — Progress Notes (Signed)
 [facility]  ONCOLOGY CLINIC PROGRESS NOTE   Patient Care Team: [name], MD as PCP - General (Family Medicine) Mansouraty, Aloha Raddle., MD as Consulting Physician (Gastroenterology)  PATIENT NAME: Gina Key   MR#: [id number] DOB: 1945/09/11  Date of visit: 04/25/2024   ASSESSMENT & PLAN:   Gina Key is a 78 y.o.  lady with a past medical history of hypertension, history of pulmonary embolism in March 2025, seizure disorder, obstructive sleep apnea, arthritis, GERD, was referred to our clinic in August 2025 for pancreatic adenocarcinoma.   Pancreatic adenocarcinoma [facility]) She was seen in our rapid diagnostic clinic on 02/12/2024 for consultation and was referred to GI for EUS/biopsy.  Labs on that day showed a CA 19-9 of 8, CEA undetectable, chromogranin A increased at 224.8 ng/mL.  CBCD and CMP were unremarkable with normal LFTs.  On 03/03/2024 Dr. Wilhelmenia performed EGD and EUS.  On EUS, mass was identified in the pancreatic body.  Endosonographic appearance was highly suspicious for adenocarcinoma.  Staged as T1c, N0, MX.  FNA performed.  There was no sign of significant pathology in the CBD and in the common hepatic duct.  No malignant appearing lymph nodes were visualized in the celiac region, peripancreatic region and porta hepatis.  A cyst was found in the visualized portion of the liver measured 26 mm x 23 mm.  No evidence of any solid mass lesions in the left lobe of liver.  FNA from pancreatic mass came back positive for adenocarcinoma.  On 03/11/2024, staging PET scan showed ill-defined heterogeneous low-level hypermetabolic in the mid pancreas, corresponding to the abnormality on previous MRI.  Features compatible with biopsy-proven pancreatic adenocarcinoma.  16 x 11 mm focus of amorphous soft tissue in the gastrohepatic ligament was hypermetabolic and concerning for metastatic disease.  No evidence of hypermetabolic liver metastasis.  No  evidence of metastatic disease elsewhere.  Her case was discussed in GI tumor conference on 03/19/2024. Uncertainty remains regarding the second spot in the gastrohepatic ligament, which may be post-biopsy inflammation rather than cancer.   Genetic testing came back negative for any deleterious germline mutations.  Patient had consultation with Dr. 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  The location of this does appear to be relatively central.  It is unclear to me whether this is proximal enough in the body that would require a Whipple or a distal pancreas.  It is not well-seen on imaging.  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 We will start with neoadjuvant chemotherapy.  I will then get repeat imaging.  I do want a pancreatic protocol follow-up scan.   Plan for neoadjuvant chemotherapy with gemcitabine  and Abraxane , dose reduced for her age and comorbidities, followed by surgical evaluation.  Initially will plan for chemotherapy on days 1, 15 of 28-day cycle and if well-tolerated, we will attempt to do chemo on days 1, 8, 15 of 28-day cycle.  We started with 20% dose reduction of gemcitabine  and Abraxane .   Tolerated cycle 1 day 1 of chemotherapy reasonably well without major side effects.  Had mild nausea for couple of days after chemotherapy but no other major issues.  Labs reveal no dose-limiting toxicities today.  Will proceed with cycle 1 day 15 of gemcitabine  and Abraxane  but we will proceed with full strength.  Plan to continue this regimen for at least three to four months before reassessing  with a scan.  RTC in 2 weeks for cycle 2-day 1 of chemotherapy.  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 Blood counts are well-managed. - Continue Eliquis  for anticoagulation. - Monitor blood counts regularly during chemotherapy.  Suspected  post-biopsy inflammation in gastrohepatic ligament Suspected post-biopsy inflammation in the gastrohepatic ligament, with uncertainty about whether it represents cancer. PET scan showed activity, but MRI and endoscopic ultrasound did not confirm cancer. Radiologist suggests it may be inflammation rather than cancer. - Monitor for signs of pancreatitis with additional labs, including lipase levels.  I reviewed lab results and outside records for this visit and discussed relevant results with the patient. Diagnosis, plan of care and treatment options were also discussed in detail with the patient. Opportunity provided to ask questions and answers provided to her apparent satisfaction. Provided instructions to call our clinic with any problems, questions or concerns prior to return visit. I recommended to continue follow-up with PCP and sub-specialists. She verbalized understanding and agreed with the plan.   NCCN guidelines have been consulted in the planning of this patient's care.  I spent a total of 40 minutes during this encounter with the patient including review of chart and various tests results, discussions about plan of care and coordination of care plan.   [name], MD  04/25/2024 3:12 PM  [facility] - A DEPT OF [facility] [address] [place] [address] Dept: [phone] Dept Fax: [phone]    CHIEF COMPLAINT/ REASON FOR VISIT:   Adenocarcinoma of pancreas, Stage I vs stage II  Current Treatment: Plan for neoadjuvant chemotherapy with dose reduce gemcitabine  and Abraxane , followed by surgical evaluation.  INTERVAL HISTORY:    Discussed the use of AI scribe software for clinical note transcription with the patient, who gave verbal consent to proceed.  History of Present Illness  Gina Key is a 78 year old female undergoing chemotherapy who presents for follow-up regarding her  treatment.  She is currently undergoing chemotherapy and initially experienced diarrhea, which was effectively managed with medication. She now has no issues with bowel movements or diarrhea.  She reports mild fatigue lasting a couple of days. No nausea, tingling, or numbness in her hands and feet. Her appetite remains good.   I have reviewed the past medical history, past surgical history, social history and family history with the patient and they are unchanged from previous note.  HISTORY OF PRESENT ILLNESS:   ONCOLOGY HISTORY:   On review of the previous records patient was evaluated in the emergency department on 02/05/2024 for gross hematuria.  CT abdomen pelvis was performed which showed moderate dilation of the pancreatic duct in the mid body and tail overlying and parenchymal atrophy. MR abdomen was ordered by PCP Dr. Mercer and performed on 02/07/24. MRI showed a focal segment of pancreatic ductal dilatation with abrupt caliber change and stricture. Subtle mass lesion in this location identified measuring 17 x 15 mm at the level of the caliber change and has appearance worrisome for neoplasm such as an adenocarcinoma. Image did not show any other areas concerning for malignant disease.    CMP collected 02/05/24 in the ED was overall unremarkable. Further chart review shows patient was diagnosed with a submassive PE with right heart strain 10/05/23 and is currently taking Eliquis .     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 She does not smoke or drink alcohol. She  currently lives with her daughter due to past seizures. Her last colonoscopy in February 2024 was normal, as was her last mammogram. Her family history is significant for her mother having pancreatic cancer at age 54, her brother having throat cancer, and her daughter having kidney cancer.   She was seen in our rapid diagnostic clinic on 02/12/2024 for consultation and was referred to GI for EUS/biopsy.  Labs on that  day showed a CA 19-9 of 8, CEA undetectable, chromogranin A increased at 224.8 ng/mL.  CBCD and CMP were unremarkable with normal LFTs.   On 03/03/2024 Dr. Wilhelmenia performed EGD and EUS.  On EUS, mass was identified in the pancreatic body.  Endosonographic appearance was highly suspicious for adenocarcinoma.  Staged as T1c, N0, MX.  FNA performed.  There was no sign of significant pathology in the CBD and in the common hepatic duct.  No malignant appearing lymph nodes were visualized in the celiac region, peripancreatic region and porta hepatis.  A cyst was found in the visualized portion of the liver measured 26 mm x 23 mm.  No evidence of any solid mass lesions in the left lobe of liver.   FNA from pancreatic mass came back positive for adenocarcinoma.   On 03/11/2024, staging PET scan showed ill-defined heterogeneous low-level hypermetabolic in the mid pancreas, corresponding to the abnormality on previous MRI.  Features compatible with biopsy-proven pancreatic adenocarcinoma.  16 x 11 mm focus of amorphous soft tissue in the gastrohepatic ligament was hypermetabolic and concerning for metastatic disease.  No evidence of hypermetabolic liver metastasis.  No evidence of metastatic disease elsewhere.   Her case was discussed in GI tumor conference on 03/19/2024. Uncertainty remains regarding the second spot in the gastrohepatic ligament, which may be post-biopsy inflammation rather than cancer.   Genetic testing came back negative for any deleterious germline mutations.  Patient had consultation with Dr. 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  The location of this does appear to be relatively central.  It is unclear to me whether this is proximal enough in the body that would require a Whipple or a distal pancreas.  It is not well-seen on imaging.  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 We will start with  neoadjuvant chemotherapy.  I will then get repeat imaging.  I do want a pancreatic protocol follow-up scan.   Plan for neoadjuvant chemotherapy with gemcitabine  and Abraxane , dose reduced for her age and comorbidities, followed by surgical evaluation.  Started systemic treatment with gemcitabine  and Abraxane  from 04/11/2024.  Oncology History  Pancreatic adenocarcinoma (HCC)  03/03/2024 Initial Diagnosis   Pancreatic adenocarcinoma (HCC)   03/21/2024 Cancer Staging   Staging form: Exocrine Pancreas, AJCC 8th Edition - Clinical: Stage IIB (cT1c, cN1, cM0) - Signed by Autumn Millman, MD on 04/11/2024   03/31/2024 Genetic Testing   Negative genetic testing. No pathogenic variants identified on the [facility] CancerNext-Expanded+RNA Panel. The report date is 03/31/2024.   The CancerNext-Expanded gene panel offered by [facility] and includes sequencing, rearrangement, and RNA analysis for the following 77 genes: AIP, ALK, APC, ATM, AXIN2, BAP1, BARD1, BMPR1A, BRCA1, BRCA2, BRIP1, CDC73, CDH1, CDK4, CDKN1B, CDKN2A, CEBPA, CHEK2, CTNNA1, DDX41, DICER1, ETV6, FH, FLCN, GATA2, LZTR1, MAX, MBD4, MEN1, MET, MLH1, MSH2, MSH3, MSH6, MUTYH, NF1, NF2, NTHL1, PALB2, PHOX2B, PMS2, POT1, PRKAR1A, PTCH1, PTEN, RAD51C, RAD51D, RB1, RET, RPS20, RUNX1, SDHA, SDHAF2, SDHB, SDHC, SDHD, SMAD4, SMARCA4, SMARCB1, SMARCE1,  STK11, SUFU, TMEM127, TP53, TSC1, TSC2, VHL, and WT1 (sequencing and deletion/duplication); EGFR, HOXB13, KIT, MITF, PDGFRA, POLD1, and POLE (sequencing only); EPCAM and GREM1 (deletion/duplication only).    04/11/2024 -  Chemotherapy   Patient is on Treatment Plan : PANCREATIC Abraxane  D1,15 + Gemcitabine  D1,15 q28d         REVIEW OF SYSTEMS:   Review of Systems - Oncology  All other pertinent systems were reviewed with the patient and are negative.  ALLERGIES: She has no known allergies.  MEDICATIONS:  Current Outpatient Medications  Medication Sig Dispense Refill   ELIQUIS  5 MG TABS tablet TAKE  1 TABLET BY MOUTH TWICE A DAY 180 tablet 1   ferrous sulfate 325 (65 FE) MG tablet Take 325 mg by mouth at bedtime.     Fexofenadine HCl (ALLEGRA PO) Take by mouth.     Incontinence Supply Disposable (WINGS HL ADULT BRIEFS/XL) MISC As needed daily. 100 each 11   ipratropium (ATROVENT ) 0.06 % nasal spray PLACE 2 SPRAYS INTO BOTH NOSTRILS 4 TIMES DAILY. 45 mL 1   levETIRAcetam  (KEPPRA ) 500 MG tablet Take 1 tablet (500 mg total) by mouth 2 (two) times daily. 180 tablet 3   lidocaine  (HM LIDOCAINE  PATCH) 4 % Place 1 patch onto the skin daily as needed.     lidocaine -prilocaine  (EMLA ) cream Apply to affected area once 30 g 3   lipase/protease/amylase (CREON ) 36000 UNITS CPEP capsule Take 2 capsules (72,000 Units total) by mouth 3 (three) times daily with meals. May also take 1 capsule (36,000 Units total) as needed (with snacks - up to 4 snacks daily). 300 capsule 11   losartan  (COZAAR ) 100 MG tablet TAKE 1 TABLET BY MOUTH EVERY DAY 90 tablet 1   metoprolol  succinate (TOPROL -XL) 25 MG 24 hr tablet TAKE 1 TABLET (25 MG TOTAL) BY MOUTH DAILY. 90 tablet 1   montelukast  (SINGULAIR ) 10 MG tablet TAKE 1 TABLET BY MOUTH EVERYDAY AT BEDTIME 90 tablet 1   omeprazole (PRILOSEC) 20 MG capsule TAKE 1 CAPSULE EVERY DAY 90 capsule 1   ondansetron  (ZOFRAN ) 8 MG tablet Take 1 tablet (8 mg total) by mouth every 8 (eight) hours as needed for nausea or vomiting. 30 tablet 1   oxycodone  (OXY-IR) 5 MG capsule Take 1-2 capsules (5-10 mg total) by mouth 2 (two) times daily. 90 capsule 0   potassium chloride  (KLOR-CON ) 10 MEQ tablet TAKE 1 TABLET BY MOUTH EVERY DAY 90 tablet 0   prochlorperazine  (COMPAZINE ) 10 MG tablet Take 1 tablet (10 mg total) by mouth every 6 (six) hours as needed for nausea or vomiting. 30 tablet 1   rosuvastatin  (CRESTOR ) 20 MG tablet Take 1 tablet (20 mg total) by mouth daily. 90 tablet 3   spironolactone  (ALDACTONE ) 25 MG tablet TAKE 1 TABLET (25 MG TOTAL) BY MOUTH DAILY. 90 tablet 1   topiramate   (TOPAMAX ) 100 MG tablet TAKE 1 TABLET BY MOUTH TWICE A DAY 180 tablet 1   Current Facility-Administered Medications  Medication Dose Route Frequency Provider Last Rate Last Admin   lidocaine  (XYLOCAINE ) 1 % (with pres) injection 1 mL  1 mL Other Once        lidocaine  (XYLOCAINE ) 1 % (with pres) injection 4 mL  4 mL Other Once        triamcinolone  acetonide (KENALOG -40) injection 40 mg  40 mg Intramuscular Once        triamcinolone  acetonide (KENALOG -40) injection 40 mg  40 mg Intra-articular Once        Facility-Administered  Medications Ordered in Other Visits  Medication Dose Route Frequency Provider Last Rate Last Admin   0.9 %  sodium chloride  infusion   Intravenous Continuous Fareeha Evon, MD   Stopped at 04/25/24 1221     VITALS:   Blood pressure 135/64, pulse 69, temperature 97.7 F (36.5 C), temperature source Temporal, resp. rate 17, height 5' 2 (1.575 m), weight 213 lb (96.6 kg), SpO2 100%.  Wt Readings from Last 3 Encounters:  04/25/24 213 lb (96.6 kg)  04/21/24 212 lb (96.2 kg)  04/11/24 213 lb (96.6 kg)    Body mass index is 38.96 kg/m.    Onc Performance Status - 04/25/24 1500       ECOG Perf Status   ECOG Perf Status Capable of only limited selfcare, confined to bed or chair more than 50% of waking hours      KPS SCALE   KPS % SCORE Cares for self, unable to carry on normal activity or to do active work            PHYSICAL EXAM:   Physical Exam Constitutional:      General: She is not in acute distress.    Appearance: Normal appearance.  HENT:     Head: Normocephalic and atraumatic.  Cardiovascular:     Rate and Rhythm: Normal rate.  Pulmonary:     Effort: Pulmonary effort is normal. No respiratory distress.  Abdominal:     General: There is no distension.  Neurological:     General: No focal deficit present.     Mental Status: She is alert and oriented to person, place, and time.  Psychiatric:        Mood and Affect: Mood normal.         Behavior: Behavior normal.       LABORATORY DATA:   I have reviewed the data as listed.  Results for orders placed or performed in visit on 04/25/24  CMP (Cancer Center only)  Result Value Ref Range   Sodium 143 135 - 145 mmol/L   Potassium 3.6 3.5 - 5.1 mmol/L   Chloride 115 (H) 98 - 111 mmol/L   CO2 25 22 - 32 mmol/L   Glucose, Bld 117 (H) 70 - 99 mg/dL   BUN 15 8 - 23 mg/dL   Creatinine 9.13 9.55 - 1.00 mg/dL   Calcium  9.6 8.9 - 10.3 mg/dL   Total Protein 7.3 6.5 - 8.1 g/dL   Albumin 4.2 3.5 - 5.0 g/dL   AST 22 15 - 41 U/L   ALT 23 0 - 44 U/L   Alkaline Phosphatase 105 38 - 126 U/L   Total Bilirubin 0.4 0.0 - 1.2 mg/dL   GFR, Estimated >39 >39 mL/min   Anion gap 3 (L) 5 - 15  CBC with Differential (Cancer Center Only)  Result Value Ref Range   WBC Count 5.1 4.0 - 10.5 K/uL   RBC 3.97 3.87 - 5.11 MIL/uL   Hemoglobin 10.5 (L) 12.0 - 15.0 g/dL   HCT 65.7 (L) 63.9 - 53.9 %   MCV 86.1 80.0 - 100.0 fL   MCH 26.4 26.0 - 34.0 pg   MCHC 30.7 30.0 - 36.0 g/dL   RDW 86.5 88.4 - 84.4 %   Platelet Count 158 150 - 400 K/uL   nRBC 0.0 0.0 - 0.2 %   Neutrophils Relative % 56 %   Neutro Abs 2.9 1.7 - 7.7 K/uL   Lymphocytes Relative 33 %   Lymphs Abs 1.7 0.7 -  4.0 K/uL   Monocytes Relative 9 %   Monocytes Absolute 0.5 0.1 - 1.0 K/uL   Eosinophils Relative 1 %   Eosinophils Absolute 0.1 0.0 - 0.5 K/uL   Basophils Relative 1 %   Basophils Absolute 0.0 0.0 - 0.1 K/uL   Immature Granulocytes 0 %   Abs Immature Granulocytes 0.01 0.00 - 0.07 K/uL      RADIOGRAPHIC STUDIES:  I have personally reviewed the radiological images as listed and agree with the findings in the report.  IR IMAGING GUIDED PORT INSERTION Result Date: 03/31/2024 INDICATION: Pancreatic adenocarcinoma.  Port-A-Cath needed for treatment. EXAM: FLUOROSCOPIC AND ULTRASOUND GUIDED PLACEMENT OF A SUBCUTANEOUS PORT MEDICATIONS: Moderate sedation ANESTHESIA/SEDATION: Moderate (conscious) sedation was employed during  this procedure. A total of Versed  2 mg and fentanyl  100 mcg was administered intravenously at the order of the provider performing the procedure. Total intra-service moderate sedation time: 23 minutes. Patient's level of consciousness and vital signs were monitored continuously by radiology nurse throughout the procedure under the supervision of the provider performing the procedure. FLUOROSCOPY TIME:  Radiation Exposure Index (as provided by the fluoroscopic device): 11 mGy Kerma COMPLICATIONS: None immediate. PROCEDURE: The procedure, risks, benefits, and alternatives were explained to the patient. Questions regarding the procedure were encouraged and answered. The patient understands and consents to the procedure. Patient was placed supine on the interventional table. Ultrasound confirmed a patent right internal jugular vein. Ultrasound image was saved for documentation. The right chest and neck were cleaned with a skin antiseptic and a sterile drape was placed. Maximal barrier sterile technique was utilized including caps, mask, sterile gowns, sterile gloves, sterile drape, hand hygiene and skin antiseptic. The right neck was anesthetized with 1% lidocaine . Small incision was made in the right neck with a blade. Micropuncture set was placed in the right internal jugular vein with ultrasound guidance. The micropuncture wire was used for measurement purposes. The right chest was anesthetized with 1% lidocaine  with epinephrine . #15 blade was used to make an incision and a subcutaneous port pocket was formed. 8 french Power Port was assembled. Subcutaneous tunnel was formed with a stiff tunneling device. The port catheter was brought through the subcutaneous tunnel. The port was placed in the subcutaneous pocket. The micropuncture set was exchanged for a peel-away sheath. The catheter was placed through the peel-away sheath and the tip was positioned at the superior cavoatrial junction. Catheter placement was  confirmed with fluoroscopy. The port was accessed and flushed with heparinized saline. The port pocket was closed using two layers of absorbable sutures and Dermabond. The vein skin site was closed using a single layer of absorbable suture and Dermabond. Sterile dressings were applied. Patient tolerated the procedure well without an immediate complication. Ultrasound and fluoroscopic images were taken and saved for this procedure. IMPRESSION: Placement of a subcutaneous power-injectable port device. Catheter tip at the superior cavoatrial junction. Electronically Signed   By: Juliene Balder M.D.   On: 03/31/2024 15:05      CODE STATUS:  Code Status History     Date Active Date Inactive Code Status Order ID Comments User Context   10/05/2023 0213 10/06/2023 1942 Full Code [id number]  Lee Kingfisher, MD ED    Questions for Most Recent Historical Code Status (Order [id number])     Question Answer   By: Consent: discussion documented in EHR            Orders Placed This Encounter  Procedures   CBC with Differential (Cancer Center Only)  Standing Status:   Future    Expected Date:   06/06/2024    Expiration Date:   06/06/2025   CMP (Cancer Center only)    Standing Status:   Future    Expected Date:   06/06/2024    Expiration Date:   06/06/2025   CBC with Differential (Cancer Center Only)    Standing Status:   Future    Expected Date:   06/13/2024    Expiration Date:   06/13/2025   CMP (Cancer Center only)    Standing Status:   Future    Expected Date:   06/13/2024    Expiration Date:   06/13/2025   CBC with Differential (Cancer Center Only)    Standing Status:   Future    Expected Date:   06/20/2024    Expiration Date:   06/20/2025   CMP (Cancer Center only)    Standing Status:   Future    Expected Date:   06/20/2024    Expiration Date:   06/20/2025     Future Appointments  Date Time Provider Department Center  05/01/2024  5:00 PM Abran Mabel SAUNDERS, PT [facility]   05/07/2024  5:00 PM Zenaida Reyes HERO, PT [facility]  05/09/2024  8:45 AM [facility] FLUSH [facility] None  05/09/2024  9:15 AM Natividad Halls, Chinita, MD [facility] None  05/09/2024 10:00 AM [facility] INFUSION [facility] None  05/14/2024  5:00 PM Joshua Gun, PT [facility]  05/21/2024  5:00 PM Zenaida Reyes HERO ALMETA [facility]  06/11/2024  7:45 AM Buck Saucer, MD [facility] None  06/23/2024 10:00 AM Urbano Albright, MD [facility] CPR  02/25/2025  1:40 PM [facility]-ANNUAL WELLNESS VISIT [facility] [address]      This document was completed utilizing speech recognition software. Grammatical errors, random word insertions, pronoun errors, and incomplete sentences are an occasional consequence of this system due to software limitations, ambient noise, and hardware issues. Any formal questions or concerns about the content, text or information contained within the body of this dictation should be directly addressed to the provider for clarification.

## 2024-04-25 NOTE — Progress Notes (Signed)
 Nutrition Follow-up:  Pt stage II pancreatic cancer. She is receiving neoadjuvant gemcitabine /abraxane  (first 9/19). Patient is under the care of Dr. Autumn   Met with patient and daughter in infusion. Patient reports tolerating first treatment well with no NIS. Had mild fatigue, but she is grateful for not feeling sick. Appetite has been good. Eating smaller meals more often. Recalls sausage/egg on english muffin for breakfast. She is snacking on baked chips and cheese sandwich at visit. Patient reports taking creon  when she has diarrhea which is chronic s/p cholecystectomy. Reports increased water intake (4-6 bottles). Says this keeps her running to the bathroom.   Medications: klor-con , oxy-ir, creon   Labs: glucose 117  Anthropometrics: Wt 213 lb today - stable   9/19 - 213 lb   NUTRITION DIAGNOSIS: Food and nutrition related knowledge deficit improving   INTERVENTION:  Educated on role of pancreas in digestion and use of creon  to aid with altered function, educated to take first pill with first bite of food and second pill ~halfway through meal to receive maximum benefit - pancreatic enzyme handout provided  Continue 4-6 smaller meals with adequate calories and protein Continue good hydration practices     MONITORING, EVALUATION, GOAL: wt trends, intake    NEXT VISIT: To be scheduled as needed

## 2024-04-25 NOTE — Patient Instructions (Signed)
 [facility] - A DEPT OF [place]  Discharge Instructions: Thank you for choosing [facility] to provide your oncology and hematology care.   If you have a lab appointment with the Cancer Center, please go directly to the Cancer Center and check in at the registration area.   Wear comfortable clothing and clothing appropriate for easy access to any Portacath or PICC line.   We strive to give you quality time with your provider. You may need to reschedule your appointment if you arrive late (15 or more minutes).  Arriving late affects you and other patients whose appointments are after yours.  Also, if you miss three or more appointments without notifying the office, you may be dismissed from the clinic at the provider's discretion.      For prescription refill requests, have your pharmacy contact our office and allow 72 hours for refills to be completed.    Today you received the following chemotherapy and/or immunotherapy agents: Abraxane  (paclitaxel -protein bound), Gemzar  (gemcitabine )   To help prevent nausea and vomiting after your treatment, we encourage you to take your nausea medication as directed.  BELOW ARE SYMPTOMS THAT SHOULD BE REPORTED IMMEDIATELY: *FEVER GREATER THAN 100.4 F (38 C) OR HIGHER *CHILLS OR SWEATING *NAUSEA AND VOMITING THAT IS NOT CONTROLLED WITH YOUR NAUSEA MEDICATION *UNUSUAL SHORTNESS OF BREATH *UNUSUAL BRUISING OR BLEEDING *URINARY PROBLEMS (pain or burning when urinating, or frequent urination) *BOWEL PROBLEMS (unusual diarrhea, constipation, pain near the anus) TENDERNESS IN MOUTH AND THROAT WITH OR WITHOUT PRESENCE OF ULCERS (sore throat, sores in mouth, or a toothache) UNUSUAL RASH, SWELLING OR PAIN  UNUSUAL VAGINAL DISCHARGE OR ITCHING   Items with * indicate a potential emergency and should be followed up as soon as possible or go to the Emergency Department if any problems should occur.  Please show  the CHEMOTHERAPY ALERT CARD or IMMUNOTHERAPY ALERT CARD at check-in to the Emergency Department and triage nurse.  Should you have questions after your visit or need to cancel or reschedule your appointment, please contact [facility] - A DEPT OF [facility]  Dept: [phone]  and follow the prompts.  Office hours are 8:00 a.m. to 4:30 p.m. Monday - Friday. Please note that voicemails left after 4:00 p.m. may not be returned until the following business day.  We are closed weekends and major holidays. You have access to a nurse at all times for urgent questions. Please call the main number to the clinic Dept: [phone] and follow the prompts.   For any non-urgent questions, you may also contact your provider using MyChart. We now offer e-Visits for anyone 17 and older to request care online for non-urgent symptoms. For details visit [email or web address].   Also download the MyChart app! Go to the app store, search MyChart, open the app, select [place], and log in with your MyChart username and password.

## 2024-05-01 ENCOUNTER — Ambulatory Visit: Admitting: Physical Therapy

## 2024-05-01 ENCOUNTER — Telehealth: Payer: Self-pay

## 2024-05-01 NOTE — Telephone Encounter (Signed)
 Spoke via phone call to patient's daughter, [name], who reported that patient has had diarrhea since 10:00 am this morning. Education provided on side effects of Chemotherapy for Abraxane  and Gemcitabine  which includes diarrhea. Patient did just self administer anti-diarrhea medication and was instructed to take as needed and to call Dr. Clovis office if the diarrhea is not much improved by tomorrow late morning. Also advised to obtain some electrolyte fluids such as Pedialyte or Gatorade. [name] and patient agreed with this plan.

## 2024-05-05 NOTE — Therapy (Unsigned)
 OUTPATIENT PHYSICAL THERAPY TREATMENT NOTE   Patient Name: Gina Key MRN: [id number] DOB:10/01/1945, 78 y.o., female Today's Date: 05/07/2024  END OF SESSION:  PT End of Session - 05/07/24 1703     Visit Number 2    Number of Visits 8    Date for Recertification  06/17/24    Authorization Type [employer]    PT Start Time 1700    Activity Tolerance Patient limited by fatigue;Patient tolerated treatment well    Behavior During Therapy Anxious           Past Medical History:  Diagnosis Date   Acid reflux    Allergy    Anxiety    DDD (degenerative disc disease), lumbar    Depression    Facet joint disease    GERD (gastroesophageal reflux disease)    High cholesterol    Hypertension    Low vitamin D  level    OSA (obstructive sleep apnea)    Primary osteoarthritis involving multiple joints    Pulmonary embolism (HCC)    Seizures (HCC)    Sleep apnea    C PAP   Past Surgical History:  Procedure Laterality Date   ABDOMINAL HYSTERECTOMY     BREAST BIOPSY Right    benign   CHOLECYSTECTOMY     ESOPHAGOGASTRODUODENOSCOPY N/A 03/03/2024   Procedure: EGD (ESOPHAGOGASTRODUODENOSCOPY);  Surgeon: Wilhelmenia Aloha Raddle., MD;  Location: [facility];  Service: Gastroenterology;  Laterality: N/A;   EUS N/A 03/03/2024   Procedure: ULTRASOUND, UPPER GI TRACT, ENDOSCOPIC;  Surgeon: Wilhelmenia Aloha Raddle., MD;  [facility];  Service: Gastroenterology;  Laterality: N/A;   FINE NEEDLE ASPIRATION  03/03/2024   Procedure: FINE NEEDLE ASPIRATION;  Surgeon: Wilhelmenia Aloha Raddle., MD;  Location[facility];  Service: Gastroenterology;;   IR IMAGING GUIDED PORT INSERTION  03/31/2024   left and right rotator cuff     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   Aortic atherosclerosis  10/15/2023   Disc disease, degenerative, lumbar or lumbosacral 10/15/2023   Acute pulmonary embolism (HCC) 10/05/2023   Hypernatremia 10/05/2023   Dyspnea on exertion 10/05/2023   Osteopenia 12/14/2022   Essential hypertension 09/28/2021   Gastroesophageal reflux disease 09/28/2021   Seasonal allergies 09/28/2021   History of migraine 09/28/2021   Urinary incontinence 09/28/2021   Hyperlipidemia 09/28/2021   History of seizure 09/28/2021   OSA (obstructive sleep apnea) 09/28/2021    PCP: [name], MD   REFERRING PROVIDER: Aron Shoulders, MD   REFERRING DIAG: DFM related to pancreatic CA  THERAPY DIAG:  Physical deconditioning  Unsteadiness on feet  Rationale for Evaluation and Treatment: Rehabilitation  ONSET DATE: chronic  SUBJECTIVE:   SUBJECTIVE STATEMENT: Arrives to OPPT not feeling right  but denies N/V/C  PERTINENT HISTORY: None noted PAIN:  Are you having pain? No  PRECAUTIONS: active CA(pancreatic)  RED FLAGS: None   WEIGHT BEARING RESTRICTIONS: No  FALLS:  Has patient fallen in last 6 months? No    OCCUPATION: retired  PLOF: Independent  PATIENT GOALS: To improve my mobility  NEXT MD VISIT: TBD  OBJECTIVE:  Note: Objective measures were completed at Evaluation unless otherwise noted.  DIAGNOSTIC FINDINGS: none applicable to PT  PATIENT SURVEYS:  PSFS: THE PATIENT SPECIFIC FUNCTIONAL SCALE  Place score of 0-10 (0 = unable to perform activity and 10 =  able to perform activity at the same level as before injury or problem)  Activity Date: 04/17/24    Standing from a chair  9    2.  Standing 5 min 5    3.  Walking 5 min 5    4.      Total Score 19/30      Total Score = Sum of activity scores/number of activities  Minimally Detectable Change: 3 points (for single activity); 2 points (for average score)  [facility] (nd). The Patient Specific Functional Scale . Retrieved from  [email or web address]     MUSCLE LENGTH: N/T  PALPATION: Not tested  LOWER EXTREMITY ROM:  Active ROM Right eval Left eval  Hip flexion    Hip extension    Hip abduction    Hip adduction    Hip internal rotation    Hip external rotation    Knee flexion    Knee extension    Ankle dorsiflexion    Ankle plantarflexion    Ankle inversion    Ankle eversion     (Blank rows = not tested)  LOWER EXTREMITY MMT:  MMT Right eval Left eval  Hip flexion    Hip extension    Hip abduction    Hip adduction    Hip internal rotation    Hip external rotation    Knee flexion    Knee extension    Ankle dorsiflexion    Ankle plantarflexion    Ankle inversion    Ankle eversion     (Blank rows = not tested)   FUNCTIONAL TESTS:  30 seconds chair stand test 6 reps  2 MWT 169ft with cane   GAIT: Distance walked: 63ft x2 Assistive device utilized: Single point cane Level of assistance: Modified independence Comments: slow cadence                                                                                                                                TREATMENT: [facility] Adult PT Treatment:                                                DATE: 05/07/24 Therapeutic Exercise: Seated FAQs 15/15 Seated ham curls YTB 15/15 Standing heel raises 15x Standing heel rocks 15x Supine hip fallouts RTB 15x B, 15/15 Supine ball press 10x B, 10/10 STS from airex 10x   [facility] Adult PT Treatment:                                                DATE: 04/17/24 Eval and HEP Self Care: Additional minutes spent for educating on updated Therapeutic Home Exercise Program as well as comparing  current status to condition at start of symptoms. This included exercises focusing on stretching, strengthening, with focus on eccentric aspects. Long term goals include an improvement in range of motion, strength, endurance as well as avoiding reinjury.  Patient's frequency would include in 1-2 times a day, 3-5 times a week for a duration of 6-12 weeks. Proper technique shown and discussed handout in great detail. All questions were discussed and addressed.     PATIENT EDUCATION:  Education details: Discussed eval findings, rehab rationale and POC and patient is in agreement  Person educated: Patient Education method: Explanation and Handouts Education comprehension: verbalized understanding and needs further education  HOME EXERCISE PROGRAM: Access Code: [email or web address] URL: [email or web address] Date: 04/17/2024 Prepared by: Reyes Kohut  Exercises - Sit to Stand with Arms Crossed  - 2 x daily - 5 x weekly - 1 sets - 5 reps - Heel Toe Raises with Counter Support  - 2 x daily - 5 x weekly - 1 sets - 10 reps  ASSESSMENT:  CLINICAL IMPRESSION: First f/u session focused on there-ex to major muscle groupd in LE;' s Patient is a 78 y.o. female who was seen today for physical therapy evaluation and treatment for physical deconditioning due to ongoing CA care.  30s chair stand test shows LE strength deficits and 2 MWT shows endurance and mobility deficits.  Recommend OPPT 1w8 to accomdate chemo schedule with goal of improving strength, mobility and endurance as tolerated.  OBJECTIVE IMPAIRMENTS: Abnormal gait, cardiopulmonary status limiting activity, decreased activity tolerance, decreased endurance, decreased mobility, difficulty walking, decreased strength, and obesity.   ACTIVITY LIMITATIONS: carrying, lifting, standing, and stairs  PERSONAL FACTORS: Age, Fitness, and 1 comorbidity: active CA are also affecting patient's functional outcome.   REHAB POTENTIAL: Good  CLINICAL DECISION MAKING: Evolving/moderate complexity  EVALUATION COMPLEXITY: Low   GOALS: Goals reviewed with patient? No  SHORT TERM GOALS: Target date: 05/15/2024   Patient to demonstrate independence in HEP  Baseline: [email or web address] Goal status:  INITIAL   LONG TERM GOALS: Target date: 06/12/2024    Assess 2 MWT for gains in distance Baseline: 153ft Goal status: INITIAL  2.  Patient will increase 30s chair stand reps from 6 to 8 without arms to demonstrate and improved functional ability with less pain/difficulty as well as reduce fall risk.  Baseline: 6 Goal status: INITIAL  3.  Patient will score at least 25/30 on PSFS to signify clinically meaningful improvement in functional abilities.   Baseline: 19/30 Goal status: INITIAL     PLAN:  PT FREQUENCY: 1x/week  PT DURATION: 8 weeks  PLANNED INTERVENTIONS: 97110-Therapeutic exercises, 97530- Therapeutic activity, 97112- Neuromuscular re-education, 97535- Self Care, 02859- Manual therapy, [id number]- Gait training, Patient/Family education, Balance training, and Stair training  PLAN FOR NEXT SESSION: HEP review and update, manual techniques as appropriate, aerobic tasks, ROM and flexibility activities, strengthening and PREs, TPDN, gait and balance training as needed     Reyes CHRISTELLA Kohut, PT 05/07/2024, 5:32 PM

## 2024-05-07 ENCOUNTER — Ambulatory Visit: Attending: General Surgery

## 2024-05-07 DIAGNOSIS — R5381 Other malaise: Secondary | ICD-10-CM | POA: Insufficient documentation

## 2024-05-07 DIAGNOSIS — R2681 Unsteadiness on feet: Secondary | ICD-10-CM | POA: Diagnosis not present

## 2024-05-09 ENCOUNTER — Inpatient Hospital Stay

## 2024-05-09 ENCOUNTER — Inpatient Hospital Stay: Admitting: Oncology

## 2024-05-09 ENCOUNTER — Encounter: Payer: Self-pay | Admitting: Oncology

## 2024-05-09 ENCOUNTER — Other Ambulatory Visit: Payer: Self-pay

## 2024-05-09 VITALS — BP 108/58 | HR 67 | Temp 97.6°F | Resp 17 | Ht 62.0 in | Wt 215.0 lb

## 2024-05-09 DIAGNOSIS — C259 Malignant neoplasm of pancreas, unspecified: Secondary | ICD-10-CM

## 2024-05-09 DIAGNOSIS — Z5111 Encounter for antineoplastic chemotherapy: Secondary | ICD-10-CM | POA: Diagnosis not present

## 2024-05-09 LAB — CMP (CANCER CENTER ONLY)
ALT: 36 U/L (ref 0–44)
AST: 26 U/L (ref 15–41)
Albumin: 4.1 g/dL (ref 3.5–5.0)
Alkaline Phosphatase: 107 U/L (ref 38–126)
Anion gap: 2 — ABNORMAL LOW (ref 5–15)
BUN: 13 mg/dL (ref 8–23)
CO2: 26 mmol/L (ref 22–32)
Calcium: 9.5 mg/dL (ref 8.9–10.3)
Chloride: 115 mmol/L — ABNORMAL HIGH (ref 98–111)
Creatinine: 0.84 mg/dL (ref 0.44–1.00)
GFR, Estimated: >60 mL/min (ref >60)
Glucose, Bld: 106 mg/dL — ABNORMAL HIGH (ref 70–99)
Potassium: 3.9 mmol/L (ref 3.5–5.1)
Sodium: 143 mmol/L (ref 135–145)
Total Bilirubin: 0.3 mg/dL (ref 0.0–1.2)
Total Protein: 6.8 g/dL (ref 6.5–8.1)

## 2024-05-09 LAB — CBC WITH DIFFERENTIAL (CANCER CENTER ONLY)
Abs Immature Granulocytes: 0.01 K/uL (ref 0.00–0.07)
Basophils Absolute: 0 K/uL (ref 0.0–0.1)
Basophils Relative: 1 %
Eosinophils Absolute: 0.1 K/uL (ref 0.0–0.5)
Eosinophils Relative: 2 %
HCT: 33.2 % — ABNORMAL LOW (ref 36.0–46.0)
Hemoglobin: 10.1 g/dL — ABNORMAL LOW (ref 12.0–15.0)
Immature Granulocytes: 0 %
Lymphocytes Relative: 37 %
Lymphs Abs: 2 K/uL (ref 0.7–4.0)
MCH: 26.4 pg (ref 26.0–34.0)
MCHC: 30.4 g/dL (ref 30.0–36.0)
MCV: 86.9 fL (ref 80.0–100.0)
Monocytes Absolute: 0.6 K/uL (ref 0.1–1.0)
Monocytes Relative: 10 %
Neutro Abs: 2.7 K/uL (ref 1.7–7.7)
Neutrophils Relative %: 50 %
Platelet Count: 159 K/uL (ref 150–400)
RBC: 3.82 MIL/uL — ABNORMAL LOW (ref 3.87–5.11)
RDW: 13.6 % (ref 11.5–15.5)
WBC Count: 5.4 K/uL (ref 4.0–10.5)
nRBC: 0 % (ref 0.0–0.2)

## 2024-05-09 MED ORDER — PACLITAXEL PROTEIN-BOUND CHEMO INJECTION 100 MG
125.0000 mg/m2 | Freq: Once | INTRAVENOUS | Status: AC
  Administered 2024-05-09: 250 mg via INTRAVENOUS
  Filled 2024-05-09: qty 50

## 2024-05-09 MED ORDER — SODIUM CHLORIDE 0.9 % IV SOLN
1000.0000 mg/m2 | Freq: Once | INTRAVENOUS | Status: AC
  Administered 2024-05-09: 2052 mg via INTRAVENOUS
  Filled 2024-05-09: qty 53.97

## 2024-05-09 MED ORDER — SODIUM CHLORIDE 0.9 % IV SOLN: INTRAVENOUS | Status: DC

## 2024-05-09 MED ORDER — PROCHLORPERAZINE MALEATE 10 MG PO TABS
10.0000 mg | ORAL_TABLET | Freq: Once | ORAL | Status: AC
  Administered 2024-05-09: 10 mg via ORAL
  Filled 2024-05-09: qty 1

## 2024-05-09 NOTE — Assessment & Plan Note (Addendum)
 She was seen in our rapid diagnostic clinic on 02/12/2024 for consultation and was referred to GI for EUS/biopsy.  Labs on that day showed a CA 19-9 of 8, CEA undetectable, chromogranin A increased at 224.8 ng/mL.  CBCD and CMP were unremarkable with normal LFTs.  On 03/03/2024 Dr. Wilhelmenia performed EGD and EUS.  On EUS, mass was identified in the pancreatic body.  Endosonographic appearance was highly suspicious for adenocarcinoma.  Staged as T1c, N0, MX.  FNA performed.  There was no sign of significant pathology in the CBD and in the common hepatic duct.  No malignant appearing lymph nodes were visualized in the celiac region, peripancreatic region and porta hepatis.  A cyst was found in the visualized portion of the liver measured 26 mm x 23 mm.  No evidence of any solid mass lesions in the left lobe of liver.  FNA from pancreatic mass came back positive for adenocarcinoma.  On 03/11/2024, staging PET scan showed ill-defined heterogeneous low-level hypermetabolic in the mid pancreas, corresponding to the abnormality on previous MRI.  Features compatible with biopsy-proven pancreatic adenocarcinoma.  16 x 11 mm focus of amorphous soft tissue in the gastrohepatic ligament was hypermetabolic and concerning for metastatic disease.  No evidence of hypermetabolic liver metastasis.  No evidence of metastatic disease elsewhere.  Her case was discussed in GI tumor conference on 03/19/2024. Uncertainty remains regarding the second spot in the gastrohepatic ligament, which may be post-biopsy inflammation rather than cancer.   Genetic testing came back negative for any deleterious germline mutations.  Patient had consultation with Dr. 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  The location of this does appear to be relatively central.  It is unclear to me whether this is proximal enough in the body that would require a Whipple or a distal pancreas.  It is not well-seen on imaging.  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 We will start with neoadjuvant chemotherapy.  I will then get repeat imaging.  I do want a pancreatic protocol follow-up scan.   Plan for neoadjuvant chemotherapy with gemcitabine  and Abraxane , dose reduced for her age and comorbidities, followed by surgical evaluation.  Initially will plan for chemotherapy on days 1, 15 of 28-day cycle and if well-tolerated, we will attempt to do chemo on days 1, 8, 15 of 28-day cycle.  We started with 20% dose reduction of gemcitabine  and Abraxane .   Tolerated cycle 1 of chemotherapy reasonably well without major side effects.  Had mild nausea for couple of days after chemotherapy but no other major issues.  Labs reveal no dose-limiting toxicities today.  Will proceed with cycle 2 day 1 of gemcitabine  and Abraxane  today at standard strength.  Plan to continue chemotherapy at standard strength as long as she tolerates it.  We will even consider increasing the frequency of chemotherapy to days 1, 8, 15 of 28-day cycle from cycle 3 onwards, if well-tolerated.  Plan to continue this regimen for at least three to four months before reassessing with a scan.  RTC in 2 weeks for cycle 2-day 15 of chemotherapy.   Since I will be out of office in 2 weeks, she will be seen by one of our other providers on her return visit.

## 2024-05-09 NOTE — Progress Notes (Signed)
 I reviewed patient visit with social work Tax inspector. I concur with the treatment plan as documented in the SW intern's note.   Analis Distler E Siaosi Alter, LCSW Clinical Child psychotherapist

## 2024-05-09 NOTE — Progress Notes (Signed)
 [facility] CSW Progress Note  Clinical Social Work Intern met with patient and her daughter in infusion suite to follow-up on emotional support. Pt reports she has been feeling well since last treatment, and spoke of her faith as a strong protective factor in overcoming challenges past and present. Pt looks forward to attending her home church this week after a period of absence, and states she finds purpose in always encouraging others no matter her own situation.    Interventions: Provided brief mental health counseling with regard to coping strategies for dealing with fear of negative health outcomes, and affirming pt's employment of gratitude practice and finding meaning in helping others.       Follow Up Plan:  Patient will contact CSW with any support or resource needs    Thersia KATHEE Daring Clinical Social Work Intern [facility]

## 2024-05-09 NOTE — Progress Notes (Signed)
 [facility]  ONCOLOGY CLINIC PROGRESS NOTE   Patient Care Team: Gina Clotilda SAUNDERS, MD as PCP - General (Family Medicine) Key, Gina Raddle., MD as Consulting Physician (Gastroenterology)  PATIENT NAME: Gina Key   MR#: [id number] DOB: [birth year]  Date of visit: 05/09/2024   ASSESSMENT & PLAN:   Gina Key is a 78 y.o.  lady with a past medical history of hypertension, history of pulmonary embolism in March 2025, seizure disorder, obstructive sleep apnea, arthritis, GERD, was referred to our clinic in August 2025 for pancreatic adenocarcinoma.   Pancreatic adenocarcinoma [facility]) She was seen in our rapid diagnostic clinic on 02/12/2024 for consultation and was referred to GI for EUS/biopsy.  Labs on that day showed a CA 19-9 of 8, CEA undetectable, chromogranin A increased at 224.8 ng/mL.  CBCD and CMP were unremarkable with normal LFTs.  On 03/03/2024 Dr. Wilhelmenia performed EGD and EUS.  On EUS, mass was identified in the pancreatic body.  Endosonographic appearance was highly suspicious for adenocarcinoma.  Staged as T1c, N0, MX.  FNA performed.  There was no sign of significant pathology in the CBD and in the common hepatic duct.  No malignant appearing lymph nodes were visualized in the celiac region, peripancreatic region and porta hepatis.  A cyst was found in the visualized portion of the liver measured 26 mm x 23 mm.  No evidence of any solid mass lesions in the left lobe of liver.  FNA from pancreatic mass came back positive for adenocarcinoma.  On 03/11/2024, staging PET scan showed ill-defined heterogeneous low-level hypermetabolic in the mid pancreas, corresponding to the abnormality on previous MRI.  Features compatible with biopsy-proven pancreatic adenocarcinoma.  16 x 11 mm focus of amorphous soft tissue in the gastrohepatic ligament was hypermetabolic and concerning for metastatic disease.  No evidence of hypermetabolic liver metastasis.   No evidence of metastatic disease elsewhere.  Her case was discussed in GI tumor conference on 03/19/2024. Uncertainty remains regarding the second spot in the gastrohepatic ligament, which may be post-biopsy inflammation rather than cancer.   Genetic testing came back negative for any deleterious germline mutations.  Patient had consultation with Dr. 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  The location of this does appear to be relatively central.  It is unclear to me whether this is proximal enough in the body that would require a Whipple or a distal pancreas.  It is not well-seen on imaging.  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 We will start with neoadjuvant chemotherapy.  I will then get repeat imaging.  I do want a pancreatic protocol follow-up scan.   Plan for neoadjuvant chemotherapy with gemcitabine  and Abraxane , dose reduced for her age and comorbidities, followed by surgical evaluation.  Initially will plan for chemotherapy on days 1, 15 of 28-day cycle and if well-tolerated, we will attempt to do chemo on days 1, 8, 15 of 28-day cycle.  We started with 20% dose reduction of gemcitabine  and Abraxane .   Tolerated cycle 1 of chemotherapy reasonably well without major side effects.  Had mild nausea for couple of days after chemotherapy but no other major issues.  Labs reveal no dose-limiting toxicities today.  Will proceed with cycle 2 day 1 of gemcitabine  and Abraxane  today at standard strength.  Plan to continue chemotherapy at standard strength as long as she tolerates it.  We will even consider increasing  the frequency of chemotherapy to days 1, 8, 15 of 28-day cycle from cycle 3 onwards, if well-tolerated.  Plan to continue this regimen for at least three to four months before reassessing with a scan.  RTC in 2 weeks for cycle 2-day 15 of chemotherapy.   Since I will be out of office in 2  weeks, she will be seen by one of our other providers on her return visit.  Shoulder pain Scheduled to receive an injection in the shoulder for pain management. Injection planned within the joint. 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 Blood counts are well-managed. - Continue Eliquis  for anticoagulation. - Monitor blood counts regularly during chemotherapy.  Suspected post-biopsy inflammation in gastrohepatic ligament Suspected post-biopsy inflammation in the gastrohepatic ligament, with uncertainty about whether it represents cancer. PET scan showed activity, but MRI and endoscopic ultrasound did not confirm cancer. Radiologist suggests it may be inflammation rather than cancer. - Monitor for signs of pancreatitis with additional labs, including lipase levels as indicated.  I reviewed lab results and outside records for this visit and discussed relevant results with the patient. Diagnosis, plan of care and treatment options were also discussed in detail with the patient. Opportunity provided to ask questions and answers provided to her apparent satisfaction. Provided instructions to call our clinic with any problems, questions or concerns prior to return visit. I recommended to continue follow-up with PCP and sub-specialists. She verbalized understanding and agreed with the plan.   NCCN guidelines have been consulted in the planning of this patient's care.  I spent a total of 30 minutes during this encounter with the patient including review of chart and various tests results, discussions about plan of care and coordination of care plan.   Gina Patten, MD  05/09/2024 2:11 PM  [facility] - A DEPT OF [facility] [address] [place] [address] Dept: [phone] Dept Fax: [phone]     CHIEF COMPLAINT/ REASON FOR VISIT:   Adenocarcinoma of pancreas, Stage I vs stage II  Current Treatment: Plan for neoadjuvant chemotherapy with dose reduce gemcitabine  and Abraxane , followed by surgical evaluation.  Started systemic treatment with gemcitabine  and Abraxane  from 04/11/2024.  INTERVAL HISTORY:    Discussed the use of AI scribe software for clinical note transcription with the patient, who gave verbal consent to proceed.  History of Present Illness Gina Key is a 78 year old female who presents for follow-up of her cancer treatment.  She is undergoing full-strength chemotherapy every two weeks and tolerates the treatment well. She experiences cramping but no nausea or vomiting. Her blood counts, including white count, platelets, and hemoglobin, are stable, with hemoglobin at 10.1. Kidney and liver function tests, as well as electrolytes, are within normal limits.  She experiences gas pain, which she manages with Fasal as needed. The pain is not severe and does not occur daily. No other pain is reported except for the gas pain.  She reports taking Eliquis  as prescribed, with no reported issues.  She reports that she is supposed to get an injection in her shoulder for pain.   I have reviewed the past medical history, past surgical history, social history and family history with the patient and they are unchanged from previous note.  HISTORY OF PRESENT ILLNESS:   ONCOLOGY HISTORY:   On review of the previous records  patient was evaluated in the emergency department on 02/05/2024 for gross hematuria.  CT abdomen pelvis was performed which showed moderate dilation of the pancreatic duct in the mid body and tail overlying and parenchymal atrophy. MR abdomen was ordered by PCP Dr. Mercer and performed on 02/07/24. MRI showed a focal segment of pancreatic ductal dilatation with abrupt caliber change and stricture. Subtle mass lesion in this location identified  measuring 17 x 15 mm at the level of the caliber change and has appearance worrisome for neoplasm such as an adenocarcinoma. Image did not show any other areas concerning for malignant disease.    CMP collected 02/05/24 in the ED was overall unremarkable. Further chart review shows patient was diagnosed with a submassive PE with right heart strain 10/05/23 and is currently taking Eliquis .     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 She does not smoke or drink alcohol. She currently lives with her daughter due to past seizures. Her last colonoscopy in February 2024 was normal, as was her last mammogram. Her family history is significant for her mother having pancreatic cancer at age 77, her brother having throat cancer, and her daughter having kidney cancer.   She was seen in our rapid diagnostic clinic on 02/12/2024 for consultation and was referred to GI for EUS/biopsy.  Labs on that day showed a CA 19-9 of 8, CEA undetectable, chromogranin A increased at 224.8 ng/mL.  CBCD and CMP were unremarkable with normal LFTs.   On 03/03/2024 Dr. Wilhelmenia performed EGD and EUS.  On EUS, mass was identified in the pancreatic body.  Endosonographic appearance was highly suspicious for adenocarcinoma.  Staged as T1c, N0, MX.  FNA performed.  There was no sign of significant pathology in the CBD and in the common hepatic duct.  No malignant appearing lymph nodes were visualized in the celiac region, peripancreatic region and porta hepatis.  A cyst was found in the visualized portion of the liver measured 26 mm x 23 mm.  No evidence of any solid mass lesions in the left lobe of liver.   FNA from pancreatic mass came back positive for adenocarcinoma.   On 03/11/2024, staging PET scan showed ill-defined heterogeneous low-level hypermetabolic in the mid pancreas, corresponding to the abnormality on previous MRI.  Features compatible with biopsy-proven pancreatic adenocarcinoma.  16 x 11 mm focus of amorphous  soft tissue in the gastrohepatic ligament was hypermetabolic and concerning for metastatic disease.  No evidence of hypermetabolic liver metastasis.  No evidence of metastatic disease elsewhere.   Her case was discussed in GI tumor conference on 03/19/2024. Uncertainty remains regarding the second spot in the gastrohepatic ligament, which may be post-biopsy inflammation rather than cancer.   Genetic testing came back negative for any deleterious germline mutations.  Patient had consultation with Dr. 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  The location of this does appear to be relatively central.  It is unclear to me whether this is proximal enough in the body that would require a Whipple or a distal pancreas.  It is not well-seen on imaging.  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 We will start with neoadjuvant chemotherapy.  I will then get repeat imaging.  I do want a pancreatic protocol follow-up scan.   Plan for neoadjuvant chemotherapy with gemcitabine  and Abraxane , dose reduced for her age and comorbidities, followed by surgical evaluation.  Started systemic treatment with  gemcitabine  and Abraxane  from 04/11/2024.  Oncology History  Pancreatic adenocarcinoma (HCC)  03/03/2024 Initial Diagnosis   Pancreatic adenocarcinoma (HCC)   03/21/2024 Cancer Staging   Staging form: Exocrine Pancreas, AJCC 8th Edition - Clinical: Stage IIB (cT1c, cN1, cM0) - Signed by Autumn Millman, MD on 04/11/2024   03/31/2024 Genetic Testing   Negative genetic testing. No pathogenic variants identified on the [facility] CancerNext-Expanded+RNA Panel. The report date is 03/31/2024.   The CancerNext-Expanded gene panel offered by [facility] and includes sequencing, rearrangement, and RNA analysis for the following 77 genes: AIP, ALK, APC, ATM, AXIN2, BAP1, BARD1, BMPR1A, BRCA1, BRCA2, BRIP1, CDC73, CDH1, CDK4, CDKN1B, CDKN2A, CEBPA,  CHEK2, CTNNA1, DDX41, DICER1, ETV6, FH, FLCN, GATA2, LZTR1, MAX, MBD4, MEN1, MET, MLH1, MSH2, MSH3, MSH6, MUTYH, NF1, NF2, NTHL1, PALB2, PHOX2B, PMS2, POT1, PRKAR1A, PTCH1, PTEN, RAD51C, RAD51D, RB1, RET, RPS20, RUNX1, SDHA, SDHAF2, SDHB, SDHC, SDHD, SMAD4, SMARCA4, SMARCB1, SMARCE1, STK11, SUFU, TMEM127, TP53, TSC1, TSC2, VHL, and WT1 (sequencing and deletion/duplication); EGFR, HOXB13, KIT, MITF, PDGFRA, POLD1, and POLE (sequencing only); EPCAM and GREM1 (deletion/duplication only).    04/11/2024 -  Chemotherapy   Patient is on Treatment Plan : PANCREATIC Abraxane  D1,15 + Gemcitabine  D1,15 q28d         REVIEW OF SYSTEMS:   Review of Systems - Oncology  All other pertinent systems were reviewed with the patient and are negative.  ALLERGIES: She has no known allergies.  MEDICATIONS:  Current Outpatient Medications  Medication Sig Dispense Refill   atorvastatin  (LIPITOR) 40 MG tablet      ELIQUIS  5 MG TABS tablet TAKE 1 TABLET BY MOUTH TWICE A DAY 180 tablet 1   ferrous sulfate 325 (65 FE) MG tablet Take 325 mg by mouth at bedtime.     Fexofenadine HCl (ALLEGRA PO) Take by mouth.     Incontinence Supply Disposable (WINGS HL ADULT BRIEFS/XL) MISC As needed daily. 100 each 11   ipratropium (ATROVENT ) 0.06 % nasal spray PLACE 2 SPRAYS INTO BOTH NOSTRILS 4 TIMES DAILY. 45 mL 1   levETIRAcetam  (KEPPRA ) 500 MG tablet Take 1 tablet (500 mg total) by mouth 2 (two) times daily. 180 tablet 3   lidocaine  (HM LIDOCAINE  PATCH) 4 % Place 1 patch onto the skin daily as needed.     lidocaine -prilocaine  (EMLA ) cream Apply to affected area once 30 g 3   lipase/protease/amylase (CREON ) 36000 UNITS CPEP capsule Take 2 capsules (72,000 Units total) by mouth 3 (three) times daily with meals. May also take 1 capsule (36,000 Units total) as needed (with snacks - up to 4 snacks daily). 300 capsule 11   losartan  (COZAAR ) 100 MG tablet TAKE 1 TABLET BY MOUTH EVERY DAY 90 tablet 1   metoprolol  succinate (TOPROL -XL)  25 MG 24 hr tablet TAKE 1 TABLET (25 MG TOTAL) BY MOUTH DAILY. 90 tablet 1   montelukast  (SINGULAIR ) 10 MG tablet TAKE 1 TABLET BY MOUTH EVERYDAY AT BEDTIME 90 tablet 1   omeprazole (PRILOSEC) 20 MG capsule TAKE 1 CAPSULE EVERY DAY 90 capsule 1   ondansetron  (ZOFRAN ) 8 MG tablet Take 1 tablet (8 mg total) by mouth every 8 (eight) hours as needed for nausea or vomiting. 30 tablet 1   oxycodone  (OXY-IR) 5 MG capsule Take 1-2 capsules (5-10 mg total) by mouth 2 (two) times daily. 90 capsule 0   potassium chloride  (KLOR-CON ) 10 MEQ tablet TAKE 1 TABLET BY MOUTH EVERY DAY 90 tablet 0   prochlorperazine  (COMPAZINE ) 10 MG tablet Take 1 tablet (10 mg total) by  mouth every 6 (six) hours as needed for nausea or vomiting. 30 tablet 1   rosuvastatin  (CRESTOR ) 20 MG tablet Take 1 tablet (20 mg total) by mouth daily. 90 tablet 3   simethicone (MYLICON) 125 MG chewable tablet Chew 125 mg by mouth every 6 (six) hours as needed for flatulence.     spironolactone  (ALDACTONE ) 25 MG tablet TAKE 1 TABLET (25 MG TOTAL) BY MOUTH DAILY. 90 tablet 1   topiramate  (TOPAMAX ) 100 MG tablet TAKE 1 TABLET BY MOUTH TWICE A DAY 180 tablet 1   Current Facility-Administered Medications  Medication Dose Route Frequency Provider Last Rate Last Admin   lidocaine  (XYLOCAINE ) 1 % (with pres) injection 1 mL  1 mL Other Once        lidocaine  (XYLOCAINE ) 1 % (with pres) injection 4 mL  4 mL Other Once        triamcinolone  acetonide (KENALOG -40) injection 40 mg  40 mg Intramuscular Once        triamcinolone  acetonide (KENALOG -40) injection 40 mg  40 mg Intra-articular Once        Facility-Administered Medications Ordered in Other Visits  Medication Dose Route Frequency Provider Last Rate Last Admin   0.9 %  sodium chloride  infusion   Intravenous Continuous Meisha Salone, MD   Stopped at 05/09/24 1218     VITALS:   Blood pressure (!) 108/58, pulse 67, temperature 97.6 F (36.4 C), temperature source Temporal, resp. rate 17, height  5' 2 (1.575 m), weight 215 lb (97.5 kg), SpO2 100%.  Wt Readings from Last 3 Encounters:  05/09/24 215 lb (97.5 kg)  04/25/24 213 lb (96.6 kg)  04/21/24 212 lb (96.2 kg)    Body mass index is 39.32 kg/m.    Onc Performance Status - 05/09/24 0920       ECOG Perf Status   ECOG Perf Status Capable of only limited selfcare, confined to bed or chair more than 50% of waking hours      KPS SCALE   KPS % SCORE Cares for self, unable to carry on normal activity or to do active work            PHYSICAL EXAM:   Physical Exam Constitutional:      General: She is not in acute distress.    Appearance: Normal appearance.  HENT:     Head: Normocephalic and atraumatic.  Cardiovascular:     Rate and Rhythm: Normal rate.  Pulmonary:     Effort: Pulmonary effort is normal. No respiratory distress.  Abdominal:     General: There is no distension.  Neurological:     General: No focal deficit present.     Mental Status: She is alert and oriented to person, place, and time.  Psychiatric:        Mood and Affect: Mood normal.        Behavior: Behavior normal.       LABORATORY DATA:   I have reviewed the data as listed.  Results for orders placed or performed in visit on 05/09/24  CMP ([facility] only)  Result Value Ref Range   Sodium 143 135 - 145 mmol/L   Potassium 3.9 3.5 - 5.1 mmol/L   Chloride 115 (H) 98 - 111 mmol/L   CO2 26 22 - 32 mmol/L   Glucose, Bld 106 (H) 70 - 99 mg/dL   BUN 13 8 - 23 mg/dL   Creatinine 9.15 9.55 - 1.00 mg/dL   Calcium  9.5 8.9 - 10.3 mg/dL  Total Protein 6.8 6.5 - 8.1 g/dL   Albumin 4.1 3.5 - 5.0 g/dL   AST 26 15 - 41 U/L   ALT 36 0 - 44 U/L   Alkaline Phosphatase 107 38 - 126 U/L   Total Bilirubin 0.3 0.0 - 1.2 mg/dL   GFR, Estimated >39 >39 mL/min   Anion gap 2 (L) 5 - 15  CBC with Differential ([facility] Only)  Result Value Ref Range   WBC Count 5.4 4.0 - 10.5 K/uL   RBC 3.82 (L) 3.87 - 5.11 MIL/uL   Hemoglobin 10.1 (L) 12.0 -  15.0 g/dL   HCT 66.7 (L) 63.9 - 53.9 %   MCV 86.9 80.0 - 100.0 fL   MCH 26.4 26.0 - 34.0 pg   MCHC 30.4 30.0 - 36.0 g/dL   RDW 86.3 88.4 - 84.4 %   Platelet Count 159 150 - 400 K/uL   nRBC 0.0 0.0 - 0.2 %   Neutrophils Relative % 50 %   Neutro Abs 2.7 1.7 - 7.7 K/uL   Lymphocytes Relative 37 %   Lymphs Abs 2.0 0.7 - 4.0 K/uL   Monocytes Relative 10 %   Monocytes Absolute 0.6 0.1 - 1.0 K/uL   Eosinophils Relative 2 %   Eosinophils Absolute 0.1 0.0 - 0.5 K/uL   Basophils Relative 1 %   Basophils Absolute 0.0 0.0 - 0.1 K/uL   Immature Granulocytes 0 %   Abs Immature Granulocytes 0.01 0.00 - 0.07 K/uL      RADIOGRAPHIC STUDIES:  Previous imaging studies, including staging studies reviewed.   CODE STATUS:  Code Status History     Date Active Date Inactive Code Status Order ID Comments User Context   10/05/2023 0213 10/06/2023 1942 Full Code [id number]  Lee Kingfisher, MD ED    Questions for Most Recent Historical Code Status (Order [id number])     Question Answer   By: Consent: discussion documented in EHR            No orders of the defined types were placed in this encounter.    Future Appointments  Date Time Provider Department [facility]  05/12/2024  1:40 PM Urbano Albright, MD [facility] CPR  05/14/2024  5:00 PM Joshua Gun, PT [facility]  05/21/2024  5:00 PM Zenaida Reyes CHRISTELLA ALMETA [facility]  05/23/2024 12:15 PM [facility] FLUSH [facility] None  05/23/2024  1:00 PM Hanford Moats E, NP [facility] None  05/23/2024  2:15 PM [facility] INFUSION [facility] None  06/06/2024  9:00 AM [facility] FLUSH [facility] None  06/06/2024  9:30 AM Nikyah Lackman, MD [facility] None  06/06/2024 10:00 AM [facility] INFUSION [facility] None  06/11/2024  7:45 AM Buck Saucer, MD [facility] None  06/13/2024  9:15 AM [facility] FLUSH [facility] None  06/13/2024  9:45 AM Mabelle Mungin, Chinita, MD [facility] None  06/13/2024 10:30 AM [facility] INFUSION [facility] None   02/25/2025  1:40 PM [facility]-ANNUAL WELLNESS VISIT [facility] [address]      This document was completed utilizing speech recognition software. Grammatical errors, random word insertions, pronoun errors, and incomplete sentences are an occasional consequence of this system due to software limitations, ambient noise, and hardware issues. Any formal questions or concerns about the content, text or information contained within the body of this dictation should be directly addressed to the provider for clarification.

## 2024-05-09 NOTE — Patient Instructions (Signed)
 [facility] - A DEPT OF [place]  Discharge Instructions: Thank you for choosing [facility] to provide your oncology and hematology care.   If you have a lab appointment with the Cancer Center, please go directly to the Cancer Center and check in at the registration area.   Wear comfortable clothing and clothing appropriate for easy access to any Portacath or PICC line.   We strive to give you quality time with your provider. You may need to reschedule your appointment if you arrive late (15 or more minutes).  Arriving late affects you and other patients whose appointments are after yours.  Also, if you miss three or more appointments without notifying the office, you may be dismissed from the clinic at the provider's discretion.      For prescription refill requests, have your pharmacy contact our office and allow 72 hours for refills to be completed.    Today you received the following chemotherapy and/or immunotherapy agents: Abraxane  (paclitaxel -protein bound), Gemzar  (gemcitabine )   To help prevent nausea and vomiting after your treatment, we encourage you to take your nausea medication as directed.  BELOW ARE SYMPTOMS THAT SHOULD BE REPORTED IMMEDIATELY: *FEVER GREATER THAN 100.4 F (38 C) OR HIGHER *CHILLS OR SWEATING *NAUSEA AND VOMITING THAT IS NOT CONTROLLED WITH YOUR NAUSEA MEDICATION *UNUSUAL SHORTNESS OF BREATH *UNUSUAL BRUISING OR BLEEDING *URINARY PROBLEMS (pain or burning when urinating, or frequent urination) *BOWEL PROBLEMS (unusual diarrhea, constipation, pain near the anus) TENDERNESS IN MOUTH AND THROAT WITH OR WITHOUT PRESENCE OF ULCERS (sore throat, sores in mouth, or a toothache) UNUSUAL RASH, SWELLING OR PAIN  UNUSUAL VAGINAL DISCHARGE OR ITCHING   Items with * indicate a potential emergency and should be followed up as soon as possible or go to the Emergency Department if any problems should occur.  Please show  the CHEMOTHERAPY ALERT CARD or IMMUNOTHERAPY ALERT CARD at check-in to the Emergency Department and triage nurse.  Should you have questions after your visit or need to cancel or reschedule your appointment, please contact [facility] - A DEPT OF [facility]  Dept: [phone]  and follow the prompts.  Office hours are 8:00 a.m. to 4:30 p.m. Monday - Friday. Please note that voicemails left after 4:00 p.m. may not be returned until the following business day.  We are closed weekends and major holidays. You have access to a nurse at all times for urgent questions. Please call the main number to the clinic Dept: [phone] and follow the prompts.   For any non-urgent questions, you may also contact your provider using MyChart. We now offer e-Visits for anyone 17 and older to request care online for non-urgent symptoms. For details visit [email or web address].   Also download the MyChart app! Go to the app store, search MyChart, open the app, select [place], and log in with your MyChart username and password.

## 2024-05-12 ENCOUNTER — Encounter: Attending: Physical Medicine & Rehabilitation | Admitting: Physical Medicine & Rehabilitation

## 2024-05-12 ENCOUNTER — Encounter: Payer: Self-pay | Admitting: Physical Medicine & Rehabilitation

## 2024-05-12 VITALS — BP 126/79 | HR 87 | Ht 62.0 in | Wt 212.6 lb

## 2024-05-12 DIAGNOSIS — Z5181 Encounter for therapeutic drug level monitoring: Secondary | ICD-10-CM | POA: Diagnosis not present

## 2024-05-12 DIAGNOSIS — G894 Chronic pain syndrome: Secondary | ICD-10-CM | POA: Insufficient documentation

## 2024-05-12 DIAGNOSIS — Z79899 Other long term (current) drug therapy: Secondary | ICD-10-CM | POA: Insufficient documentation

## 2024-05-12 DIAGNOSIS — M19011 Primary osteoarthritis, right shoulder: Secondary | ICD-10-CM | POA: Diagnosis not present

## 2024-05-12 MED ORDER — BETAMETHASONE SOD PHOS & ACET 6 (3-3) MG/ML IJ SUSP
6.0000 mg | Freq: Once | INTRAMUSCULAR | Status: AC
  Administered 2024-05-12: 6 mg via INTRA_ARTICULAR

## 2024-05-12 MED ORDER — LIDOCAINE HCL 1 % IJ SOLN
4.0000 mL | Freq: Once | INTRAMUSCULAR | Status: AC
  Administered 2024-05-12: 4 mL

## 2024-05-12 NOTE — Progress Notes (Signed)
 Subjective:    Patient ID: Gina Key, female    DOB: [birth year], 78 y.o.   MRN: [id number]  HPI    Gina Key is a 78 y.o. year old female  who  has a past medical history of Acid reflux, Allergy, Anxiety, DDD (degenerative disc disease), lumbar, Depression, Facet joint disease, GERD (gastroesophageal reflux disease), High cholesterol, Hypertension, Low vitamin D  level, OSA (obstructive sleep apnea), Primary osteoarthritis involving multiple joints, Seizures (HCC), and Sleep apnea.   They are presenting to PM&R clinic as a new patient for pain management evaluation. They were referred by [name], PA for treatment of chronic pain.  Patient reports her primary pain is in her right shoulder.  Pain is worse with activity and use of her shoulder.  She cannot lie on her right side due to this pain.  Pain is sharp, aching, stabbing and overall constant.   She was seen by Dr. [name] on 04/06/2023 and had a ultrasound-guided shoulder injection with methylprednisolone .  She reports this did help her pain for short time but pain has returned.  Patient was not interested in shoulder surgery or other joint surgery at this time.   She also has some pain in her right hip and left knee.  She feels like this is due to her altered gait.  She uses a cane and walker for ambulation.     She reports poor sleep at night.  Denies depression.   She is previously followed by [facility] for pain management and was treated with buprenorphine 10 mcg every 7 days and Oxycodone  10mg  as needed.   It appears by PDMP review that buprenorphine was discontinued and she was continued on Oxycodone  10mg  by [name] until Feb 2023.  Patient reports she moved to [facility] at this time and had not established with a different pain clinic.  She reports the oxycodone  was very helpful for her pain and allowed her to be much more active and use her arm for activities.  She denies any side effects  with the medication.     Red flag symptoms: No red flags for back pain endorsed in Hx or ROS   Medications tried: lidocaine  patch help  Topical medications  Nsaids Advil doesn't work Tylenol    didn't help  Opiates  Hydrocodone- didn't help much  Tramadol- stopped helping  Gabapentin / Lyrica  Denies  TCAs  Denies  SNRIs- Denies      Other treatments: PT- Did not help  TENs unit- Denies  Injections- Cortisone injections- dont help  Surgery   Rotator cuff sugery many years ago     Interval history 06/11/2023 Patient is here for follow-up regarding her chronic right shoulder, right hip and bilateral knee pain.  She report she continues to have severe pain in her right shoulder worsened with any kind of movement.  Today she feels that her right knee has been causing her more pain than her left knee.  She does not recall her right hip being a significant source of pain recently, however she has a family member with her who reports that patient continues to complain of right hip pain worsened with ambulation.  She has tried duloxetine  but has not noticed a significant change in her pain levels.    Interval history 07/23/23 Patient left knee pain is greater than she had constant severe pain in her right shoulder.  She also has a pain in her right hip and both knees.  Today she reports left knee pain has been worse than right knee pain.  Patient has tried oxycodone  5 mg, this provides mild benefit but has not been strong enough to keep her pain controlled.  She has occasionally had constipation- chronic issue, had recent bowel movement after use of laxative.  She does not use regular stool softeners.  Interval history 09/03/23 Gina Key is here for a left knee injection.  She continues to have pain in her right shoulder, left knee greater than right knee, right hip.  She reports poor benefit with oxycodone  5 to 7.5 mg dose.  She tries to use the medication sparingly.  She says pain was much  more tolerable when she used 10 mg.  Denies any side effects with medication.   Interval history 12/14/23 Gina Key is here for right shoulder injection.  She continues to have right shoulder pain with ROM.  She reports her left knee is doing better since injection was completed last visit.  She uses oxycodone  as needed when pain is particularly severe.  She tries to use this medication sparingly only when pain is very severe and it was last filled in January.  When she does use medication she will take 2 tabs, she reports poor benefit when just taking 1 tab.  No side effects with the medication.  Prior UDS results: No results found for: LABOPIA, COCAINSCRNUR, LABBENZ, AMPHETMU, THCU, LABBARB   Interval history 04/21/24 Reports a new diagnosis of pancreatic cancer, which was an incidental finding. Currently undergoing chemotherapy. Reports fatigue since starting chemotherapy.  Pain medication is keeping pain under control. The primary site of pain is the shoulder. The last injection in the shoulder provided good relief and is still helping. Pain medication is taken on an as-needed basis, typically twice a day. The caregiver manages medication administration and will only provide it when pain is significant. States they take it for severe pain that affects sleep or comfort, but tries to avoid it otherwise due to concerns about side effects (drowsiness) and addiction. The current dose has been stable. Hip and knee pain are not significant at this time. The right hip is not currently painful. Pain medication is primarily used for shoulder pain but has been taken for hip pain in the past. Laying on the right side is difficult due to shoulder pain.  The oncologist has recommended over-the-counter Tylenol  for pain from the cancer or chemotherapy. Discussed that if cancer pain becomes severe, the oncology team will sometimes manages opioid prescriptions. Happy to coordinate care with  them.  Interval history 05/12/24 Reports persistent right shoulder pain. Pain is present with movement in all directions and also occurs at night, disrupting sleep. Reports tenderness to palpation over the shoulder. The previous injection provided long-term relief. Tries to use pain medication sparingly. Reports knees are doing well. Continues to undergo chemotherapy every other week for an pancreatic cancer and will be finished between December and January if all goes well. Reports fatigue from chemotherapy but is resting as advised. Denies any history of diabetes or high blood sugar.  Pain Inventory Average Pain 5 Pain Right Now 3 My pain is constant and aching  In the last 24 hours, has pain interfered with the following? General activity 2 Relation with others 0 Enjoyment of life 0 What TIME of day is your pain at its worst? night Sleep (in general) Poor  Pain is worse with: unsure Pain improves with: medication Relief from Meds: 9    Family History  Problem Relation Age of Onset   Pancreatic cancer Mother    Thyroid  cancer Sister    Colon polyps Daughter    Kidney cancer Daughter    Sleep apnea Daughter    Breast cancer Maternal Aunt 38   Seizures Paternal Grandmother    Cancer Brother        tongue   Sleep apnea Son    Stroke Son    Seizures Son    Sleep apnea Son    Seizures Grandson    Colon cancer Neg Hx    Crohn's disease Neg Hx    Esophageal cancer Neg Hx    Rectal cancer Neg Hx    Stomach cancer Neg Hx    Ulcerative colitis Neg Hx    Dementia Neg Hx    Social History   Socioeconomic History   Marital status: Widowed    Spouse name: Not on file   Number of children: 6   Years of education: 79   Highest education level: Not on file  Occupational History   Not on file  Tobacco Use   Smoking status: Never    Passive exposure: Never   Smokeless tobacco: Never  Vaping Use   Vaping status: Never Used  Substance and Sexual Activity   Alcohol use:  Not Currently   Drug use: Never   Sexual activity: Not on file  Other Topics Concern   Not on file  Social History Narrative   Lives at home with daughter   Right handed   Caffeine: none    Social Drivers of Corporate investment banker Strain: Low Risk  (02/20/2024)   Overall Financial Resource Strain (CARDIA)    Difficulty of Paying Living Expenses: Not hard at all  Food Insecurity: No Food Insecurity (03/17/2024)   Hunger Vital Sign    Worried About Running Out of Food in the Last Year: Never true    Ran Out of Food in the Last Year: Never true  Transportation Needs: No Transportation Needs (03/17/2024)   PRAPARE - Administrator, Civil Service (Medical): No    Lack of Transportation (Non-Medical): No  Physical Activity: Inactive (02/20/2024)   Exercise Vital Sign    Days of Exercise per Week: 0 days    Minutes of Exercise per Session: 0 min  Stress: No Stress Concern Present (02/20/2024)   [employer] of Occupational Health - Occupational Stress Questionnaire    Feeling of Stress: Not at all  Social Connections: Moderately Integrated (02/20/2024)   Social Connection and Isolation Panel    Frequency of Communication with Friends and Family: More than three times a week    Frequency of Social Gatherings with Friends and Family: More than three times a week    Attends Religious Services: More than 4 times per year    Active Member of [employer] or Organizations: Yes    Attends Banker Meetings: More than 4 times per year    Marital Status: Widowed   Past Surgical History:  Procedure Laterality Date   ABDOMINAL HYSTERECTOMY     BREAST BIOPSY Right    benign   CHOLECYSTECTOMY     ESOPHAGOGASTRODUODENOSCOPY N/A 03/03/2024   Procedure: EGD (ESOPHAGOGASTRODUODENOSCOPY);  Surgeon: Wilhelmenia Aloha Raddle., MD;  Location: [facility];  Service: Gastroenterology;  Laterality: N/A;   EUS N/A 03/03/2024   Procedure: ULTRASOUND, UPPER GI TRACT, ENDOSCOPIC;   Surgeon: Wilhelmenia Aloha Raddle., MD;  Location: [facility];  Service: Gastroenterology;  Laterality: N/A;   FINE  NEEDLE ASPIRATION  03/03/2024   Procedure: FINE NEEDLE ASPIRATION;  Surgeon: Wilhelmenia Aloha Raddle., MD;  Location: [facility];  Service: Gastroenterology;;   IR IMAGING GUIDED PORT INSERTION  03/31/2024   left and right rotator cuff     Past Medical History:  Diagnosis Date   Acid reflux    Allergy    Anxiety    DDD (degenerative disc disease), lumbar    Depression    Facet joint disease    GERD (gastroesophageal reflux disease)    High cholesterol    Hypertension    Low vitamin D  level    OSA (obstructive sleep apnea)    Primary osteoarthritis involving multiple joints    Pulmonary embolism (HCC)    Seizures (HCC)    Sleep apnea    C PAP   There were no vitals taken for this visit.  Opioid Risk Score:   Fall Risk Score:  `1  Depression screen PHQ 2/9     04/25/2024   10:00 AM 04/21/2024   11:01 AM 04/11/2024   12:48 PM 03/14/2024    3:22 PM 02/20/2024    1:53 PM 02/06/2024   10:27 AM 09/27/2023    4:21 PM  Depression screen PHQ 2/9  Decreased Interest 0 0 0 0 0 0 0  Down, Depressed, Hopeless 0 0 0 0 0 0 0  PHQ - 2 Score 0 0 0 0 0 0 0  Altered sleeping     0 1 3  Tired, decreased energy     0 0 2  Change in appetite     0 0 1  Feeling bad or failure about yourself      0 0 0  Trouble concentrating     0 0 0  Moving slowly or fidgety/restless     0 0 0  Suicidal thoughts     0 0 0  PHQ-9 Score     0 1 6  Difficult doing work/chores       Not difficult at all    Review of Systems  Constitutional: Negative.   HENT: Negative.    Respiratory:  Negative for apnea.   Cardiovascular: Negative.   Gastrointestinal: Negative.        Liquid stools - Gallbladder removed years ago  Endocrine: Negative.   Genitourinary: Negative.   Musculoskeletal:  Positive for gait problem.       Right shoulder  Skin: Negative.   Allergic/Immunologic: Negative.    Hematological: Negative.   Psychiatric/Behavioral: Negative.    All other systems reviewed and are negative.      Objective:   Physical Exam   Gen: no distress, normal appearing HEENT: oral mucosa pink and moist, NCAT Chest: normal effort, normal rate of breathing Abd: soft, non-distended Ext: no edema Psych: pleasant, normal affect Skin: intact Neuro: Alert and awake, follows simple commands, hard of hearing  RUE: 4-/5 Deltoid-limited by pain, 5/5 Biceps, 5/5 Triceps,  5/5 Grip LUE: 5/5 Deltoid, 5/5 Biceps, 5/5 Triceps, 5/5 Grip RLE: HF 5/5, KE 5/5, KF 5/5, ADF 5/5, APF 5/5 LLE: HF 5/5, KE 5/5, HF, 5/5, ADF 5/5, APF 5/5 Sensory exam intact for light touch and pain in all 4 limbs.   Musculoskeletal:  Examination of the right shoulder reveals tenderness to palpation. Pain is elicited with both active and passive range of motion in multiple planes. Minimal Knee joint line tenderness   Shoulder R xray 03/16/23 The transscapular and axillary views are limited. Significant degenerative change in the glenohumeral joint  with osteophytes off the inferior glenoid and medial humeral head. AC joint degenerative change. No fracture. No evidence of dislocation. No other bony or soft tissue abnormalities identified.   IMPRESSION: Degenerative changes as above. No fracture or dislocation identified.    C spine CT 12/11/21 Alignment: Normal.   Skull base and vertebrae: No acute fracture. No primary bone lesion or focal pathologic process.   Soft tissues and spinal canal: No prevertebral fluid or swelling. No visible canal hematoma.   Disc levels: Multilevel degenerative disc disease/spondylosis and RIGHT facet arthropathy noted. The following findings are noted:   C2-3: A small central disc protrusion is noted with moderate RIGHT bony foraminal narrowing   C3-4: Mild central spinal and moderate RIGHT bony foraminal narrowing   C4-5: Moderate central spinal and mild to  moderate RIGHT bony foraminal narrowing   C5-C6: With broad-based disc bulge, mild to moderate central spinal and moderate bony biforaminal narrowing   C6-7: With broad-based disc bulge, mild central spinal, mild RIGHT and moderate LEFT bony foraminal narrowing   Upper chest: No acute abnormality   Other: None   IMPRESSION: 1. No evidence of acute intracranial abnormality. 2. No static evidence of acute injury to the cervical spine. Degenerative changes as described.       Assessment & Plan:   1) Chronic Right shoulder OA, history of rotator cuff impingement on the right shoulder 2) Chronic Right hip pain, suspect OA 3) Chronic B/L knee OA 4) New diagnosis Pancreatic Cancer  --Continue UDS and pill counts.  Continue PDMP monitoring.  Pain contract completed prior visit. -Adjusted pain medication regimen: Change from oxycodone  10 mg to oxycodone  5 mg. -Continue oxycodone  1 tablet (5 mg) for moderate pain (e.g., 6-7/10) and 2 tablets (10 mg) for severe pain (e.g., 8-10/10). She uses this sparingly. -Continue duloxetine  20mg  daily for MSK pain, consider trying higher dose at future visit  -Family is monitoring her medication use and only giving her medication when she is reporting significant pain-I think this is a good idea to continue -Xray B/L Knees completed - OA noted  -Colace daily for stool softener, miralax if stronger medication needed - Left knee injection completed prior visit with benefit - Counseled on potential side effects of steroid injection, including temporary elevation of blood sugar, though patient has no history of diabetes. Advised on the benefits of spacing out injections to minimize risk to shoulder tendons/ligaments. - Drug screen (oral swab) was collected last visit- consistent  Right shoulder injection     Indication:Right Shoulder pain not relieved by medication management and other conservative care.   Informed consent was obtained after describing  risks and benefits of the procedure with the patient, this includes bleeding, bruising, infection and medication side effects. The patient wishes to proceed and has given written consent. Patient was placed in a seated position. The Right shoulder was marked and prepped with betadine in the subacromial area. A 25-gauge 1-1/2 inch needle was inserted into the subacromial area. After negative draw back for blood, a solution containing 1 mL of 6 mg per ML betamethasone and 4 mL of 1% lidocaine  was injected. A band aid was applied. The patient tolerated the procedure well. Post procedure instructions were given.   Pt reports shoulder pain feels improved after injection completed. Discussed likely due to lidocaine .  .

## 2024-05-14 ENCOUNTER — Encounter: Payer: Self-pay | Admitting: General Practice

## 2024-05-14 ENCOUNTER — Ambulatory Visit

## 2024-05-14 NOTE — Progress Notes (Signed)
 [facility] Spiritual Care Note  Followed up briefly by phone with [name] and [name]. [name] was not feeling up to talking after a rough day yesterday. Family notes appreciation for Social Work support and requests continued prayer. [name] knows to contact chaplain whenever needed/desired, as well.  [address] Olam [place], [place], [facility] Pager [phone] Voicemail [phone]

## 2024-05-16 ENCOUNTER — Telehealth: Payer: Self-pay | Admitting: *Deleted

## 2024-05-16 MED ORDER — OXYCODONE HCL 5 MG PO TABS: 5.0000 mg | ORAL_TABLET | Freq: Two times a day (BID) | ORAL | 0 refills | Status: AC | PRN

## 2024-05-16 NOTE — Telephone Encounter (Signed)
 Insurance doesn't cover capsules of oxycodone . They cover tablets. Please reorder as tablets.

## 2024-05-19 ENCOUNTER — Encounter: Payer: Self-pay | Admitting: Family Medicine

## 2024-05-19 DIAGNOSIS — G8929 Other chronic pain: Secondary | ICD-10-CM

## 2024-05-19 DIAGNOSIS — C259 Malignant neoplasm of pancreas, unspecified: Secondary | ICD-10-CM

## 2024-05-19 DIAGNOSIS — G3184 Mild cognitive impairment, so stated: Secondary | ICD-10-CM

## 2024-05-20 ENCOUNTER — Other Ambulatory Visit: Payer: Self-pay | Admitting: Family Medicine

## 2024-05-20 DIAGNOSIS — J302 Other seasonal allergic rhinitis: Secondary | ICD-10-CM

## 2024-05-20 NOTE — Therapy (Unsigned)
 OUTPATIENT PHYSICAL THERAPY TREATMENT NOTE   Patient Name: Gina Key MRN: [id number] DOB:07-Nov-1945, 78 y.o., female Today's Date: 05/21/2024  END OF SESSION:  PT End of Session - 05/21/24 1655     Visit Number 3    Number of Visits 8    Date for Recertification  06/17/24    Authorization Type [employer]    PT Start Time 1755    Activity Tolerance Patient limited by fatigue;Patient tolerated treatment well    Behavior During Therapy Anxious            Past Medical History:  Diagnosis Date   Acid reflux    Allergy    Anxiety    DDD (degenerative disc disease), lumbar    Depression    Facet joint disease    GERD (gastroesophageal reflux disease)    High cholesterol    Hypertension    Low vitamin D  level    OSA (obstructive sleep apnea)    Primary osteoarthritis involving multiple joints    Pulmonary embolism (HCC)    Seizures (HCC)    Sleep apnea    C PAP   Past Surgical History:  Procedure Laterality Date   ABDOMINAL HYSTERECTOMY     BREAST BIOPSY Right    benign   CHOLECYSTECTOMY     ESOPHAGOGASTRODUODENOSCOPY N/A 03/03/2024   Procedure: EGD (ESOPHAGOGASTRODUODENOSCOPY);  Surgeon: Wilhelmenia Aloha Raddle., MD;  Location: [facility];  Service: Gastroenterology;  Laterality: N/A;   EUS N/A 03/03/2024   Procedure: ULTRASOUND, UPPER GI TRACT, ENDOSCOPIC;  Surgeon: Wilhelmenia Aloha Raddle., MD;  Location: [facility];  Service: Gastroenterology;  Laterality: N/A;   FINE NEEDLE ASPIRATION  03/03/2024   Procedure: FINE NEEDLE ASPIRATION;  Surgeon: Wilhelmenia Aloha Raddle., MD;  Location: [facility];  Service: Gastroenterology;;   IR IMAGING GUIDED PORT INSERTION  03/31/2024   left and right rotator cuff     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   Aortic  atherosclerosis 10/15/2023   Disc disease, degenerative, lumbar or lumbosacral 10/15/2023   Acute pulmonary embolism (HCC) 10/05/2023   Hypernatremia 10/05/2023   Dyspnea on exertion 10/05/2023   Osteopenia 12/14/2022   Essential hypertension 09/28/2021   Gastroesophageal reflux disease 09/28/2021   Seasonal allergies 09/28/2021   History of migraine 09/28/2021   Urinary incontinence 09/28/2021   Hyperlipidemia 09/28/2021   History of seizure 09/28/2021   OSA (obstructive sleep apnea) 09/28/2021    PCP: [name], MD   REFERRING PROVIDER: Aron Shoulders, MD   REFERRING DIAG: DFM related to pancreatic CA  THERAPY DIAG:  Physical deconditioning  Unsteadiness on feet  Rationale for Evaluation and Treatment: Rehabilitation  ONSET DATE: chronic  SUBJECTIVE:   SUBJECTIVE STATEMENT: Feels she is weaker and has less energy since her last session.  Has had a chemo session and will undergo another treatment this Friday.  PERTINENT HISTORY: None noted PAIN:  Are you having pain? No  PRECAUTIONS: active CA(pancreatic)  RED FLAGS: None   WEIGHT BEARING RESTRICTIONS: No  FALLS:  Has patient fallen in last 6 months? No    OCCUPATION: retired  PLOF: Independent  PATIENT GOALS: To improve my mobility  NEXT MD VISIT: TBD  OBJECTIVE:  Note: Objective measures were completed at Evaluation unless otherwise noted.  DIAGNOSTIC FINDINGS: none applicable to PT  PATIENT SURVEYS:  PSFS: THE PATIENT  SPECIFIC FUNCTIONAL SCALE  Place score of 0-10 (0 = unable to perform activity and 10 = able to perform activity at the same level as before injury or problem)  Activity Date: 04/17/24    Standing from a chair  9    2.  Standing 5 min 5    3.  Walking 5 min 5    4.      Total Score 19/30      Total Score = Sum of activity scores/number of activities  Minimally Detectable Change: 3 points (for single activity); 2 points (for average score)  [facility] (nd). The Patient Specific Functional Scale . Retrieved from [email or web address]     MUSCLE LENGTH: N/T  PALPATION: Not tested  LOWER EXTREMITY ROM:  Active ROM Right eval Left eval  Hip flexion    Hip extension    Hip abduction    Hip adduction    Hip internal rotation    Hip external rotation    Knee flexion    Knee extension    Ankle dorsiflexion    Ankle plantarflexion    Ankle inversion    Ankle eversion     (Blank rows = not tested)  LOWER EXTREMITY MMT:  MMT Right eval Left eval  Hip flexion    Hip extension    Hip abduction    Hip adduction    Hip internal rotation    Hip external rotation    Knee flexion    Knee extension    Ankle dorsiflexion    Ankle plantarflexion    Ankle inversion    Ankle eversion     (Blank rows = not tested)   FUNCTIONAL TESTS:  30 seconds chair stand test 6 reps  2 MWT 16ft with cane   GAIT: Distance walked: 66ft x2 Assistive device utilized: Single point cane Level of assistance: Modified independence Comments: slow cadence                                                                                                                                TREATMENT: [facility] Adult PT Treatment:                                                DATE: 05/21/24 Therapeutic Exercise: Seated FAQs 15/15 Seated ham curls YTB 15/15 Standing heel raises 15x Standing heel rocks 15x Standing marches 15x B Supine hip fallouts RTB 15x B, 15/15 Supine march 15/15 Supine ball press 10x B, 10/10 STS from airex 10x    [facility] Adult PT Treatment:  DATE: 05/07/24 Therapeutic Exercise: Seated FAQs 15/15 Seated ham curls YTB 15/15 Standing heel raises 15x Standing heel rocks 15x Supine hip fallouts RTB 15x B, 15/15 Supine ball press 10x B, 10/10 STS from airex 10x   [facility] Adult PT Treatment:                                                 DATE: 04/17/24 Eval and HEP Self Care: Additional minutes spent for educating on updated Therapeutic Home Exercise Program as well as comparing current status to condition at start of symptoms. This included exercises focusing on stretching, strengthening, with focus on eccentric aspects. Long term goals include an improvement in range of motion, strength, endurance as well as avoiding reinjury. Patient's frequency would include in 1-2 times a day, 3-5 times a week for a duration of 6-12 weeks. Proper technique shown and discussed handout in great detail. All questions were discussed and addressed.     PATIENT EDUCATION:  Education details: Discussed eval findings, rehab rationale and POC and patient is in agreement  Person educated: Patient Education method: Explanation and Handouts Education comprehension: verbalized understanding and needs further education  HOME EXERCISE PROGRAM: Access Code: [email or web address] URL: [email or web address] Date: 04/17/2024 Prepared by: Reyes Kohut  Exercises - Sit to Stand with Arms Crossed  - 2 x daily - 5 x weekly - 1 sets - 5 reps - Heel Toe Raises with Counter Support  - 2 x daily - 5 x weekly - 1 sets - 10 reps  ASSESSMENT:  CLINICAL IMPRESSION: Repeated prior session's exercises to assess tolerance to activity.  Able to incorporate additional exercises w/o setback.  Patient felt fine at end of session.  Will continue POC around chemo schedule.   Patient is a 78 y.o. female who was seen today for physical therapy evaluation and treatment for physical deconditioning due to ongoing CA care.  30s chair stand test shows LE strength deficits and 2 MWT shows endurance and mobility deficits.  Recommend OPPT 1w8 to accomdate chemo schedule with goal of improving strength, mobility and endurance as tolerated.  OBJECTIVE IMPAIRMENTS: Abnormal gait, cardiopulmonary status limiting activity, decreased activity tolerance, decreased endurance, decreased  mobility, difficulty walking, decreased strength, and obesity.   ACTIVITY LIMITATIONS: carrying, lifting, standing, and stairs  PERSONAL FACTORS: Age, Fitness, and 1 comorbidity: active CA are also affecting patient's functional outcome.   REHAB POTENTIAL: Good  CLINICAL DECISION MAKING: Evolving/moderate complexity  EVALUATION COMPLEXITY: Low   GOALS: Goals reviewed with patient? No  SHORT TERM GOALS: Target date: 05/15/2024   Patient to demonstrate independence in HEP  Baseline: [email or web address] Goal status: Met   LONG TERM GOALS: Target date: 06/12/2024    Assess 2 MWT for gains in distance Baseline: 179ft Goal status: INITIAL  2.  Patient will increase 30s chair stand reps from 6 to 8 without arms to demonstrate and improved functional ability with less pain/difficulty as well as reduce fall risk.  Baseline: 6 Goal status: INITIAL  3.  Patient will score at least 25/30 on PSFS to signify clinically meaningful improvement in functional abilities.   Baseline: 19/30 Goal status: INITIAL     PLAN:  PT FREQUENCY: 1x/week  PT DURATION: 8 weeks  PLANNED INTERVENTIONS: 97110-Therapeutic exercises, 97530- Therapeutic activity, [id number]- Neuromuscular re-education, 97535- Self Care, [id number]- Manual therapy, [phone]- Gait training, Patient/Family education, Balance training, and Stair training  PLAN FOR NEXT SESSION: HEP review and update, manual techniques as appropriate, aerobic tasks, ROM and flexibility activities, strengthening and PREs, TPDN, gait and balance training as needed     Reyes CHRISTELLA Kohut, PT 05/21/2024, 6:12 PM

## 2024-05-21 ENCOUNTER — Ambulatory Visit

## 2024-05-21 DIAGNOSIS — R2681 Unsteadiness on feet: Secondary | ICD-10-CM

## 2024-05-21 DIAGNOSIS — R5381 Other malaise: Secondary | ICD-10-CM

## 2024-05-22 ENCOUNTER — Telehealth: Payer: Self-pay

## 2024-05-22 NOTE — Progress Notes (Unsigned)
 Patient Care Team: Gina Clotilda SAUNDERS, MD as PCP - General (Family Medicine) Key, Gina Raddle., MD as Consulting Physician (Gastroenterology)  Clinic Day:  05/23/2024  Referring physician: [name], MD  ASSESSMENT & PLAN:   Assessment & Plan: Pancreatic adenocarcinoma [facility]) She was seen in our rapid diagnostic clinic on 02/12/2024 for consultation and was referred to GI for EUS/biopsy.  Labs on that day showed a CA 19-9 of 8, CEA undetectable, chromogranin A increased at 224.8 ng/mL.  CBCD and CMP were unremarkable with normal LFTs.  On 03/03/2024 Dr. Wilhelmenia performed EGD and EUS.  On EUS, mass was identified in the pancreatic body.  Endosonographic appearance was highly suspicious for adenocarcinoma.  Staged as T1c, N0, MX.  FNA performed.  There was no sign of significant pathology in the CBD and in the common hepatic duct.  No malignant appearing lymph nodes were visualized in the celiac region, peripancreatic region and porta hepatis.  A cyst was found in the visualized portion of the liver measured 26 mm x 23 mm.  No evidence of any solid mass lesions in the left lobe of liver.  FNA from pancreatic mass came back positive for adenocarcinoma.  On 03/11/2024, staging PET scan showed ill-defined heterogeneous low-level hypermetabolic in the mid pancreas, corresponding to the abnormality on previous MRI.  Features compatible with biopsy-proven pancreatic adenocarcinoma.  16 x 11 mm focus of amorphous soft tissue in the gastrohepatic ligament was hypermetabolic and concerning for metastatic disease.  No evidence of hypermetabolic liver metastasis.  No evidence of metastatic disease elsewhere.  Her case was discussed in GI tumor conference on 03/19/2024. Uncertainty remains regarding the second spot in the gastrohepatic ligament, which may be post-biopsy inflammation rather than cancer.   Genetic testing came back negative for any deleterious germline mutations.  Patient had  consultation with Dr. 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  The location of this does appear to be relatively central.  It is unclear to me whether this is proximal enough in the body that would require a Whipple or a distal pancreas.  It is not well-seen on imaging.  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 We will start with neoadjuvant chemotherapy.  I will then get repeat imaging.  I do want a pancreatic protocol follow-up scan.   Plan for neoadjuvant chemotherapy with gemcitabine  and Abraxane , dose reduced for her age and comorbidities, followed by surgical evaluation.  Initially will plan for chemotherapy on days 1, 15 of 28-day cycle and if well-tolerated, we will attempt to do chemo on days 1, 8, 15 of 28-day cycle.  We started with 20% dose reduction of gemcitabine  and Abraxane .   Tolerated cycle 1 of chemotherapy reasonably well without major side effects.  Had mild nausea for couple of days after chemotherapy but no other major issues.  Labs reveal no dose-limiting toxicities today.  Will proceed with cycle 2 day 15 of gemcitabine  and Abraxane  today at standard strength.  Plan to continue chemotherapy at standard strength as long as she tolerates it.  We will even consider increasing the frequency of chemotherapy to days 1, 8, 15 of 28-day cycle from cycle 3 onwards, if well-tolerated.  Plan to continue this regimen for at least three to four months before reassessing with a scan.  RTC in 2 weeks for cycle 3 day 1 of chemotherapy.     Anemia due to chemotherapy treatment Hemoglobin  10.7 and hematocrit 34.1.  Mild and stable anemia.  Will continue to monitor blood counts closely and adjust treatment as indicated.  Pancreatic adenocarcinoma Patient has  tolerated chemotherapy treatment gemcitabine  and Abraxane  very well.  She denies nausea vomiting.  Reports frequent loose stools on the first day  after treatment.  After that, stools returned to normal.  Denies constipation.  She denies neuropathy.  States appetite is good.  Weight is stable.  Denies abdominal pain.  Floaters The patient states that she experienced seeing eyeballs after cycle 2 day 1 chemotherapy with gemcitabine  and Abraxane .  After further explanation, believe she is experiencing floaters.  She states this has improved and today she has not experienced any floaters.  We discussed  the need to see her eye doctor to examine retina and vitreous.  I also encouraged her to see eye doctor emergently if she gets a sudden rush of these floaters, interfering with her vision.  She voiced understanding.  Will make an appointment with her regular eye doctor if she experiences this symptom after current treatment.  Plan Labs reviewed. -Mild and stable anemia. - Unremarkable CMP with normal LFTs. Labs and patient presentation are appropriate for treatment today. -Proceed with cycle 2 day 15 chemotherapy gemcitabine  and Abraxane . Labs/flush, follow-up, and cycle 3 day 1 chemotherapy as scheduled.  The patient understands the plans discussed today and is in agreement with them.  She knows to contact our office if she develops concerns prior to her next appointment.  I provided 25 minutes of face-to-face time during this encounter and > 50% was spent counseling as documented under my assessment and plan.    Gina FORBES Lessen, NP  [facility] - A DEPT OF [name] [address] [place] [address] Dept: [phone] Dept Fax: [phone]   No orders of the defined types were placed in this encounter.     CHIEF COMPLAINT:  CC: Pancreatic adenocarcinoma  Current Treatment: Neoadjuvant chemotherapy gemcitabine  and Abraxane   INTERVAL HISTORY:  Gina Key is here today for repeat clinical assessment.  She was last seen by Dr. Autumn on 05/09/2024.  She presents  for cycle 2 day 15 of chemotherapy gemcitabine  and Abraxane . She reports doing well since her last chemotherapy appointment only symptom she has had is appearance of eyeballs in her vision.  After discussion, I believe she is experiencing floaters in her field of vision.  This symptom has resolved.  She will make an appointment with her eye doctor if she experiences this again.  She denies chest pain, chest pressure, or shortness of breath. She denies headaches or visual disturbances. She denies abdominal pain, nausea, vomiting, or changes in bowel or bladder habits.  She reports having loose stools on the first day following chemotherapy.  Bowel movements returned to normal after that.  She denies fevers or chills. She denies pain. Her appetite is good. Her weight has been stable.  I have reviewed the past medical history, past surgical history, social history and family history with the patient and they are unchanged from previous note.  ALLERGIES:  has no known allergies.  MEDICATIONS:  Current Outpatient Medications  Medication Sig Dispense Refill   atorvastatin  (LIPITOR) 40 MG tablet      ELIQUIS  5 MG TABS tablet TAKE 1 TABLET BY MOUTH TWICE A DAY 180 tablet 1   ferrous sulfate 325 (65 FE) MG tablet Take 325 mg by mouth at bedtime.  Fexofenadine HCl (ALLEGRA PO) Take by mouth.     Incontinence Supply Disposable (WINGS HL ADULT BRIEFS/XL) MISC As needed daily. 100 each 11   ipratropium (ATROVENT ) 0.06 % nasal spray PLACE 2 SPRAYS INTO BOTH NOSTRILS 4 TIMES DAILY. 45 mL 1   levETIRAcetam  (KEPPRA ) 500 MG tablet Take 1 tablet (500 mg total) by mouth 2 (two) times daily. 180 tablet 3   lidocaine  (HM LIDOCAINE  PATCH) 4 % Place 1 patch onto the skin daily as needed.     lidocaine -prilocaine  (EMLA ) cream Apply to affected area once 30 g 3   lipase/protease/amylase (CREON ) 36000 UNITS CPEP capsule Take 2 capsules (72,000 Units total) by mouth 3 (three) times daily with meals. May also take 1  capsule (36,000 Units total) as needed (with snacks - up to 4 snacks daily). 300 capsule 11   losartan  (COZAAR ) 100 MG tablet TAKE 1 TABLET BY MOUTH EVERY DAY 90 tablet 1   metoprolol  succinate (TOPROL -XL) 25 MG 24 hr tablet TAKE 1 TABLET (25 MG TOTAL) BY MOUTH DAILY. 90 tablet 1   montelukast  (SINGULAIR ) 10 MG tablet TAKE 1 TABLET BY MOUTH EVERYDAY AT BEDTIME 90 tablet 1   omeprazole (PRILOSEC) 20 MG capsule TAKE 1 CAPSULE EVERY DAY 90 capsule 1   ondansetron  (ZOFRAN ) 8 MG tablet Take 1 tablet (8 mg total) by mouth every 8 (eight) hours as needed for nausea or vomiting. 30 tablet 1   oxyCODONE  (ROXICODONE ) 5 MG immediate release tablet Take 1-2 tablets (5-10 mg total) by mouth every 12 (twelve) hours as needed for severe pain (pain score 7-10) or moderate pain (pain score 4-6) (1 tablet for moderate pain, up to 2 tablets for severe). 90 tablet 0   potassium chloride  (KLOR-CON ) 10 MEQ tablet TAKE 1 TABLET BY MOUTH EVERY DAY 90 tablet 0   prochlorperazine  (COMPAZINE ) 10 MG tablet Take 1 tablet (10 mg total) by mouth every 6 (six) hours as needed for nausea or vomiting. 30 tablet 1   rosuvastatin  (CRESTOR ) 20 MG tablet Take 1 tablet (20 mg total) by mouth daily. 90 tablet 3   simethicone (MYLICON) 125 MG chewable tablet Chew 125 mg by mouth every 6 (six) hours as needed for flatulence.     spironolactone  (ALDACTONE ) 25 MG tablet TAKE 1 TABLET (25 MG TOTAL) BY MOUTH DAILY. 90 tablet 1   topiramate  (TOPAMAX ) 100 MG tablet TAKE 1 TABLET BY MOUTH TWICE A DAY 180 tablet 1   Current Facility-Administered Medications  Medication Dose Route Frequency Provider Last Rate Last Admin   lidocaine  (XYLOCAINE ) 1 % (with pres) injection 1 mL  1 mL Other Once        lidocaine  (XYLOCAINE ) 1 % (with pres) injection 4 mL  4 mL Other Once        triamcinolone  acetonide (KENALOG -40) injection 40 mg  40 mg Intramuscular Once        triamcinolone  acetonide (KENALOG -40) injection 40 mg  40 mg Intra-articular Once         Facility-Administered Medications Ordered in Other Visits  Medication Dose Route Frequency Provider Last Rate Last Admin   0.9 %  sodium chloride  infusion   Intravenous Continuous Pasam, Avinash, MD 10 mL/hr at 05/23/24 1358 New Bag at 05/23/24 1358   gemcitabine  (GEMZAR ) 2,052 mg in sodium chloride  0.9 % 250 mL chemo infusion  1,000 mg/m2 (Treatment Plan Recorded) Intravenous Once Pasam, Avinash, MD       PACLitaxel -protein bound (ABRAXANE ) chemo infusion 250 mg  125 mg/m2 (Treatment Plan Recorded) Intravenous  Once Autumn Millman, MD        HISTORY OF PRESENT ILLNESS:   Oncology History  Pancreatic adenocarcinoma (HCC)  03/03/2024 Initial Diagnosis   Pancreatic adenocarcinoma (HCC)   03/21/2024 Cancer Staging   Staging form: Exocrine Pancreas, AJCC 8th Edition - Clinical: Stage IIB (cT1c, cN1, cM0) - Signed by Autumn Millman, MD on 04/11/2024   03/31/2024 Genetic Testing   Negative genetic testing. No pathogenic variants identified on the [facility] CancerNext-Expanded+RNA Panel. The report date is 03/31/2024.   The CancerNext-Expanded gene panel offered by [facility] and includes sequencing, rearrangement, and RNA analysis for the following 77 genes: AIP, ALK, APC, ATM, AXIN2, BAP1, BARD1, BMPR1A, BRCA1, BRCA2, BRIP1, CDC73, CDH1, CDK4, CDKN1B, CDKN2A, CEBPA, CHEK2, CTNNA1, DDX41, DICER1, ETV6, FH, FLCN, GATA2, LZTR1, MAX, MBD4, MEN1, MET, MLH1, MSH2, MSH3, MSH6, MUTYH, NF1, NF2, NTHL1, PALB2, PHOX2B, PMS2, POT1, PRKAR1A, PTCH1, PTEN, RAD51C, RAD51D, RB1, RET, RPS20, RUNX1, SDHA, SDHAF2, SDHB, SDHC, SDHD, SMAD4, SMARCA4, SMARCB1, SMARCE1, STK11, SUFU, TMEM127, TP53, TSC1, TSC2, VHL, and WT1 (sequencing and deletion/duplication); EGFR, HOXB13, KIT, MITF, PDGFRA, POLD1, and POLE (sequencing only); EPCAM and GREM1 (deletion/duplication only).    04/11/2024 -  Chemotherapy   Patient is on Treatment Plan : PANCREATIC Abraxane  D1,15 + Gemcitabine  D1,15 q28d         REVIEW OF SYSTEMS:    Constitutional: Denies fevers, chills or abnormal weight loss Eyes: Denies blurriness of vision.  She did notice floaters for a few days after her most recent chemotherapy treatment.  This is resolved. Ears, nose, mouth, throat, and face: Denies mucositis or sore throat Respiratory: Denies cough, dyspnea or wheezes Cardiovascular: Denies palpitation, chest discomfort or lower extremity swelling Gastrointestinal:  Denies nausea, heartburn or change in bowel habits Skin: Denies abnormal skin rashes Lymphatics: Denies new lymphadenopathy or easy bruising Neurological:Denies numbness, tingling or new weaknesses Behavioral/Psych: Mood is stable, no new changes  All other systems were reviewed with the patient and are negative.   VITALS:   Today's Vitals   05/23/24 1227 05/23/24 1231  BP: 138/88   Pulse: 70   Resp: 17   Temp: 98.1 F (36.7 C)   SpO2: 97%   Weight: 214 lb 9.6 oz (97.3 kg)   PainSc:  0-No pain   Body mass index is 39.25 kg/m.   Wt Readings from Last 3 Encounters:  05/23/24 214 lb 9.6 oz (97.3 kg)  05/12/24 212 lb 9.6 oz (96.4 kg)  05/09/24 215 lb (97.5 kg)    Body mass index is 39.25 kg/m.  Performance status (ECOG): 1 - Symptomatic but completely ambulatory  PHYSICAL EXAM:   GENERAL:alert, no distress and comfortable SKIN: skin color, texture, turgor are normal, no rashes or significant lesions EYES: normal, Conjunctiva are pink and non-injected, sclera clear OROPHARYNX:no exudate, no erythema and lips, buccal mucosa, and tongue normal  NECK: supple, thyroid  normal size, non-tender, without nodularity LYMPH:  no palpable lymphadenopathy in the cervical, axillary or inguinal LUNGS: clear to auscultation and percussion with normal breathing effort HEART: regular rate & rhythm and no murmurs and no lower extremity edema ABDOMEN:abdomen soft, non-tender and normal bowel sounds Musculoskeletal:no cyanosis of digits and no clubbing  NEURO: alert & oriented x  3 with fluent speech, no focal motor/sensory deficits  LABORATORY DATA:  I have reviewed the data as listed    Component Value Date/Time   NA 143 05/23/2024 1203   K 3.9 05/23/2024 1203   CL 114 (H) 05/23/2024 1203   CO2 26 05/23/2024 1203  GLUCOSE 85 05/23/2024 1203   BUN 15 05/23/2024 1203   CREATININE 0.85 05/23/2024 1203   CALCIUM  9.3 05/23/2024 1203   PROT 6.8 05/23/2024 1203   ALBUMIN 4.2 05/23/2024 1203   AST 18 05/23/2024 1203   ALT 21 05/23/2024 1203   ALKPHOS 107 05/23/2024 1203   BILITOT 0.3 05/23/2024 1203   GFRNONAA >60 05/23/2024 1203   GFRAA 71 03/10/2021 0822    Lab Results  Component Value Date   WBC 6.2 05/23/2024   NEUTROABS 3.4 05/23/2024   HGB 10.7 (L) 05/23/2024   HCT 34.1 (L) 05/23/2024   MCV 86.3 05/23/2024   PLT 181 05/23/2024

## 2024-05-22 NOTE — Assessment & Plan Note (Signed)
 She was seen in our rapid diagnostic clinic on 02/12/2024 for consultation and was referred to GI for EUS/biopsy.  Labs on that day showed a CA 19-9 of 8, CEA undetectable, chromogranin A increased at 224.8 ng/mL.  CBCD and CMP were unremarkable with normal LFTs.  On 03/03/2024 Dr. Wilhelmenia performed EGD and EUS.  On EUS, mass was identified in the pancreatic body.  Endosonographic appearance was highly suspicious for adenocarcinoma.  Staged as T1c, N0, MX.  FNA performed.  There was no sign of significant pathology in the CBD and in the common hepatic duct.  No malignant appearing lymph nodes were visualized in the celiac region, peripancreatic region and porta hepatis.  A cyst was found in the visualized portion of the liver measured 26 mm x 23 mm.  No evidence of any solid mass lesions in the left lobe of liver.  FNA from pancreatic mass came back positive for adenocarcinoma.  On 03/11/2024, staging PET scan showed ill-defined heterogeneous low-level hypermetabolic in the mid pancreas, corresponding to the abnormality on previous MRI.  Features compatible with biopsy-proven pancreatic adenocarcinoma.  16 x 11 mm focus of amorphous soft tissue in the gastrohepatic ligament was hypermetabolic and concerning for metastatic disease.  No evidence of hypermetabolic liver metastasis.  No evidence of metastatic disease elsewhere.  Her case was discussed in GI tumor conference on 03/19/2024. Uncertainty remains regarding the second spot in the gastrohepatic ligament, which may be post-biopsy inflammation rather than cancer.   Genetic testing came back negative for any deleterious germline mutations.  Patient had consultation with Dr. 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  The location of this does appear to be relatively central.  It is unclear to me whether this is proximal enough in the body that would require a Whipple or a distal pancreas.  It is not well-seen on imaging.  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 We will start with neoadjuvant chemotherapy.  I will then get repeat imaging.  I do want a pancreatic protocol follow-up scan.   Plan for neoadjuvant chemotherapy with gemcitabine  and Abraxane , dose reduced for her age and comorbidities, followed by surgical evaluation.  Initially will plan for chemotherapy on days 1, 15 of 28-day cycle and if well-tolerated, we will attempt to do chemo on days 1, 8, 15 of 28-day cycle.  We started with 20% dose reduction of gemcitabine  and Abraxane .   Tolerated cycle 1 of chemotherapy reasonably well without major side effects.  Had mild nausea for couple of days after chemotherapy but no other major issues.  Labs reveal no dose-limiting toxicities today.  Will proceed with cycle 2 day 15 of gemcitabine  and Abraxane  today at standard strength.  Plan to continue chemotherapy at standard strength as long as she tolerates it.  We will even consider increasing the frequency of chemotherapy to days 1, 8, 15 of 28-day cycle from cycle 3 onwards, if well-tolerated.  Plan to continue this regimen for at least three to four months before reassessing with a scan.  RTC in 2 weeks for cycle 3-day 1 of chemotherapy.

## 2024-05-22 NOTE — Telephone Encounter (Signed)
 Copied from CRM [id number]. Topic: General - Other >> May 22, 2024  4:07 PM Alfonso HERO wrote: Reason for CRM: patient daughter called for status on the message sent on the 27th about patient needing in home care.

## 2024-05-23 ENCOUNTER — Encounter: Payer: Self-pay | Admitting: Nurse Practitioner

## 2024-05-23 ENCOUNTER — Inpatient Hospital Stay

## 2024-05-23 ENCOUNTER — Inpatient Hospital Stay (HOSPITAL_BASED_OUTPATIENT_CLINIC_OR_DEPARTMENT_OTHER): Admitting: Nurse Practitioner

## 2024-05-23 VITALS — BP 138/88 | HR 70 | Temp 98.1°F | Resp 17 | Wt 214.6 lb

## 2024-05-23 DIAGNOSIS — C259 Malignant neoplasm of pancreas, unspecified: Secondary | ICD-10-CM

## 2024-05-23 DIAGNOSIS — Z5111 Encounter for antineoplastic chemotherapy: Secondary | ICD-10-CM | POA: Diagnosis not present

## 2024-05-23 LAB — CMP (CANCER CENTER ONLY)
ALT: 21 U/L (ref 0–44)
AST: 18 U/L (ref 15–41)
Albumin: 4.2 g/dL (ref 3.5–5.0)
Alkaline Phosphatase: 107 U/L (ref 38–126)
Anion gap: 3 — ABNORMAL LOW (ref 5–15)
BUN: 15 mg/dL (ref 8–23)
CO2: 26 mmol/L (ref 22–32)
Calcium: 9.3 mg/dL (ref 8.9–10.3)
Chloride: 114 mmol/L — ABNORMAL HIGH (ref 98–111)
Creatinine: 0.85 mg/dL (ref 0.44–1.00)
GFR, Estimated: >60 mL/min (ref >60)
Glucose, Bld: 85 mg/dL (ref 70–99)
Potassium: 3.9 mmol/L (ref 3.5–5.1)
Sodium: 143 mmol/L (ref 135–145)
Total Bilirubin: 0.3 mg/dL (ref 0.0–1.2)
Total Protein: 6.8 g/dL (ref 6.5–8.1)

## 2024-05-23 LAB — CBC WITH DIFFERENTIAL (CANCER CENTER ONLY)
Abs Immature Granulocytes: 0.02 K/uL (ref 0.00–0.07)
Basophils Absolute: 0 K/uL (ref 0.0–0.1)
Basophils Relative: 1 %
Eosinophils Absolute: 0.1 K/uL (ref 0.0–0.5)
Eosinophils Relative: 1 %
HCT: 34.1 % — ABNORMAL LOW (ref 36.0–46.0)
Hemoglobin: 10.7 g/dL — ABNORMAL LOW (ref 12.0–15.0)
Immature Granulocytes: 0 %
Lymphocytes Relative: 33 %
Lymphs Abs: 2.1 K/uL (ref 0.7–4.0)
MCH: 27.1 pg (ref 26.0–34.0)
MCHC: 31.4 g/dL (ref 30.0–36.0)
MCV: 86.3 fL (ref 80.0–100.0)
Monocytes Absolute: 0.7 K/uL (ref 0.1–1.0)
Monocytes Relative: 11 %
Neutro Abs: 3.4 K/uL (ref 1.7–7.7)
Neutrophils Relative %: 54 %
Platelet Count: 181 K/uL (ref 150–400)
RBC: 3.95 MIL/uL (ref 3.87–5.11)
RDW: 14.3 % (ref 11.5–15.5)
WBC Count: 6.2 K/uL (ref 4.0–10.5)
nRBC: 0 % (ref 0.0–0.2)

## 2024-05-23 MED ORDER — SODIUM CHLORIDE 0.9 % IV SOLN
1000.0000 mg/m2 | Freq: Once | INTRAVENOUS | Status: AC
  Administered 2024-05-23: 2052 mg via INTRAVENOUS
  Filled 2024-05-23: qty 53.97

## 2024-05-23 MED ORDER — PACLITAXEL PROTEIN-BOUND CHEMO INJECTION 100 MG
125.0000 mg/m2 | Freq: Once | INTRAVENOUS | Status: AC
  Administered 2024-05-23: 250 mg via INTRAVENOUS
  Filled 2024-05-23: qty 50

## 2024-05-23 MED ORDER — SODIUM CHLORIDE 0.9 % IV SOLN: INTRAVENOUS | Status: DC

## 2024-05-23 MED ORDER — PROCHLORPERAZINE MALEATE 10 MG PO TABS
10.0000 mg | ORAL_TABLET | Freq: Once | ORAL | Status: AC
  Administered 2024-05-23: 10 mg via ORAL
  Filled 2024-05-23: qty 1

## 2024-05-23 NOTE — Patient Instructions (Signed)
 [facility] - A DEPT OF [place]  Discharge Instructions: Thank you for choosing [facility] to provide your oncology and hematology care.   If you have a lab appointment with the Cancer Center, please go directly to the Cancer Center and check in at the registration area.   Wear comfortable clothing and clothing appropriate for easy access to any Portacath or PICC line.   We strive to give you quality time with your provider. You may need to reschedule your appointment if you arrive late (15 or more minutes).  Arriving late affects you and other patients whose appointments are after yours.  Also, if you miss three or more appointments without notifying the office, you may be dismissed from the clinic at the provider's discretion.      For prescription refill requests, have your pharmacy contact our office and allow 72 hours for refills to be completed.    Today you received the following chemotherapy and/or immunotherapy agents: Abraxane  (paclitaxel -protein bound), Gemzar  (gemcitabine )   To help prevent nausea and vomiting after your treatment, we encourage you to take your nausea medication as directed.  BELOW ARE SYMPTOMS THAT SHOULD BE REPORTED IMMEDIATELY: *FEVER GREATER THAN 100.4 F (38 C) OR HIGHER *CHILLS OR SWEATING *NAUSEA AND VOMITING THAT IS NOT CONTROLLED WITH YOUR NAUSEA MEDICATION *UNUSUAL SHORTNESS OF BREATH *UNUSUAL BRUISING OR BLEEDING *URINARY PROBLEMS (pain or burning when urinating, or frequent urination) *BOWEL PROBLEMS (unusual diarrhea, constipation, pain near the anus) TENDERNESS IN MOUTH AND THROAT WITH OR WITHOUT PRESENCE OF ULCERS (sore throat, sores in mouth, or a toothache) UNUSUAL RASH, SWELLING OR PAIN  UNUSUAL VAGINAL DISCHARGE OR ITCHING   Items with * indicate a potential emergency and should be followed up as soon as possible or go to the Emergency Department if any problems should occur.  Please show  the CHEMOTHERAPY ALERT CARD or IMMUNOTHERAPY ALERT CARD at check-in to the Emergency Department and triage nurse.  Should you have questions after your visit or need to cancel or reschedule your appointment, please contact [facility] - A DEPT OF [facility]  Dept: [phone]  and follow the prompts.  Office hours are 8:00 a.m. to 4:30 p.m. Monday - Friday. Please note that voicemails left after 4:00 p.m. may not be returned until the following business day.  We are closed weekends and major holidays. You have access to a nurse at all times for urgent questions. Please call the main number to the clinic Dept: [phone] and follow the prompts.   For any non-urgent questions, you may also contact your provider using MyChart. We now offer e-Visits for anyone 17 and older to request care online for non-urgent symptoms. For details visit [email or web address].   Also download the MyChart app! Go to the app store, search MyChart, open the app, select [place], and log in with your MyChart username and password.

## 2024-05-27 ENCOUNTER — Telehealth (HOSPITAL_BASED_OUTPATIENT_CLINIC_OR_DEPARTMENT_OTHER): Payer: Self-pay

## 2024-05-27 ENCOUNTER — Telehealth: Payer: Self-pay

## 2024-05-27 NOTE — Telephone Encounter (Signed)
   Pre-operative Risk Assessment    Patient Name: [name]  DOB: 14-Dec-1945 MRN: [id number]   Date of last office visit: None Date of next office visit: None - Needs new patient appointment  Request for Surgical Clearance    Procedure:  Whipple/Distal Pancreatectomy   Date of Surgery:  Clearance TBD                                 Surgeon:  Dr. Jina Nephew Surgeon's Group or Practice Name:  [facility]  Phone number:  [phone] Fax number:  [phone] or [phone] - Attention: Larraine Ellen, CMA   Type of Clearance Requested:   - Medical  - Pharmacy:  Hold Apixaban  (Eliquis ) - Needs instruction as to how the pt should hold    Type of Anesthesia:  General    Additional requests/questions:  N/A  SignedPatrcia Iverson CROME   05/27/2024, 1:29 PM

## 2024-05-27 NOTE — Telephone Encounter (Signed)
 Left patient a detailed message to call back for clearance to be scheduled.

## 2024-05-27 NOTE — Telephone Encounter (Signed)
 Primary Cardiologist:None  Chart reviewed as part of pre-operative protocol coverage. Because of Gina Key past medical history and no prior evaluation by cardiology, she will require an in-person visit in order to better assess preoperative cardiovascular risk.  Pre-op covering staff: - Please schedule appointment and call patient to inform them. - Please contact requesting surgeon's office via preferred method (i.e, phone, fax) to inform them of need for appointment prior to surgery.  Eliquis  is not managed by cardiology.  Gina EMERSON Bane, NP-C  05/27/2024, 2:00 PM [address], [address] [place], [place] [address] Office [phone] Fax [phone]

## 2024-05-28 NOTE — Telephone Encounter (Signed)
 Called patient to schedule IN OFFICE VISIT spoke to patient's daughter who stated surgery is on hold for right now patient is planning on getting another scan done to see if surgery is needed or what is the next step in treatment made her aware if patient needs surgery they would need to be contact our office again for us  to provide cardiac clearance

## 2024-06-03 ENCOUNTER — Telehealth: Payer: Self-pay

## 2024-06-03 NOTE — Telephone Encounter (Signed)
 Called and left a VM to return call patient needs to be seen for Medical clearance paperwork that was sent from [facility] [place] [facility] for whipple

## 2024-06-06 ENCOUNTER — Inpatient Hospital Stay

## 2024-06-06 ENCOUNTER — Inpatient Hospital Stay (HOSPITAL_BASED_OUTPATIENT_CLINIC_OR_DEPARTMENT_OTHER): Admitting: Oncology

## 2024-06-06 ENCOUNTER — Inpatient Hospital Stay: Attending: Physician Assistant

## 2024-06-06 ENCOUNTER — Encounter: Payer: Self-pay | Admitting: Oncology

## 2024-06-06 ENCOUNTER — Other Ambulatory Visit: Payer: Self-pay

## 2024-06-06 VITALS — BP 117/64 | HR 80 | Temp 98.0°F | Resp 15 | Ht 62.0 in | Wt 214.0 lb

## 2024-06-06 DIAGNOSIS — Z7901 Long term (current) use of anticoagulants: Secondary | ICD-10-CM | POA: Diagnosis not present

## 2024-06-06 DIAGNOSIS — Z86718 Personal history of other venous thrombosis and embolism: Secondary | ICD-10-CM | POA: Diagnosis not present

## 2024-06-06 DIAGNOSIS — C259 Malignant neoplasm of pancreas, unspecified: Secondary | ICD-10-CM | POA: Insufficient documentation

## 2024-06-06 DIAGNOSIS — T451X5A Adverse effect of antineoplastic and immunosuppressive drugs, initial encounter: Secondary | ICD-10-CM | POA: Diagnosis not present

## 2024-06-06 DIAGNOSIS — D6481 Anemia due to antineoplastic chemotherapy: Secondary | ICD-10-CM | POA: Insufficient documentation

## 2024-06-06 DIAGNOSIS — Z5111 Encounter for antineoplastic chemotherapy: Secondary | ICD-10-CM | POA: Diagnosis present

## 2024-06-06 LAB — CMP (CANCER CENTER ONLY)
ALT: 24 U/L (ref 0–44)
AST: 19 U/L (ref 15–41)
Albumin: 4 g/dL (ref 3.5–5.0)
Alkaline Phosphatase: 90 U/L (ref 38–126)
Anion gap: 5 (ref 5–15)
BUN: 14 mg/dL (ref 8–23)
CO2: 23 mmol/L (ref 22–32)
Calcium: 9.1 mg/dL (ref 8.9–10.3)
Chloride: 115 mmol/L — ABNORMAL HIGH (ref 98–111)
Creatinine: 0.87 mg/dL (ref 0.44–1.00)
GFR, Estimated: >60 mL/min (ref >60)
Glucose, Bld: 131 mg/dL — ABNORMAL HIGH (ref 70–99)
Potassium: 3.8 mmol/L (ref 3.5–5.1)
Sodium: 143 mmol/L (ref 135–145)
Total Bilirubin: 0.3 mg/dL (ref 0.0–1.2)
Total Protein: 6.5 g/dL (ref 6.5–8.1)

## 2024-06-06 LAB — CBC WITH DIFFERENTIAL (CANCER CENTER ONLY)
Abs Immature Granulocytes: 0.02 K/uL (ref 0.00–0.07)
Basophils Absolute: 0 K/uL (ref 0.0–0.1)
Basophils Relative: 0 %
Eosinophils Absolute: 0.1 K/uL (ref 0.0–0.5)
Eosinophils Relative: 1 %
HCT: 31.7 % — ABNORMAL LOW (ref 36.0–46.0)
Hemoglobin: 9.8 g/dL — ABNORMAL LOW (ref 12.0–15.0)
Immature Granulocytes: 0 %
Lymphocytes Relative: 23 %
Lymphs Abs: 1.2 K/uL (ref 0.7–4.0)
MCH: 26.7 pg (ref 26.0–34.0)
MCHC: 30.9 g/dL (ref 30.0–36.0)
MCV: 86.4 fL (ref 80.0–100.0)
Monocytes Absolute: 0.7 K/uL (ref 0.1–1.0)
Monocytes Relative: 12 %
Neutro Abs: 3.4 K/uL (ref 1.7–7.7)
Neutrophils Relative %: 64 %
Platelet Count: 163 K/uL (ref 150–400)
RBC: 3.67 MIL/uL — ABNORMAL LOW (ref 3.87–5.11)
RDW: 14.5 % (ref 11.5–15.5)
WBC Count: 5.4 K/uL (ref 4.0–10.5)
nRBC: 0 % (ref 0.0–0.2)

## 2024-06-06 MED ORDER — PROCHLORPERAZINE MALEATE 10 MG PO TABS
10.0000 mg | ORAL_TABLET | Freq: Once | ORAL | Status: AC
  Administered 2024-06-06: 10 mg via ORAL
  Filled 2024-06-06: qty 1

## 2024-06-06 MED ORDER — PACLITAXEL PROTEIN-BOUND CHEMO INJECTION 100 MG
125.0000 mg/m2 | Freq: Once | INTRAVENOUS | Status: AC
  Administered 2024-06-06: 255 mg via INTRAVENOUS
  Filled 2024-06-06: qty 51

## 2024-06-06 MED ORDER — SODIUM CHLORIDE 0.9 % IV SOLN: INTRAVENOUS | Status: DC

## 2024-06-06 MED ORDER — SODIUM CHLORIDE 0.9 % IV SOLN
1000.0000 mg/m2 | Freq: Once | INTRAVENOUS | Status: AC
  Administered 2024-06-06: 2052 mg via INTRAVENOUS
  Filled 2024-06-06: qty 53.97

## 2024-06-06 NOTE — Progress Notes (Signed)
  [facility]  ONCOLOGY CLINIC PROGRESS NOTE   Patient Care Team: [name], MD as PCP - General (Family Medicine) Mansouraty, Aloha Raddle., MD as Consulting Physician (Gastroenterology)  PATIENT NAME: Gina Key   MR#: [id number] DOB: Jan 15, 1946  Date of visit: 06/06/2024   ASSESSMENT & PLAN:   Amiylah Anastos is a 78 y.o.  lady with a past medical history of hypertension, history of pulmonary embolism in March 2025, seizure disorder, obstructive sleep apnea, arthritis, GERD, was referred to our clinic in August 2025 for pancreatic adenocarcinoma.   Pancreatic adenocarcinoma [facility]) She was seen in our rapid diagnostic clinic on 02/12/2024 for consultation and was referred to GI for EUS/biopsy.  Labs on that day showed a CA 19-9 of 8, CEA undetectable, chromogranin A increased at 224.8 ng/mL.  CBCD and CMP were unremarkable with normal LFTs.  On 03/03/2024 Dr. Wilhelmenia performed EGD and EUS.  On EUS, mass was identified in the pancreatic body.  Endosonographic appearance was highly suspicious for adenocarcinoma.  Staged as T1c, N0, MX.  FNA performed.  There was no sign of significant pathology in the CBD and in the common hepatic duct.  No malignant appearing lymph nodes were visualized in the celiac region, peripancreatic region and porta hepatis.  A cyst was found in the visualized portion of the liver measured 26 mm x 23 mm.  No evidence of any solid mass lesions in the left lobe of liver.  FNA from pancreatic mass came back positive for adenocarcinoma.  On 03/11/2024, staging PET scan showed ill-defined heterogeneous low-level hypermetabolic in the mid pancreas, corresponding to the abnormality on previous MRI.  Features compatible with biopsy-proven pancreatic adenocarcinoma.  16 x 11 mm focus of amorphous soft tissue in the gastrohepatic ligament was hypermetabolic and concerning for metastatic disease.  No evidence of hypermetabolic liver metastasis.   No evidence of metastatic disease elsewhere.  Her case was discussed in GI tumor conference on 03/19/2024. Uncertainty remains regarding the second spot in the gastrohepatic ligament, which may be post-biopsy inflammation rather than cancer.   Genetic testing came back negative for any deleterious germline mutations.  Patient had consultation with Dr. 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  The location of this does appear to be relatively central.  It is unclear to me whether this is proximal enough in the body that would require a Whipple or a distal pancreas.  It is not well-seen on imaging.  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 We will start with neoadjuvant chemotherapy.  I will then get repeat imaging.  I do want a pancreatic protocol follow-up scan.   Plan for neoadjuvant chemotherapy with gemcitabine  and Abraxane , dose reduced for her age and comorbidities, followed by surgical evaluation.  Initially will plan for chemotherapy on days 1, 15 of 28-day cycle and if well-tolerated, we will attempt to do chemo on days 1, 8, 15 of 28-day cycle.  We started with 20% dose reduction of gemcitabine  and Abraxane .  Increased to standard dosing from cycle 2 onwards.  She has been tolerating chemotherapy reasonably well without major side effects.  Had mild nausea for couple of days after chemotherapy but no other major issues.  Labs reveal no dose-limiting toxicities today.  Will proceed with cycle 3 day 1 of gemcitabine  and Abraxane  today at standard strength.  Plan to continue chemotherapy at standard strength as long as she  tolerates it.  We will increase the frequency of chemotherapy to days 1, 8, 15 of 28-day cycle from cycle 3 onwards, since she is tolerating chemotherapy well.  Following completion of cycle 3, we will obtain CT pancreatic protocol for restaging.  Request placed today.  RTC in 1 week for  cycle 3-day 8 of chemotherapy.   Chemotherapy-induced anemia Anemia secondary to chemotherapy with hemoglobin levels around 10, slightly decreased from previous levels but within acceptable range. No immediate intervention required as long as hemoglobin remains above 8. - Continue to monitor hemoglobin levels.  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 Blood counts are well-managed. - Continue Eliquis  for anticoagulation. - Monitor blood counts regularly during chemotherapy.   I reviewed lab results and outside records for this visit and discussed relevant results with the patient. Diagnosis, plan of care and treatment options were also discussed in detail with the patient. Opportunity provided to ask questions and answers provided to her apparent satisfaction. Provided instructions to call our clinic with any problems, questions or concerns prior to return visit. I recommended to continue follow-up with PCP and sub-specialists. She verbalized understanding and agreed with the plan.   NCCN guidelines have been consulted in the planning of this patient's care.  I spent a total of 42 minutes during this encounter with the patient including review of chart and various tests results, discussions about plan of care and coordination of care plan.   [name], MD  06/06/2024 3:08 PM  [facility] - A DEPT OF [facility] [address] [place] [address] Dept: [phone] Dept Fax: [phone]    CHIEF COMPLAINT/ REASON FOR VISIT:   Adenocarcinoma of pancreas, Stage I vs stage II  Current Treatment: Plan for neoadjuvant chemotherapy with dose reduce gemcitabine  and Abraxane , followed by surgical evaluation.  Started systemic treatment with gemcitabine  and Abraxane  from 04/11/2024.  INTERVAL HISTORY:    Discussed the use of AI scribe software for clinical  note transcription with the patient, who gave verbal consent to proceed.  History of Present Illness  Gina Key is a 78 year old female with pancreatic cancer who presents for chemotherapy follow-up.  She is currently undergoing chemotherapy for pancreatic cancer and is starting her third cycle this week. She has not experienced major side effects and is tolerating the treatment well. A CT scan is scheduled for early December to assess the response to treatment.  She has not experienced nausea, vomiting, or mouth sores. Bowel movements are regular, and she maintains a good appetite, stating she doesn't miss a meal. Energy levels are generally good, although there are occasional days of low energy.  She has experienced seeing 'white spots,' which were evaluated by an eye doctor and determined to be benign floaters.  Her hemoglobin levels have been slightly low, around 9.8 to 10, which is expected with chemotherapy. Red and white blood cell counts, as well as platelets, are within normal limits.   I have reviewed the past medical history, past surgical history, social history and family history with the patient and they are unchanged from previous note.  HISTORY OF PRESENT ILLNESS:   ONCOLOGY HISTORY:   On review of the previous records patient was evaluated in the emergency department on 02/05/2024 for gross hematuria.  CT abdomen pelvis was performed which showed moderate dilation of the pancreatic duct in the mid body and tail overlying and parenchymal  atrophy. MR abdomen was ordered by PCP Dr. Mercer and performed on 02/07/24. MRI showed a focal segment of pancreatic ductal dilatation with abrupt caliber change and stricture. Subtle mass lesion in this location identified measuring 17 x 15 mm at the level of the caliber change and has appearance worrisome for neoplasm such as an adenocarcinoma. Image did not show any other areas concerning for malignant disease.    CMP collected  02/05/24 in the ED was overall unremarkable. Further chart review shows patient was diagnosed with a submassive PE with right heart strain 10/05/23 and is currently taking Eliquis .     She is retired, having worked in associate professor. She does not smoke or drink alcohol. She currently lives with her daughter due to past seizures. Her last colonoscopy in February 2024 was normal, as was her last mammogram. Her family history is significant for her mother having pancreatic cancer at age 47, her brother having throat cancer, and her daughter having kidney cancer.   She was seen in our rapid diagnostic clinic on 02/12/2024 for consultation and was referred to GI for EUS/biopsy.  Labs on that day showed a CA 19-9 of 8, CEA undetectable, chromogranin A increased at 224.8 ng/mL.  CBCD and CMP were unremarkable with normal LFTs.   On 03/03/2024 Dr. Wilhelmenia performed EGD and EUS.  On EUS, mass was identified in the pancreatic body.  Endosonographic appearance was highly suspicious for adenocarcinoma.  Staged as T1c, N0, MX.  FNA performed.  There was no sign of significant pathology in the CBD and in the common hepatic duct.  No malignant appearing lymph nodes were visualized in the celiac region, peripancreatic region and porta hepatis.  A cyst was found in the visualized portion of the liver measured 26 mm x 23 mm.  No evidence of any solid mass lesions in the left lobe of liver.   FNA from pancreatic mass came back positive for adenocarcinoma.   On 03/11/2024, staging PET scan showed ill-defined heterogeneous low-level hypermetabolic in the mid pancreas, corresponding to the abnormality on previous MRI.  Features compatible with biopsy-proven pancreatic adenocarcinoma.  16 x 11 mm focus of amorphous soft tissue in the gastrohepatic ligament was hypermetabolic and concerning for metastatic disease.  No evidence of hypermetabolic liver metastasis.  No evidence of metastatic disease elsewhere.   Her case was  discussed in GI tumor conference on 03/19/2024. Uncertainty remains regarding the second spot in the gastrohepatic ligament, which may be post-biopsy inflammation rather than cancer.   Genetic testing came back negative for any deleterious germline mutations.  Patient had consultation with Dr. 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  The location of this does appear to be relatively central.  It is unclear to me whether this is proximal enough in the body that would require a Whipple or a distal pancreas.  It is not well-seen on imaging.  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 We will start with neoadjuvant chemotherapy.  I will then get repeat imaging.  I do want a pancreatic protocol follow-up scan.   Plan for neoadjuvant chemotherapy with gemcitabine  and Abraxane , dose reduced for her age and comorbidities, followed by surgical evaluation.  Started systemic treatment with gemcitabine  and Abraxane  from 04/11/2024.  Oncology History  Pancreatic adenocarcinoma (HCC)  03/03/2024 Initial Diagnosis   Pancreatic adenocarcinoma (HCC)   03/21/2024 Cancer Staging   Staging form: Exocrine Pancreas, AJCC 8th Edition -  Clinical: Stage IIB (cT1c, cN1, cM0) - Signed by Autumn Millman, MD on 04/11/2024   03/31/2024 Genetic Testing   Negative genetic testing. No pathogenic variants identified on the [facility] CancerNext-Expanded+RNA Panel. The report date is 03/31/2024.   The CancerNext-Expanded gene panel offered by [facility] and includes sequencing, rearrangement, and RNA analysis for the following 77 genes: AIP, ALK, APC, ATM, AXIN2, BAP1, BARD1, BMPR1A, BRCA1, BRCA2, BRIP1, CDC73, CDH1, CDK4, CDKN1B, CDKN2A, CEBPA, CHEK2, CTNNA1, DDX41, DICER1, ETV6, FH, FLCN, GATA2, LZTR1, MAX, MBD4, MEN1, MET, MLH1, MSH2, MSH3, MSH6, MUTYH, NF1, NF2, NTHL1, PALB2, PHOX2B, PMS2, POT1, PRKAR1A, PTCH1, PTEN, RAD51C, RAD51D, RB1, RET, RPS20,  RUNX1, SDHA, SDHAF2, SDHB, SDHC, SDHD, SMAD4, SMARCA4, SMARCB1, SMARCE1, STK11, SUFU, TMEM127, TP53, TSC1, TSC2, VHL, and WT1 (sequencing and deletion/duplication); EGFR, HOXB13, KIT, MITF, PDGFRA, POLD1, and POLE (sequencing only); EPCAM and GREM1 (deletion/duplication only).    04/11/2024 -  Chemotherapy   Patient is on Treatment Plan : PANCREATIC Abraxane  D1,8,15 + Gemcitabine  D1,8,15 q28d         REVIEW OF SYSTEMS:   Review of Systems - Oncology  All other pertinent systems were reviewed with the patient and are negative.  ALLERGIES: She has no known allergies.  MEDICATIONS:  Current Outpatient Medications  Medication Sig Dispense Refill   atorvastatin  (LIPITOR) 40 MG tablet      ELIQUIS  5 MG TABS tablet TAKE 1 TABLET BY MOUTH TWICE A DAY 180 tablet 1   ferrous sulfate 325 (65 FE) MG tablet Take 325 mg by mouth at bedtime.     Fexofenadine HCl (ALLEGRA PO) Take by mouth.     Incontinence Supply Disposable (WINGS HL ADULT BRIEFS/XL) MISC As needed daily. 100 each 11   ipratropium (ATROVENT ) 0.06 % nasal spray PLACE 2 SPRAYS INTO BOTH NOSTRILS 4 TIMES DAILY. 15 mL 1   levETIRAcetam  (KEPPRA ) 500 MG tablet Take 1 tablet (500 mg total) by mouth 2 (two) times daily. 180 tablet 3   lidocaine  (HM LIDOCAINE  PATCH) 4 % Place 1 patch onto the skin daily as needed.     lidocaine -prilocaine  (EMLA ) cream Apply to affected area once 30 g 3   lipase/protease/amylase (CREON ) 36000 UNITS CPEP capsule Take 2 capsules (72,000 Units total) by mouth 3 (three) times daily with meals. May also take 1 capsule (36,000 Units total) as needed (with snacks - up to 4 snacks daily). 300 capsule 11   losartan  (COZAAR ) 100 MG tablet TAKE 1 TABLET BY MOUTH EVERY DAY 90 tablet 1   metoprolol  succinate (TOPROL -XL) 25 MG 24 hr tablet TAKE 1 TABLET (25 MG TOTAL) BY MOUTH DAILY. 90 tablet 1   montelukast  (SINGULAIR ) 10 MG tablet TAKE 1 TABLET BY MOUTH EVERYDAY AT BEDTIME 90 tablet 1   omeprazole (PRILOSEC) 20 MG capsule  TAKE 1 CAPSULE EVERY DAY 90 capsule 1   ondansetron  (ZOFRAN ) 8 MG tablet Take 1 tablet (8 mg total) by mouth every 8 (eight) hours as needed for nausea or vomiting. 30 tablet 1   oxyCODONE  (ROXICODONE ) 5 MG immediate release tablet Take 1-2 tablets (5-10 mg total) by mouth every 12 (twelve) hours as needed for severe pain (pain score 7-10) or moderate pain (pain score 4-6) (1 tablet for moderate pain, up to 2 tablets for severe). 90 tablet 0   potassium chloride  (KLOR-CON ) 10 MEQ tablet TAKE 1 TABLET BY MOUTH EVERY DAY 90 tablet 0   prochlorperazine  (COMPAZINE ) 10 MG tablet Take 1 tablet (10 mg total) by mouth every 6 (six) hours as needed for nausea  or vomiting. 30 tablet 1   rosuvastatin  (CRESTOR ) 20 MG tablet Take 1 tablet (20 mg total) by mouth daily. 90 tablet 3   simethicone (MYLICON) 125 MG chewable tablet Chew 125 mg by mouth every 6 (six) hours as needed for flatulence.     spironolactone  (ALDACTONE ) 25 MG tablet TAKE 1 TABLET (25 MG TOTAL) BY MOUTH DAILY. 90 tablet 1   topiramate  (TOPAMAX ) 100 MG tablet TAKE 1 TABLET BY MOUTH TWICE A DAY 180 tablet 1   Current Facility-Administered Medications  Medication Dose Route Frequency Provider Last Rate Last Admin   lidocaine  (XYLOCAINE ) 1 % (with pres) injection 1 mL  1 mL Other Once        lidocaine  (XYLOCAINE ) 1 % (with pres) injection 4 mL  4 mL Other Once        triamcinolone  acetonide (KENALOG -40) injection 40 mg  40 mg Intramuscular Once        triamcinolone  acetonide (KENALOG -40) injection 40 mg  40 mg Intra-articular Once        Facility-Administered Medications Ordered in Other Visits  Medication Dose Route Frequency Provider Last Rate Last Admin   0.9 %  sodium chloride  infusion   Intravenous Continuous Brinson Tozzi, MD   Stopped at 06/06/24 1242     VITALS:   Blood pressure 117/64, pulse 80, temperature 98 F (36.7 C), temperature source Temporal, resp. rate 15, height 5' 2 (1.575 m), weight 214 lb (97.1 kg), SpO2 100%.  Wt  Readings from Last 3 Encounters:  06/06/24 214 lb (97.1 kg)  05/23/24 214 lb 9.6 oz (97.3 kg)  05/12/24 212 lb 9.6 oz (96.4 kg)    Body mass index is 39.14 kg/m.    Onc Performance Status - 06/06/24 0900       ECOG Perf Status   ECOG Perf Status Capable of only limited selfcare, confined to bed or chair more than 50% of waking hours      KPS SCALE   KPS % SCORE Cares for self, unable to carry on normal activity or to do active work           PHYSICAL EXAM:   Physical Exam Constitutional:      General: She is not in acute distress.    Appearance: Normal appearance.  HENT:     Head: Normocephalic and atraumatic.  Cardiovascular:     Rate and Rhythm: Normal rate.  Pulmonary:     Effort: Pulmonary effort is normal. No respiratory distress.  Abdominal:     General: There is no distension.  Neurological:     General: No focal deficit present.     Mental Status: She is alert and oriented to person, place, and time.  Psychiatric:        Mood and Affect: Mood normal.        Behavior: Behavior normal.       LABORATORY DATA:   I have reviewed the data as listed.  Results for orders placed or performed in visit on 06/06/24  CBC with Differential (Cancer Center Only)  Result Value Ref Range   WBC Count 5.4 4.0 - 10.5 K/uL   RBC 3.67 (L) 3.87 - 5.11 MIL/uL   Hemoglobin 9.8 (L) 12.0 - 15.0 g/dL   HCT 68.2 (L) 63.9 - 53.9 %   MCV 86.4 80.0 - 100.0 fL   MCH 26.7 26.0 - 34.0 pg   MCHC 30.9 30.0 - 36.0 g/dL   RDW 85.4 88.4 - 84.4 %   Platelet Count 163  150 - 400 K/uL   nRBC 0.0 0.0 - 0.2 %   Neutrophils Relative % 64 %   Neutro Abs 3.4 1.7 - 7.7 K/uL   Lymphocytes Relative 23 %   Lymphs Abs 1.2 0.7 - 4.0 K/uL   Monocytes Relative 12 %   Monocytes Absolute 0.7 0.1 - 1.0 K/uL   Eosinophils Relative 1 %   Eosinophils Absolute 0.1 0.0 - 0.5 K/uL   Basophils Relative 0 %   Basophils Absolute 0.0 0.0 - 0.1 K/uL   Immature Granulocytes 0 %   Abs Immature Granulocytes  0.02 0.00 - 0.07 K/uL  CMP (Cancer Center only)  Result Value Ref Range   Sodium 143 135 - 145 mmol/L   Potassium 3.8 3.5 - 5.1 mmol/L   Chloride 115 (H) 98 - 111 mmol/L   CO2 23 22 - 32 mmol/L   Glucose, Bld 131 (H) 70 - 99 mg/dL   BUN 14 8 - 23 mg/dL   Creatinine 9.12 9.55 - 1.00 mg/dL   Calcium  9.1 8.9 - 10.3 mg/dL   Total Protein 6.5 6.5 - 8.1 g/dL   Albumin 4.0 3.5 - 5.0 g/dL   AST 19 15 - 41 U/L   ALT 24 0 - 44 U/L   Alkaline Phosphatase 90 38 - 126 U/L   Total Bilirubin 0.3 0.0 - 1.2 mg/dL   GFR, Estimated >39 >39 mL/min   Anion gap 5 5 - 15      RADIOGRAPHIC STUDIES:  Previous imaging studies, including staging studies reviewed.   CODE STATUS:  Code Status History     Date Active Date Inactive Code Status Order ID Comments User Context   10/05/2023 0213 10/06/2023 1942 Full Code [id number]  Lee Kingfisher, MD ED    Questions for Most Recent Historical Code Status (Order [id number])     Question Answer   By: Consent: discussion documented in EHR            Orders Placed This Encounter  Procedures   CT ABD PELVIS W/WO CM ONCOLOGY PANCREATIC PROTOCOL    Standing Status:   Future    Expected Date:   06/24/2024    Expiration Date:   06/06/2025    If indicated for the ordered procedure, I authorize the administration of contrast media per Radiology protocol:   Yes    Does the patient have a contrast media/X-ray dye allergy?:   No    Preferred [facility]    If indicated for the ordered procedure, I authorize the administration of oral contrast media per Radiology protocol:   Yes   CBC with Differential (Cancer Center Only)    Standing Status:   Future    Expected Date:   07/04/2024    Expiration Date:   07/04/2025   CMP (Cancer Center only)    Standing Status:   Future    Expected Date:   07/04/2024    Expiration Date:   07/04/2025   CBC with Differential (Cancer Center Only)    Standing Status:   Future    Expected Date:    07/11/2024    Expiration Date:   07/11/2025   CMP (Cancer Center only)    Standing Status:   Future    Expected Date:   07/11/2024    Expiration Date:   07/11/2025   CBC with Differential (Cancer Center Only)    Standing Status:   Future    Expected Date:   07/18/2024  Expiration Date:   07/18/2025   CMP (Cancer Center only)    Standing Status:   Future    Expected Date:   07/18/2024    Expiration Date:   07/18/2025     Future Appointments  Date Time Provider Department Center  06/11/2024  7:45 AM Buck Saucer, MD [facility] None  06/12/2024  5:00 PM Joshua Gun, PT [facility]  06/13/2024  9:15 AM [facility] FLUSH [facility] None  06/13/2024  9:45 AM Gwenneth Whiteman, Chinita, MD [facility] None  06/13/2024 10:30 AM [facility] INFUSION [facility] None  06/13/2024 12:00 PM Ivonne Harlene RAMAN, RD [facility] None  06/17/2024 11:30 AM Zenaida Reyes HERO, PT [facility]  06/21/2024  8:30 AM [facility] FLUSH [facility] None  06/21/2024  9:00 AM [facility] INFUSION [facility] None  07/28/2024  2:40 PM Urbano Albright, MD [facility] CPR  02/25/2025  1:40 PM [facility]-ANNUAL WELLNESS VISIT [facility] [address]      This document was completed utilizing speech recognition software. Grammatical errors, random word insertions, pronoun errors, and incomplete sentences are an occasional consequence of this system due to software limitations, ambient noise, and hardware issues. Any formal questions or concerns about the content, text or information contained within the body of this dictation should be directly addressed to the provider for clarification.

## 2024-06-06 NOTE — Assessment & Plan Note (Signed)
 She was seen in our rapid diagnostic clinic on 02/12/2024 for consultation and was referred to GI for EUS/biopsy.  Labs on that day showed a CA 19-9 of 8, CEA undetectable, chromogranin A increased at 224.8 ng/mL.  CBCD and CMP were unremarkable with normal LFTs.  On 03/03/2024 Dr. Wilhelmenia performed EGD and EUS.  On EUS, mass was identified in the pancreatic body.  Endosonographic appearance was highly suspicious for adenocarcinoma.  Staged as T1c, N0, MX.  FNA performed.  There was no sign of significant pathology in the CBD and in the common hepatic duct.  No malignant appearing lymph nodes were visualized in the celiac region, peripancreatic region and porta hepatis.  A cyst was found in the visualized portion of the liver measured 26 mm x 23 mm.  No evidence of any solid mass lesions in the left lobe of liver.  FNA from pancreatic mass came back positive for adenocarcinoma.  On 03/11/2024, staging PET scan showed ill-defined heterogeneous low-level hypermetabolic in the mid pancreas, corresponding to the abnormality on previous MRI.  Features compatible with biopsy-proven pancreatic adenocarcinoma.  16 x 11 mm focus of amorphous soft tissue in the gastrohepatic ligament was hypermetabolic and concerning for metastatic disease.  No evidence of hypermetabolic liver metastasis.  No evidence of metastatic disease elsewhere.  Her case was discussed in GI tumor conference on 03/19/2024. Uncertainty remains regarding the second spot in the gastrohepatic ligament, which may be post-biopsy inflammation rather than cancer.   Genetic testing came back negative for any deleterious germline mutations.  Patient had consultation with Dr. 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  The location of this does appear to be relatively central.  It is unclear to me whether this is proximal enough in the body that would require a Whipple or a distal pancreas.  It is not well-seen on imaging.  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 We will start with neoadjuvant chemotherapy.  I will then get repeat imaging.  I do want a pancreatic protocol follow-up scan.   Plan for neoadjuvant chemotherapy with gemcitabine  and Abraxane , dose reduced for her age and comorbidities, followed by surgical evaluation.  Initially will plan for chemotherapy on days 1, 15 of 28-day cycle and if well-tolerated, we will attempt to do chemo on days 1, 8, 15 of 28-day cycle.  We started with 20% dose reduction of gemcitabine  and Abraxane .  Increased to standard dosing from cycle 2 onwards.  She has been tolerating chemotherapy reasonably well without major side effects.  Had mild nausea for couple of days after chemotherapy but no other major issues.  Labs reveal no dose-limiting toxicities today.  Will proceed with cycle 3 day 1 of gemcitabine  and Abraxane  today at standard strength.  Plan to continue chemotherapy at standard strength as long as she tolerates it.  We will increase the frequency of chemotherapy to days 1, 8, 15 of 28-day cycle from cycle 3 onwards, since she is tolerating chemotherapy well.  Following completion of cycle 3, we will obtain CT pancreatic protocol for restaging.  Request placed today.  RTC in 1 week for cycle 3-day 8 of chemotherapy.

## 2024-06-06 NOTE — Patient Instructions (Signed)

## 2024-06-06 NOTE — Patient Instructions (Signed)
 [facility] - A DEPT OF [facility]   Discharge Instructions: Thank you for choosing [facility] to provide your oncology and hematology care.   If you have a lab appointment with the Cancer Center, please go directly to the Cancer Center and check in at the registration area.   Wear comfortable clothing and clothing appropriate for easy access to any Portacath or PICC line.   We strive to give you quality time with your provider. You may need to reschedule your appointment if you arrive late (15 or more minutes).  Arriving late affects you and other patients whose appointments are after yours.  Also, if you miss three or more appointments without notifying the office, you may be dismissed from the clinic at the provider's discretion.      For prescription refill requests, have your pharmacy contact our office and allow 72 hours for refills to be completed.    Today you received the following chemotherapy and/or immunotherapy agents: Paclitaxel protein-bound (Abraxane) and gemcitabine (Gemzar)      To help prevent nausea and vomiting after your treatment, we encourage you to take your nausea medication as directed.  BELOW ARE SYMPTOMS THAT SHOULD BE REPORTED IMMEDIATELY: *FEVER GREATER THAN 100.4 F (38 C) OR HIGHER *CHILLS OR SWEATING *NAUSEA AND VOMITING THAT IS NOT CONTROLLED WITH YOUR NAUSEA MEDICATION *UNUSUAL SHORTNESS OF BREATH *UNUSUAL BRUISING OR BLEEDING *URINARY PROBLEMS (pain or burning when urinating, or frequent urination) *BOWEL PROBLEMS (unusual diarrhea, constipation, pain near the anus) TENDERNESS IN MOUTH AND THROAT WITH OR WITHOUT PRESENCE OF ULCERS (sore throat, sores in mouth, or a toothache) UNUSUAL RASH, SWELLING OR PAIN  UNUSUAL VAGINAL DISCHARGE OR ITCHING   Items with * indicate a potential emergency and should be followed up as soon as possible or go to the Emergency Department if any problems should  occur.  Please show the CHEMOTHERAPY ALERT CARD or IMMUNOTHERAPY ALERT CARD at check-in to the Emergency Department and triage nurse.  Should you have questions after your visit or need to cancel or reschedule your appointment, please contact [facility] - A DEPT OF [facility]  Dept: [phone]  and follow the prompts.  Office hours are 8:00 a.m. to 4:30 p.m. Monday - Friday. Please note that voicemails left after 4:00 p.m. may not be returned until the following business day.  We are closed weekends and major holidays. You have access to a nurse at all times for urgent questions. Please call the main number to the clinic Dept: [phone] and follow the prompts.   For any non-urgent questions, you may also contact your provider using MyChart. We now offer e-Visits for anyone 24 and older to request care online for non-urgent symptoms. For details visit [email or web address].   Also download the MyChart app! Go to the app store, search MyChart, open the app, select [facility], and log in with your MyChart username and password.

## 2024-06-11 ENCOUNTER — Ambulatory Visit: Admitting: Neurology

## 2024-06-11 ENCOUNTER — Encounter: Payer: Self-pay | Admitting: Neurology

## 2024-06-11 VITALS — BP 120/49 | HR 89 | Ht 62.0 in | Wt 211.6 lb

## 2024-06-11 DIAGNOSIS — G4733 Obstructive sleep apnea (adult) (pediatric): Secondary | ICD-10-CM | POA: Diagnosis not present

## 2024-06-11 DIAGNOSIS — Z87898 Personal history of other specified conditions: Secondary | ICD-10-CM | POA: Diagnosis not present

## 2024-06-11 DIAGNOSIS — R4189 Other symptoms and signs involving cognitive functions and awareness: Secondary | ICD-10-CM

## 2024-06-11 NOTE — Progress Notes (Signed)
 Subjective:    Patient ID: Gina Key is a 78 y.o. female.  HPI    Interim history:   Gina Key is a 78 year old female with an underlying complex medical history of reflux disease, allergies, history of migraines, history of seizure-like activity (on Keppra ), hyperlipidemia, arthritis, hypertension, OSA, recent diagnosis of pancreatic cancer, followed by oncology, and obesity, who presents for follow-up consultation of her sleep apnea, seizure-like activity and cognitive complaints.  The patient is accompanied by her daughter today.    She was last seen in our clinic by Lauraine Born, NP in May 2025, at which time her MMSE was 21/30.  She was not on PAP therapy at the time but was willing to restart it.  She was advised to continue with Keppra  500 mg twice daily.  She did not wish to add any more medication at the time and management of vascular risk factors was discussed.  Today, 06/11/2024: I reviewed her AutoPap compliance data from the past year.  She has not used her machine since June 2025.  She used it a few days in June and a few days in January 2025. She reports that she is going through chemotherapy.  She tolerates it thankfully.  Per daughter, she has a scan coming up in early December to recheck on the status of the pancreatic cancer and how it is responding to treatment.  She has regular blood work including CBC and CMP, last test results from 06/06/2024 were reviewed in her electronic chart.  She does have anemia but white cell count is normal.  She continues to take Keppra  and reports no recent seizure activity.  She is willing to try AutoPap again and turned the humidity off completely.  She still feels that water accumulates in the tube and the mask when she tries it.  Overall feels stable and has no new complaints today.   Previously:   12/04/2023 Gina Born, NP): <<Saw Dr. Buck September 2024, for memory loss.  Was not using her CPAP machine at the time.  No new  memory medication started.  Maintained on Keppra  for seizure prevention. Does not want to use CPAP anymore, claims it smothers her. Had PE in March, since then decline in mobility. Claims got new CPAP machine in August 2023. Has full face mask. Claims water in the tubing, she is afraid of water. Doesn't sleep well, toss and turn at night. Often has morning headache. Feels memory has declined some, is forgetful, poor short term. Her daughter lives with her for 2 years. Misplace things, see something on facebook think she knows them. Does her own ADLs. Gina Key manages medication. Uses cane. She is not concerned about her memory. Is on Eliquis . No seizures, remains on Keppra  500 mg twice daily. Claims has taken CPAP machine to DME twice and settings changed.   >>  04/23/2023 (SA): She saw Lauraine Born, NP in a virtual visit on 10/17/2022. She reports not having any major issues with her memory, sometimes she is forgetful.  Her daughter reports that she has herself not noticed much in the way of problem with her mom's memory but that patient's younger sister thought there was some concern about her forgetfulness and sisters husband has dementia so she mentioned it to them.  Patient reports that she continues to take Keppra  without any side effects, she is also on Topamax  for headaches.  She has not had any major medication changes.  Daughter reports that sometimes her balance is not good, she  has not fallen recently.  She admits that she does not always hydrate well but has become better with her water intake.  She lives with daughter, daughter's husband and daughters 2 boys as well as daughter's grandchild.  She does not drink any alcohol.  She is a non-smoker. She saw her PCP, Dr. Mercer on 03/05/2023, I reviewed the office visit note.  Her MoCA score was 13 at the time.     She had a head CT without contrast on 03/16/2023 and I reviewed the results:    IMPRESSION: 1. No evidence of acute intracranial  abnormality. 2. Mild chronic small vessel ischemic disease.     She had blood work on 03/05/2023 and I reviewed the results in her epic chart.  Vitamin B12 was on the lower end of normal at 355, folate normal at 8.2, CMP showed benign findings with sodium of 144, potassium 3.9, BUN 13, creatinine 0.9, alk phos 117 which was borderline normal on the higher end, AST 19, ALT 14, CBC with differential and platelets benign without any evidence of anemia.  TSH normal at 1.07, free T4 normal at 0.64, vitamin D  on the lower end of normal at 41.57.       She admits that she has not been using her AutoPap machine.  Her latest 90-day download shows no usage since 02/03/2023.  She was quite good with her compliance in the month of June through mid July.  She reports that it feels like she is smothering from the air.  She uses a fullface mask.  She is agreeable to restarting treatment with a modified pressure, new supplies and also with humidity reduction as she has had instances of water accumulating in the mask.   10/17/2022 (SS, MyChart phone visit): <<Here today for follow-up via telephone visit, we could not connect virtually.  She is accompanied by her daughter.  Review of CPAP data 09/16/2022-10/11/2022 shows suboptimal usage 10/30 days at 33%, greater than 4 hours 30%.  Average usage days used 8 hours 11 minutes.  Minimum pressure 5, maximum pressure 11 cm water.  EPR level 3.  Leak 1.2, AHI 0.6.  Indicates her usage has been poor due to travel, taking care of sick family members.  Prior in January she cannot use CPAP because she was waiting for a new mask.  She is using full face.  She does think she benefits from CPAP.  She is motivated to get back to consistent use.  She denies any seizures.  Remains on Keppra  without side effect.>>   04/11/2022 (SA): I reviewed her AutoPap compliance data from 03/11/2022 through 04/09/2022, which is a total of 30 days, during which time she used her machine 29 days with percent  use days greater than 4 hours at 80%, indicating excellent compliance with an average usage of 5 hours and 17 minutes, residual AHI at goal at 0.6/h, average pressure for the 95th percentile at 8 cm with a range of 5 to 11 cm with EPR.  Leak rather low with the 95th percentile at 0.8 L/min.  She reports doing fairly well with the machine, she is certainly motivated to continue with treatment and is compliant with it but has had some trouble lately with the display of the machine and with the comfort of the mask.  She is using a medium Siesta 3B fullface mask, she also has a small insert but was told that the small is too small for her.  But the looks of it, the  small seems to be a better size for her from my standpoint but she does have an appointment to discuss mask and machine issues with her DME provider.  She also has had instances of water in the tubing which was uncomfortable as it rose into the mask.  She has had recent stress, her son has been hospitalized for unresponsiveness and seizure as I understand.  Understandably, she is worried about him.  She has not had any further episodes of unresponsiveness or seizure-like activity herself.  She continues to take Keppra  500 mg twice daily.  She does not always hydrate well with water, per daughter.  Patient estimates that she drinks about 2 bottles of water per day.  She likes to drink V8 energy drink, 1 can/day and occasional soda.  Her daytime energy has improved but she does doze off sometimes according to the daughter.   I first met her at the request of her primary care physician on 12/05/2021, at which time she reported a prior diagnosis of obstructive sleep apnea.  She was no longer on positive airway pressure treatment.  She was advised to proceed with a sleep study.  I saw her for a new problem visit on 12/13/2021 for an episode of unresponsiveness.  She had presented to the emergency room and had been started on Keppra .  We proceeded with an EEG.  She  had an EEG on 12/15/2021 and I reviewed the results:  Impression: This is a normal EEG recording in the waking and drowsy state. No evidence of interictal epileptiform discharges seen. A normal EEG does not exclude a diagnosis of epilepsy.     She was notified via MyChart message.   She had a home sleep test on 01/17/2022 which indicated overall mild obstructive sleep apnea with a total AHI of 8.2/hour and O2 nadir of 87%. Snoring was detected, and appeared to be mild to moderate, intermittent.  She was advised to proceed with AutoPap therapy.  Her set up date was 02/23/2022.  She has a ResMed AirSense 11 AutoSet machine.       12/13/21: (She) presents for a new problem visit for a suspected seizure.  She is referred by the emergency room.  I recently saw her on 12/05/2021 at the request of her primary care physician for evaluation of her sleep apnea.  The patient is accompanied by her daughter today.  She presented to the emergency room on 12/11/2021 via EMS.  She she was found lying on the floor on her side, she appeared confused, no convulsion or twitching was seen.  No evidence of tongue bite or urinary incontinence or bowel incontinence.  I reviewed the emergency room records from 12/11/2021.  She had a CT head without contrast and cervical spine CT without contrast on 12/11/2021 and I reviewed the results: IMPRESSION: 1. No evidence of acute intracranial abnormality. 2. No static evidence of acute injury to the cervical spine. Degenerative changes as described.   She has been given IV Keppra  2 g and and started on oral Keppra  500 mg twice daily.  Laboratory testing showed sodium of 141, potassium on the lower end at 3.6, chloride 116, slightly elevated, glucose 107, BUN 11, creatinine 0.94, normal AST and ALT at 26 and 18, respectively.  CBC with differential and platelet benign with the exception of borderline low hemoglobin at 11.8.  Magnesium was normal at 2.0.   Of note, she had a brain MRI without  contrast through [facility] on 03/04/2021  and I reviewed the results: Impression: 1. No acute intracranial process. No acute infarct.  2. Age-related cerebral atrophy and chronic small vessel white matter disease.    She had presented to the emergency room with at [facility] in [place], [place] on 03/04/2021 with a chief complaint of memory loss and having bitten her tongue in her sleep.  She reported a memory lapse.  She also reported recurrent headaches for about 3 weeks.  She had a head CT without contrast on 03/04/2021 and I reviewed the results: Impression: No acute intracranial abnormality.     She reports feeling okay, no new complaints, daughter feels that she is not back to normal yet, she seems sluggish in her thinking and confused at times.  She has not had any bruising and no injuries, daughter reports that she found her in a small space at the foot end of her bed on the floor mumbling and fidgeting with her walker.  She had not hurt herself against the foot end of the bed or sustaining any bruising.  Patient does not recall falling.  She does not recall feeling lightheaded or dizzy or nauseated.  She was getting dressed in the morning and her daughter checked on her to see what she would like for breakfast.  Shortly before patient had a conversation with the son-in-law in the kitchen.  She has had some milder recurrent headaches.  She admits that she does not hydrate well with water, estimates that she drinks 1 or 2 bottles of water per day.  She drinks occasional soda, no coffee.  She sustained no tongue bite and no bladder or bowel incontinence.  She is on Keppra  500 mg twice daily.     12/05/21: (She) was previously diagnosed with obstructive sleep apnea and placed on positive airway pressure treatment.  She is currently no longer on a CPAP or similar machine, prior testing was several years ago and test results are not available for my review today. Testing was  in [place]. I reviewed your office note from 09/28/2021.  Her Epworth sleepiness score is 0/24, fatigue severity score is 30 out of 63.  She reports that she used her machine in the beginning, at least for a few months but then the machine broke.  She reports that she had a nightmare and she threw the machine.  She reports a family history of sleep apnea. Her sister, one son and her other daughter have OSA. She lives with Gina Key, she has a total of 6 kids. She is widowed. She would be willing to get retested and start PAP therapy again. Her bedtime is around 11-11:30 PM and rise time is around 5:30 AM, she does not sleep through the night.  She denies recurrent nocturnal or morning headaches.  She has nocturia about once per average night.  She is working on weight loss, weight has been fluctuating.  She is a non-smoker and does not utilize alcohol and does not drink caffeine daily.  She often wakes up in the early morning hours and has trouble falling asleep but may doze off for a few more hours.     Her Past Medical History Is Significant For: Past Medical History:  Diagnosis Date   Acid reflux    Allergy    Anxiety    DDD (degenerative disc disease), lumbar    Depression    Facet joint disease    GERD (gastroesophageal reflux disease)    High cholesterol    Hypertension  Low vitamin D  level    OSA (obstructive sleep apnea)    Primary osteoarthritis involving multiple joints    Pulmonary embolism (HCC)    Seizures (HCC)    Sleep apnea    C PAP    Her Past Surgical History Is Significant For: Past Surgical History:  Procedure Laterality Date   ABDOMINAL HYSTERECTOMY     BREAST BIOPSY Right    benign   CHOLECYSTECTOMY     ESOPHAGOGASTRODUODENOSCOPY N/A 03/03/2024   Procedure: EGD (ESOPHAGOGASTRODUODENOSCOPY);  Surgeon: Wilhelmenia Aloha Raddle., MD;  Location: [facility];  Service: Gastroenterology;  Laterality: N/A;   EUS N/A 03/03/2024   Procedure: ULTRASOUND, UPPER GI TRACT,  ENDOSCOPIC;  Surgeon: Wilhelmenia Aloha Raddle., MD;  Location: [facility];  Service: Gastroenterology;  Laterality: N/A;   FINE NEEDLE ASPIRATION  03/03/2024   Procedure: FINE NEEDLE ASPIRATION;  Surgeon: Wilhelmenia, Aloha Raddle., MD;  Location: [facility];  Service: Gastroenterology;;   IR IMAGING GUIDED PORT INSERTION  03/31/2024   left and right rotator cuff      Her Family History Is Significant For: Family History  Problem Relation Age of Onset   Pancreatic cancer Mother    Thyroid  cancer Sister    Colon polyps Daughter    Kidney cancer Daughter    Sleep apnea Daughter    Breast cancer Maternal Aunt 71   Seizures Paternal Grandmother    Cancer Brother        tongue   Sleep apnea Son    Stroke Son    Seizures Son    Sleep apnea Son    Seizures Grandson    Colon cancer Neg Hx    Crohn's disease Neg Hx    Esophageal cancer Neg Hx    Rectal cancer Neg Hx    Stomach cancer Neg Hx    Ulcerative colitis Neg Hx    Dementia Neg Hx     Her Social History Is Significant For: Social History   Socioeconomic History   Marital status: Widowed    Spouse name: Not on file   Number of children: 6   Years of education: 30   Highest education level: Not on file  Occupational History   Not on file  Tobacco Use   Smoking status: Never    Passive exposure: Never   Smokeless tobacco: Never  Vaping Use   Vaping status: Never Used  Substance and Sexual Activity   Alcohol use: Not Currently   Drug use: Never   Sexual activity: Not on file  Other Topics Concern   Not on file  Social History Narrative   Lives at home with daughter   Right handed   Caffeine: none    Social Drivers of Corporate Investment Banker Strain: Low Risk  (02/20/2024)   Overall Financial Resource Strain (CARDIA)    Difficulty of Paying Living Expenses: Not hard at all  Food Insecurity: No Food Insecurity (03/17/2024)   Hunger Vital Sign    Worried About Running Out of Food in the Last Year: Never true     Ran Out of Food in the Last Year: Never true  Transportation Needs: No Transportation Needs (03/17/2024)   PRAPARE - Administrator, Civil Service (Medical): No    Lack of Transportation (Non-Medical): No  Physical Activity: Inactive (02/20/2024)   Exercise Vital Sign    Days of Exercise per Week: 0 days    Minutes of Exercise per Session: 0 min  Stress: No Stress Concern Present (02/20/2024)  [employer] of Occupational Health - Occupational Stress Questionnaire    Feeling of Stress: Not at all  Social Connections: Moderately Integrated (02/20/2024)   Social Connection and Isolation Panel    Frequency of Communication with Friends and Family: More than three times a week    Frequency of Social Gatherings with Friends and Family: More than three times a week    Attends Religious Services: More than 4 times per year    Active Member of [employer] or Organizations: Yes    Attends Banker Meetings: More than 4 times per year    Marital Status: Widowed    Her Allergies Are:  No Known Allergies:   Her Current Medications Are:  Outpatient Encounter Medications as of 06/11/2024  Medication Sig   atorvastatin  (LIPITOR) 40 MG tablet    ELIQUIS  5 MG TABS tablet TAKE 1 TABLET BY MOUTH TWICE A DAY   ferrous sulfate 325 (65 FE) MG tablet Take 325 mg by mouth at bedtime.   Fexofenadine HCl (ALLEGRA PO) Take by mouth.   Incontinence Supply Disposable (WINGS HL ADULT BRIEFS/XL) MISC As needed daily.   ipratropium (ATROVENT ) 0.06 % nasal spray PLACE 2 SPRAYS INTO BOTH NOSTRILS 4 TIMES DAILY.   levETIRAcetam  (KEPPRA ) 500 MG tablet Take 1 tablet (500 mg total) by mouth 2 (two) times daily.   lidocaine  (HM LIDOCAINE  PATCH) 4 % Place 1 patch onto the skin daily as needed.   lidocaine -prilocaine  (EMLA ) cream Apply to affected area once   lipase/protease/amylase (CREON ) 36000 UNITS CPEP capsule Take 2 capsules (72,000 Units total) by mouth 3 (three) times daily with meals. May  also take 1 capsule (36,000 Units total) as needed (with snacks - up to 4 snacks daily).   losartan  (COZAAR ) 100 MG tablet TAKE 1 TABLET BY MOUTH EVERY DAY   metoprolol  succinate (TOPROL -XL) 25 MG 24 hr tablet TAKE 1 TABLET (25 MG TOTAL) BY MOUTH DAILY.   montelukast  (SINGULAIR ) 10 MG tablet TAKE 1 TABLET BY MOUTH EVERYDAY AT BEDTIME   omeprazole (PRILOSEC) 20 MG capsule TAKE 1 CAPSULE EVERY DAY   ondansetron  (ZOFRAN ) 8 MG tablet Take 1 tablet (8 mg total) by mouth every 8 (eight) hours as needed for nausea or vomiting.   oxyCODONE  (ROXICODONE ) 5 MG immediate release tablet Take 1-2 tablets (5-10 mg total) by mouth every 12 (twelve) hours as needed for severe pain (pain score 7-10) or moderate pain (pain score 4-6) (1 tablet for moderate pain, up to 2 tablets for severe).   potassium chloride  (KLOR-CON ) 10 MEQ tablet TAKE 1 TABLET BY MOUTH EVERY DAY   prochlorperazine  (COMPAZINE ) 10 MG tablet Take 1 tablet (10 mg total) by mouth every 6 (six) hours as needed for nausea or vomiting.   rosuvastatin  (CRESTOR ) 20 MG tablet Take 1 tablet (20 mg total) by mouth daily.   simethicone (MYLICON) 125 MG chewable tablet Chew 125 mg by mouth every 6 (six) hours as needed for flatulence.   spironolactone  (ALDACTONE ) 25 MG tablet TAKE 1 TABLET (25 MG TOTAL) BY MOUTH DAILY.   topiramate  (TOPAMAX ) 100 MG tablet TAKE 1 TABLET BY MOUTH TWICE A DAY   Facility-Administered Encounter Medications as of 06/11/2024  Medication   lidocaine  (XYLOCAINE ) 1 % (with pres) injection 1 mL   lidocaine  (XYLOCAINE ) 1 % (with pres) injection 4 mL   triamcinolone  acetonide (KENALOG -40) injection 40 mg   triamcinolone  acetonide (KENALOG -40) injection 40 mg  :  Review of Systems:  Out of a complete 14 point review of systems,  all are reviewed and negative with the exception of these symptoms as listed below:   Review of Systems  Objective:  Neurological Exam  Physical Exam Physical Examination:   Vitals:   06/11/24 0749   BP: (!) 120/49  Pulse: 89    General Examination: The patient is a very pleasant 78 y.o. female in no acute distress. She appears well-developed and well-nourished and well groomed.  She is situated in a wheelchair.  HEENT: Normocephalic, atraumatic, pupils are equal, round and reactive to light, corrective eyeglasses in place, tracking well-preserved, no nystagmus.  Face is symmetric in the upper part of the face and I did not remove her protective mask today (due to being on chemotherapy).  Neck is supple with full range of passive and active motion. There are no carotid bruits on auscultation.    Chest: Clear to auscultation without wheezing, rhonchi or crackles noted.  Unremarkable Port-A-Cath site right upper chest.   Heart: S1+S2+0, regular and normal without murmurs, rubs or gallops noted.    Abdomen: Soft, non-tender and non-distended.   Extremities: There is no obvious edema.      Skin: Warm and dry without trophic changes noted.    Musculoskeletal: exam reveals no obvious joint deformities.    Neurologically:  Mental status: The patient is awake, alert and oriented in all 4 spheres. Her immediate and remote memory, attention, language skills and fund of knowledge are quite appropriate. There is no evidence of aphasia, agnosia, apraxia or anomia. Speech is clear with normal prosody and enunciation. Thought process is linear. Mood is normal and affect is normal.  Cranial nerves II - XII are as described above under HEENT exam.  Motor exam: Normal bulk, strength and tone is noted.  She is able to stand and pivot, she has normal fine motor control for age.   Cerebellar testing: No dysmetria or intention tremor.  Sensory exam: intact to light touch in the upper and lower extremities.  Gait, station and balance: She stood up without difficulty but I did not have her walk much, she was in a wheelchair today.     Assessment and Plan:  In summary, Gina Key is a very pleasant  78 year old female with an underlying complex medical history of reflux disease, allergies, history of migraines, history of seizure-like activity (on Keppra ), hyperlipidemia, arthritis, hypertension, OSA, recent diagnosis of pancreatic cancer, followed by oncology, and obesity, who presents for follow-up consultation of her sleep apnea, seizure-like activity and cognitive complaints.  She has not used her AutoPap since June 2025 and is encouraged to try it again without that humidity on.  I will write for new supplies.  She is currently on chemotherapy and is followed closely by oncology.  She is advised to follow-up routinely in our clinic in about 6 months to see the nurse practitioner.   She is advised to continue with Keppra  at the current dose.  I answered all the questions today and the patient and her daughter were in agreement.   I spent 30 minutes in total face-to-face time and in reviewing records during pre-charting, more than 50% of which was spent in counseling and coordination of care, reviewing test results, reviewing medications and treatment regimen and/or in discussing or reviewing the diagnosis of OSA, seizure disorder, the prognosis and treatment options. Pertinent laboratory and imaging test results that were available during this visit with the patient were reviewed by me and considered in my medical decision making (see chart for details).

## 2024-06-12 ENCOUNTER — Ambulatory Visit

## 2024-06-12 ENCOUNTER — Other Ambulatory Visit: Payer: Self-pay

## 2024-06-13 ENCOUNTER — Encounter: Payer: Self-pay | Admitting: Oncology

## 2024-06-13 ENCOUNTER — Other Ambulatory Visit: Payer: Self-pay

## 2024-06-13 ENCOUNTER — Inpatient Hospital Stay: Admitting: Dietician

## 2024-06-13 ENCOUNTER — Inpatient Hospital Stay: Admitting: Oncology

## 2024-06-13 ENCOUNTER — Inpatient Hospital Stay

## 2024-06-13 VITALS — BP 109/72 | HR 87 | Temp 98.5°F | Resp 16 | Ht 62.0 in | Wt 212.4 lb

## 2024-06-13 DIAGNOSIS — C259 Malignant neoplasm of pancreas, unspecified: Secondary | ICD-10-CM | POA: Diagnosis not present

## 2024-06-13 DIAGNOSIS — Z5111 Encounter for antineoplastic chemotherapy: Secondary | ICD-10-CM | POA: Diagnosis not present

## 2024-06-13 LAB — CMP (CANCER CENTER ONLY)
ALT: 53 U/L — ABNORMAL HIGH (ref 0–44)
AST: 48 U/L — ABNORMAL HIGH (ref 15–41)
Albumin: 4.3 g/dL (ref 3.5–5.0)
Alkaline Phosphatase: 94 U/L (ref 38–126)
Anion gap: 8 (ref 5–15)
BUN: 11 mg/dL (ref 8–23)
CO2: 22 mmol/L (ref 22–32)
Calcium: 9.5 mg/dL (ref 8.9–10.3)
Chloride: 109 mmol/L (ref 98–111)
Creatinine: 0.82 mg/dL (ref 0.44–1.00)
GFR, Estimated: >60 mL/min (ref >60)
Glucose, Bld: 127 mg/dL — ABNORMAL HIGH (ref 70–99)
Potassium: 3.9 mmol/L (ref 3.5–5.1)
Sodium: 139 mmol/L (ref 135–145)
Total Bilirubin: 0.2 mg/dL (ref 0.0–1.2)
Total Protein: 6.9 g/dL (ref 6.5–8.1)

## 2024-06-13 LAB — CBC WITH DIFFERENTIAL (CANCER CENTER ONLY)
Abs Immature Granulocytes: 0.02 K/uL (ref 0.00–0.07)
Basophils Absolute: 0 K/uL (ref 0.0–0.1)
Basophils Relative: 1 %
Eosinophils Absolute: 0 K/uL (ref 0.0–0.5)
Eosinophils Relative: 1 %
HCT: 30.6 % — ABNORMAL LOW (ref 36.0–46.0)
Hemoglobin: 9.6 g/dL — ABNORMAL LOW (ref 12.0–15.0)
Immature Granulocytes: 1 %
Lymphocytes Relative: 39 %
Lymphs Abs: 1.6 K/uL (ref 0.7–4.0)
MCH: 26.4 pg (ref 26.0–34.0)
MCHC: 31.4 g/dL (ref 30.0–36.0)
MCV: 84.3 fL (ref 80.0–100.0)
Monocytes Absolute: 0.1 K/uL (ref 0.1–1.0)
Monocytes Relative: 2 %
Neutro Abs: 2.4 K/uL (ref 1.7–7.7)
Neutrophils Relative %: 56 %
Platelet Count: 156 K/uL (ref 150–400)
RBC: 3.63 MIL/uL — ABNORMAL LOW (ref 3.87–5.11)
RDW: 13.8 % (ref 11.5–15.5)
WBC Count: 4.2 K/uL (ref 4.0–10.5)
nRBC: 0.5 % — ABNORMAL HIGH (ref 0.0–0.2)

## 2024-06-13 MED ORDER — PACLITAXEL PROTEIN-BOUND CHEMO INJECTION 100 MG
250.0000 mg | Freq: Once | INTRAVENOUS | Status: AC
  Administered 2024-06-13: 250 mg via INTRAVENOUS
  Filled 2024-06-13: qty 50

## 2024-06-13 MED ORDER — PROCHLORPERAZINE MALEATE 10 MG PO TABS
10.0000 mg | ORAL_TABLET | Freq: Once | ORAL | Status: AC
  Administered 2024-06-13: 10 mg via ORAL
  Filled 2024-06-13: qty 1

## 2024-06-13 MED ORDER — SODIUM CHLORIDE 0.9 % IV SOLN: INTRAVENOUS | Status: DC

## 2024-06-13 MED ORDER — SODIUM CHLORIDE 0.9 % IV SOLN
1000.0000 mg/m2 | Freq: Once | INTRAVENOUS | Status: AC
  Administered 2024-06-13: 2052 mg via INTRAVENOUS
  Filled 2024-06-13: qty 53.97

## 2024-06-13 NOTE — Assessment & Plan Note (Signed)
 She was seen in our rapid diagnostic clinic on 02/12/2024 for consultation and was referred to GI for EUS/biopsy.  Labs on that day showed a CA 19-9 of 8, CEA undetectable, chromogranin A increased at 224.8 ng/mL.  CBCD and CMP were unremarkable with normal LFTs.  On 03/03/2024 Dr. Wilhelmenia performed EGD and EUS.  On EUS, mass was identified in the pancreatic body.  Endosonographic appearance was highly suspicious for adenocarcinoma.  Staged as T1c, N0, MX.  FNA performed.  There was no sign of significant pathology in the CBD and in the common hepatic duct.  No malignant appearing lymph nodes were visualized in the celiac region, peripancreatic region and porta hepatis.  A cyst was found in the visualized portion of the liver measured 26 mm x 23 mm.  No evidence of any solid mass lesions in the left lobe of liver.  FNA from pancreatic mass came back positive for adenocarcinoma.  On 03/11/2024, staging PET scan showed ill-defined heterogeneous low-level hypermetabolic in the mid pancreas, corresponding to the abnormality on previous MRI.  Features compatible with biopsy-proven pancreatic adenocarcinoma.  16 x 11 mm focus of amorphous soft tissue in the gastrohepatic ligament was hypermetabolic and concerning for metastatic disease.  No evidence of hypermetabolic liver metastasis.  No evidence of metastatic disease elsewhere.  Her case was discussed in GI tumor conference on 03/19/2024. Uncertainty remains regarding the second spot in the gastrohepatic ligament, which may be post-biopsy inflammation rather than cancer.   Genetic testing came back negative for any deleterious germline mutations.  Patient had consultation with Dr. 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  The location of this does appear to be relatively central.  It is unclear to me whether this is proximal enough in the body that would require a Whipple or a distal pancreas.  It is not well-seen on imaging.  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 We will start with neoadjuvant chemotherapy.  I will then get repeat imaging.  I do want a pancreatic protocol follow-up scan.   Plan for neoadjuvant chemotherapy with gemcitabine  and Abraxane , dose reduced for her age and comorbidities, followed by surgical evaluation.  Initially will plan for chemotherapy on days 1, 15 of 28-day cycle and if well-tolerated, we will attempt to do chemo on days 1, 8, 15 of 28-day cycle.  We started with 20% dose reduction of gemcitabine  and Abraxane .  Increased to standard dosing from cycle 2 onwards.  She has been tolerating chemotherapy reasonably well without major side effects.  Had mild nausea for couple of days after chemotherapy but no other major issues.  Labs reveal no dose-limiting toxicities today.  Will proceed with cycle 3 day 2 of gemcitabine  and Abraxane  today at standard strength.  Plan to continue chemotherapy at standard strength as long as she tolerates it.  We will increase the frequency of chemotherapy to days 1, 8, 15 of 28-day cycle from cycle 3 onwards, since she is tolerating chemotherapy well.  Following completion of cycle 3, we will obtain CT pancreatic protocol for restaging.  This is currently scheduled on 06/23/2024.  RTC in 1 week for cycle 3-day 15 of chemotherapy.  I will see her again on 07/04/2024 when she is due for cycle 4-day 1 of chemotherapy.

## 2024-06-13 NOTE — Patient Instructions (Signed)
 [facility] - A DEPT OF [facility]   Discharge Instructions: Thank you for choosing [facility] to provide your oncology and hematology care.   If you have a lab appointment with the Cancer Center, please go directly to the Cancer Center and check in at the registration area.   Wear comfortable clothing and clothing appropriate for easy access to any Portacath or PICC line.   We strive to give you quality time with your provider. You may need to reschedule your appointment if you arrive late (15 or more minutes).  Arriving late affects you and other patients whose appointments are after yours.  Also, if you miss three or more appointments without notifying the office, you may be dismissed from the clinic at the provider's discretion.      For prescription refill requests, have your pharmacy contact our office and allow 72 hours for refills to be completed.    Today you received the following chemotherapy and/or immunotherapy agents: Paclitaxel protein-bound (Abraxane) and gemcitabine (Gemzar)      To help prevent nausea and vomiting after your treatment, we encourage you to take your nausea medication as directed.  BELOW ARE SYMPTOMS THAT SHOULD BE REPORTED IMMEDIATELY: *FEVER GREATER THAN 100.4 F (38 C) OR HIGHER *CHILLS OR SWEATING *NAUSEA AND VOMITING THAT IS NOT CONTROLLED WITH YOUR NAUSEA MEDICATION *UNUSUAL SHORTNESS OF BREATH *UNUSUAL BRUISING OR BLEEDING *URINARY PROBLEMS (pain or burning when urinating, or frequent urination) *BOWEL PROBLEMS (unusual diarrhea, constipation, pain near the anus) TENDERNESS IN MOUTH AND THROAT WITH OR WITHOUT PRESENCE OF ULCERS (sore throat, sores in mouth, or a toothache) UNUSUAL RASH, SWELLING OR PAIN  UNUSUAL VAGINAL DISCHARGE OR ITCHING   Items with * indicate a potential emergency and should be followed up as soon as possible or go to the Emergency Department if any problems should  occur.  Please show the CHEMOTHERAPY ALERT CARD or IMMUNOTHERAPY ALERT CARD at check-in to the Emergency Department and triage nurse.  Should you have questions after your visit or need to cancel or reschedule your appointment, please contact [facility] - A DEPT OF [facility]  Dept: [phone]  and follow the prompts.  Office hours are 8:00 a.m. to 4:30 p.m. Monday - Friday. Please note that voicemails left after 4:00 p.m. may not be returned until the following business day.  We are closed weekends and major holidays. You have access to a nurse at all times for urgent questions. Please call the main number to the clinic Dept: [phone] and follow the prompts.   For any non-urgent questions, you may also contact your provider using MyChart. We now offer e-Visits for anyone 24 and older to request care online for non-urgent symptoms. For details visit [email or web address].   Also download the MyChart app! Go to the app store, search MyChart, open the app, select [facility], and log in with your MyChart username and password.

## 2024-06-13 NOTE — Progress Notes (Signed)
 [facility]  ONCOLOGY CLINIC PROGRESS NOTE   Patient Care Team: [name], MD as PCP - General (Family Medicine) Mansouraty, Aloha Raddle., MD as Consulting Physician (Gastroenterology)  PATIENT NAME: Gina Key   MR#: [id number] DOB: 09-24-1945  Date of visit: 06/13/2024   ASSESSMENT & PLAN:   Gina Key is a 78 y.o.  lady with a past medical history of hypertension, history of pulmonary embolism in March 2025, seizure disorder, obstructive sleep apnea, arthritis, GERD, was referred to our clinic in August 2025 for pancreatic adenocarcinoma.   Pancreatic adenocarcinoma [facility]) She was seen in our rapid diagnostic clinic on 02/12/2024 for consultation and was referred to GI for EUS/biopsy.  Labs on that day showed a CA 19-9 of 8, CEA undetectable, chromogranin A increased at 224.8 ng/mL.  CBCD and CMP were unremarkable with normal LFTs.  On 03/03/2024 Dr. Wilhelmenia performed EGD and EUS.  On EUS, mass was identified in the pancreatic body.  Endosonographic appearance was highly suspicious for adenocarcinoma.  Staged as T1c, N0, MX.  FNA performed.  There was no sign of significant pathology in the CBD and in the common hepatic duct.  No malignant appearing lymph nodes were visualized in the celiac region, peripancreatic region and porta hepatis.  A cyst was found in the visualized portion of the liver measured 26 mm x 23 mm.  No evidence of any solid mass lesions in the left lobe of liver.  FNA from pancreatic mass came back positive for adenocarcinoma.  On 03/11/2024, staging PET scan showed ill-defined heterogeneous low-level hypermetabolic in the mid pancreas, corresponding to the abnormality on previous MRI.  Features compatible with biopsy-proven pancreatic adenocarcinoma.  16 x 11 mm focus of amorphous soft tissue in the gastrohepatic ligament was hypermetabolic and concerning for metastatic disease.  No evidence of hypermetabolic liver metastasis.   No evidence of metastatic disease elsewhere.  Her case was discussed in GI tumor conference on 03/19/2024. Uncertainty remains regarding the second spot in the gastrohepatic ligament, which may be post-biopsy inflammation rather than cancer.   Genetic testing came back negative for any deleterious germline mutations.  Patient had consultation with Dr. 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  The location of this does appear to be relatively central.  It is unclear to me whether this is proximal enough in the body that would require a Whipple or a distal pancreas.  It is not well-seen on imaging.  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 We will start with neoadjuvant chemotherapy.  I will then get repeat imaging.  I do want a pancreatic protocol follow-up scan.   Plan for neoadjuvant chemotherapy with gemcitabine  and Abraxane , dose reduced for her age and comorbidities, followed by surgical evaluation.  Initially will plan for chemotherapy on days 1, 15 of 28-day cycle and if well-tolerated, we will attempt to do chemo on days 1, 8, 15 of 28-day cycle.  We started with 20% dose reduction of gemcitabine  and Abraxane .  Increased to standard dosing from cycle 2 onwards.  She has been tolerating chemotherapy reasonably well without major side effects.  Had mild nausea for couple of days after chemotherapy but no other major issues.  Labs reveal no dose-limiting toxicities today.  Will proceed with cycle 3 day 2 of gemcitabine  and Abraxane  today at standard strength.  Plan to continue chemotherapy at standard strength as long as she  tolerates it.  We will increase the frequency of chemotherapy to days 1, 8, 15 of 28-day cycle from cycle 3 onwards, since she is tolerating chemotherapy well.  Following completion of cycle 3, we will obtain CT pancreatic protocol for restaging.  This is currently scheduled on  06/23/2024.  RTC in 1 week for cycle 3-day 15 of chemotherapy.  I will see her again on 07/04/2024 when she is due for cycle 4-day 1 of chemotherapy.  Chemotherapy-induced anemia Anemia secondary to chemotherapy with hemoglobin levels stable around 10 (9.6 today), slightly decreased from previous levels but within acceptable range. No immediate intervention required as long as hemoglobin remains above 8. - Continue to monitor hemoglobin levels.  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 Blood counts are well-managed. - Continue Eliquis  for anticoagulation. - Monitor blood counts regularly during chemotherapy.   I reviewed lab results and outside records for this visit and discussed relevant results with the patient. Diagnosis, plan of care and treatment options were also discussed in detail with the patient. Opportunity provided to ask questions and answers provided to her apparent satisfaction. Provided instructions to call our clinic with any problems, questions or concerns prior to return visit. I recommended to continue follow-up with PCP and sub-specialists. She verbalized understanding and agreed with the plan.   NCCN guidelines have been consulted in the planning of this patient's care.  I spent a total of 42 minutes during this encounter with the patient including review of chart and various tests results, discussions about plan of care and coordination of care plan.   [name], MD  06/13/2024 12:00 PM  [facility] - A DEPT OF [facility] [address] [place] [address] Dept: [phone] Dept Fax: [phone]    CHIEF COMPLAINT/ REASON FOR VISIT:   Adenocarcinoma of pancreas, Stage I vs stage II  Current Treatment: Plan for neoadjuvant chemotherapy with dose reduce gemcitabine  and Abraxane , followed by surgical evaluation.  Started  systemic treatment with gemcitabine  and Abraxane  from 04/11/2024.  INTERVAL HISTORY:    Discussed the use of AI scribe software for clinical note transcription with the patient, who gave verbal consent to proceed.  History of Present Illness  Gina Key is a 78 year old female undergoing chemotherapy who presents for a follow-up visit.  She is currently in the second week of her chemotherapy treatment cycle, which follows a schedule of three weeks on and one week off. She is in the third month of a planned four to six-month regimen.  She experiences a slightly decreased appetite, noting that sometimes her stomach cannot handle a full meal. She tries to eat as much as she can and sometimes finishes her meals the next day if she lacks appetite in the evening.  Recent blood work shows stable results with a hemoglobin level of 9.6, slightly decreased from 9.8 the previous week, but within the expected range during chemotherapy. Other blood parameters, including white blood cells and platelets, are normal.   I have reviewed the past medical history, past surgical history, social history and family history with the patient and they are unchanged from previous note.  HISTORY OF PRESENT ILLNESS:   ONCOLOGY HISTORY:   On review of the previous records patient was evaluated in the emergency department on 02/05/2024 for gross hematuria.  CT abdomen pelvis was performed which showed moderate dilation of the pancreatic duct in the mid  body and tail overlying and parenchymal atrophy. MR abdomen was ordered by PCP Dr. Mercer and performed on 02/07/24. MRI showed a focal segment of pancreatic ductal dilatation with abrupt caliber change and stricture. Subtle mass lesion in this location identified measuring 17 x 15 mm at the level of the caliber change and has appearance worrisome for neoplasm such as an adenocarcinoma. Image did not show any other areas concerning for malignant disease.    CMP  collected 02/05/24 in the ED was overall unremarkable. Further chart review shows patient was diagnosed with a submassive PE with right heart strain 10/05/23 and is currently taking Eliquis .     She is retired, having worked in associate professor. She does not smoke or drink alcohol. She currently lives with her daughter due to past seizures. Her last colonoscopy in February 2024 was normal, as was her last mammogram. Her family history is significant for her mother having pancreatic cancer at age 69, her brother having throat cancer, and her daughter having kidney cancer.   She was seen in our rapid diagnostic clinic on 02/12/2024 for consultation and was referred to GI for EUS/biopsy.  Labs on that day showed a CA 19-9 of 8, CEA undetectable, chromogranin A increased at 224.8 ng/mL.  CBCD and CMP were unremarkable with normal LFTs.   On 03/03/2024 Dr. Wilhelmenia performed EGD and EUS.  On EUS, mass was identified in the pancreatic body.  Endosonographic appearance was highly suspicious for adenocarcinoma.  Staged as T1c, N0, MX.  FNA performed.  There was no sign of significant pathology in the CBD and in the common hepatic duct.  No malignant appearing lymph nodes were visualized in the celiac region, peripancreatic region and porta hepatis.  A cyst was found in the visualized portion of the liver measured 26 mm x 23 mm.  No evidence of any solid mass lesions in the left lobe of liver.   FNA from pancreatic mass came back positive for adenocarcinoma.   On 03/11/2024, staging PET scan showed ill-defined heterogeneous low-level hypermetabolic in the mid pancreas, corresponding to the abnormality on previous MRI.  Features compatible with biopsy-proven pancreatic adenocarcinoma.  16 x 11 mm focus of amorphous soft tissue in the gastrohepatic ligament was hypermetabolic and concerning for metastatic disease.  No evidence of hypermetabolic liver metastasis.  No evidence of metastatic disease elsewhere.   Her  case was discussed in GI tumor conference on 03/19/2024. Uncertainty remains regarding the second spot in the gastrohepatic ligament, which may be post-biopsy inflammation rather than cancer.   Genetic testing came back negative for any deleterious germline mutations.  Patient had consultation with Dr. 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  The location of this does appear to be relatively central.  It is unclear to me whether this is proximal enough in the body that would require a Whipple or a distal pancreas.  It is not well-seen on imaging.  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 We will start with neoadjuvant chemotherapy.  I will then get repeat imaging.  I do want a pancreatic protocol follow-up scan.   Plan for neoadjuvant chemotherapy with gemcitabine  and Abraxane , dose reduced for her age and comorbidities, followed by surgical evaluation.  Started systemic treatment with gemcitabine  and Abraxane  from 04/11/2024.  Oncology History  Pancreatic adenocarcinoma (HCC)  03/03/2024 Initial Diagnosis   Pancreatic adenocarcinoma (HCC)   03/21/2024 Cancer Staging   Staging  form: Exocrine Pancreas, AJCC 8th Edition - Clinical: Stage IIB (cT1c, cN1, cM0) - Signed by Autumn Millman, MD on 04/11/2024   03/31/2024 Genetic Testing   Negative genetic testing. No pathogenic variants identified on the [facility] CancerNext-Expanded+RNA Panel. The report date is 03/31/2024.   The CancerNext-Expanded gene panel offered by [facility] and includes sequencing, rearrangement, and RNA analysis for the following 77 genes: AIP, ALK, APC, ATM, AXIN2, BAP1, BARD1, BMPR1A, BRCA1, BRCA2, BRIP1, CDC73, CDH1, CDK4, CDKN1B, CDKN2A, CEBPA, CHEK2, CTNNA1, DDX41, DICER1, ETV6, FH, FLCN, GATA2, LZTR1, MAX, MBD4, MEN1, MET, MLH1, MSH2, MSH3, MSH6, MUTYH, NF1, NF2, NTHL1, PALB2, PHOX2B, PMS2, POT1, PRKAR1A, PTCH1, PTEN, RAD51C, RAD51D, RB1,  RET, RPS20, RUNX1, SDHA, SDHAF2, SDHB, SDHC, SDHD, SMAD4, SMARCA4, SMARCB1, SMARCE1, STK11, SUFU, TMEM127, TP53, TSC1, TSC2, VHL, and WT1 (sequencing and deletion/duplication); EGFR, HOXB13, KIT, MITF, PDGFRA, POLD1, and POLE (sequencing only); EPCAM and GREM1 (deletion/duplication only).    04/11/2024 -  Chemotherapy   Patient is on Treatment Plan : PANCREATIC Abraxane  D1,8,15 + Gemcitabine  D1,8,15 q28d         REVIEW OF SYSTEMS:   Review of Systems - Oncology  All other pertinent systems were reviewed with the patient and are negative.  ALLERGIES: She has no known allergies.  MEDICATIONS:  Current Outpatient Medications  Medication Sig Dispense Refill   atorvastatin  (LIPITOR) 40 MG tablet      ELIQUIS  5 MG TABS tablet TAKE 1 TABLET BY MOUTH TWICE A DAY 180 tablet 1   ferrous sulfate 325 (65 FE) MG tablet Take 325 mg by mouth at bedtime.     Fexofenadine HCl (ALLEGRA PO) Take by mouth.     Incontinence Supply Disposable (WINGS HL ADULT BRIEFS/XL) MISC As needed daily. 100 each 11   ipratropium (ATROVENT ) 0.06 % nasal spray PLACE 2 SPRAYS INTO BOTH NOSTRILS 4 TIMES DAILY. 15 mL 1   levETIRAcetam  (KEPPRA ) 500 MG tablet Take 1 tablet (500 mg total) by mouth 2 (two) times daily. 180 tablet 3   lidocaine  (HM LIDOCAINE  PATCH) 4 % Place 1 patch onto the skin daily as needed.     lidocaine -prilocaine  (EMLA ) cream Apply to affected area once 30 g 3   lipase/protease/amylase (CREON ) 36000 UNITS CPEP capsule Take 2 capsules (72,000 Units total) by mouth 3 (three) times daily with meals. May also take 1 capsule (36,000 Units total) as needed (with snacks - up to 4 snacks daily). 300 capsule 11   losartan  (COZAAR ) 100 MG tablet TAKE 1 TABLET BY MOUTH EVERY DAY 90 tablet 1   metoprolol  succinate (TOPROL -XL) 25 MG 24 hr tablet TAKE 1 TABLET (25 MG TOTAL) BY MOUTH DAILY. 90 tablet 1   montelukast  (SINGULAIR ) 10 MG tablet TAKE 1 TABLET BY MOUTH EVERYDAY AT BEDTIME 90 tablet 1   omeprazole (PRILOSEC)  20 MG capsule TAKE 1 CAPSULE EVERY DAY 90 capsule 1   ondansetron  (ZOFRAN ) 8 MG tablet Take 1 tablet (8 mg total) by mouth every 8 (eight) hours as needed for nausea or vomiting. 30 tablet 1   oxyCODONE  (ROXICODONE ) 5 MG immediate release tablet Take 1-2 tablets (5-10 mg total) by mouth every 12 (twelve) hours as needed for severe pain (pain score 7-10) or moderate pain (pain score 4-6) (1 tablet for moderate pain, up to 2 tablets for severe). 90 tablet 0   potassium chloride  (KLOR-CON ) 10 MEQ tablet TAKE 1 TABLET BY MOUTH EVERY DAY 90 tablet 0   prochlorperazine  (COMPAZINE ) 10 MG tablet Take 1 tablet (10 mg total) by mouth every  6 (six) hours as needed for nausea or vomiting. 30 tablet 1   rosuvastatin  (CRESTOR ) 20 MG tablet Take 1 tablet (20 mg total) by mouth daily. 90 tablet 3   simethicone (MYLICON) 125 MG chewable tablet Chew 125 mg by mouth every 6 (six) hours as needed for flatulence.     spironolactone  (ALDACTONE ) 25 MG tablet TAKE 1 TABLET (25 MG TOTAL) BY MOUTH DAILY. 90 tablet 1   topiramate  (TOPAMAX ) 100 MG tablet TAKE 1 TABLET BY MOUTH TWICE A DAY 180 tablet 1   Current Facility-Administered Medications  Medication Dose Route Frequency Provider Last Rate Last Admin   lidocaine  (XYLOCAINE ) 1 % (with pres) injection 1 mL  1 mL Other Once        lidocaine  (XYLOCAINE ) 1 % (with pres) injection 4 mL  4 mL Other Once        triamcinolone  acetonide (KENALOG -40) injection 40 mg  40 mg Intramuscular Once        triamcinolone  acetonide (KENALOG -40) injection 40 mg  40 mg Intra-articular Once        Facility-Administered Medications Ordered in Other Visits  Medication Dose Route Frequency Provider Last Rate Last Admin   0.9 %  sodium chloride  infusion   Intravenous Continuous Kyleeann Cremeans, MD 10 mL/hr at 06/13/24 1027 New Bag at 06/13/24 1027   gemcitabine  (GEMZAR ) 2,052 mg in sodium chloride  0.9 % 250 mL chemo infusion  1,000 mg/m2 (Treatment Plan Recorded) Intravenous Once Aylani Spurlock,  MD 608 mL/hr at 06/13/24 1141 2,052 mg at 06/13/24 1141     VITALS:   Blood pressure 109/72, pulse 87, temperature 98.5 F (36.9 C), temperature source Temporal, resp. rate 16, height 5' 2 (1.575 m), weight 212 lb 6.4 oz (96.3 kg), SpO2 100%.  Wt Readings from Last 3 Encounters:  06/13/24 212 lb 6.4 oz (96.3 kg)  06/11/24 211 lb 9.6 oz (96 kg)  06/06/24 214 lb (97.1 kg)    Body mass index is 38.85 kg/m.       PHYSICAL EXAM:   Physical Exam Constitutional:      General: She is not in acute distress.    Appearance: Normal appearance.  HENT:     Head: Normocephalic and atraumatic.  Cardiovascular:     Rate and Rhythm: Normal rate.  Pulmonary:     Effort: Pulmonary effort is normal. No respiratory distress.  Abdominal:     General: There is no distension.  Neurological:     General: No focal deficit present.     Mental Status: She is alert and oriented to person, place, and time.  Psychiatric:        Mood and Affect: Mood normal.        Behavior: Behavior normal.       LABORATORY DATA:   I have reviewed the data as listed.  Results for orders placed or performed in visit on 06/13/24  CBC with Differential (Cancer Center Only)  Result Value Ref Range   WBC Count 4.2 4.0 - 10.5 K/uL   RBC 3.63 (L) 3.87 - 5.11 MIL/uL   Hemoglobin 9.6 (L) 12.0 - 15.0 g/dL   HCT 69.3 (L) 63.9 - 53.9 %   MCV 84.3 80.0 - 100.0 fL   MCH 26.4 26.0 - 34.0 pg   MCHC 31.4 30.0 - 36.0 g/dL   RDW 86.1 88.4 - 84.4 %   Platelet Count 156 150 - 400 K/uL   nRBC 0.5 (H) 0.0 - 0.2 %   Neutrophils Relative % 56 %  Neutro Abs 2.4 1.7 - 7.7 K/uL   Lymphocytes Relative 39 %   Lymphs Abs 1.6 0.7 - 4.0 K/uL   Monocytes Relative 2 %   Monocytes Absolute 0.1 0.1 - 1.0 K/uL   Eosinophils Relative 1 %   Eosinophils Absolute 0.0 0.0 - 0.5 K/uL   Basophils Relative 1 %   Basophils Absolute 0.0 0.0 - 0.1 K/uL   Immature Granulocytes 1 %   Abs Immature Granulocytes 0.02 0.00 - 0.07 K/uL  CMP  (Cancer Center only)  Result Value Ref Range   Sodium 139 135 - 145 mmol/L   Potassium 3.9 3.5 - 5.1 mmol/L   Chloride 109 98 - 111 mmol/L   CO2 22 22 - 32 mmol/L   Glucose, Bld 127 (H) 70 - 99 mg/dL   BUN 11 8 - 23 mg/dL   Creatinine 9.17 9.55 - 1.00 mg/dL   Calcium  9.5 8.9 - 10.3 mg/dL   Total Protein 6.9 6.5 - 8.1 g/dL   Albumin 4.3 3.5 - 5.0 g/dL   AST 48 (H) 15 - 41 U/L   ALT 53 (H) 0 - 44 U/L   Alkaline Phosphatase 94 38 - 126 U/L   Total Bilirubin 0.2 0.0 - 1.2 mg/dL   GFR, Estimated >39 >39 mL/min   Anion gap 8 5 - 15       RADIOGRAPHIC STUDIES:  Previous imaging studies, including staging studies reviewed.   CODE STATUS:  Code Status History     Date Active Date Inactive Code Status Order ID Comments User Context   10/05/2023 0213 10/06/2023 1942 Full Code [id number]  Lee Kingfisher, MD ED    Questions for Most Recent Historical Code Status (Order [id number])     Question Answer   By: Consent: discussion documented in EHR            No orders of the defined types were placed in this encounter.    Future Appointments  Date Time Provider Department Center  06/17/2024 11:30 AM Zenaida Reyes HERO, PT [facility]  06/21/2024  8:30 AM [facility] FLUSH [facility] None  06/21/2024  9:00 AM [facility] INFUSION [facility] None  06/23/2024  7:00 AM [facility]-CT 2 [facility]-CT [place]  07/04/2024  9:15 AM [facility] FLUSH [facility] None  07/04/2024  9:45 AM Zair Borawski, MD [facility] None  07/04/2024 10:45 AM [facility] INFUSION [facility] None  07/11/2024  8:15 AM [facility] FLUSH [facility] None  07/11/2024  9:00 AM [facility] INFUSION [facility] None  07/28/2024  2:40 PM Urbano Albright, MD [facility] CPR  02/25/2025  1:40 PM [facility]-ANNUAL WELLNESS VISIT [facility] [address]  06/10/2025  7:45 AM Gayland Lauraine PARAS, NP [facility] None      This document was completed utilizing speech recognition software. Grammatical errors, random word insertions, pronoun  errors, and incomplete sentences are an occasional consequence of this system due to software limitations, ambient noise, and hardware issues. Any formal questions or concerns about the content, text or information contained within the body of this dictation should be directly addressed to the provider for clarification.

## 2024-06-13 NOTE — Progress Notes (Signed)
 Arrived in infusion patient for nutrition follow-up. Spoke with infusion RN. Treatment finished early. RN offered to call RD to come early which patient politely declined. Reports doing well and did not need to see RD today. Will monitor weights and reschedule nutrition as indicated. Patient and daughter have contact information.

## 2024-06-16 ENCOUNTER — Other Ambulatory Visit: Payer: Self-pay | Admitting: Family Medicine

## 2024-06-16 DIAGNOSIS — J302 Other seasonal allergic rhinitis: Secondary | ICD-10-CM

## 2024-06-16 NOTE — Therapy (Addendum)
 " OUTPATIENT PHYSICAL THERAPY TREATMENT NOTE/RE-CERT/DISCHARGE   Patient Name: Gina Key MRN: [id number] DOB:[birth year], 78 y.o., female Today's Date: 06/17/2024  END OF SESSION:  PT End of Session - 06/17/24 1125     Visit Number 4    Number of Visits 8    Date for Recertification  06/17/24    Authorization Type [employer]    PT Start Time 1130    PT Stop Time 1210    PT Time Calculation (min) 40 min    Activity Tolerance Patient limited by fatigue;Patient tolerated treatment well    Behavior During Therapy Anxious             Past Medical History:  Diagnosis Date   Acid reflux    Allergy    Anxiety    DDD (degenerative disc disease), lumbar    Depression    Facet joint disease    GERD (gastroesophageal reflux disease)    High cholesterol    Hypertension    Low vitamin D  level    OSA (obstructive sleep apnea)    Primary osteoarthritis involving multiple joints    Pulmonary embolism (HCC)    Seizures (HCC)    Sleep apnea    C PAP   Past Surgical History:  Procedure Laterality Date   ABDOMINAL HYSTERECTOMY     BREAST BIOPSY Right    benign   CHOLECYSTECTOMY     ESOPHAGOGASTRODUODENOSCOPY N/A 03/03/2024   Procedure: EGD (ESOPHAGOGASTRODUODENOSCOPY);  Surgeon: Wilhelmenia Aloha Raddle., MD;  Location: [facility];  Service: Gastroenterology;  Laterality: N/A;   EUS N/A 03/03/2024   Procedure: ULTRASOUND, UPPER GI TRACT, ENDOSCOPIC;  Surgeon: Wilhelmenia Aloha Raddle., MD;  Location: [facility];  Service: Gastroenterology;  Laterality: N/A;   FINE NEEDLE ASPIRATION  03/03/2024   Procedure: FINE NEEDLE ASPIRATION;  Surgeon: Wilhelmenia Aloha Raddle., MD;  Location: [facility];  Service: Gastroenterology;;   IR IMAGING GUIDED PORT INSERTION  03/31/2024   left and right rotator cuff     Patient Active Problem List   Diagnosis Date Noted   Genetic testing 04/01/2024   Erosive gastritis 03/03/2024   Pancreatic adenocarcinoma (HCC) 03/03/2024   Mild cognitive  impairment 12/04/2023   Simple hepatic cyst 10/15/2023   Aortic atherosclerosis 10/15/2023   Disc disease, degenerative, lumbar or lumbosacral 10/15/2023   Acute pulmonary embolism (HCC) 10/05/2023   Hypernatremia 10/05/2023   Dyspnea on exertion 10/05/2023   Osteopenia 12/14/2022   Essential hypertension 09/28/2021   Gastroesophageal reflux disease 09/28/2021   Seasonal allergies 09/28/2021   History of migraine 09/28/2021   Urinary incontinence 09/28/2021   Hyperlipidemia 09/28/2021   History of seizure 09/28/2021   OSA (obstructive sleep apnea) 09/28/2021    PCP: [name], MD   REFERRING PROVIDER: Aron Shoulders, MD   REFERRING DIAG: DFM related to pancreatic CA  THERAPY DIAG:  Physical deconditioning  Unsteadiness on feet  Rationale for Evaluation and Treatment: Rehabilitation  ONSET DATE: chronic  SUBJECTIVE:   SUBJECTIVE STATEMENT:  Beginning to feel the effects of chemo on strength, energy and appetite.  PERTINENT HISTORY: None noted PAIN:  Are you having pain? No  PRECAUTIONS: active CA(pancreatic)  RED FLAGS: None   WEIGHT BEARING RESTRICTIONS: No  FALLS:  Has patient fallen in last 6 months? No    OCCUPATION: retired  PLOF: Independent  PATIENT GOALS: To improve my mobility  NEXT MD VISIT: TBD  OBJECTIVE:  Note: Objective measures were completed at Evaluation unless otherwise noted.  DIAGNOSTIC FINDINGS: none applicable to PT  PATIENT SURVEYS:  PSFS: THE PATIENT SPECIFIC FUNCTIONAL SCALE  Place score of 0-10 (0 = unable to perform activity and 10 = able to perform activity at the same level as before injury or problem)  Activity Date: 04/17/24 06/17/24   Standing from a chair  9 9   2.  Standing 5 min 5 10   3.  Walking 5 min 5 5   4.      Total Score 19/30 24/30     Total Score = Sum of activity scores/number of activities  Minimally Detectable Change: 3 points (for single activity); 2 points (for average  score)  [facility] (nd). The Patient Specific Functional Scale . Retrieved from [email or web address]     MUSCLE LENGTH: N/T  PALPATION: Not tested  LOWER EXTREMITY ROM:  Active ROM Right eval Left eval  Hip flexion    Hip extension    Hip abduction    Hip adduction    Hip internal rotation    Hip external rotation    Knee flexion    Knee extension    Ankle dorsiflexion    Ankle plantarflexion    Ankle inversion    Ankle eversion     (Blank rows = not tested)  LOWER EXTREMITY MMT:  MMT Right eval Left eval  Hip flexion    Hip extension    Hip abduction    Hip adduction    Hip internal rotation    Hip external rotation    Knee flexion    Knee extension    Ankle dorsiflexion    Ankle plantarflexion    Ankle inversion    Ankle eversion     (Blank rows = not tested)   FUNCTIONAL TESTS:  30 seconds chair stand test 6 reps; 06/17/24 7 reps no arms 2 MWT 17ft with cane   GAIT: Distance walked: 48ft x2 Assistive device utilized: Single point cane Level of assistance: Modified independence Comments: slow cadence                                                                                                                                TREATMENT: [facility] Adult PT Treatment:                                                DATE: 06/17/24 Therapeutic Exercise: Tandem standing from 4 in block 15s x2 B HHA  Therapeutic Activity: Assessment of goal progress, 30s chair stand test, 2 MWT and PSFS HEP review and update  [facility] Adult PT Treatment:  DATE: 05/21/24 Therapeutic Exercise: Seated FAQs 15/15 Seated ham curls YTB 15/15 Standing heel raises 15x Standing heel rocks 15x Standing marches 15x B Supine hip fallouts RTB 15x B, 15/15 Supine march 15/15 Supine ball press 10x B, 10/10 STS from airex 10x    [facility] Adult PT Treatment:                                                 DATE: 05/07/24 Therapeutic Exercise: Seated FAQs 15/15 Seated ham curls YTB 15/15 Standing heel raises 15x Standing heel rocks 15x Supine hip fallouts RTB 15x B, 15/15 Supine ball press 10x B, 10/10 STS from airex 10x   [facility] Adult PT Treatment:                                                DATE: 04/17/24 Eval and HEP Self Care: Additional minutes spent for educating on updated Therapeutic Home Exercise Program as well as comparing current status to condition at start of symptoms. This included exercises focusing on stretching, strengthening, with focus on eccentric aspects. Long term goals include an improvement in range of motion, strength, endurance as well as avoiding reinjury. Patient's frequency would include in 1-2 times a day, 3-5 times a week for a duration of 6-12 weeks. Proper technique shown and discussed handout in great detail. All questions were discussed and addressed.     PATIENT EDUCATION:  Education details: Discussed eval findings, rehab rationale and POC and patient is in agreement  Person educated: Patient Education method: Explanation and Handouts Education comprehension: verbalized understanding and needs further education  HOME EXERCISE PROGRAM: Access Code: [email or web address] URL: [email or web address] Date: 04/17/2024 Prepared by: Reyes Kohut  Exercises - Sit to Stand with Arms Crossed  - 2 x daily - 5 x weekly - 1 sets - 5 reps - Heel Toe Raises with Counter Support  - 2 x daily - 5 x weekly - 1 sets - 10 reps  ASSESSMENT:  CLINICAL IMPRESSION: Despite side effects of ongoing chemo treatments, patient demonstartes gains in LE strength, function and endurance.  Improvements noted in 30s chair stand test, 2 MWT and PSFS.  Recommend continue PT 1w4 to follow up with progress as well as determine need for further treatment based on upcoming scan to address progress of chemo.   Patient is a 78 y.o. female who was seen today  for physical therapy evaluation and treatment for physical deconditioning due to ongoing CA care.  30s chair stand test shows LE strength deficits and 2 MWT shows endurance and mobility deficits.  Recommend OPPT 1w8 to accomdate chemo schedule with goal of improving strength, mobility and endurance as tolerated.  OBJECTIVE IMPAIRMENTS: Abnormal gait, cardiopulmonary status limiting activity, decreased activity tolerance, decreased endurance, decreased mobility, difficulty walking, decreased strength, and obesity.   ACTIVITY LIMITATIONS: carrying, lifting, standing, and stairs  PERSONAL FACTORS: Age, Fitness, and 1 comorbidity: active CA are also affecting patient's functional outcome.   REHAB POTENTIAL: Good  CLINICAL DECISION MAKING: Evolving/moderate complexity  EVALUATION COMPLEXITY: Low   GOALS: Goals reviewed with patient? No  SHORT TERM GOALS: Target date: 05/15/2024   Patient to demonstrate independence in HEP  Baseline: [email or web address] Goal status: Met   LONG TERM GOALS: Target  date: 08/17/24    Assess 2 MWT for gains in distance Baseline: 136ft; 06/17/24 172ft w/SPC Goal status: Ongoing  2.  Patient will increase 30s chair stand reps from 6 to 8 without arms to demonstrate and improved functional ability with less pain/difficulty as well as reduce fall risk.  Baseline: 6; 06/17/24 7 w/o arms Goal status: Ongoing  3.  Patient will score at least 25/30 on PSFS to signify clinically meaningful improvement in functional abilities.   Baseline: 19/30; 06/17/24 24/30  Goal status: Ongoing     PLAN:  PT FREQUENCY: 1x/week  PT DURATION: 8 weeks  PLANNED INTERVENTIONS: 97110-Therapeutic exercises, 97530- Therapeutic activity, 97112- Neuromuscular re-education, 97535- Self Care, 02859- Manual therapy, [id number]- Gait training, Patient/Family education, Balance training, and Stair training  PLAN FOR NEXT SESSION: HEP review and update, manual techniques as appropriate, aerobic  tasks, ROM and flexibility activities, strengthening and PREs, TPDN, gait and balance training as needed     Reyes CHRISTELLA Kohut, PT 06/17/2024, 1:18 PM  "

## 2024-06-16 NOTE — Progress Notes (Signed)
 Torben Soloway D, CMA  Zott, Gasper Ona, Tammy; Darrel Boyer New orders have been placed for the above pt, DOB: 05/06/2046 Thanks

## 2024-06-17 ENCOUNTER — Ambulatory Visit

## 2024-06-17 ENCOUNTER — Ambulatory Visit: Attending: General Surgery

## 2024-06-17 DIAGNOSIS — R5381 Other malaise: Secondary | ICD-10-CM | POA: Insufficient documentation

## 2024-06-17 DIAGNOSIS — R2681 Unsteadiness on feet: Secondary | ICD-10-CM | POA: Diagnosis not present

## 2024-06-20 ENCOUNTER — Inpatient Hospital Stay

## 2024-06-20 DIAGNOSIS — C259 Malignant neoplasm of pancreas, unspecified: Secondary | ICD-10-CM

## 2024-06-20 DIAGNOSIS — Z5111 Encounter for antineoplastic chemotherapy: Secondary | ICD-10-CM | POA: Diagnosis not present

## 2024-06-20 LAB — CBC WITH DIFFERENTIAL (CANCER CENTER ONLY)
Abs Immature Granulocytes: 0.02 K/uL (ref 0.00–0.07)
Basophils Absolute: 0 K/uL (ref 0.0–0.1)
Basophils Relative: 0 %
Eosinophils Absolute: 0.1 K/uL (ref 0.0–0.5)
Eosinophils Relative: 3 %
HCT: 28.5 % — ABNORMAL LOW (ref 36.0–46.0)
Hemoglobin: 8.9 g/dL — ABNORMAL LOW (ref 12.0–15.0)
Immature Granulocytes: 1 %
Lymphocytes Relative: 32 %
Lymphs Abs: 1 K/uL (ref 0.7–4.0)
MCH: 26.3 pg (ref 26.0–34.0)
MCHC: 31.2 g/dL (ref 30.0–36.0)
MCV: 84.1 fL (ref 80.0–100.0)
Monocytes Absolute: 0.1 K/uL (ref 0.1–1.0)
Monocytes Relative: 2 %
Neutro Abs: 2 K/uL (ref 1.7–7.7)
Neutrophils Relative %: 62 %
Platelet Count: 117 K/uL — ABNORMAL LOW (ref 150–400)
RBC: 3.39 MIL/uL — ABNORMAL LOW (ref 3.87–5.11)
RDW: 13.8 % (ref 11.5–15.5)
WBC Count: 3.2 K/uL — ABNORMAL LOW (ref 4.0–10.5)
nRBC: 0.9 % — ABNORMAL HIGH (ref 0.0–0.2)

## 2024-06-20 LAB — CMP (CANCER CENTER ONLY)
ALT: 74 U/L — ABNORMAL HIGH (ref 0–44)
AST: 58 U/L — ABNORMAL HIGH (ref 15–41)
Albumin: 4.1 g/dL (ref 3.5–5.0)
Alkaline Phosphatase: 90 U/L (ref 38–126)
Anion gap: 9 (ref 5–15)
BUN: 16 mg/dL (ref 8–23)
CO2: 21 mmol/L — ABNORMAL LOW (ref 22–32)
Calcium: 9.5 mg/dL (ref 8.9–10.3)
Chloride: 106 mmol/L (ref 98–111)
Creatinine: 0.8 mg/dL (ref 0.44–1.00)
GFR, Estimated: >60 mL/min (ref >60)
Glucose, Bld: 116 mg/dL — ABNORMAL HIGH (ref 70–99)
Potassium: 4 mmol/L (ref 3.5–5.1)
Sodium: 137 mmol/L (ref 135–145)
Total Bilirubin: 0.4 mg/dL (ref 0.0–1.2)
Total Protein: 7.2 g/dL (ref 6.5–8.1)

## 2024-06-20 MED ORDER — PACLITAXEL PROTEIN-BOUND CHEMO INJECTION 100 MG
123.0000 mg/m2 | Freq: Once | INTRAVENOUS | Status: AC
  Administered 2024-06-20: 250 mg via INTRAVENOUS
  Filled 2024-06-20: qty 50

## 2024-06-20 MED ORDER — SODIUM CHLORIDE 0.9 % IV SOLN: INTRAVENOUS | Status: DC

## 2024-06-20 MED ORDER — SODIUM CHLORIDE 0.9 % IV SOLN
1000.0000 mg/m2 | Freq: Once | INTRAVENOUS | Status: AC
  Administered 2024-06-20: 2052 mg via INTRAVENOUS
  Filled 2024-06-20: qty 53.97

## 2024-06-20 MED ORDER — PROCHLORPERAZINE MALEATE 10 MG PO TABS
10.0000 mg | ORAL_TABLET | Freq: Once | ORAL | Status: AC
  Administered 2024-06-20: 10 mg via ORAL
  Filled 2024-06-20: qty 1

## 2024-06-20 NOTE — Patient Instructions (Signed)
 [facility] - A DEPT OF [facility]   Discharge Instructions: Thank you for choosing [facility] to provide your oncology and hematology care.   If you have a lab appointment with the Cancer Center, please go directly to the Cancer Center and check in at the registration area.   Wear comfortable clothing and clothing appropriate for easy access to any Portacath or PICC line.   We strive to give you quality time with your provider. You may need to reschedule your appointment if you arrive late (15 or more minutes).  Arriving late affects you and other patients whose appointments are after yours.  Also, if you miss three or more appointments without notifying the office, you may be dismissed from the clinic at the provider's discretion.      For prescription refill requests, have your pharmacy contact our office and allow 72 hours for refills to be completed.    Today you received the following chemotherapy and/or immunotherapy agents: Paclitaxel protein-bound (Abraxane) and gemcitabine (Gemzar)      To help prevent nausea and vomiting after your treatment, we encourage you to take your nausea medication as directed.  BELOW ARE SYMPTOMS THAT SHOULD BE REPORTED IMMEDIATELY: *FEVER GREATER THAN 100.4 F (38 C) OR HIGHER *CHILLS OR SWEATING *NAUSEA AND VOMITING THAT IS NOT CONTROLLED WITH YOUR NAUSEA MEDICATION *UNUSUAL SHORTNESS OF BREATH *UNUSUAL BRUISING OR BLEEDING *URINARY PROBLEMS (pain or burning when urinating, or frequent urination) *BOWEL PROBLEMS (unusual diarrhea, constipation, pain near the anus) TENDERNESS IN MOUTH AND THROAT WITH OR WITHOUT PRESENCE OF ULCERS (sore throat, sores in mouth, or a toothache) UNUSUAL RASH, SWELLING OR PAIN  UNUSUAL VAGINAL DISCHARGE OR ITCHING   Items with * indicate a potential emergency and should be followed up as soon as possible or go to the Emergency Department if any problems should  occur.  Please show the CHEMOTHERAPY ALERT CARD or IMMUNOTHERAPY ALERT CARD at check-in to the Emergency Department and triage nurse.  Should you have questions after your visit or need to cancel or reschedule your appointment, please contact [facility] - A DEPT OF [facility]  Dept: [phone]  and follow the prompts.  Office hours are 8:00 a.m. to 4:30 p.m. Monday - Friday. Please note that voicemails left after 4:00 p.m. may not be returned until the following business day.  We are closed weekends and major holidays. You have access to a nurse at all times for urgent questions. Please call the main number to the clinic Dept: [phone] and follow the prompts.   For any non-urgent questions, you may also contact your provider using MyChart. We now offer e-Visits for anyone 24 and older to request care online for non-urgent symptoms. For details visit [email or web address].   Also download the MyChart app! Go to the app store, search MyChart, open the app, select [facility], and log in with your MyChart username and password.

## 2024-06-21 ENCOUNTER — Inpatient Hospital Stay

## 2024-06-23 ENCOUNTER — Ambulatory Visit: Admitting: Physical Medicine & Rehabilitation

## 2024-06-23 ENCOUNTER — Telehealth: Payer: Self-pay

## 2024-06-23 ENCOUNTER — Ambulatory Visit (HOSPITAL_COMMUNITY)
Admission: RE | Admit: 2024-06-23 | Discharge: 2024-06-23 | Disposition: A | Discharge status: Home / Self Care | Source: Ambulatory Visit | Attending: Oncology | Admitting: Oncology

## 2024-06-23 DIAGNOSIS — K449 Diaphragmatic hernia without obstruction or gangrene: Secondary | ICD-10-CM | POA: Diagnosis not present

## 2024-06-23 DIAGNOSIS — C259 Malignant neoplasm of pancreas, unspecified: Secondary | ICD-10-CM | POA: Diagnosis not present

## 2024-06-23 DIAGNOSIS — K5730 Diverticulosis of large intestine without perforation or abscess without bleeding: Secondary | ICD-10-CM | POA: Diagnosis not present

## 2024-06-23 MED ORDER — IOHEXOL 300 MG/ML  SOLN
100.0000 mL | Freq: Once | INTRAMUSCULAR | Status: AC | PRN
  Administered 2024-06-23: 100 mL via INTRAVENOUS

## 2024-06-23 MED ORDER — SODIUM CHLORIDE (PF) 0.9 % IJ SOLN
INTRAMUSCULAR | Status: AC
  Filled 2024-06-23: qty 50

## 2024-06-23 NOTE — Telephone Encounter (Signed)
 Spoke with the patient's daughter regarding her attempts to reach Dr. Clovis nurse. Informed her that Dr. Autumn is at the [facility] location today and that I could transfer the call to his nurse. She also inquired about the [facility], and I provided her with information about the clinic. I then forwarded the call to Dr. Clovis nurse and sent a secure chat noting that the daughter has concerns about her mother. She voiced understanding.

## 2024-06-24 ENCOUNTER — Telehealth: Payer: Self-pay

## 2024-06-24 ENCOUNTER — Emergency Department (HOSPITAL_COMMUNITY)

## 2024-06-24 ENCOUNTER — Telehealth: Payer: Self-pay | Admitting: Oncology

## 2024-06-24 ENCOUNTER — Emergency Department (HOSPITAL_COMMUNITY): Admission: EM | Admit: 2024-06-24 | Discharge: 2024-06-24 | Disposition: A | Discharge status: Home / Self Care

## 2024-06-24 DIAGNOSIS — J029 Acute pharyngitis, unspecified: Secondary | ICD-10-CM | POA: Insufficient documentation

## 2024-06-24 DIAGNOSIS — D849 Immunodeficiency, unspecified: Secondary | ICD-10-CM | POA: Diagnosis not present

## 2024-06-24 DIAGNOSIS — R0602 Shortness of breath: Secondary | ICD-10-CM | POA: Diagnosis not present

## 2024-06-24 DIAGNOSIS — Z7901 Long term (current) use of anticoagulants: Secondary | ICD-10-CM | POA: Diagnosis not present

## 2024-06-24 DIAGNOSIS — R000 Tachycardia, unspecified: Secondary | ICD-10-CM | POA: Insufficient documentation

## 2024-06-24 DIAGNOSIS — J40 Bronchitis, not specified as acute or chronic: Secondary | ICD-10-CM | POA: Diagnosis not present

## 2024-06-24 DIAGNOSIS — I771 Stricture of artery: Secondary | ICD-10-CM | POA: Diagnosis not present

## 2024-06-24 DIAGNOSIS — R042 Hemoptysis: Secondary | ICD-10-CM | POA: Diagnosis not present

## 2024-06-24 DIAGNOSIS — Z452 Encounter for adjustment and management of vascular access device: Secondary | ICD-10-CM | POA: Diagnosis not present

## 2024-06-24 DIAGNOSIS — R059 Cough, unspecified: Secondary | ICD-10-CM | POA: Diagnosis not present

## 2024-06-24 DIAGNOSIS — Z8507 Personal history of malignant neoplasm of pancreas: Secondary | ICD-10-CM | POA: Insufficient documentation

## 2024-06-24 DIAGNOSIS — Z859 Personal history of malignant neoplasm, unspecified: Secondary | ICD-10-CM

## 2024-06-24 DIAGNOSIS — C259 Malignant neoplasm of pancreas, unspecified: Secondary | ICD-10-CM | POA: Diagnosis not present

## 2024-06-24 LAB — I-STAT CHEM 8, ED
BUN: 16 mg/dL (ref 8–23)
Calcium, Ion: 1.3 mmol/L (ref 1.15–1.40)
Chloride: 108 mmol/L (ref 98–111)
Creatinine, Ser: 0.8 mg/dL (ref 0.44–1.00)
Glucose, Bld: 114 mg/dL — ABNORMAL HIGH (ref 70–99)
HCT: 26 % — ABNORMAL LOW (ref 36.0–46.0)
Hemoglobin: 8.8 g/dL — ABNORMAL LOW (ref 12.0–15.0)
Potassium: 4.4 mmol/L (ref 3.5–5.1)
Sodium: 138 mmol/L (ref 135–145)
TCO2: 21 mmol/L — ABNORMAL LOW (ref 22–32)

## 2024-06-24 LAB — CBC WITH DIFFERENTIAL/PLATELET
Abs Immature Granulocytes: 0 K/uL (ref 0.00–0.07)
Basophils Absolute: 0 K/uL (ref 0.0–0.1)
Basophils Relative: 1 %
Eosinophils Absolute: 0.1 K/uL (ref 0.0–0.5)
Eosinophils Relative: 4 %
HCT: 28.4 % — ABNORMAL LOW (ref 36.0–46.0)
Hemoglobin: 8.7 g/dL — ABNORMAL LOW (ref 12.0–15.0)
Immature Granulocytes: 0 %
Lymphocytes Relative: 41 %
Lymphs Abs: 0.5 K/uL — ABNORMAL LOW (ref 0.7–4.0)
MCH: 26.5 pg (ref 26.0–34.0)
MCHC: 30.6 g/dL (ref 30.0–36.0)
MCV: 86.6 fL (ref 80.0–100.0)
Monocytes Absolute: 0 K/uL — ABNORMAL LOW (ref 0.1–1.0)
Monocytes Relative: 1 %
Neutro Abs: 0.7 K/uL — ABNORMAL LOW (ref 1.7–7.7)
Neutrophils Relative %: 53 %
Platelets: 170 K/uL (ref 150–400)
RBC: 3.28 MIL/uL — ABNORMAL LOW (ref 3.87–5.11)
RDW: 13.6 % (ref 11.5–15.5)
Smear Review: NORMAL
WBC: 1.3 K/uL — CL (ref 4.0–10.5)
nRBC: 0 % (ref 0.0–0.2)

## 2024-06-24 LAB — BASIC METABOLIC PANEL WITH GFR
Anion gap: 9 (ref 5–15)
BUN: 17 mg/dL (ref 8–23)
CO2: 23 mmol/L (ref 22–32)
Calcium: 9.9 mg/dL (ref 8.9–10.3)
Chloride: 106 mmol/L (ref 98–111)
Creatinine, Ser: 0.72 mg/dL (ref 0.44–1.00)
GFR, Estimated: >60 mL/min (ref >60)
Glucose, Bld: 116 mg/dL — ABNORMAL HIGH (ref 70–99)
Potassium: 4.5 mmol/L (ref 3.5–5.1)
Sodium: 138 mmol/L (ref 135–145)

## 2024-06-24 LAB — RESP PANEL BY RT-PCR (RSV, FLU A&B, COVID)  RVPGX2
Influenza A by PCR: NEGATIVE
Influenza B by PCR: NEGATIVE
Resp Syncytial Virus by PCR: NEGATIVE
SARS Coronavirus 2 by RT PCR: NEGATIVE

## 2024-06-24 LAB — TROPONIN T, HIGH SENSITIVITY: Troponin T High Sensitivity: <15 ng/L (ref 0–19)

## 2024-06-24 MED ORDER — LIDOCAINE VISCOUS HCL 2 % MT SOLN
15.0000 mL | Freq: Once | OROMUCOSAL | Status: AC
  Administered 2024-06-24: 15 mL via OROMUCOSAL
  Filled 2024-06-24: qty 15

## 2024-06-24 MED ORDER — PSEUDOEPH-BROMPHEN-DM 30-2-10 MG/5ML PO SYRP: 2.5000 mL | ORAL_SOLUTION | Freq: Four times a day (QID) | ORAL | 0 refills | Status: DC | PRN

## 2024-06-24 MED ORDER — LACTATED RINGERS IV BOLUS
500.0000 mL | Freq: Once | INTRAVENOUS | Status: AC
  Administered 2024-06-24: 500 mL via INTRAVENOUS

## 2024-06-24 MED ORDER — IOHEXOL 350 MG/ML SOLN
75.0000 mL | Freq: Once | INTRAVENOUS | Status: AC | PRN
  Administered 2024-06-24: 75 mL via INTRAVENOUS

## 2024-06-24 NOTE — ED Triage Notes (Signed)
 Pt BIBA for cough with bloody sputum x 2 days.  Endorses shortness of breath with exertion and laryngitis.  Current undergoing cancer treatment.    Vital signs within range

## 2024-06-24 NOTE — ED Provider Notes (Signed)
 [place] EMERGENCY DEPARTMENT AT [facility] Provider Note   CSN: [id number] Arrival date & time: 06/24/24  9077     Patient presents with: No chief complaint on file.   Gina Key is a 78 y.o. female.   78 year old female presents for evaluation of cough.  States has been more short of breath the last few days as well.  States for 2 days she has been coughing up blood.  States sometimes it is frank blood and sometimes it is to streaked in her sputum.  Daughter at bedside states she has been very weak as well.  Patient denies any chest pain.  Denies any fevers, nausea, vomiting, or any other symptoms or concerns at this time.        Prior to Admission medications   Medication Sig Start Date End Date Taking? Authorizing Provider  brompheniramine-pseudoephedrine-DM 30-2-10 MG/5ML syrup Take 2.5 mLs by mouth 4 (four) times daily as needed (cough). 06/24/24  Yes Mily Malecki L, DO  atorvastatin  (LIPITOR) 40 MG tablet  02/21/24   [provider]  ELIQUIS  5 MG TABS tablet TAKE 1 TABLET BY MOUTH TWICE A DAY 04/09/24   [name], MD  ferrous sulfate 325 (65 FE) MG tablet Take 325 mg by mouth at bedtime.    [provider]  Fexofenadine HCl (ALLEGRA PO) Take by mouth.    [provider]  Incontinence Supply Disposable (WINGS HL ADULT BRIEFS/XL) MISC As needed daily. 09/27/23   [name], MD  ipratropium (ATROVENT ) 0.06 % nasal spray PLACE 2 SPRAYS INTO BOTH NOSTRILS 4 TIMES DAILY. 06/17/24   [name], MD  levETIRAcetam  (KEPPRA ) 500 MG tablet Take 1 tablet (500 mg total) by mouth 2 (two) times daily. 12/04/23   Gayland Lauraine PARAS, NP  lidocaine  (HM LIDOCAINE  PATCH) 4 % Place 1 patch onto the skin daily as needed.    [provider]  lidocaine -prilocaine  (EMLA ) cream Apply to affected area once 03/21/24   Pasam, Avinash, MD  lipase/protease/amylase (CREON ) 36000 UNITS CPEP capsule Take 2 capsules (72,000 Units total) by  mouth 3 (three) times daily with meals. May also take 1 capsule (36,000 Units total) as needed (with snacks - up to 4 snacks daily). 04/14/24   Pasam, Chinita, MD  losartan  (COZAAR ) 100 MG tablet TAKE 1 TABLET BY MOUTH EVERY DAY 11/30/23   [name], MD  metoprolol  succinate (TOPROL -XL) 25 MG 24 hr tablet TAKE 1 TABLET (25 MG TOTAL) BY MOUTH DAILY. 03/04/24   [name], MD  montelukast  (SINGULAIR ) 10 MG tablet TAKE 1 TABLET BY MOUTH EVERYDAY AT BEDTIME 03/04/24   [name], MD  omeprazole (PRILOSEC) 20 MG capsule TAKE 1 CAPSULE EVERY DAY 03/27/24   Mansouraty, Gabriel Jr., MD  ondansetron  (ZOFRAN ) 8 MG tablet Take 1 tablet (8 mg total) by mouth every 8 (eight) hours as needed for nausea or vomiting. 03/21/24   Pasam, Chinita, MD  oxyCODONE  (ROXICODONE ) 5 MG immediate release tablet Take 1-2 tablets (5-10 mg total) by mouth every 12 (twelve) hours as needed for severe pain (pain score 7-10) or moderate pain (pain score 4-6) (1 tablet for moderate pain, up to 2 tablets for severe). 05/16/24   Urbano Albright, MD  potassium chloride  (KLOR-CON ) 10 MEQ tablet TAKE 1 TABLET BY MOUTH EVERY DAY 04/18/24   [name], MD  prochlorperazine  (COMPAZINE ) 10 MG tablet Take 1 tablet (10 mg total) by mouth every 6 (six) hours as needed for nausea or  vomiting. 03/21/24   Pasam, Chinita, MD  rosuvastatin  (CRESTOR ) 20 MG tablet Take 1 tablet (20 mg total) by mouth daily. 12/07/23   [name], MD  simethicone (MYLICON) 125 MG chewable tablet Chew 125 mg by mouth every 6 (six) hours as needed for flatulence.    [provider]  spironolactone  (ALDACTONE ) 25 MG tablet TAKE 1 TABLET (25 MG TOTAL) BY MOUTH DAILY. 03/04/24   [name], MD  topiramate  (TOPAMAX ) 100 MG tablet TAKE 1 TABLET BY MOUTH TWICE A DAY 11/30/23   [name], MD    Allergies: Patient has no known allergies.    Review of Systems  Constitutional:  Negative for chills and fever.  HENT:  Negative for ear pain  and sore throat.   Eyes:  Negative for pain and visual disturbance.  Respiratory:  Positive for cough and shortness of breath.   Cardiovascular:  Negative for chest pain and palpitations.  Gastrointestinal:  Negative for abdominal pain and vomiting.  Genitourinary:  Negative for dysuria and hematuria.  Musculoskeletal:  Negative for arthralgias and back pain.  Skin:  Negative for color change and rash.  Neurological:  Negative for seizures and syncope.  All other systems reviewed and are negative.   Updated Vital Signs BP 119/75   Pulse 100   Temp 98.6 F (37 C) (Oral)   Resp 18   SpO2 100%   Physical Exam Vitals and nursing note reviewed.  Constitutional:      General: She is not in acute distress.    Appearance: Normal appearance. She is well-developed. She is not ill-appearing.  HENT:     Head: Normocephalic and atraumatic.  Eyes:     Conjunctiva/sclera: Conjunctivae normal.  Cardiovascular:     Rate and Rhythm: Regular rhythm. Tachycardia present.     Heart sounds: No murmur heard. Pulmonary:     Effort: Pulmonary effort is normal. No respiratory distress.     Breath sounds: Normal breath sounds.  Abdominal:     Palpations: Abdomen is soft.     Tenderness: There is no abdominal tenderness.  Musculoskeletal:        General: No swelling.     Cervical back: Neck supple.  Skin:    General: Skin is warm and dry.     Capillary Refill: Capillary refill takes less than 2 seconds.  Neurological:     Mental Status: She is alert.  Psychiatric:        Mood and Affect: Mood normal.     (all labs ordered are listed, but only abnormal results are displayed) Labs Reviewed  BASIC METABOLIC PANEL WITH GFR - Abnormal; Notable for the following components:      Result Value   Glucose, Bld 116 (*)    All other components within normal limits  CBC WITH DIFFERENTIAL/PLATELET - Abnormal; Notable for the following components:   WBC 1.3 (*)    RBC 3.28 (*)    Hemoglobin 8.7 (*)     HCT 28.4 (*)    Neutro Abs 0.7 (*)    Lymphs Abs 0.5 (*)    Monocytes Absolute 0.0 (*)    All other components within normal limits  I-STAT CHEM 8, ED - Abnormal; Notable for the following components:   Glucose, Bld 114 (*)    TCO2 21 (*)    Hemoglobin 8.8 (*)    HCT 26.0 (*)    All other components within normal limits  RESP PANEL BY RT-PCR (RSV, FLU A&B, COVID)  [id number]  PATHOLOGIST SMEAR REVIEW  TROPONIN T, HIGH SENSITIVITY  TROPONIN T, HIGH SENSITIVITY    EKG: EKG Interpretation Date/Time:  Tuesday June 24 2024 09:49:17 EST Ventricular Rate:  94 PR Interval:  167 QRS Duration:  76 QT Interval:  374 QTC Calculation: 471 R Axis:   -11  Text Interpretation: Sinus rhythm Low voltage, precordial leads Abnormal R-wave progression, early transition Borderline T wave abnormalities Compared with prior EKG from 10/04/2023 Confirmed by Gennaro Bouchard ([id number]) on 06/24/2024 9:58:12 AM  Radiology: CT Angio Chest PE W/Cm &/Or Wo Cm Result Date: 06/24/2024 CLINICAL DATA:  Shortness of breath and hemoptysis. History of prior pulmonary embolism and pancreatic carcinoma. EXAM: CT ANGIOGRAPHY CHEST WITH CONTRAST TECHNIQUE: Multidetector CT imaging of the chest was performed using the standard protocol during bolus administration of intravenous contrast. Multiplanar CT image reconstructions and MIPs were obtained to evaluate the vascular anatomy. RADIATION DOSE REDUCTION: This exam was performed according to the departmental dose-optimization program which includes automated exposure control, adjustment of the mA and/or kV according to patient size and/or use of iterative reconstruction technique. CONTRAST:  75mL OMNIPAQUE  IOHEXOL  350 MG/ML SOLN COMPARISON:  10/04/2023 FINDINGS: Cardiovascular: Previous bilateral pulmonary embolism has completely resolved since the prior CTA with no residual chronic thrombus remaining. No acute pulmonary embolism identified. Normal caliber central pulmonary  arteries. Normal caliber thoracic aorta without dissection. Normal heart size. No pericardial fluid. Stable mild calcified coronary artery plaque. Mediastinum/Nodes: No enlarged mediastinal, hilar, or axillary lymph nodes. Thyroid  gland, trachea, and esophagus demonstrate no significant findings. Lungs/Pleura: There is no evidence of pulmonary edema, consolidation, pneumothorax, nodule or pleural fluid. Upper Abdomen: Stable benign hepatic cyst. Musculoskeletal: No chest wall abnormality. No acute or significant osseous findings. Review of the MIP images confirms the above findings. IMPRESSION: 1. Complete resolution of previous bilateral pulmonary embolism. No acute pulmonary embolism identified. 2. Stable mild calcified coronary artery plaque. 3. Stable benign hepatic cyst. Electronically Signed   By: Marcey Moan M.D.   On: 06/24/2024 12:51   DG Chest 1 View Result Date: 06/24/2024 CLINICAL DATA:  Hemoptysis and shortness of breath. EXAM: CHEST  1 VIEW COMPARISON:  10/04/2023 FINDINGS: Stable heart size and aortic tortuosity. Interval right jugular Port-A-Cath placement with catheter tip at the SVC/RA junction. There is no evidence of pulmonary edema, consolidation, pneumothorax or pleural fluid. IMPRESSION: No acute findings. Interval right jugular Port-A-Cath placement. Electronically Signed   By: Marcey Moan M.D.   On: 06/24/2024 10:23     Procedures   Medications Ordered in the ED  lactated ringers bolus 500 mL (0 mLs Intravenous Stopped 06/24/24 1200)  iohexol  (OMNIPAQUE ) 350 MG/ML injection 75 mL (75 mLs Intravenous Contrast Given 06/24/24 1155)  lidocaine  (XYLOCAINE ) 2 % viscous mouth solution 15 mL (15 mLs Mouth/Throat Given 06/24/24 1419)                                    Medical Decision Making Cardiac monitor interpretation: Sinus tachycardia, no ectopy  Patient here for coughing up blood.  She has a negative PE study.  Not on any blood thinners.  Overall labs are reassuring,  and imaging including CT PE study chest x-ray are unremarkable.  Patient likely has bronchitis.  Was given IV fluids and feeling much better.  I will give a prescription for cough medication.  Advised standard medications as prescribed.  Increase her fluid intake and follow-up with her oncologist in 1  to 2 weeks.  Advised return to the ER for any new or worsening symptoms.  Patient and her family at bedside feel comfortable with the plan to be discharged home.  Problems Addressed: Bronchitis: acute illness or injury History of cancer: chronic illness or injury with exacerbation, progression, or side effects of treatment Sore throat: acute illness or injury  Amount and/or Complexity of Data Reviewed External Data Reviewed: notes.    Details: Prior outpatient records reviewed and patient follows with oncology for pancreatic adenocarcinoma Labs: ordered. Decision-making details documented in ED Course.    Details: Ordered and reviewed by me and patient has some neutropenia, no fever enzymes are fairly unremarkable otherwise Radiology: ordered and independent interpretation performed. Decision-making details documented in ED Course.    Details: Ordered and inter by me independently radiology Chest x-ray: Shows no acute abnormality CT PE study shows no acute abnormality ECG/medicine tests: ordered and independent interpretation performed. Decision-making details documented in ED Course.    Details: Ordered and inter by me in the absence of cardiology shows sinus rhythm, no STEMI or significant change when compared to prior EKG  Risk OTC drugs. Prescription drug management. Drug therapy requiring intensive monitoring for toxicity.     Final diagnoses:  Bronchitis  Sore throat  History of cancer    ED Discharge Orders          Ordered    brompheniramine-pseudoephedrine-DM 30-2-10 MG/5ML syrup  4 times daily PRN        06/24/24 1404               Amman Bartel L, DO 06/24/24  1610

## 2024-06-24 NOTE — Discharge Instructions (Signed)
 You can take your cough medicine as needed.  Follow-up with your cancer doctor and other specialists in 1 to 2 weeks.  Eat a balanced diet and drink lots of fluids.  Return to the ER for new or worsening symptoms.

## 2024-06-24 NOTE — Telephone Encounter (Signed)
 Returned patient's daughter's call Monday afternoon (06/23/2025). Daughter had concerns about her mother, including SOB (worsening from baseline), increased weakness, loss appetite without N/V. Some kind of respiratory infection also present. Coughing could be heard in the background. Also, report of mouth sores. We don't see her until December 12th, 2025. Daughter was strongly advised to take patient go to the emergency room but patient did not want to go. Followed up again this morning but had to leave a voicemail. Notified Dr. Autumn of situation and shared concerns with Nurse in Symptom Management. Plan was for patient to see Symptom Management over next two day possibly. While looking in patient's chart, it was noted that she was not at the Emergency Room. Reached out to patient's daughter again. Daughter said her mother was the same but that she finally was able to convince her mother to go to the ED per ambulance.

## 2024-06-25 ENCOUNTER — Ambulatory Visit (INDEPENDENT_AMBULATORY_CARE_PROVIDER_SITE_OTHER): Admitting: Family Medicine

## 2024-06-25 ENCOUNTER — Ambulatory Visit: Payer: Self-pay

## 2024-06-25 ENCOUNTER — Encounter: Payer: Self-pay | Admitting: Family Medicine

## 2024-06-25 VITALS — BP 132/78 | HR 99 | Temp 98.6°F | Ht 62.0 in | Wt 205.4 lb

## 2024-06-25 DIAGNOSIS — J069 Acute upper respiratory infection, unspecified: Secondary | ICD-10-CM | POA: Diagnosis not present

## 2024-06-25 DIAGNOSIS — J3489 Other specified disorders of nose and nasal sinuses: Secondary | ICD-10-CM

## 2024-06-25 DIAGNOSIS — C259 Malignant neoplasm of pancreas, unspecified: Secondary | ICD-10-CM

## 2024-06-25 DIAGNOSIS — J029 Acute pharyngitis, unspecified: Secondary | ICD-10-CM

## 2024-06-25 MED ORDER — BENZONATATE 100 MG PO CAPS: 100.0000 mg | ORAL_CAPSULE | Freq: Two times a day (BID) | ORAL | 0 refills | Status: AC | PRN

## 2024-06-25 NOTE — Progress Notes (Signed)
 "  Established Patient Office Visit   Subjective  Patient ID: Gina Key, female    DOB: 1946-03-11  Age: 78 y.o. MRN: [id number]  Chief Complaint  Patient presents with   Acute Visit    Patient went to the ED yesterday and was diagnosed with bronchitis but the medication they gave her would cause increased bleeding.  Patient wants new medicine prescribed.  Pt accompanied by her daughter.  Pt is a 78 yo female seen for ED f/u.  Pt endorses ST, productive cough, rhinorrhea x several days.  Pt seen in ED due to seeing blood in mucus when blowing nose.  Given rx for brompheniramine-pseudoephedrine- DM syrup but hesitant to take due to possible increased bleeding risk.  Requesting new med.  Pt undergoing chemo for pancreatic adenocarcinoma.  Medication is causing gingival bleeding.  Unable to brush teeth even with a soft baby toothbrush.  Pt using a mouth wash instead.    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   Aortic atherosclerosis 10/15/2023   Disc disease, degenerative, lumbar or lumbosacral 10/15/2023   Acute pulmonary embolism (HCC) 10/05/2023   Hypernatremia 10/05/2023   Dyspnea on exertion 10/05/2023   Osteopenia 12/14/2022   Essential hypertension 09/28/2021   Gastroesophageal reflux disease 09/28/2021   Seasonal allergies 09/28/2021   History of migraine 09/28/2021   Urinary incontinence 09/28/2021   Hyperlipidemia 09/28/2021   History of seizure 09/28/2021   OSA (obstructive sleep apnea) 09/28/2021   Past Medical History:  Diagnosis Date   Acid reflux    Allergy    Anxiety    DDD (degenerative disc disease), lumbar    Depression    Facet joint disease    GERD (gastroesophageal reflux disease)    High cholesterol    Hypertension    Low vitamin D  level    OSA  (obstructive sleep apnea)    Primary osteoarthritis involving multiple joints    Pulmonary embolism (HCC)    Seizures (HCC)    Sleep apnea    C PAP   Past Surgical History:  Procedure Laterality Date   ABDOMINAL HYSTERECTOMY     BREAST BIOPSY Right    benign   CHOLECYSTECTOMY     ESOPHAGOGASTRODUODENOSCOPY N/A 03/03/2024   Procedure: EGD (ESOPHAGOGASTRODUODENOSCOPY);  Surgeon: Wilhelmenia Aloha Raddle., MD;  Location: [facility];  Service: Gastroenterology;  Laterality: N/A;   EUS N/A 03/03/2024   Procedure: ULTRASOUND, UPPER GI TRACT, ENDOSCOPIC;  Surgeon: Wilhelmenia Aloha Raddle., MD;  Location: [facility];  Service: Gastroenterology;  Laterality: N/A;   FINE NEEDLE ASPIRATION  03/03/2024   Procedure: FINE NEEDLE ASPIRATION;  Surgeon: Wilhelmenia, Aloha Raddle., MD;  Location: [facility];  Service: Gastroenterology;;   IR IMAGING GUIDED PORT INSERTION  03/31/2024   left and right rotator cuff     Social History   Tobacco Use   Smoking status: Never    Passive exposure: Never   Smokeless tobacco: Never  Vaping Use   Vaping status: Never Used  Substance Use Topics   Alcohol use: Not Currently   Drug use: Never   Family History  Problem Relation Age of Onset   Pancreatic cancer Mother    Thyroid  cancer Sister    Colon polyps Daughter    Kidney cancer Daughter    Sleep apnea Daughter    Breast cancer Maternal Aunt  75   Seizures Paternal Grandmother    Cancer Brother        tongue   Sleep apnea Son    Stroke Son    Seizures Son    Sleep apnea Son    Seizures Grandson    Colon cancer Neg Hx    Crohn's disease Neg Hx    Esophageal cancer Neg Hx    Rectal cancer Neg Hx    Stomach cancer Neg Hx    Ulcerative colitis Neg Hx    Dementia Neg Hx    No Known Allergies  ROS Negative unless stated above    Objective:     BP 132/78 (BP Location: Right Arm, Patient Position: Sitting, Cuff Size: Large)   Pulse 99   Temp 98.6 F (37 C) (Oral)   Ht 5' 2 (1.575 m)    Wt 205 lb 6.4 oz (93.2 kg)   SpO2 99%   BMI 37.57 kg/m  BP Readings from Last 3 Encounters:  06/25/24 132/78  06/24/24 119/75  06/20/24 (P) 117/63   Wt Readings from Last 3 Encounters:  06/25/24 205 lb 6.4 oz (93.2 kg)  06/20/24 (P) 208 lb 8 oz (94.6 kg)  06/13/24 212 lb 6.4 oz (96.3 kg)      Physical Exam Constitutional:      General: She is not in acute distress.    Appearance: Normal appearance.  HENT:     Head: Normocephalic and atraumatic.     Nose: Rhinorrhea present.     Mouth/Throat:     Mouth: Mucous membranes are moist.  Cardiovascular:     Rate and Rhythm: Normal rate and regular rhythm.     Heart sounds: Normal heart sounds. No murmur heard.    No gallop.  Pulmonary:     Effort: Pulmonary effort is normal. No respiratory distress.     Breath sounds: Normal breath sounds. No wheezing, rhonchi or rales.  Skin:    General: Skin is warm and dry.  Neurological:     Mental Status: She is alert and oriented to person, place, and time.        06/20/2024    9:23 AM 06/06/2024    9:00 AM 05/23/2024    3:26 PM  Depression screen PHQ 2/9  Decreased Interest 0 0 0  Down, Depressed, Hopeless 0 0 0  PHQ - 2 Score 0 0 0      02/06/2024   10:27 AM 09/27/2023    4:22 PM 05/04/2023    8:16 AM 03/05/2023    3:31 PM  GAD 7 : Generalized Anxiety Score  Nervous, Anxious, on Edge 0 0 0 0  Control/stop worrying 0 0 0 0  Worry too much - different things 0 0 0 0  Trouble relaxing 0 0 0 0  Restless 0 0 0 0  Easily annoyed or irritable 0 0 0 0  Afraid - awful might happen 0 0 0 0  Total GAD 7 Score 0 0 0 0  Anxiety Difficulty  Not difficult at all Not difficult at all      No results found for any visits on 06/25/24.    Assessment & Plan:   Viral URI with cough -     Benzonatate ; Take 1 capsule (100 mg total) by mouth 2 (two) times daily as needed for cough. (Patient not taking: Reported on 07/11/2024)  Dispense: 20 capsule; Refill: 0  Sore  throat  Rhinorrhea  Pancreatic adenocarcinoma (HCC)  Acute uri symptoms.  Seen in  ED 06/24/24 for same and cough with blood in sputum.  PE work up negative. Dx'd with bronchitis. Concerned about cough med rx'd.  D/c rx for brompheniramine-pseudoephedrine.  Start tessalon .  Continue supportive care with at home cough/cold remedies, rest, hydration, etc.  Blood likely from s/e of chemo for pancreatic adenocarcinoma as causing gingival bleeding and irritation.  Discuss with Oncology if worsening.  Return if symptoms worsen or fail to improve.   Clotilda JONELLE Single, MD "

## 2024-06-25 NOTE — Telephone Encounter (Signed)
 FYI Only or Action Required?: FYI only for provider: appointment scheduled on 06/25/24.  Patient was last seen in primary care on 03/28/2024 by [name], MD.  Called Nurse Triage reporting Cough.  Symptoms began several days ago.  Interventions attempted: Nothing.  Symptoms are: gradually worsening.  Triage Disposition: See HCP Within 4 Hours (Or PCP Triage)  Patient/caregiver understands and will follow disposition?: Yes Reason for Disposition  [1] Coughed up blood AND [2] > 1 tablespoon (15 ml)   (Exception: Blood-tinged sputum.)  Answer Assessment - Initial Assessment Questions Patient's daughter [name] calling in with patient today. Seen in ER yesterday, dx with bronchitis and given Pseudoeph-Bromphen-DM 30-2-10 MG/5ML 2.5 mLs Oral 4 times daily PRN, pharmacist told them the side effects and patient decided not to take it. Patient reports today her chest hurts really bad from coughing and now has diarrhea  1. ONSET: When did the cough begin?      Couple days ago  2. SEVERITY: How bad is the cough today?      Severe, chest hurts really bad to cough  3. SPUTUM: Describe the color of your sputum (e.g., none, dry cough; clear, white, yellow, green)     Clear and yellow  4. HEMOPTYSIS: Are you coughing up any blood? If so ask: How much? (e.g., flecks, streaks, tablespoons, etc.)     Sometimes a lot, sometimes streaks, enough to make me scared  5. DIFFICULTY BREATHING: Are you having difficulty breathing? If Yes, ask: How bad is it? (e.g., mild, moderate, severe)      Mild SOB with exertion  6. FEVER: Do you have a fever? If Yes, ask: What is your temperature, how was it measured, and when did it start?     Denies  7. CARDIAC HISTORY: Do you have any history of heart disease? (e.g., heart attack, congestive heart failure)      Denies  8. LUNG HISTORY: Do you have any history of lung disease?  (e.g., pulmonary embolus, asthma, emphysema)      Denies  9. OTHER SYMPTOMS: Do you have any other symptoms? (e.g., runny nose, wheezing, chest pain)       Diarrhea starting yesterday, sore throat  Protocols used: Cough - Chronic-A-AH  Copied from CRM #[id number]. Topic: Clinical - Red Word Triage >> Jun 25, 2024  8:23 AM Harlene ORN wrote: Red Word that prompted transfer to Nurse Triage: Was in the ER yesterday; was diagnosed with bronchitis and gave her a presciription for cough but no antibiotics the pharmacy states that the prescription that was prescribed by the urgent will cause loss of balance (falling) and increased bleeding Never took the medicine. New Symptoms: Hurts for her to cough now, and has diarrhea. after being released from the ER yesterday.

## 2024-06-25 NOTE — Telephone Encounter (Signed)
 Pt is being seen 12/3

## 2024-07-04 ENCOUNTER — Inpatient Hospital Stay: Attending: Physician Assistant | Admitting: Oncology

## 2024-07-04 ENCOUNTER — Inpatient Hospital Stay: Attending: Physician Assistant

## 2024-07-04 ENCOUNTER — Inpatient Hospital Stay

## 2024-07-04 ENCOUNTER — Encounter: Payer: Self-pay | Admitting: Oncology

## 2024-07-04 VITALS — BP 99/46 | HR 87 | Temp 98.8°F | Resp 18 | Ht 62.0 in | Wt 201.4 lb

## 2024-07-04 VITALS — BP 121/66 | HR 70 | Resp 18

## 2024-07-04 DIAGNOSIS — C259 Malignant neoplasm of pancreas, unspecified: Secondary | ICD-10-CM

## 2024-07-04 DIAGNOSIS — E860 Dehydration: Secondary | ICD-10-CM | POA: Insufficient documentation

## 2024-07-04 DIAGNOSIS — R7401 Elevation of levels of liver transaminase levels: Secondary | ICD-10-CM | POA: Insufficient documentation

## 2024-07-04 DIAGNOSIS — Z86718 Personal history of other venous thrombosis and embolism: Secondary | ICD-10-CM | POA: Insufficient documentation

## 2024-07-04 DIAGNOSIS — Z7901 Long term (current) use of anticoagulants: Secondary | ICD-10-CM | POA: Diagnosis not present

## 2024-07-04 DIAGNOSIS — J209 Acute bronchitis, unspecified: Secondary | ICD-10-CM | POA: Diagnosis not present

## 2024-07-04 DIAGNOSIS — R634 Abnormal weight loss: Secondary | ICD-10-CM | POA: Diagnosis not present

## 2024-07-04 DIAGNOSIS — R5383 Other fatigue: Secondary | ICD-10-CM | POA: Insufficient documentation

## 2024-07-04 DIAGNOSIS — Z5111 Encounter for antineoplastic chemotherapy: Secondary | ICD-10-CM | POA: Insufficient documentation

## 2024-07-04 DIAGNOSIS — R630 Anorexia: Secondary | ICD-10-CM | POA: Diagnosis not present

## 2024-07-04 LAB — CMP (CANCER CENTER ONLY)
ALT: 196 U/L — ABNORMAL HIGH (ref 0–44)
AST: 136 U/L — ABNORMAL HIGH (ref 15–41)
Albumin: 3.8 g/dL (ref 3.5–5.0)
Alkaline Phosphatase: 111 U/L (ref 38–126)
Anion gap: 11 (ref 5–15)
BUN: 14 mg/dL (ref 8–23)
CO2: 21 mmol/L — ABNORMAL LOW (ref 22–32)
Calcium: 9.7 mg/dL (ref 8.9–10.3)
Chloride: 105 mmol/L (ref 98–111)
Creatinine: 0.82 mg/dL (ref 0.44–1.00)
GFR, Estimated: >60 mL/min (ref >60)
Glucose, Bld: 150 mg/dL — ABNORMAL HIGH (ref 70–99)
Potassium: 3.8 mmol/L (ref 3.5–5.1)
Sodium: 137 mmol/L (ref 135–145)
Total Bilirubin: 0.4 mg/dL (ref 0.0–1.2)
Total Protein: 7.5 g/dL (ref 6.5–8.1)

## 2024-07-04 LAB — CBC WITH DIFFERENTIAL (CANCER CENTER ONLY)
Abs Immature Granulocytes: 0.3 K/uL — ABNORMAL HIGH (ref 0.00–0.07)
Basophils Absolute: 0.1 K/uL (ref 0.0–0.1)
Basophils Relative: 1 %
Eosinophils Absolute: 0 K/uL (ref 0.0–0.5)
Eosinophils Relative: 0 %
HCT: 28.5 % — ABNORMAL LOW (ref 36.0–46.0)
Hemoglobin: 8.8 g/dL — ABNORMAL LOW (ref 12.0–15.0)
Immature Granulocytes: 3 %
Lymphocytes Relative: 14 %
Lymphs Abs: 1.6 K/uL (ref 0.7–4.0)
MCH: 26 pg (ref 26.0–34.0)
MCHC: 30.9 g/dL (ref 30.0–36.0)
MCV: 84.3 fL (ref 80.0–100.0)
Monocytes Absolute: 1.3 K/uL — ABNORMAL HIGH (ref 0.1–1.0)
Monocytes Relative: 11 %
Neutro Abs: 8.6 K/uL — ABNORMAL HIGH (ref 1.7–7.7)
Neutrophils Relative %: 71 %
Platelet Count: 447 K/uL — ABNORMAL HIGH (ref 150–400)
RBC: 3.38 MIL/uL — ABNORMAL LOW (ref 3.87–5.11)
RDW: 15.2 % (ref 11.5–15.5)
WBC Count: 11.9 K/uL — ABNORMAL HIGH (ref 4.0–10.5)
nRBC: 1 % — ABNORMAL HIGH (ref 0.0–0.2)

## 2024-07-04 MED ORDER — SODIUM CHLORIDE 0.9 % IV SOLN: Freq: Once | INTRAVENOUS | Status: AC

## 2024-07-04 NOTE — Assessment & Plan Note (Addendum)
 Please review oncology history for additional details and timeline of events.  Her case was discussed in GI tumor conference on 03/19/2024. Uncertainty remains regarding the second spot in the gastrohepatic ligament, which may be post-biopsy inflammation rather than cancer.   Genetic testing came back negative for any deleterious germline mutations.  Patient had consultation with Dr. 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  The location of this does appear to be relatively central.  It is unclear to me whether this is proximal enough in the body that would require a Whipple or a distal pancreas.  It is not well-seen on imaging.  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 We will start with neoadjuvant chemotherapy.  I will then get repeat imaging.  I do want a pancreatic protocol follow-up scan.   Plan made for neoadjuvant chemotherapy with gemcitabine  and Abraxane , dose reduced for her age and comorbidities, followed by surgical evaluation.  Initially will plan for chemotherapy on days 1, 15 of 28-day cycle and if well-tolerated, we will attempt to do chemo on days 1, 8, 15 of 28-day cycle.  We started with 20% dose reduction of gemcitabine  and Abraxane .  Increased to standard dosing from cycle 2 onwards.  She has been tolerating chemotherapy reasonably well without major side effects.  Had mild nausea for couple of days after chemotherapy but no other major issues.  For the first 2 cycles, she received chemotherapy on days 1, 15 of 28-day cycle.  Since she was tolerating chemotherapy very well without major side effects, we attempted to increase the frequency to days 1, 8, 15 of 28-day cycle with cycle 3.  Following completion of cycle 3, she had significant fatigue, shortness of breath, decreased appetite, mouth sores and other side effects that led to ED visit.  She did not have her to be hospitalized.     Restaging CT pancreatic protocol on 06/23/2024 showed no significant change compared to prior.  No new disease.  Going forward, we will reduce the frequency of treatments to days 1, 15 of 28-day cycle.  Will consider dose reduction of chemotherapy further depending on symptoms.  Patient still has fatigue, decreased appetite and is recovering from recent bronchitis episode.  Hence we will take a break from chemotherapy today.  Currently due for cycle 4-day 1 of chemotherapy.  Will reevaluate in 1 week for possible resumption of chemotherapy and we will consider dose reduction by 20%.

## 2024-07-04 NOTE — Patient Instructions (Signed)

## 2024-07-04 NOTE — Progress Notes (Signed)
 [facility]  ONCOLOGY CLINIC PROGRESS NOTE   Patient Care Team: [name], MD as PCP - General (Family Medicine) Mansouraty, Aloha Raddle., MD as Consulting Physician (Gastroenterology)  PATIENT NAME: Gina Key   MR#: [id number] DOB: Mar 15, 1946  Date of visit: 07/04/2024   ASSESSMENT & PLAN:   Gina Key is a 78 y.o.  lady with a past medical history of hypertension, history of pulmonary embolism in March 2025, seizure disorder, obstructive sleep apnea, arthritis, GERD, was referred to our clinic in August 2025 for pancreatic adenocarcinoma.   Pancreatic adenocarcinoma [facility]) Please review oncology history for additional details and timeline of events.  Her case was discussed in GI tumor conference on 03/19/2024. Uncertainty remains regarding the second spot in the gastrohepatic ligament, which may be post-biopsy inflammation rather than cancer.   Genetic testing came back negative for any deleterious germline mutations.  Patient had consultation with Dr. 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  The location of this does appear to be relatively central.  It is unclear to me whether this is proximal enough in the body that would require a Whipple or a distal pancreas.  It is not well-seen on imaging.  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 We will start with neoadjuvant chemotherapy.  I will then get repeat imaging.  I do want a pancreatic protocol follow-up scan.   Plan made for neoadjuvant chemotherapy with gemcitabine  and Abraxane , dose reduced for her age and comorbidities, followed by surgical evaluation.  Initially will plan for chemotherapy on days 1, 15 of 28-day cycle and if well-tolerated, we will attempt to do chemo on days 1, 8, 15 of 28-day cycle.  We started with 20% dose reduction of gemcitabine  and Abraxane .  Increased to standard dosing from cycle  2 onwards.  She has been tolerating chemotherapy reasonably well without major side effects.  Had mild nausea for couple of days after chemotherapy but no other major issues.  For the first 2 cycles, she received chemotherapy on days 1, 15 of 28-day cycle.  Since she was tolerating chemotherapy very well without major side effects, we attempted to increase the frequency to days 1, 8, 15 of 28-day cycle with cycle 3.  Following completion of cycle 3, she had significant fatigue, shortness of breath, decreased appetite, mouth sores and other side effects that led to ED visit.  She did not have her to be hospitalized.    Restaging CT pancreatic protocol on 06/23/2024 showed no significant change compared to prior.  No new disease.  Going forward, we will reduce the frequency of treatments to days 1, 15 of 28-day cycle.  Will consider dose reduction of chemotherapy further depending on symptoms.  Patient still has fatigue, decreased appetite and is recovering from recent bronchitis episode.  Hence we will take a break from chemotherapy today.  Currently due for cycle 4-day 1 of chemotherapy.  Will reevaluate in 1 week for possible resumption of chemotherapy and we will consider dose reduction by 20%.   Chemotherapy-induced adverse effects Significant toxicities from chemotherapy, including fatigue, dyspnea, oral mucositis (now resolved), anorexia, and diarrhea. Recent neutropenia has resolved. Mildly elevated hepatic transaminases, likely related to chemotherapy and/or intermittent acetaminophen  use. Hemoglobin is stable, platelets are normal, and renal function and electrolytes are within normal limits. Adverse effects necessitated holding chemotherapy and reducing intensity moving forward. - Held chemotherapy this week due  to adverse effects and clinical deconditioning. - Planned to resume chemotherapy next week at reduced frequency (every other week) and reduced dose (80%). - Monitored CBC, hepatic  transaminases, renal function, and electrolytes.  Acute bronchitis Recent episode of acute bronchitis contributed to fatigue, cough, and transient hemoptysis. Treated with cough medicine for cough; no antibiotics administered. Symptoms have improved and pulmonary exam is currently clear. Viral illness likely contributed to overall deconditioning. - Assessed respiratory status and performed pulmonary exam. - Advised continued use of cough medicine as needed for cough. - Recommended regular deep breathing exercises (10 times per hour) to maintain pulmonary function. - Advised wearing a mask in public due to increased prevalence of viral illness.  Dehydration Clinically dehydrated, likely secondary to poor oral intake, anorexia, and recent illness. Noted low energy and inadequate oral intake. - Administered intravenous fluids in the infusion treatment room. - Scheduled follow-up visit next week to reassess hydration and overall clinical status.  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 Blood counts are well-managed. - Continue Eliquis  for anticoagulation. - Monitor blood counts regularly during chemotherapy.   I reviewed lab results and outside records for this visit and discussed relevant results with the patient. Diagnosis, plan of care and treatment options were also discussed in detail with the patient. Opportunity provided to ask questions and answers provided to her apparent satisfaction. Provided instructions to call our clinic with any problems, questions or concerns prior to return visit. I recommended to continue follow-up with PCP and sub-specialists. She verbalized understanding and agreed with the plan.   NCCN guidelines have been consulted in the planning of this patients care.  I spent a total of 42 minutes during this encounter with the patient including review of chart and various tests results, discussions about plan  of care and coordination of care plan.   [name], MD  07/04/2024 7:45 PM  [facility] - [facility] DEPT [facility] [address] [place] [address] Dept: [phone] Dept Fax: [phone]    CHIEF COMPLAINT/ REASON FOR VISIT:   Adenocarcinoma of pancreas, Stage I vs stage II  Current Treatment: Plan for neoadjuvant chemotherapy with dose reduce gemcitabine  and Abraxane , followed by surgical evaluation.  Started systemic treatment with gemcitabine  and Abraxane  from 04/11/2024.  INTERVAL HISTORY:    Discussed the use of AI scribe software for clinical note transcription with the patient, who gave verbal consent to proceed.  History of Present Illness Mikael Debell is a 78 year old female with locally advanced pancreatic adenocarcinoma who presents for follow-up of disease status and management of recent treatment-related complications.  She has been receiving chemotherapy for pancreatic adenocarcinoma, initially at 80% dose every other week, then at full strength, and most recently increased to weekly dosing. This change resulted in significant worsening of fatigue, dyspnea, and deconditioning, prompting cessation of chemotherapy.  Recent CT imaging demonstrated stable disease with no new lesions or interval growth. The pancreatic mass appears decreased in size compared to prior PET scan but unchanged relative to the previous MRI. The tumor remains difficult to delineate due to complex anatomy. No new metastatic disease is identified.  She reports profound fatigue, decreased energy, poor oral intake, anorexia, and an episode of diarrhea on the day of the visit. She denies vomiting. Oral mucositis has resolved. She has not required oxycodone  for shoulder pain in the past two to three weeks but continues to take Creon .  She also experienced a headache today.  She recently developed acute bronchitis, treated with  Tessalon  Perles without antibiotics, and experienced cough and transient hemoptysis. Her family notes this episode significantly worsened her overall condition. Her white blood cell count, previously 1,300 following chemotherapy, has normalized to 11,900. Hemoglobin remains stable at 8.8, and platelet count is normal. Liver enzymes are mildly elevated; renal function and electrolytes are within normal limits. She has not been taking acetaminophen  regularly.  She has had poor oral intake and recent illness. She expressed interest in traveling in April.   I have reviewed the past medical history, past surgical history, social history and family history with the patient and they are unchanged from previous note.  HISTORY OF PRESENT ILLNESS:   ONCOLOGY HISTORY:   On review of the previous records patient was evaluated in the emergency department on 02/05/2024 for gross hematuria.  CT abdomen pelvis was performed which showed moderate dilation of the pancreatic duct in the mid body and tail overlying and parenchymal atrophy. MR abdomen was ordered by PCP Dr. Mercer and performed on 02/07/24. MRI showed a focal segment of pancreatic ductal dilatation with abrupt caliber change and stricture. Subtle mass lesion in this location identified measuring 17 x 15 mm at the level of the caliber change and has appearance worrisome for neoplasm such as an adenocarcinoma. Image did not show any other areas concerning for malignant disease.    CMP collected 02/05/24 in the ED was overall unremarkable. Further chart review shows patient was diagnosed with a submassive PE with right heart strain 10/05/23 and is currently taking Eliquis .     She is retired, having worked in associate professor. She does not smoke or drink alcohol. She currently lives with her daughter due to past seizures. Her last colonoscopy in February 2024 was normal, as was her last mammogram. Her family history is significant for her mother having  pancreatic cancer at age 92, her brother having throat cancer, and her daughter having kidney cancer.   She was seen in our rapid diagnostic clinic on 02/12/2024 for consultation and was referred to GI for EUS/biopsy.  Labs on that day showed a CA 19-9 of 8, CEA undetectable, chromogranin A increased at 224.8 ng/mL.  CBCD and CMP were unremarkable with normal LFTs.   On 03/03/2024 Dr. Wilhelmenia performed EGD and EUS.  On EUS, mass was identified in the pancreatic body.  Endosonographic appearance was highly suspicious for adenocarcinoma.  Staged as T1c, N0, MX.  FNA performed.  There was no sign of significant pathology in the CBD and in the common hepatic duct.  No malignant appearing lymph nodes were visualized in the celiac region, peripancreatic region and porta hepatis.  A cyst was found in the visualized portion of the liver measured 26 mm x 23 mm.  No evidence of any solid mass lesions in the left lobe of liver.   FNA from pancreatic mass came back positive for adenocarcinoma.   On 03/11/2024, staging PET scan showed ill-defined heterogeneous low-level hypermetabolic in the mid pancreas, corresponding to the abnormality on previous MRI.  Features compatible with biopsy-proven pancreatic adenocarcinoma.  16 x 11 mm focus of amorphous soft tissue in the gastrohepatic ligament was hypermetabolic and concerning for metastatic disease.  No evidence of hypermetabolic liver metastasis.  No evidence of metastatic disease elsewhere.   Her case was discussed in GI tumor conference on 03/19/2024. Uncertainty remains regarding the second spot in the gastrohepatic ligament, which may be post-biopsy inflammation rather than  cancer.   Genetic testing came back negative for any deleterious germline mutations.  Patient had consultation with Dr. 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  The location of this does appear to be relatively central.  It is unclear to me whether this is  proximal enough in the body that would require a Whipple or a distal pancreas.  It is not well-seen on imaging.  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 We will start with neoadjuvant chemotherapy.  I will then get repeat imaging.  I do want a pancreatic protocol follow-up scan.   Plan for neoadjuvant chemotherapy with gemcitabine  and Abraxane , dose reduced for her age and comorbidities, followed by surgical evaluation.  Started systemic treatment with gemcitabine  and Abraxane  from 04/11/2024.  Oncology History  Pancreatic adenocarcinoma (HCC)  03/03/2024 Initial Diagnosis   Pancreatic adenocarcinoma (HCC)   03/21/2024 Cancer Staging   Staging form: Exocrine Pancreas, AJCC 8th Edition - Clinical: Stage IIB (cT1c, cN1, cM0) - Signed by Autumn Millman, MD on 04/11/2024   03/31/2024 Genetic Testing   Negative genetic testing. No pathogenic variants identified on the [facility] CancerNext-Expanded+RNA Panel. The report date is 03/31/2024.   The CancerNext-Expanded gene panel offered by [facility] and includes sequencing, rearrangement, and RNA analysis for the following 77 genes: AIP, ALK, APC, ATM, AXIN2, BAP1, BARD1, BMPR1A, BRCA1, BRCA2, BRIP1, CDC73, CDH1, CDK4, CDKN1B, CDKN2A, CEBPA, CHEK2, CTNNA1, DDX41, DICER1, ETV6, FH, FLCN, GATA2, LZTR1, MAX, MBD4, MEN1, MET, MLH1, MSH2, MSH3, MSH6, MUTYH, NF1, NF2, NTHL1, PALB2, PHOX2B, PMS2, POT1, PRKAR1A, PTCH1, PTEN, RAD51C, RAD51D, RB1, RET, RPS20, RUNX1, SDHA, SDHAF2, SDHB, SDHC, SDHD, SMAD4, SMARCA4, SMARCB1, SMARCE1, STK11, SUFU, TMEM127, TP53, TSC1, TSC2, VHL, and WT1 (sequencing and deletion/duplication); EGFR, HOXB13, KIT, MITF, PDGFRA, POLD1, and POLE (sequencing only); EPCAM and GREM1 (deletion/duplication only).    04/11/2024 -  Chemotherapy   Patient is on Treatment Plan : PANCREATIC Abraxane  D1,8,15 + Gemcitabine  D1,8,15 q28d         REVIEW OF SYSTEMS:   Review of Systems -  Oncology  All other pertinent systems were reviewed with the patient and are negative.  ALLERGIES: She has no known allergies.  MEDICATIONS:  Current Outpatient Medications  Medication Sig Dispense Refill   atorvastatin  (LIPITOR) 40 MG tablet      benzonatate  (TESSALON ) 100 MG capsule Take 1 capsule (100 mg total) by mouth 2 (two) times daily as needed for cough. 20 capsule 0   ELIQUIS  5 MG TABS tablet TAKE 1 TABLET BY MOUTH TWICE A DAY 180 tablet 1   ferrous sulfate 325 (65 FE) MG tablet Take 325 mg by mouth at bedtime.     Fexofenadine HCl (ALLEGRA PO) Take by mouth.     Incontinence Supply Disposable (WINGS HL ADULT BRIEFS/XL) MISC As needed daily. 100 each 11   ipratropium (ATROVENT ) 0.06 % nasal spray PLACE 2 SPRAYS INTO BOTH NOSTRILS 4 TIMES DAILY. 45 mL 1   levETIRAcetam  (KEPPRA ) 500 MG tablet Take 1 tablet (500 mg total) by mouth 2 (two) times daily. 180 tablet 3   lidocaine  (HM LIDOCAINE  PATCH) 4 % Place 1 patch onto the skin daily as needed.     lidocaine -prilocaine  (EMLA ) cream Apply to affected area once 30 g 3   lipase/protease/amylase (CREON ) 36000 UNITS CPEP capsule Take 2 capsules (72,000 Units total) by mouth 3 (three) times daily with meals. May also take 1 capsule (36,000 Units total) as needed (with  snacks - up to 4 snacks daily). 300 capsule 11   losartan  (COZAAR ) 100 MG tablet TAKE 1 TABLET BY MOUTH EVERY DAY 90 tablet 1   metoprolol  succinate (TOPROL -XL) 25 MG 24 hr tablet TAKE 1 TABLET (25 MG TOTAL) BY MOUTH DAILY. 90 tablet 1   montelukast  (SINGULAIR ) 10 MG tablet TAKE 1 TABLET BY MOUTH EVERYDAY AT BEDTIME 90 tablet 1   omeprazole (PRILOSEC) 20 MG capsule TAKE 1 CAPSULE EVERY DAY 90 capsule 1   ondansetron  (ZOFRAN ) 8 MG tablet Take 1 tablet (8 mg total) by mouth every 8 (eight) hours as needed for nausea or vomiting. 30 tablet 1   oxyCODONE  (ROXICODONE ) 5 MG immediate release tablet Take 1-2 tablets (5-10 mg total) by mouth every 12 (twelve) hours as needed for  severe pain (pain score 7-10) or moderate pain (pain score 4-6) (1 tablet for moderate pain, up to 2 tablets for severe). 90 tablet 0   potassium chloride  (KLOR-CON ) 10 MEQ tablet TAKE 1 TABLET BY MOUTH EVERY DAY 90 tablet 0   prochlorperazine  (COMPAZINE ) 10 MG tablet Take 1 tablet (10 mg total) by mouth every 6 (six) hours as needed for nausea or vomiting. 30 tablet 1   rosuvastatin  (CRESTOR ) 20 MG tablet Take 1 tablet (20 mg total) by mouth daily. 90 tablet 3   simethicone (MYLICON) 125 MG chewable tablet Chew 125 mg by mouth every 6 (six) hours as needed for flatulence.     spironolactone  (ALDACTONE ) 25 MG tablet TAKE 1 TABLET (25 MG TOTAL) BY MOUTH DAILY. 90 tablet 1   topiramate  (TOPAMAX ) 100 MG tablet TAKE 1 TABLET BY MOUTH TWICE A DAY 180 tablet 1   Current Facility-Administered Medications  Medication Dose Route Frequency Provider Last Rate Last Admin   lidocaine  (XYLOCAINE ) 1 % (with pres) injection 1 mL  1 mL Other Once        lidocaine  (XYLOCAINE ) 1 % (with pres) injection 4 mL  4 mL Other Once        triamcinolone  acetonide (KENALOG -40) injection 40 mg  40 mg Intramuscular Once        triamcinolone  acetonide (KENALOG -40) injection 40 mg  40 mg Intra-articular Once          VITALS:   Blood pressure (!) 99/46, pulse 87, temperature 98.8 F (37.1 C), temperature source Temporal, resp. rate 18, height 5' 2 (1.575 m), weight 201 lb 6.4 oz (91.4 kg), SpO2 98%.  Wt Readings from Last 3 Encounters:  07/04/24 201 lb 6.4 oz (91.4 kg)  06/25/24 205 lb 6.4 oz (93.2 kg)  06/20/24 (P) 208 lb 8 oz (94.6 kg)    Body mass index is 36.84 kg/m.    Onc Performance Status - 07/04/24 1003       ECOG Perf Status   ECOG Perf Status Capable of only limited selfcare, confined to bed or chair more than 50% of waking hours      KPS SCALE   KPS % SCORE Cares for self, unable to carry on normal activity or to do active work            PHYSICAL EXAM:   Physical Exam Constitutional:       General: She is not in acute distress.    Appearance: Normal appearance.  HENT:     Head: Normocephalic and atraumatic.  Cardiovascular:     Rate and Rhythm: Normal rate.  Pulmonary:     Effort: Pulmonary effort is normal. No respiratory distress.  Abdominal:  General: There is no distension.  Neurological:     General: No focal deficit present.     Mental Status: She is alert and oriented to person, place, and time.  Psychiatric:        Mood and Affect: Mood normal.        Behavior: Behavior normal.       LABORATORY DATA:   I have reviewed the data as listed.  Results for orders placed or performed in visit on 07/04/24  CMP (Cancer Center only)  Result Value Ref Range   Sodium 137 135 - 145 mmol/L   Potassium 3.8 3.5 - 5.1 mmol/L   Chloride 105 98 - 111 mmol/L   CO2 21 (L) 22 - 32 mmol/L   Glucose, Bld 150 (H) 70 - 99 mg/dL   BUN 14 8 - 23 mg/dL   Creatinine 9.17 9.55 - 1.00 mg/dL   Calcium  9.7 8.9 - 10.3 mg/dL   Total Protein 7.5 6.5 - 8.1 g/dL   Albumin 3.8 3.5 - 5.0 g/dL   AST 863 (H) 15 - 41 U/L   ALT 196 (H) 0 - 44 U/L   Alkaline Phosphatase 111 38 - 126 U/L   Total Bilirubin 0.4 0.0 - 1.2 mg/dL   GFR, Estimated >39 >39 mL/min   Anion gap 11 5 - 15  CBC with Differential (Cancer Center Only)  Result Value Ref Range   WBC Count 11.9 (H) 4.0 - 10.5 K/uL   RBC 3.38 (L) 3.87 - 5.11 MIL/uL   Hemoglobin 8.8 (L) 12.0 - 15.0 g/dL   HCT 71.4 (L) 63.9 - 53.9 %   MCV 84.3 80.0 - 100.0 fL   MCH 26.0 26.0 - 34.0 pg   MCHC 30.9 30.0 - 36.0 g/dL   RDW 84.7 88.4 - 84.4 %   Platelet Count 447 (H) 150 - 400 K/uL   nRBC 1.0 (H) 0.0 - 0.2 %   Neutrophils Relative % 71 %   Neutro Abs 8.6 (H) 1.7 - 7.7 K/uL   Lymphocytes Relative 14 %   Lymphs Abs 1.6 0.7 - 4.0 K/uL   Monocytes Relative 11 %   Monocytes Absolute 1.3 (H) 0.1 - 1.0 K/uL   Eosinophils Relative 0 %   Eosinophils Absolute 0.0 0.0 - 0.5 K/uL   Basophils Relative 1 %   Basophils Absolute 0.1 0.0 - 0.1  K/uL   Immature Granulocytes 3 %   Abs Immature Granulocytes 0.30 (H) 0.00 - 0.07 K/uL        RADIOGRAPHIC STUDIES:  CT ABD PELVIS W/WO CM ONCOLOGY PANCREATIC PROTOCOL Result Date: 06/28/2024 CLINICAL DATA:  Restaging pancreatic cancer. Currently undergoing neoadjuvant chemotherapy. Assess treatment response. EXAM: CT ABDOMEN AND PELVIS WITHOUT AND WITH CONTRAST TECHNIQUE: Multidetector CT imaging of the abdomen and pelvis was performed following the standard protocol before and following the bolus administration of intravenous contrast. RADIATION DOSE REDUCTION: This exam was performed according to the departmental dose-optimization program which includes automated exposure control, adjustment of the mA and/or kV according to patient size and/or use of iterative reconstruction technique. CONTRAST:  OMNIPAQUE  IOHEXOL  300 MG/ML  SOLN COMPARISON:  PET-CT 03/11/2024 and CT scan 02/05/2024 FINDINGS: Lower chest: The lung bases are clear of acute process. No pleural effusion or pulmonary lesions. The heart is normal in size. No pericardial effusion. The distal esophagus and aorta are unremarkable. Hepatobiliary: Stable hepatic cysts. No worrisome hepatic lesions to suggest metastatic disease. The gallbladder is surgically absent. No biliary dilatation. Pancreas: The pancreatic body  lesion is very difficult to accurately identify or measure and is a very subtle infiltrative lesion causing obstruction of the main pancreatic duct and upstream dilatation and pancreatic atrophy. My best estimate is that lesion measures 19 x 17 mm on image number 12 of series 9. This would correlate with the remeasured lesion on the prior study from 02/05/2024. On the prior MRI examination this measured approximately 18 x 17 mm. It looks much larger on the PET-CT but again it is very difficult to discretely identify or measure. Overall I do not see any findings for progression. Spleen: Normal size.  No focal lesions.  Adrenals/Urinary Tract: Adrenal glands and kidneys are unremarkable and stable. Stomach/Bowel: Small hiatal hernia. Stomach is otherwise unremarkable. The duodenum, small bowel and colon are unremarkable. Stable sigmoid colon diverticulosis. Vascular/Lymphatic: The aorta and branch vessels are patent. Stable atherosclerotic calcifications. The major venous structures are patent. No periportal or peripancreatic adenopathy. No mesenteric or retroperitoneal adenopathy. Reproductive: Surgically absent. Other: No pelvic mass or adenopathy. No free pelvic fluid collections. No inguinal mass or adenopathy. No abdominal wall hernia or subcutaneous lesions. Musculoskeletal: No significant bony findings. Stable scoliosis and degenerative lumbar spondylosis. IMPRESSION: 1. The pancreatic body lesion is very difficult to accurately identify or measure. I do not see any change in size when compared to prior CT and MRI examinations. It did appear much larger on the PET-CT. 2. No findings for metastatic disease involving the abdomen/pelvis. 3. Stable hepatic cysts. 4. Status post cholecystectomy. No biliary dilatation. 5. Aortic atherosclerosis. Aortic Atherosclerosis (ICD10-I70.0). Electronically Signed   By: MYRTIS Stammer M.D.   On: 06/28/2024 20:59   CT Angio Chest PE W/Cm &/Or Wo Cm Result Date: 06/24/2024 CLINICAL DATA:  Shortness of breath and hemoptysis. History of prior pulmonary embolism and pancreatic carcinoma. EXAM: CT ANGIOGRAPHY CHEST WITH CONTRAST TECHNIQUE: Multidetector CT imaging of the chest was performed using the standard protocol during bolus administration of intravenous contrast. Multiplanar CT image reconstructions and MIPs were obtained to evaluate the vascular anatomy. RADIATION DOSE REDUCTION: This exam was performed according to the departmental dose-optimization program which includes automated exposure control, adjustment of the mA and/or kV according to patient size and/or use of iterative  reconstruction technique. CONTRAST:  75mL OMNIPAQUE  IOHEXOL  350 MG/ML SOLN COMPARISON:  10/04/2023 FINDINGS: Cardiovascular: Previous bilateral pulmonary embolism has completely resolved since the prior CTA with no residual chronic thrombus remaining. No acute pulmonary embolism identified. Normal caliber central pulmonary arteries. Normal caliber thoracic aorta without dissection. Normal heart size. No pericardial fluid. Stable mild calcified coronary artery plaque. Mediastinum/Nodes: No enlarged mediastinal, hilar, or axillary lymph nodes. Thyroid  gland, trachea, and esophagus demonstrate no significant findings. Lungs/Pleura: There is no evidence of pulmonary edema, consolidation, pneumothorax, nodule or pleural fluid. Upper Abdomen: Stable benign hepatic cyst. Musculoskeletal: No chest wall abnormality. No acute or significant osseous findings. Review of the MIP images confirms the above findings. IMPRESSION: 1. Complete resolution of previous bilateral pulmonary embolism. No acute pulmonary embolism identified. 2. Stable mild calcified coronary artery plaque. 3. Stable benign hepatic cyst. Electronically Signed   By: Marcey Moan M.D.   On: 06/24/2024 12:51   DG Chest 1 View Result Date: 06/24/2024 CLINICAL DATA:  Hemoptysis and shortness of breath. EXAM: CHEST  1 VIEW COMPARISON:  10/04/2023 FINDINGS: Stable heart size and aortic tortuosity. Interval right jugular Port-A-Cath placement with catheter tip at the SVC/RA junction. There is no evidence of pulmonary edema, consolidation, pneumothorax or pleural fluid. IMPRESSION: No acute findings. Interval right jugular  Port-A-Cath placement. Electronically Signed   By: Marcey Moan M.D.   On: 06/24/2024 10:23     CODE STATUS:  Code Status History     Date Active Date Inactive Code Status Order ID Comments User Context   10/05/2023 0213 10/06/2023 1942 Full Code [id number]  Lee Kingfisher, MD ED    Questions for Most Recent Historical Code Status  (Order [id number])     Question Answer   By: Consent: discussion documented in EHR            Orders Placed This Encounter  Procedures   CBC with Differential (Cancer Center Only)    Standing Status:   Future    Expected Date:   07/11/2024    Expiration Date:   07/11/2025   CMP (Cancer Center only)    Standing Status:   Future    Expected Date:   07/11/2024    Expiration Date:   07/11/2025   CBC with Differential (Cancer Center Only)    Standing Status:   Future    Expected Date:   08/15/2024    Expiration Date:   08/15/2025   CMP (Cancer Center only)    Standing Status:   Future    Expected Date:   08/15/2024    Expiration Date:   08/15/2025   CBC with Differential (Cancer Center Only)    Standing Status:   Future    Expected Date:   08/29/2024    Expiration Date:   08/29/2025   CMP (Cancer Center only)    Standing Status:   Future    Expected Date:   08/29/2024    Expiration Date:   08/29/2025     Future Appointments  Date Time Provider Department Center  07/09/2024  5:00 PM Zenaida Reyes [facility]  07/11/2024  8:15 AM [facility] FLUSH [facility] None  07/11/2024  9:00 AM [facility] INFUSION [facility] None  07/11/2024  9:00 AM Ivonne Harlene RAMAN, RD [facility] None  07/14/2024  5:00 PM Zenaida Reyes [facility]  07/28/2024  2:40 PM Urbano Albright, MD [facility] CPR  02/25/2025  1:40 PM [facility] WELLNESS VISIT [facility] [address]  06/10/2025  7:45 AM Gayland Lauraine PARAS, NP [facility] None      This document was completed utilizing speech recognition software. Grammatical errors, random word insertions, pronoun errors, and incomplete sentences are an occasional consequence of this system due to software limitations, ambient noise, and hardware issues. Any formal questions or concerns about the content, text or information contained within the body of this dictation should be directly addressed to the provider for clarification.

## 2024-07-09 ENCOUNTER — Encounter: Payer: Self-pay | Admitting: Family Medicine

## 2024-07-09 ENCOUNTER — Ambulatory Visit

## 2024-07-11 ENCOUNTER — Other Ambulatory Visit: Payer: Self-pay

## 2024-07-11 ENCOUNTER — Inpatient Hospital Stay: Admitting: Dietician

## 2024-07-11 ENCOUNTER — Inpatient Hospital Stay

## 2024-07-11 ENCOUNTER — Inpatient Hospital Stay: Admitting: Oncology

## 2024-07-11 ENCOUNTER — Encounter: Payer: Self-pay | Admitting: Oncology

## 2024-07-11 VITALS — BP 112/70 | HR 71 | Temp 97.9°F | Resp 18

## 2024-07-11 VITALS — BP 122/73 | HR 89 | Temp 98.0°F | Resp 17 | Ht 62.0 in | Wt 202.0 lb

## 2024-07-11 DIAGNOSIS — C259 Malignant neoplasm of pancreas, unspecified: Secondary | ICD-10-CM

## 2024-07-11 DIAGNOSIS — Z5111 Encounter for antineoplastic chemotherapy: Secondary | ICD-10-CM | POA: Diagnosis not present

## 2024-07-11 LAB — CBC WITH DIFFERENTIAL (CANCER CENTER ONLY)
Abs Immature Granulocytes: 0.09 K/uL — ABNORMAL HIGH (ref 0.00–0.07)
Basophils Absolute: 0.1 K/uL (ref 0.0–0.1)
Basophils Relative: 1 %
Eosinophils Absolute: 0.1 K/uL (ref 0.0–0.5)
Eosinophils Relative: 1 %
HCT: 30.2 % — ABNORMAL LOW (ref 36.0–46.0)
Hemoglobin: 9.2 g/dL — ABNORMAL LOW (ref 12.0–15.0)
Immature Granulocytes: 1 %
Lymphocytes Relative: 19 %
Lymphs Abs: 2 K/uL (ref 0.7–4.0)
MCH: 26.5 pg (ref 26.0–34.0)
MCHC: 30.5 g/dL (ref 30.0–36.0)
MCV: 87 fL (ref 80.0–100.0)
Monocytes Absolute: 0.9 K/uL (ref 0.1–1.0)
Monocytes Relative: 8 %
Neutro Abs: 7.7 K/uL (ref 1.7–7.7)
Neutrophils Relative %: 70 %
Platelet Count: 459 K/uL — ABNORMAL HIGH (ref 150–400)
RBC: 3.47 MIL/uL — ABNORMAL LOW (ref 3.87–5.11)
RDW: 16.8 % — ABNORMAL HIGH (ref 11.5–15.5)
WBC Count: 10.8 K/uL — ABNORMAL HIGH (ref 4.0–10.5)
nRBC: 0.4 % — ABNORMAL HIGH (ref 0.0–0.2)

## 2024-07-11 LAB — CMP (CANCER CENTER ONLY)
ALT: 80 U/L — ABNORMAL HIGH (ref 0–44)
AST: 56 U/L — ABNORMAL HIGH (ref 15–41)
Albumin: 3.9 g/dL (ref 3.5–5.0)
Alkaline Phosphatase: 99 U/L (ref 38–126)
Anion gap: 11 (ref 5–15)
BUN: 12 mg/dL (ref 8–23)
CO2: 21 mmol/L — ABNORMAL LOW (ref 22–32)
Calcium: 9.6 mg/dL (ref 8.9–10.3)
Chloride: 109 mmol/L (ref 98–111)
Creatinine: 0.8 mg/dL (ref 0.44–1.00)
GFR, Estimated: >60 mL/min
Glucose, Bld: 115 mg/dL — ABNORMAL HIGH (ref 70–99)
Potassium: 3.7 mmol/L (ref 3.5–5.1)
Sodium: 140 mmol/L (ref 135–145)
Total Bilirubin: 0.3 mg/dL (ref 0.0–1.2)
Total Protein: 7.3 g/dL (ref 6.5–8.1)

## 2024-07-11 MED ORDER — SODIUM CHLORIDE 0.9 % IV SOLN: Freq: Once | INTRAVENOUS | Status: AC

## 2024-07-11 NOTE — Progress Notes (Signed)
 Opened to observe flow sheet.

## 2024-07-11 NOTE — Progress Notes (Signed)
 "  [facility]  ONCOLOGY CLINIC PROGRESS NOTE   Patient Care Team: [name], MD as PCP - General (Family Medicine) Mansouraty, Aloha Raddle., MD as Consulting Physician (Gastroenterology)  PATIENT NAME: Gina Key   MR#: [id number] DOB: 08/19/1945  Date of visit: 07/11/2024   ASSESSMENT & PLAN:   Gina Key is a 78 y.o. lady with a past medical history of hypertension, history of pulmonary embolism in March 2025, seizure disorder, obstructive sleep apnea, arthritis, GERD, was referred to our clinic in August 2025 for pancreatic adenocarcinoma.   Pancreatic adenocarcinoma [facility]) Please review oncology history for additional details and timeline of events.  Her case was discussed in GI tumor conference on 03/19/2024. Uncertainty remains regarding the second spot in the gastrohepatic ligament, which may be post-biopsy inflammation rather than cancer.   Genetic testing came back negative for any deleterious germline mutations.  Patient had consultation with Dr. 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  The location of this does appear to be relatively central.  It is unclear to me whether this is proximal enough in the body that would require a Whipple or a distal pancreas.  It is not well-seen on imaging.  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 We will start with neoadjuvant chemotherapy.  I will then get repeat imaging.  I do want a pancreatic protocol follow-up scan.   Plan made for neoadjuvant chemotherapy with gemcitabine  and Abraxane , dose reduced for her age and comorbidities, followed by surgical evaluation.  Initially will plan for chemotherapy on days 1, 15 of 28-day cycle and if well-tolerated, we will attempt to do chemo on days 1, 8, 15 of 28-day cycle.  We started with 20% dose reduction of gemcitabine  and Abraxane .  Increased to standard dosing from cycle  2 onwards.  She has been tolerating chemotherapy reasonably well without major side effects.  Had mild nausea for couple of days after chemotherapy but no other major issues.  For the first 2 cycles, she received chemotherapy on days 1, 15 of 28-day cycle.  Since she was tolerating chemotherapy very well without major side effects, we attempted to increase the frequency to days 1, 8, 15 of 28-day cycle with cycle 3.  Following completion of cycle 3, she had significant fatigue, shortness of breath, decreased appetite, mouth sores and other side effects that led to ED visit.  She did not have her to be hospitalized.    Restaging CT pancreatic protocol on 06/23/2024 showed no significant change compared to prior.  No new disease.  Going forward, we will reduce the frequency of treatments to days 1, 15 of 28-day cycle.  Will consider dose reduction of chemotherapy further depending on symptoms.  Patient still has fatigue, decreased appetite and is recovering from recent bronchitis episode. Karnofsky Performance Status is 50, requiring considerable assistance with activities of daily living. She is not a surgical candidate. The primary concern is her current low energy and functional status, which must improve before resuming chemotherapy.  Hence we will take a break from chemotherapy today.  She was given IV hydration in clinic today.  Currently due for cycle 4-day 1 of chemotherapy.  Will reevaluate in 1 week for possible resumption of chemotherapy and we will consider dose reduction by 20%.  Fatigue and anorexia Significant fatigue and anorexia with 12-pound weight loss and decreased oral intake. Energy level is approximately 50%  of baseline, requiring assistance with activities of daily living. Symptoms attributed to both chemotherapy and recent bronchitis. Fatigue is the primary reason for delaying the next chemotherapy cycle. - Held chemotherapy this week to allow for recovery and improvement in  energy and nutritional status. - Advised to focus on improving oral intake, hydration, and activity level at home over the next week. - Planned to reassess energy and functional status at next visit before resuming chemotherapy.  Acute bronchitis Recent acute bronchitis significantly worsened functional status and fatigue. Cough has improved, but she remains weak and short of breath. Bronchitis is a major contributor to current deconditioning.  Dehydration Likely mild dehydration due to decreased oral intake and suboptimal fluid consumption, contributing to fatigue and constipation. - Administered IV fluids during the visit for symptomatic support. - Advised to increase oral fluid intake to at least five bottles of water per day.  Constipation Constipation is likely multifactorial, related to decreased oral intake and dehydration. - Advised to increase oral hydration and activity as tolerated to help alleviate constipation.  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 Blood counts are well-managed. - Continue Eliquis  for anticoagulation. - Monitor blood counts regularly during chemotherapy.   I reviewed lab results and outside records for this visit and discussed relevant results with the patient. Diagnosis, plan of care and treatment options were also discussed in detail with the patient. Opportunity provided to ask questions and answers provided to her apparent satisfaction. Provided instructions to call our clinic with any problems, questions or concerns prior to return visit. I recommended to continue follow-up with PCP and sub-specialists. She verbalized understanding and agreed with the plan.   NCCN guidelines have been consulted in the planning of this patients care.  I spent a total of 42 minutes during this encounter with the patient including review of chart and various tests results, discussions about plan of care and  coordination of care plan.   [name], MD  07/11/2024 10:36 AM  [facility] - A DEPT OF [facility] [address] [place] [address] Dept: [phone] Dept Fax: [phone]    CHIEF COMPLAINT/ REASON FOR VISIT:   Adenocarcinoma of pancreas, Stage I vs stage II  Current Treatment: Plan for neoadjuvant chemotherapy with dose reduce gemcitabine  and Abraxane , followed by surgical evaluation.  Started systemic treatment with gemcitabine  and Abraxane  from 04/11/2024.  INTERVAL HISTORY:    Discussed the use of AI scribe software for clinical note transcription with the patient, who gave verbal consent to proceed.  History of Present Illness  Gina Key is a 78 year old female with locally advanced pancreatic adenocarcinoma undergoing chemotherapy who presents with significant fatigue, anorexia, and functional decline following recent acute bronchitis.  Over the past several weeks, she has experienced marked fatigue and weakness, with energy levels approximately 50% of baseline. She is unable to perform usual activities at home and now requires assistance with bathing and toileting to prevent falls. Lower extremity weakness persists, and she continues to have exertional dyspnea. Functional status remains significantly below baseline despite gradual improvement since her episode of bronchitis.  She has had poor appetite with a 12-pound unintentional weight loss. She denies nausea or diarrhea but reports constipation. Oral intake is suboptimal, with fluid intake limited to three to four bottles of water per day. She continues all prescribed antihypertensive medications, with administration overseen by a caregiver.  Her cough, previously prominent during  acute bronchitis, has improved. She has not received chemotherapy for three weeks, with her last dose on 06/20/2024. Prior to bronchitis, she tolerated  chemotherapy without significant issues. Recent laboratory studies show improvement in blood counts (hemoglobin increased to 9.2 from 8.8, normal white blood cell count, elevated platelets). Liver transaminases (AST and ALT) were elevated last week; improved today.   I have reviewed the past medical history, past surgical history, social history and family history with the patient and they are unchanged from previous note.  HISTORY OF PRESENT ILLNESS:   ONCOLOGY HISTORY:   On review of the previous records patient was evaluated in the emergency department on 02/05/2024 for gross hematuria.  CT abdomen pelvis was performed which showed moderate dilation of the pancreatic duct in the mid body and tail overlying and parenchymal atrophy. MR abdomen was ordered by PCP Dr. Mercer and performed on 02/07/24. MRI showed a focal segment of pancreatic ductal dilatation with abrupt caliber change and stricture. Subtle mass lesion in this location identified measuring 17 x 15 mm at the level of the caliber change and has appearance worrisome for neoplasm such as an adenocarcinoma. Image did not show any other areas concerning for malignant disease.    CMP collected 02/05/24 in the ED was overall unremarkable. Further chart review shows patient was diagnosed with a submassive PE with right heart strain 10/05/23 and is currently taking Eliquis .     She is retired, having worked in associate professor. She does not smoke or drink alcohol. She currently lives with her daughter due to past seizures. Her last colonoscopy in February 2024 was normal, as was her last mammogram. Her family history is significant for her mother having pancreatic cancer at age 29, her brother having throat cancer, and her daughter having kidney cancer.   She was seen in our rapid diagnostic clinic on 02/12/2024 for consultation and was referred to GI for EUS/biopsy.  Labs on that day showed a CA 19-9 of 8, CEA undetectable, chromogranin A  increased at 224.8 ng/mL.  CBCD and CMP were unremarkable with normal LFTs.   On 03/03/2024 Dr. Wilhelmenia performed EGD and EUS.  On EUS, mass was identified in the pancreatic body.  Endosonographic appearance was highly suspicious for adenocarcinoma.  Staged as T1c, N0, MX.  FNA performed.  There was no sign of significant pathology in the CBD and in the common hepatic duct.  No malignant appearing lymph nodes were visualized in the celiac region, peripancreatic region and porta hepatis.  A cyst was found in the visualized portion of the liver measured 26 mm x 23 mm.  No evidence of any solid mass lesions in the left lobe of liver.   FNA from pancreatic mass came back positive for adenocarcinoma.   On 03/11/2024, staging PET scan showed ill-defined heterogeneous low-level hypermetabolic in the mid pancreas, corresponding to the abnormality on previous MRI.  Features compatible with biopsy-proven pancreatic adenocarcinoma.  16 x 11 mm focus of amorphous soft tissue in the gastrohepatic ligament was hypermetabolic and concerning for metastatic disease.  No evidence of hypermetabolic liver metastasis.  No evidence of metastatic disease elsewhere.   Her case was discussed in GI tumor conference on 03/19/2024. Uncertainty remains regarding the second spot in the gastrohepatic ligament, which may be post-biopsy inflammation rather than cancer.   Genetic testing came back negative for any deleterious germline mutations.  Patient had consultation with Dr. 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  The location of this  does appear to be relatively central.  It is unclear to me whether this is proximal enough in the body that would require a Whipple or a distal pancreas.  It is not well-seen on imaging.  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 We will start with neoadjuvant chemotherapy.  I will then get repeat imaging.  I do  want a pancreatic protocol follow-up scan.   Plan for neoadjuvant chemotherapy with gemcitabine  and Abraxane , dose reduced for her age and comorbidities, followed by surgical evaluation.  Started systemic treatment with gemcitabine  and Abraxane  from 04/11/2024.  Oncology History  Pancreatic adenocarcinoma (HCC)  03/03/2024 Initial Diagnosis   Pancreatic adenocarcinoma (HCC)   03/21/2024 Cancer Staging   Staging form: Exocrine Pancreas, AJCC 8th Edition - Clinical: Stage IIB (cT1c, cN1, cM0) - Signed by Autumn Millman, MD on 04/11/2024   03/31/2024 Genetic Testing   Negative genetic testing. No pathogenic variants identified on the [facility] CancerNext-Expanded+RNA Panel. The report date is 03/31/2024.   The CancerNext-Expanded gene panel offered by [facility] and includes sequencing, rearrangement, and RNA analysis for the following 77 genes: AIP, ALK, APC, ATM, AXIN2, BAP1, BARD1, BMPR1A, BRCA1, BRCA2, BRIP1, CDC73, CDH1, CDK4, CDKN1B, CDKN2A, CEBPA, CHEK2, CTNNA1, DDX41, DICER1, ETV6, FH, FLCN, GATA2, LZTR1, MAX, MBD4, MEN1, MET, MLH1, MSH2, MSH3, MSH6, MUTYH, NF1, NF2, NTHL1, PALB2, PHOX2B, PMS2, POT1, PRKAR1A, PTCH1, PTEN, RAD51C, RAD51D, RB1, RET, RPS20, RUNX1, SDHA, SDHAF2, SDHB, SDHC, SDHD, SMAD4, SMARCA4, SMARCB1, SMARCE1, STK11, SUFU, TMEM127, TP53, TSC1, TSC2, VHL, and WT1 (sequencing and deletion/duplication); EGFR, HOXB13, KIT, MITF, PDGFRA, POLD1, and POLE (sequencing only); EPCAM and GREM1 (deletion/duplication only).    04/11/2024 -  Chemotherapy   Patient is on Treatment Plan : PANCREATIC Abraxane  D1,8,15 + Gemcitabine  D1,8,15 q28d         REVIEW OF SYSTEMS:   Review of Systems - Oncology  All other pertinent systems were reviewed with the patient and are negative.  ALLERGIES: She has no known allergies.  MEDICATIONS:  Current Outpatient Medications  Medication Sig Dispense Refill   atorvastatin  (LIPITOR) 40 MG tablet      ELIQUIS  5 MG TABS tablet TAKE 1 TABLET BY MOUTH  TWICE A DAY 180 tablet 1   ferrous sulfate 325 (65 FE) MG tablet Take 325 mg by mouth at bedtime.     Fexofenadine HCl (ALLEGRA PO) Take by mouth.     Incontinence Supply Disposable (WINGS HL ADULT BRIEFS/XL) MISC As needed daily. 100 each 11   ipratropium (ATROVENT ) 0.06 % nasal spray PLACE 2 SPRAYS INTO BOTH NOSTRILS 4 TIMES DAILY. 45 mL 1   levETIRAcetam  (KEPPRA ) 500 MG tablet Take 1 tablet (500 mg total) by mouth 2 (two) times daily. 180 tablet 3   lidocaine  (HM LIDOCAINE  PATCH) 4 % Place 1 patch onto the skin daily as needed.     lidocaine -prilocaine  (EMLA ) cream Apply to affected area once 30 g 3   lipase/protease/amylase (CREON ) 36000 UNITS CPEP capsule Take 2 capsules (72,000 Units total) by mouth 3 (three) times daily with meals. May also take 1 capsule (36,000 Units total) as needed (with snacks - up to 4 snacks daily). 300 capsule 11   losartan  (COZAAR ) 100 MG tablet TAKE 1 TABLET BY MOUTH EVERY DAY 90 tablet 1   metoprolol  succinate (TOPROL -XL) 25 MG 24 hr tablet TAKE 1 TABLET (25 MG TOTAL) BY MOUTH DAILY. 90 tablet 1   montelukast  (SINGULAIR ) 10 MG tablet TAKE 1 TABLET BY MOUTH  EVERYDAY AT BEDTIME 90 tablet 1   omeprazole (PRILOSEC) 20 MG capsule TAKE 1 CAPSULE EVERY DAY 90 capsule 1   ondansetron  (ZOFRAN ) 8 MG tablet Take 1 tablet (8 mg total) by mouth every 8 (eight) hours as needed for nausea or vomiting. 30 tablet 1   oxyCODONE  (ROXICODONE ) 5 MG immediate release tablet Take 1-2 tablets (5-10 mg total) by mouth every 12 (twelve) hours as needed for severe pain (pain score 7-10) or moderate pain (pain score 4-6) (1 tablet for moderate pain, up to 2 tablets for severe). 90 tablet 0   potassium chloride  (KLOR-CON ) 10 MEQ tablet TAKE 1 TABLET BY MOUTH EVERY DAY 90 tablet 0   prochlorperazine  (COMPAZINE ) 10 MG tablet Take 1 tablet (10 mg total) by mouth every 6 (six) hours as needed for nausea or vomiting. 30 tablet 1   rosuvastatin  (CRESTOR ) 20 MG tablet Take 1 tablet (20 mg total) by  mouth daily. 90 tablet 3   simethicone (MYLICON) 125 MG chewable tablet Chew 125 mg by mouth every 6 (six) hours as needed for flatulence.     spironolactone  (ALDACTONE ) 25 MG tablet TAKE 1 TABLET (25 MG TOTAL) BY MOUTH DAILY. 90 tablet 1   topiramate  (TOPAMAX ) 100 MG tablet TAKE 1 TABLET BY MOUTH TWICE A DAY 180 tablet 1   benzonatate  (TESSALON ) 100 MG capsule Take 1 capsule (100 mg total) by mouth 2 (two) times daily as needed for cough. (Patient not taking: Reported on 07/11/2024) 20 capsule 0   Current Facility-Administered Medications  Medication Dose Route Frequency Provider Last Rate Last Admin   lidocaine  (XYLOCAINE ) 1 % (with pres) injection 1 mL  1 mL Other Once        lidocaine  (XYLOCAINE ) 1 % (with pres) injection 4 mL  4 mL Other Once        triamcinolone  acetonide (KENALOG -40) injection 40 mg  40 mg Intramuscular Once        triamcinolone  acetonide (KENALOG -40) injection 40 mg  40 mg Intra-articular Once          VITALS:   Blood pressure 122/73, pulse 89, temperature 98 F (36.7 C), temperature source Temporal, resp. rate 17, height 5' 2 (1.575 m), weight 202 lb (91.6 kg), SpO2 98%.  Wt Readings from Last 3 Encounters:  07/11/24 202 lb (91.6 kg)  07/04/24 201 lb 6.4 oz (91.4 kg)  06/25/24 205 lb 6.4 oz (93.2 kg)    Body mass index is 36.95 kg/m.    Onc Performance Status - 07/11/24 0900       ECOG Perf Status   ECOG Perf Status Capable of only limited selfcare, confined to bed or chair more than 50% of waking hours      KPS SCALE   KPS % SCORE Requires considerable assistance, and frequent medical care             PHYSICAL EXAM:   Physical Exam Constitutional:      General: She is not in acute distress.    Appearance: Normal appearance.  HENT:     Head: Normocephalic and atraumatic.  Cardiovascular:     Rate and Rhythm: Normal rate.  Pulmonary:     Effort: Pulmonary effort is normal. No respiratory distress.  Abdominal:     General: There is no  distension.  Neurological:     General: No focal deficit present.     Mental Status: She is alert and oriented to person, place, and time.  Psychiatric:  Mood and Affect: Mood normal.        Behavior: Behavior normal.       LABORATORY DATA:   I have reviewed the data as listed.  Results for orders placed or performed in visit on 07/11/24  CBC with Differential (Cancer Center Only)  Result Value Ref Range   WBC Count 10.8 (H) 4.0 - 10.5 K/uL   RBC 3.47 (L) 3.87 - 5.11 MIL/uL   Hemoglobin 9.2 (L) 12.0 - 15.0 g/dL   HCT 69.7 (L) 63.9 - 53.9 %   MCV 87.0 80.0 - 100.0 fL   MCH 26.5 26.0 - 34.0 pg   MCHC 30.5 30.0 - 36.0 g/dL   RDW 83.1 (H) 88.4 - 84.4 %   Platelet Count 459 (H) 150 - 400 K/uL   nRBC 0.4 (H) 0.0 - 0.2 %   Neutrophils Relative % 70 %   Neutro Abs 7.7 1.7 - 7.7 K/uL   Lymphocytes Relative 19 %   Lymphs Abs 2.0 0.7 - 4.0 K/uL   Monocytes Relative 8 %   Monocytes Absolute 0.9 0.1 - 1.0 K/uL   Eosinophils Relative 1 %   Eosinophils Absolute 0.1 0.0 - 0.5 K/uL   Basophils Relative 1 %   Basophils Absolute 0.1 0.0 - 0.1 K/uL   Immature Granulocytes 1 %   Abs Immature Granulocytes 0.09 (H) 0.00 - 0.07 K/uL  CMP (Cancer Center only)  Result Value Ref Range   Sodium 140 135 - 145 mmol/L   Potassium 3.7 3.5 - 5.1 mmol/L   Chloride 109 98 - 111 mmol/L   CO2 21 (L) 22 - 32 mmol/L   Glucose, Bld 115 (H) 70 - 99 mg/dL   BUN 12 8 - 23 mg/dL   Creatinine 9.19 9.55 - 1.00 mg/dL   Calcium  9.6 8.9 - 10.3 mg/dL   Total Protein 7.3 6.5 - 8.1 g/dL   Albumin 3.9 3.5 - 5.0 g/dL   AST 56 (H) 15 - 41 U/L   ALT 80 (H) 0 - 44 U/L   Alkaline Phosphatase 99 38 - 126 U/L   Total Bilirubin 0.3 0.0 - 1.2 mg/dL   GFR, Estimated >39 >39 mL/min   Anion gap 11 5 - 15       RADIOGRAPHIC STUDIES:  CT ABD PELVIS W/WO CM ONCOLOGY PANCREATIC PROTOCOL Result Date: 06/28/2024 CLINICAL DATA:  Restaging pancreatic cancer. Currently undergoing neoadjuvant chemotherapy. Assess  treatment response. EXAM: CT ABDOMEN AND PELVIS WITHOUT AND WITH CONTRAST TECHNIQUE: Multidetector CT imaging of the abdomen and pelvis was performed following the standard protocol before and following the bolus administration of intravenous contrast. RADIATION DOSE REDUCTION: This exam was performed according to the departmental dose-optimization program which includes automated exposure control, adjustment of the mA and/or kV according to patient size and/or use of iterative reconstruction technique. CONTRAST:  OMNIPAQUE  IOHEXOL  300 MG/ML  SOLN COMPARISON:  PET-CT 03/11/2024 and CT scan 02/05/2024 FINDINGS: Lower chest: The lung bases are clear of acute process. No pleural effusion or pulmonary lesions. The heart is normal in size. No pericardial effusion. The distal esophagus and aorta are unremarkable. Hepatobiliary: Stable hepatic cysts. No worrisome hepatic lesions to suggest metastatic disease. The gallbladder is surgically absent. No biliary dilatation. Pancreas: The pancreatic body lesion is very difficult to accurately identify or measure and is a very subtle infiltrative lesion causing obstruction of the main pancreatic duct and upstream dilatation and pancreatic atrophy. My best estimate is that lesion measures 19 x 17 mm on image number  12 of series 9. This would correlate with the remeasured lesion on the prior study from 02/05/2024. On the prior MRI examination this measured approximately 18 x 17 mm. It looks much larger on the PET-CT but again it is very difficult to discretely identify or measure. Overall I do not see any findings for progression. Spleen: Normal size.  No focal lesions. Adrenals/Urinary Tract: Adrenal glands and kidneys are unremarkable and stable. Stomach/Bowel: Small hiatal hernia. Stomach is otherwise unremarkable. The duodenum, small bowel and colon are unremarkable. Stable sigmoid colon diverticulosis. Vascular/Lymphatic: The aorta and branch vessels are patent. Stable  atherosclerotic calcifications. The major venous structures are patent. No periportal or peripancreatic adenopathy. No mesenteric or retroperitoneal adenopathy. Reproductive: Surgically absent. Other: No pelvic mass or adenopathy. No free pelvic fluid collections. No inguinal mass or adenopathy. No abdominal wall hernia or subcutaneous lesions. Musculoskeletal: No significant bony findings. Stable scoliosis and degenerative lumbar spondylosis. IMPRESSION: 1. The pancreatic body lesion is very difficult to accurately identify or measure. I do not see any change in size when compared to prior CT and MRI examinations. It did appear much larger on the PET-CT. 2. No findings for metastatic disease involving the abdomen/pelvis. 3. Stable hepatic cysts. 4. Status post cholecystectomy. No biliary dilatation. 5. Aortic atherosclerosis. Aortic Atherosclerosis (ICD10-I70.0). Electronically Signed   By: MYRTIS Stammer M.D.   On: 06/28/2024 20:59   CT Angio Chest PE W/Cm &/Or Wo Cm Result Date: 06/24/2024 CLINICAL DATA:  Shortness of breath and hemoptysis. History of prior pulmonary embolism and pancreatic carcinoma. EXAM: CT ANGIOGRAPHY CHEST WITH CONTRAST TECHNIQUE: Multidetector CT imaging of the chest was performed using the standard protocol during bolus administration of intravenous contrast. Multiplanar CT image reconstructions and MIPs were obtained to evaluate the vascular anatomy. RADIATION DOSE REDUCTION: This exam was performed according to the departmental dose-optimization program which includes automated exposure control, adjustment of the mA and/or kV according to patient size and/or use of iterative reconstruction technique. CONTRAST:  75mL OMNIPAQUE  IOHEXOL  350 MG/ML SOLN COMPARISON:  10/04/2023 FINDINGS: Cardiovascular: Previous bilateral pulmonary embolism has completely resolved since the prior CTA with no residual chronic thrombus remaining. No acute pulmonary embolism identified. Normal caliber central  pulmonary arteries. Normal caliber thoracic aorta without dissection. Normal heart size. No pericardial fluid. Stable mild calcified coronary artery plaque. Mediastinum/Nodes: No enlarged mediastinal, hilar, or axillary lymph nodes. Thyroid  gland, trachea, and esophagus demonstrate no significant findings. Lungs/Pleura: There is no evidence of pulmonary edema, consolidation, pneumothorax, nodule or pleural fluid. Upper Abdomen: Stable benign hepatic cyst. Musculoskeletal: No chest wall abnormality. No acute or significant osseous findings. Review of the MIP images confirms the above findings. IMPRESSION: 1. Complete resolution of previous bilateral pulmonary embolism. No acute pulmonary embolism identified. 2. Stable mild calcified coronary artery plaque. 3. Stable benign hepatic cyst. Electronically Signed   By: Marcey Moan M.D.   On: 06/24/2024 12:51   DG Chest 1 View Result Date: 06/24/2024 CLINICAL DATA:  Hemoptysis and shortness of breath. EXAM: CHEST  1 VIEW COMPARISON:  10/04/2023 FINDINGS: Stable heart size and aortic tortuosity. Interval right jugular Port-A-Cath placement with catheter tip at the SVC/RA junction. There is no evidence of pulmonary edema, consolidation, pneumothorax or pleural fluid. IMPRESSION: No acute findings. Interval right jugular Port-A-Cath placement. Electronically Signed   By: Marcey Moan M.D.   On: 06/24/2024 10:23     CODE STATUS:  Code Status History     Date Active Date Inactive Code Status Order ID Comments User Context  10/05/2023 0213 10/06/2023 1942 Full Code [id number]  Sundil, Subrina, MD ED    Questions for Most Recent Historical Code Status (Order [id number])     Question Answer   By: Consent: discussion documented in EHR            No orders of the defined types were placed in this encounter.    Future Appointments  Date Time Provider Department Center  08/25/2024  2:40 PM Urbano Albright, MD [facility]  02/25/2025  1:40 PM  [facility]NUAL WELLNESS VISIT [facility] [address]  06/10/2025  7:45 AM Gayland Lauraine PARAS, NP [facility] None      This document was completed utilizing speech recognition software. Grammatical errors, random word insertions, pronoun errors, and incomplete sentences are an occasional consequence of this system due to software limitations, ambient noise, and hardware issues. Any formal questions or concerns about the content, text or information contained within the body of this dictation should be directly addressed to the provider for clarification.   "

## 2024-07-11 NOTE — Progress Notes (Signed)
 Nutrition Follow-up:  Pt stage II pancreatic cancer. She is receiving neoadjuvant gemcitabine /abraxane  (first 9/19). Patient is under the care of Dr. Autumn   12/2 - ED evaluation for bronchitis  Treatment held per pt request - receiving supportive therapy with IVF  Met with patient in infusion. Reports productive cough with horrible tasting phlegm. This is getting better. Patient reports appetite is also improving. Did not want to eat much when sick as everything tasted bad.   Medications: reviewed   Labs: reviewed   Anthropometrics: Wt 202 lb today   12/12 - 201 lb 6.4 oz 11/28 - 208 lb 8 oz    NUTRITION DIAGNOSIS: Food and nutrition related knowledge deficit improving    INTERVENTION:  Encourage 4-6 small meals/snacks with adequate calories and protein     MONITORING, EVALUATION, GOAL: wt trends, intake   NEXT VISIT: To be scheduled as needed

## 2024-07-11 NOTE — Patient Instructions (Signed)
 Fluids Given Through an IV (IV Infusion Therapy): What to Expect IV infusion therapy is a treatment to deliver a fluid, called an infusion, into a vein. You may have IV infusion to get: Fluids. Medicines. Nutrition. Chemotherapy. This is medicines to stop or slow down cancer cells. Blood or blood products. Dye that is given before an MRI or a CT scan. This is called contrast dye. Tell a health care provider about: Any allergies you have. This includes allergies to anesthesia or dyes. All medicines you take. These include vitamins, herbs, eye drops, and creams. Any bleeding problems you have. Any surgeries you've had, including if you've had lymph nodes taken out of your armpit or if you have a arteriovenous fistula for dialysis. Any medical problems you have. Whether you're pregnant or may be pregnant. Whether you've used IV drugs. What are the risks? Your health care provider will talk with you about risks. These may include: Pain, bruising, or bleeding. Infection. The IV leaking or moving out of place. Damage to blood vessels or nerves. Allergic reactions to medicines or dyes. A blood clot. An air bubble in the vein, also called an air embolism. What happens before the procedure? Eat and drink only as you've been told. Ask about changing or stopping: Any medicines you take. Any vitamins, herbs, or supplements you take. What happens during the procedure?     Placing the catheter Your skin at the IV site will be washed with fluid that kills germs. This will help prevent infection. IV infusion therapy starts with a procedure to place a soft tube called a catheter into a vein. An IV tube will be attached to the catheter to let the infusion flow into your blood. Your catheter may be placed: Into a vein that is usually in the bend of the elbow, forearm, or back of the hand. This is called a peripheral IV catheter. This may need to be put into a vein each time you get an  infusion. Into a vein near your elbow. This is called a midline catheter or a peripherally inserted central catheter (PICC). These types of catheters may stay in place for weeks or months at a time so you can receive repeated infusions through it. Into a vein near your neck that leads to your heart. This is called a non-tunneled catheter. This is only used for short amounts of time because it can cause infection. Through the skin of your chest and into a large vein that leads to your heart. This is called a tunneled catheter. This may stay in your body for months or years. Into an implanted port. An implanted port is a device that is surgically inserted under the skin of the chest to provide long-term IV access. The catheter will connect the port to a large vein in the chest or upper arm. A port may be kept in place for many months or years. Each time you have an infusion, a needle will be inserted through your skin to connect the catheter to the port. Doing the infusion To start the infusion, your provider will: Attach the IV tubing to your catheter. Use a tape or a bandage to hold the IV in place against your skin. An IV pump may be used to control the flow of the IV infusion. During the infusion, your provider will check the area to make sure: There is no bleeding, swelling, or pain. Your IV infusion is flowing correctly. After the infusion, your provider will: Take off the bandage  or tape. Disconnect the tubing from the catheter. Remove the catheter, if you have a peripheral IV. Apply pressure over the IV insertion site to stop bleeding, then cover the area with a bandage. If you have an implanted port, PICC, non-tunneled, or tunneled catheter, your catheter may remain in place. This depends on how many times you will need treatment, your medical condition, and what type of catheter you have. These steps may vary. Ask what you can expect. What can I expect after the procedure? You may be  watched closely until you leave. This includes checking your pain level, blood pressure, heart rate, and breathing rate. Your provider will check to make sure there are no signs of infection. Follow these instructions at home: Take your medicines only as told. Change or take off your bandage as told by your provider. Ask what things are safe for you to do at home. Ask when you can go back to work or school. Do not take baths, swim, or use a hot tub until you're told it's OK. Ask if you can shower. Check your IV insertion site every day for signs of infection. Check for: Redness, swelling, or pain. Fluid or blood. If fluid or blood drains from your IV site, use your hands to press down firmly on the area for a minute or two. Doing this should stop the bleeding. Warmth. Pus or a bad smell. Contact a health care provider if: You have signs of infection around your IV site. You have fluid or blood coming from your IV site that does not stop after you put pressure to the site. You have a rash or blisters. You have itchy, red, swollen areas of skin called hives. Get help right away if: You have a fever or chills. You have chest pain. You have trouble breathing. This information is not intended to replace advice given to you by your health care provider. Make sure you discuss any questions you have with your health care provider. Document Revised: 01/02/2023 Document Reviewed: 01/02/2023 Elsevier Patient Education  2024 [employer].

## 2024-07-11 NOTE — Assessment & Plan Note (Addendum)
 Please review oncology history for additional details and timeline of events.  Her case was discussed in GI tumor conference on 03/19/2024. Uncertainty remains regarding the second spot in the gastrohepatic ligament, which may be post-biopsy inflammation rather than cancer.   Genetic testing came back negative for any deleterious germline mutations.  Patient had consultation with Dr. 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  The location of this does appear to be relatively central.  It is unclear to me whether this is proximal enough in the body that would require a Whipple or a distal pancreas.  It is not well-seen on imaging.  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 We will start with neoadjuvant chemotherapy.  I will then get repeat imaging.  I do want a pancreatic protocol follow-up scan.   Plan made for neoadjuvant chemotherapy with gemcitabine  and Abraxane , dose reduced for her age and comorbidities, followed by surgical evaluation.  Initially will plan for chemotherapy on days 1, 15 of 28-day cycle and if well-tolerated, we will attempt to do chemo on days 1, 8, 15 of 28-day cycle.  We started with 20% dose reduction of gemcitabine  and Abraxane .  Increased to standard dosing from cycle 2 onwards.  She has been tolerating chemotherapy reasonably well without major side effects.  Had mild nausea for couple of days after chemotherapy but no other major issues.  For the first 2 cycles, she received chemotherapy on days 1, 15 of 28-day cycle.  Since she was tolerating chemotherapy very well without major side effects, we attempted to increase the frequency to days 1, 8, 15 of 28-day cycle with cycle 3.  Following completion of cycle 3, she had significant fatigue, shortness of breath, decreased appetite, mouth sores and other side effects that led to ED visit.  She did not have her to be hospitalized.     Restaging CT pancreatic protocol on 06/23/2024 showed no significant change compared to prior.  No new disease.  Going forward, we will reduce the frequency of treatments to days 1, 15 of 28-day cycle.  Will consider dose reduction of chemotherapy further depending on symptoms.  Patient still has fatigue, decreased appetite and is recovering from recent bronchitis episode. Karnofsky Performance Status is 50, requiring considerable assistance with activities of daily living. She is not a surgical candidate. The primary concern is her current low energy and functional status, which must improve before resuming chemotherapy.  Hence we will take a break from chemotherapy today.  She was given IV hydration in clinic today.  Currently due for cycle 4-day 1 of chemotherapy.  Will reevaluate in 1 week for possible resumption of chemotherapy and we will consider dose reduction by 20%.

## 2024-07-14 ENCOUNTER — Ambulatory Visit

## 2024-07-18 ENCOUNTER — Encounter: Payer: Self-pay | Admitting: Oncology

## 2024-07-18 ENCOUNTER — Inpatient Hospital Stay

## 2024-07-18 ENCOUNTER — Inpatient Hospital Stay: Admitting: Oncology

## 2024-07-18 ENCOUNTER — Other Ambulatory Visit: Payer: Self-pay

## 2024-07-18 VITALS — BP 104/60 | HR 84 | Temp 97.6°F | Resp 17 | Ht 62.0 in | Wt 206.0 lb

## 2024-07-18 DIAGNOSIS — C259 Malignant neoplasm of pancreas, unspecified: Secondary | ICD-10-CM

## 2024-07-18 DIAGNOSIS — Z5111 Encounter for antineoplastic chemotherapy: Secondary | ICD-10-CM | POA: Diagnosis not present

## 2024-07-18 LAB — CMP (CANCER CENTER ONLY)
ALT: 36 U/L (ref 0–44)
AST: 32 U/L (ref 15–41)
Albumin: 3.9 g/dL (ref 3.5–5.0)
Alkaline Phosphatase: 96 U/L (ref 38–126)
Anion gap: 10 (ref 5–15)
BUN: 10 mg/dL (ref 8–23)
CO2: 21 mmol/L — ABNORMAL LOW (ref 22–32)
Calcium: 9.4 mg/dL (ref 8.9–10.3)
Chloride: 109 mmol/L (ref 98–111)
Creatinine: 0.74 mg/dL (ref 0.44–1.00)
GFR, Estimated: >60 mL/min
Glucose, Bld: 154 mg/dL — ABNORMAL HIGH (ref 70–99)
Potassium: 3.5 mmol/L (ref 3.5–5.1)
Sodium: 140 mmol/L (ref 135–145)
Total Bilirubin: 0.3 mg/dL (ref 0.0–1.2)
Total Protein: 6.7 g/dL (ref 6.5–8.1)

## 2024-07-18 LAB — CBC WITH DIFFERENTIAL (CANCER CENTER ONLY)
Abs Immature Granulocytes: 0.02 K/uL (ref 0.00–0.07)
Basophils Absolute: 0.1 K/uL (ref 0.0–0.1)
Basophils Relative: 1 %
Eosinophils Absolute: 0.2 K/uL (ref 0.0–0.5)
Eosinophils Relative: 3 %
HCT: 30.1 % — ABNORMAL LOW (ref 36.0–46.0)
Hemoglobin: 9.1 g/dL — ABNORMAL LOW (ref 12.0–15.0)
Immature Granulocytes: 0 %
Lymphocytes Relative: 23 %
Lymphs Abs: 1.5 K/uL (ref 0.7–4.0)
MCH: 26.7 pg (ref 26.0–34.0)
MCHC: 30.2 g/dL (ref 30.0–36.0)
MCV: 88.3 fL (ref 80.0–100.0)
Monocytes Absolute: 0.6 K/uL (ref 0.1–1.0)
Monocytes Relative: 9 %
Neutro Abs: 4.4 K/uL (ref 1.7–7.7)
Neutrophils Relative %: 64 %
Platelet Count: 215 K/uL (ref 150–400)
RBC: 3.41 MIL/uL — ABNORMAL LOW (ref 3.87–5.11)
RDW: 17.5 % — ABNORMAL HIGH (ref 11.5–15.5)
WBC Count: 6.7 K/uL (ref 4.0–10.5)
nRBC: 0 % (ref 0.0–0.2)

## 2024-07-18 MED ORDER — PROCHLORPERAZINE MALEATE 10 MG PO TABS
10.0000 mg | ORAL_TABLET | Freq: Once | ORAL | Status: AC
  Administered 2024-07-18: 10 mg via ORAL
  Filled 2024-07-18: qty 1

## 2024-07-18 MED ORDER — SODIUM CHLORIDE 0.9 % IV SOLN: INTRAVENOUS | Status: DC

## 2024-07-18 MED ORDER — PACLITAXEL PROTEIN-BOUND CHEMO INJECTION 100 MG
123.0000 mg/m2 | Freq: Once | INTRAVENOUS | Status: AC
  Administered 2024-07-18: 250 mg via INTRAVENOUS
  Filled 2024-07-18: qty 50

## 2024-07-18 MED ORDER — SODIUM CHLORIDE 0.9 % IV SOLN
1000.0000 mg/m2 | Freq: Once | INTRAVENOUS | Status: AC
  Administered 2024-07-18: 2052 mg via INTRAVENOUS
  Filled 2024-07-18: qty 53.97

## 2024-07-18 NOTE — Patient Instructions (Signed)
 [facility] - A DEPT OF [facility]   Discharge Instructions: Thank you for choosing [facility] to provide your oncology and hematology care.   If you have a lab appointment with the Cancer Center, please go directly to the Cancer Center and check in at the registration area.   Wear comfortable clothing and clothing appropriate for easy access to any Portacath or PICC line.   We strive to give you quality time with your provider. You may need to reschedule your appointment if you arrive late (15 or more minutes).  Arriving late affects you and other patients whose appointments are after yours.  Also, if you miss three or more appointments without notifying the office, you may be dismissed from the clinic at the provider's discretion.      For prescription refill requests, have your pharmacy contact our office and allow 72 hours for refills to be completed.    Today you received the following chemotherapy and/or immunotherapy agents: Paclitaxel protein-bound (Abraxane) and gemcitabine (Gemzar)      To help prevent nausea and vomiting after your treatment, we encourage you to take your nausea medication as directed.  BELOW ARE SYMPTOMS THAT SHOULD BE REPORTED IMMEDIATELY: *FEVER GREATER THAN 100.4 F (38 C) OR HIGHER *CHILLS OR SWEATING *NAUSEA AND VOMITING THAT IS NOT CONTROLLED WITH YOUR NAUSEA MEDICATION *UNUSUAL SHORTNESS OF BREATH *UNUSUAL BRUISING OR BLEEDING *URINARY PROBLEMS (pain or burning when urinating, or frequent urination) *BOWEL PROBLEMS (unusual diarrhea, constipation, pain near the anus) TENDERNESS IN MOUTH AND THROAT WITH OR WITHOUT PRESENCE OF ULCERS (sore throat, sores in mouth, or a toothache) UNUSUAL RASH, SWELLING OR PAIN  UNUSUAL VAGINAL DISCHARGE OR ITCHING   Items with * indicate a potential emergency and should be followed up as soon as possible or go to the Emergency Department if any problems should  occur.  Please show the CHEMOTHERAPY ALERT CARD or IMMUNOTHERAPY ALERT CARD at check-in to the Emergency Department and triage nurse.  Should you have questions after your visit or need to cancel or reschedule your appointment, please contact [facility] - A DEPT OF [facility]  Dept: [phone]  and follow the prompts.  Office hours are 8:00 a.m. to 4:30 p.m. Monday - Friday. Please note that voicemails left after 4:00 p.m. may not be returned until the following business day.  We are closed weekends and major holidays. You have access to a nurse at all times for urgent questions. Please call the main number to the clinic Dept: [phone] and follow the prompts.   For any non-urgent questions, you may also contact your provider using MyChart. We now offer e-Visits for anyone 24 and older to request care online for non-urgent symptoms. For details visit [email or web address].   Also download the MyChart app! Go to the app store, search MyChart, open the app, select [facility], and log in with your MyChart username and password.

## 2024-07-18 NOTE — Progress Notes (Signed)
 "  [facility]  ONCOLOGY CLINIC PROGRESS NOTE   Patient Care Team: [name], MD as PCP - General (Family Medicine) Mansouraty, Aloha Raddle., MD as Consulting Physician (Gastroenterology)  PATIENT NAME: Gina Key   MR#: [id number] DOB: 1945/09/15  Date of visit: 07/18/2024   ASSESSMENT & PLAN:   Gina Key is a 78 y.o. lady with a past medical history of hypertension, history of pulmonary embolism in March 2025, seizure disorder, obstructive sleep apnea, arthritis, GERD, was referred to our clinic in August 2025 for pancreatic adenocarcinoma.   Pancreatic adenocarcinoma [facility]) Please review oncology history for additional details and timeline of events.  Her case was discussed in GI tumor conference on 03/19/2024. Uncertainty remains regarding the second spot in the gastrohepatic ligament, which may be post-biopsy inflammation rather than cancer.   Genetic testing came back negative for any deleterious germline mutations.  Patient had consultation with Dr. 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  The location of this does appear to be relatively central.  It is unclear to me whether this is proximal enough in the body that would require a Whipple or a distal pancreas.  It is not well-seen on imaging.  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 We will start with neoadjuvant chemotherapy.  I will then get repeat imaging.  I do want a pancreatic protocol follow-up scan.   Plan made for neoadjuvant chemotherapy with gemcitabine  and Abraxane , dose reduced for her age and comorbidities, followed by surgical evaluation.  Initially will plan for chemotherapy on days 1, 15 of 28-day cycle and if well-tolerated, we will attempt to do chemo on days 1, 8, 15 of 28-day cycle.  We started with 20% dose reduction of gemcitabine  and Abraxane .  Increased to standard dosing from cycle  2 onwards.  For the first 2 cycles, she received chemotherapy on days 1, 15 of 28-day cycle.  Since she was tolerating chemotherapy very well without major side effects, we attempted to increase the frequency to days 1, 8, 15 of 28-day cycle with cycle 3.  Following completion of cycle 3, she had significant fatigue, shortness of breath, decreased appetite, mouth sores and other side effects that led to ED visit.  She did not have her to be hospitalized.    Restaging CT pancreatic protocol on 06/23/2024 showed no significant change compared to prior.  No new disease.  Going forward, we will reduce the frequency of treatments to days 1, 15 of 28-day cycle.  Will consider dose reduction of chemotherapy further depending on symptoms.  Currently due for cycle 4-day 1 of chemotherapy.  Patient is back to her baseline and is feeling well.  She is willing to resume chemotherapy from today.  Going forward we will give her standard chemotherapy on days 1, 15 of 28-day cycle.  Labs reveal no dose-limiting toxicities today.  LFTs normalized.  RTC in 2 weeks for cycle 4-day 15 of chemotherapy.   Fatigue and anorexia Significant fatigue and anorexia with 12-pound weight loss and decreased oral intake. Energy level is approximately 50% of baseline, requiring assistance with activities of daily living. Symptoms attributed to both chemotherapy and recent bronchitis. Fatigue is the primary reason for delaying the next chemotherapy cycle. - Held chemotherapy this week to allow for recovery and improvement in energy and nutritional status. - Advised to focus on improving oral intake, hydration, and activity level at  home over the next week. - Planned to reassess energy and functional status at next visit before resuming chemotherapy.  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 Blood counts are well-managed. - Continue Eliquis  for anticoagulation. -  Monitor blood counts regularly during chemotherapy.   I reviewed lab results and outside records for this visit and discussed relevant results with the patient. Diagnosis, plan of care and treatment options were also discussed in detail with the patient. Opportunity provided to ask questions and answers provided to her apparent satisfaction. Provided instructions to call our clinic with any problems, questions or concerns prior to return visit. I recommended to continue follow-up with PCP and sub-specialists. She verbalized understanding and agreed with the plan.   NCCN guidelines have been consulted in the planning of this patients care.  I spent a total of 42 minutes during this encounter with the patient including review of chart and various tests results, discussions about plan of care and coordination of care plan.   [name], MD  07/18/2024 3:31 PM  [facility] - A DEPT OF [facility] [address] [place] [address] Dept: [phone] Dept Fax: [phone]    CHIEF COMPLAINT/ REASON FOR VISIT:   Adenocarcinoma of pancreas, Stage I vs stage II  Current Treatment: Plan for neoadjuvant chemotherapy with dose reduce gemcitabine  and Abraxane , followed by surgical evaluation.  Started systemic treatment with gemcitabine  and Abraxane  from 04/11/2024.  INTERVAL HISTORY:    Discussed the use of AI scribe software for clinical note transcription with the patient, who gave verbal consent to proceed.  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  She reports new bilateral lower extremity edema, with swelling in both feet present upon waking and persisting throughout the day with activity. She denies  associated symptoms such as cough or other acute complaints. No new hematologic or oncologic symptoms are reported aside from the edema.  She notes overall improvement compared to the previous week, including increased appetite and general well-being. She is able to eat well and is gradually regaining strength, though she continues to experience some weakness with ambulation. She is able to walk around the house and has been more active.  Her last chemotherapy session was on June 21, 2024. She expresses interest in maintaining flexibility in her treatment schedule for personal events in April.   I have reviewed the past medical history, past surgical history, social history and family history with the patient and they are unchanged from previous note.  HISTORY OF PRESENT ILLNESS:   ONCOLOGY HISTORY:   On review of the previous records patient was evaluated in the emergency department on 02/05/2024 for gross hematuria.  CT abdomen pelvis was performed which showed moderate dilation of the pancreatic duct in the mid body and tail overlying and parenchymal atrophy. MR abdomen was ordered by PCP Dr. Mercer and performed on 02/07/24. MRI showed a focal segment of pancreatic ductal dilatation with abrupt caliber change and stricture. Subtle mass lesion in this location identified measuring 17 x 15 mm at the level of the caliber change and has appearance worrisome for neoplasm such as an adenocarcinoma. Image did not show any other areas concerning for malignant disease.    CMP collected 02/05/24 in the ED  was overall unremarkable. Further chart review shows patient was diagnosed with a submassive PE with right heart strain 10/05/23 and is currently taking Eliquis .     She is retired, having worked in associate professor. She does not smoke or drink alcohol. She currently lives with her daughter due to past seizures. Her last colonoscopy in February 2024 was normal, as was her last mammogram. Her family  history is significant for her mother having pancreatic cancer at age 33, her brother having throat cancer, and her daughter having kidney cancer.   She was seen in our rapid diagnostic clinic on 02/12/2024 for consultation and was referred to GI for EUS/biopsy.  Labs on that day showed a CA 19-9 of 8, CEA undetectable, chromogranin A increased at 224.8 ng/mL.  CBCD and CMP were unremarkable with normal LFTs.   On 03/03/2024 Dr. Wilhelmenia performed EGD and EUS.  On EUS, mass was identified in the pancreatic body.  Endosonographic appearance was highly suspicious for adenocarcinoma.  Staged as T1c, N0, MX.  FNA performed.  There was no sign of significant pathology in the CBD and in the common hepatic duct.  No malignant appearing lymph nodes were visualized in the celiac region, peripancreatic region and porta hepatis.  A cyst was found in the visualized portion of the liver measured 26 mm x 23 mm.  No evidence of any solid mass lesions in the left lobe of liver.   FNA from pancreatic mass came back positive for adenocarcinoma.   On 03/11/2024, staging PET scan showed ill-defined heterogeneous low-level hypermetabolic in the mid pancreas, corresponding to the abnormality on previous MRI.  Features compatible with biopsy-proven pancreatic adenocarcinoma.  16 x 11 mm focus of amorphous soft tissue in the gastrohepatic ligament was hypermetabolic and concerning for metastatic disease.  No evidence of hypermetabolic liver metastasis.  No evidence of metastatic disease elsewhere.   Her case was discussed in GI tumor conference on 03/19/2024. Uncertainty remains regarding the second spot in the gastrohepatic ligament, which may be post-biopsy inflammation rather than cancer.   Genetic testing came back negative for any deleterious germline mutations.  Patient had consultation with Dr. 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  The location of this does appear to be relatively  central.  It is unclear to me whether this is proximal enough in the body that would require a Whipple or a distal pancreas.  It is not well-seen on imaging.  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 We will start with neoadjuvant chemotherapy.  I will then get repeat imaging.  I do want a pancreatic protocol follow-up scan.   Plan for neoadjuvant chemotherapy with gemcitabine  and Abraxane , dose reduced for her age and comorbidities, followed by surgical evaluation.  Started systemic treatment with gemcitabine  and Abraxane  from 04/11/2024.  For the first 2 cycles, she received chemotherapy on days 1, 15 of 28-day cycle.  Since she was tolerating chemotherapy very well without major side effects, we attempted to increase the frequency to days 1, 8, 15 of 28-day cycle with cycle 3.  Following completion of cycle 3, she had significant fatigue, shortness of breath, decreased appetite, mouth sores and other side effects that led to ED visit.  She did not have her to be hospitalized.    Restaging CT pancreatic protocol on 06/23/2024 showed no significant change compared to prior.  No new disease.  Going forward, we will reduce the  frequency of treatments to days 1, 15 of 28-day cycle.  Will consider dose reduction of chemotherapy further depending on symptoms.  Oncology History  Pancreatic adenocarcinoma (HCC)  03/03/2024 Initial Diagnosis   Pancreatic adenocarcinoma (HCC)   03/21/2024 Cancer Staging   Staging form: Exocrine Pancreas, AJCC 8th Edition - Clinical: Stage IIB (cT1c, cN1, cM0) - Signed by Autumn Millman, MD on 04/11/2024   03/31/2024 Genetic Testing   Negative genetic testing. No pathogenic variants identified on the [facility] CancerNext-Expanded+RNA Panel. The report date is 03/31/2024.   The CancerNext-Expanded gene panel offered by [facility] and includes sequencing, rearrangement, and RNA analysis for the following 77 genes: AIP,  ALK, APC, ATM, AXIN2, BAP1, BARD1, BMPR1A, BRCA1, BRCA2, BRIP1, CDC73, CDH1, CDK4, CDKN1B, CDKN2A, CEBPA, CHEK2, CTNNA1, DDX41, DICER1, ETV6, FH, FLCN, GATA2, LZTR1, MAX, MBD4, MEN1, MET, MLH1, MSH2, MSH3, MSH6, MUTYH, NF1, NF2, NTHL1, PALB2, PHOX2B, PMS2, POT1, PRKAR1A, PTCH1, PTEN, RAD51C, RAD51D, RB1, RET, RPS20, RUNX1, SDHA, SDHAF2, SDHB, SDHC, SDHD, SMAD4, SMARCA4, SMARCB1, SMARCE1, STK11, SUFU, TMEM127, TP53, TSC1, TSC2, VHL, and WT1 (sequencing and deletion/duplication); EGFR, HOXB13, KIT, MITF, PDGFRA, POLD1, and POLE (sequencing only); EPCAM and GREM1 (deletion/duplication only).    04/11/2024 -  Chemotherapy   Patient is on Treatment Plan : PANCREATIC Abraxane  D1,15 + Gemcitabine  D1,15 q28d         REVIEW OF SYSTEMS:   Review of Systems - Oncology  All other pertinent systems were reviewed with the patient and are negative.  ALLERGIES: She has no known allergies.  MEDICATIONS:  Current Outpatient Medications  Medication Sig Dispense Refill   atorvastatin  (LIPITOR) 40 MG tablet      ELIQUIS  5 MG TABS tablet TAKE 1 TABLET BY MOUTH TWICE A DAY 180 tablet 1   ferrous sulfate 325 (65 FE) MG tablet Take 325 mg by mouth at bedtime.     Fexofenadine HCl (ALLEGRA PO) Take by mouth.     Incontinence Supply Disposable (WINGS HL ADULT BRIEFS/XL) MISC As needed daily. 100 each 11   ipratropium (ATROVENT ) 0.06 % nasal spray PLACE 2 SPRAYS INTO BOTH NOSTRILS 4 TIMES DAILY. 45 mL 1   levETIRAcetam  (KEPPRA ) 500 MG tablet Take 1 tablet (500 mg total) by mouth 2 (two) times daily. 180 tablet 3   lidocaine  (HM LIDOCAINE  PATCH) 4 % Place 1 patch onto the skin daily as needed.     lidocaine -prilocaine  (EMLA ) cream Apply to affected area once 30 g 3   lipase/protease/amylase (CREON ) 36000 UNITS CPEP capsule Take 2 capsules (72,000 Units total) by mouth 3 (three) times daily with meals. May also take 1 capsule (36,000 Units total) as needed (with snacks - up to 4 snacks daily). 300 capsule 11    losartan  (COZAAR ) 100 MG tablet TAKE 1 TABLET BY MOUTH EVERY DAY 90 tablet 1   metoprolol  succinate (TOPROL -XL) 25 MG 24 hr tablet TAKE 1 TABLET (25 MG TOTAL) BY MOUTH DAILY. 90 tablet 1   montelukast  (SINGULAIR ) 10 MG tablet TAKE 1 TABLET BY MOUTH EVERYDAY AT BEDTIME 90 tablet 1   omeprazole (PRILOSEC) 20 MG capsule TAKE 1 CAPSULE EVERY DAY 90 capsule 1   ondansetron  (ZOFRAN ) 8 MG tablet Take 1 tablet (8 mg total) by mouth every 8 (eight) hours as needed for nausea or vomiting. 30 tablet 1   oxyCODONE  (ROXICODONE ) 5 MG immediate release tablet Take 1-2 tablets (5-10 mg total) by mouth every 12 (twelve) hours as needed for severe pain (pain score 7-10) or moderate pain (pain score 4-6) (1 tablet for  moderate pain, up to 2 tablets for severe). 90 tablet 0   potassium chloride  (KLOR-CON ) 10 MEQ tablet TAKE 1 TABLET BY MOUTH EVERY DAY 90 tablet 0   prochlorperazine  (COMPAZINE ) 10 MG tablet Take 1 tablet (10 mg total) by mouth every 6 (six) hours as needed for nausea or vomiting. 30 tablet 1   rosuvastatin  (CRESTOR ) 20 MG tablet Take 1 tablet (20 mg total) by mouth daily. 90 tablet 3   simethicone (MYLICON) 125 MG chewable tablet Chew 125 mg by mouth every 6 (six) hours as needed for flatulence.     spironolactone  (ALDACTONE ) 25 MG tablet TAKE 1 TABLET (25 MG TOTAL) BY MOUTH DAILY. 90 tablet 1   topiramate  (TOPAMAX ) 100 MG tablet TAKE 1 TABLET BY MOUTH TWICE A DAY 180 tablet 1   benzonatate  (TESSALON ) 100 MG capsule Take 1 capsule (100 mg total) by mouth 2 (two) times daily as needed for cough. (Patient not taking: Reported on 07/11/2024) 20 capsule 0   Current Facility-Administered Medications  Medication Dose Route Frequency Provider Last Rate Last Admin   lidocaine  (XYLOCAINE ) 1 % (with pres) injection 1 mL  1 mL Other Once        lidocaine  (XYLOCAINE ) 1 % (with pres) injection 4 mL  4 mL Other Once        triamcinolone  acetonide (KENALOG -40) injection 40 mg  40 mg Intramuscular Once         triamcinolone  acetonide (KENALOG -40) injection 40 mg  40 mg Intra-articular Once        Facility-Administered Medications Ordered in Other Visits  Medication Dose Route Frequency Provider Last Rate Last Admin   0.9 %  sodium chloride  infusion   Intravenous Continuous Rylee Huestis, MD   Stopped at 07/18/24 1300     VITALS:   Blood pressure 104/60, pulse 84, temperature 97.6 F (36.4 C), temperature source Temporal, resp. rate 17, height 5' 2 (1.575 m), weight 206 lb (93.4 kg), SpO2 100%.  Wt Readings from Last 3 Encounters:  07/18/24 206 lb (93.4 kg)  07/11/24 202 lb (91.6 kg)  07/04/24 201 lb 6.4 oz (91.4 kg)    Body mass index is 37.68 kg/m.         PHYSICAL EXAM:   Physical Exam Constitutional:      General: She is not in acute distress.    Appearance: Normal appearance.  HENT:     Head: Normocephalic and atraumatic.  Cardiovascular:     Rate and Rhythm: Normal rate.  Pulmonary:     Effort: Pulmonary effort is normal. No respiratory distress.  Abdominal:     General: There is no distension.  Neurological:     General: No focal deficit present.     Mental Status: She is alert and oriented to person, place, and time.  Psychiatric:        Mood and Affect: Mood normal.        Behavior: Behavior normal.       LABORATORY DATA:   I have reviewed the data as listed.  Results for orders placed or performed in visit on 07/18/24  CBC with Differential (Cancer Center Only)  Result Value Ref Range   WBC Count 6.7 4.0 - 10.5 K/uL   RBC 3.41 (L) 3.87 - 5.11 MIL/uL   Hemoglobin 9.1 (L) 12.0 - 15.0 g/dL   HCT 69.8 (L) 63.9 - 53.9 %   MCV 88.3 80.0 - 100.0 fL   MCH 26.7 26.0 - 34.0 pg   MCHC 30.2 30.0 -  36.0 g/dL   RDW 82.4 (H) 88.4 - 84.4 %   Platelet Count 215 150 - 400 K/uL   nRBC 0.0 0.0 - 0.2 %   Neutrophils Relative % 64 %   Neutro Abs 4.4 1.7 - 7.7 K/uL   Lymphocytes Relative 23 %   Lymphs Abs 1.5 0.7 - 4.0 K/uL   Monocytes Relative 9 %   Monocytes  Absolute 0.6 0.1 - 1.0 K/uL   Eosinophils Relative 3 %   Eosinophils Absolute 0.2 0.0 - 0.5 K/uL   Basophils Relative 1 %   Basophils Absolute 0.1 0.0 - 0.1 K/uL   Immature Granulocytes 0 %   Abs Immature Granulocytes 0.02 0.00 - 0.07 K/uL  CMP (Cancer Center only)  Result Value Ref Range   Sodium 140 135 - 145 mmol/L   Potassium 3.5 3.5 - 5.1 mmol/L   Chloride 109 98 - 111 mmol/L   CO2 21 (L) 22 - 32 mmol/L   Glucose, Bld 154 (H) 70 - 99 mg/dL   BUN 10 8 - 23 mg/dL   Creatinine 9.25 9.55 - 1.00 mg/dL   Calcium  9.4 8.9 - 10.3 mg/dL   Total Protein 6.7 6.5 - 8.1 g/dL   Albumin 3.9 3.5 - 5.0 g/dL   AST 32 15 - 41 U/L   ALT 36 0 - 44 U/L   Alkaline Phosphatase 96 38 - 126 U/L   Total Bilirubin 0.3 0.0 - 1.2 mg/dL   GFR, Estimated >39 >39 mL/min   Anion gap 10 5 - 15       RADIOGRAPHIC STUDIES:  CT ABD PELVIS W/WO CM ONCOLOGY PANCREATIC PROTOCOL Result Date: 06/28/2024 CLINICAL DATA:  Restaging pancreatic cancer. Currently undergoing neoadjuvant chemotherapy. Assess treatment response. EXAM: CT ABDOMEN AND PELVIS WITHOUT AND WITH CONTRAST TECHNIQUE: Multidetector CT imaging of the abdomen and pelvis was performed following the standard protocol before and following the bolus administration of intravenous contrast. RADIATION DOSE REDUCTION: This exam was performed according to the departmental dose-optimization program which includes automated exposure control, adjustment of the mA and/or kV according to patient size and/or use of iterative reconstruction technique. CONTRAST:  OMNIPAQUE  IOHEXOL  300 MG/ML  SOLN COMPARISON:  PET-CT 03/11/2024 and CT scan 02/05/2024 FINDINGS: Lower chest: The lung bases are clear of acute process. No pleural effusion or pulmonary lesions. The heart is normal in size. No pericardial effusion. The distal esophagus and aorta are unremarkable. Hepatobiliary: Stable hepatic cysts. No worrisome hepatic lesions to suggest metastatic disease. The gallbladder  is surgically absent. No biliary dilatation. Pancreas: The pancreatic body lesion is very difficult to accurately identify or measure and is a very subtle infiltrative lesion causing obstruction of the main pancreatic duct and upstream dilatation and pancreatic atrophy. My best estimate is that lesion measures 19 x 17 mm on image number 12 of series 9. This would correlate with the remeasured lesion on the prior study from 02/05/2024. On the prior MRI examination this measured approximately 18 x 17 mm. It looks much larger on the PET-CT but again it is very difficult to discretely identify or measure. Overall I do not see any findings for progression. Spleen: Normal size.  No focal lesions. Adrenals/Urinary Tract: Adrenal glands and kidneys are unremarkable and stable. Stomach/Bowel: Small hiatal hernia. Stomach is otherwise unremarkable. The duodenum, small bowel and colon are unremarkable. Stable sigmoid colon diverticulosis. Vascular/Lymphatic: The aorta and branch vessels are patent. Stable atherosclerotic calcifications. The major venous structures are patent. No periportal or peripancreatic adenopathy. No mesenteric or  retroperitoneal adenopathy. Reproductive: Surgically absent. Other: No pelvic mass or adenopathy. No free pelvic fluid collections. No inguinal mass or adenopathy. No abdominal wall hernia or subcutaneous lesions. Musculoskeletal: No significant bony findings. Stable scoliosis and degenerative lumbar spondylosis. IMPRESSION: 1. The pancreatic body lesion is very difficult to accurately identify or measure. I do not see any change in size when compared to prior CT and MRI examinations. It did appear much larger on the PET-CT. 2. No findings for metastatic disease involving the abdomen/pelvis. 3. Stable hepatic cysts. 4. Status post cholecystectomy. No biliary dilatation. 5. Aortic atherosclerosis. Aortic Atherosclerosis (ICD10-I70.0). Electronically Signed   By: MYRTIS Stammer M.D.   On:  06/28/2024 20:59   CT Angio Chest PE W/Cm &/Or Wo Cm Result Date: 06/24/2024 CLINICAL DATA:  Shortness of breath and hemoptysis. History of prior pulmonary embolism and pancreatic carcinoma. EXAM: CT ANGIOGRAPHY CHEST WITH CONTRAST TECHNIQUE: Multidetector CT imaging of the chest was performed using the standard protocol during bolus administration of intravenous contrast. Multiplanar CT image reconstructions and MIPs were obtained to evaluate the vascular anatomy. RADIATION DOSE REDUCTION: This exam was performed according to the departmental dose-optimization program which includes automated exposure control, adjustment of the mA and/or kV according to patient size and/or use of iterative reconstruction technique. CONTRAST:  75mL OMNIPAQUE  IOHEXOL  350 MG/ML SOLN COMPARISON:  10/04/2023 FINDINGS: Cardiovascular: Previous bilateral pulmonary embolism has completely resolved since the prior CTA with no residual chronic thrombus remaining. No acute pulmonary embolism identified. Normal caliber central pulmonary arteries. Normal caliber thoracic aorta without dissection. Normal heart size. No pericardial fluid. Stable mild calcified coronary artery plaque. Mediastinum/Nodes: No enlarged mediastinal, hilar, or axillary lymph nodes. Thyroid  gland, trachea, and esophagus demonstrate no significant findings. Lungs/Pleura: There is no evidence of pulmonary edema, consolidation, pneumothorax, nodule or pleural fluid. Upper Abdomen: Stable benign hepatic cyst. Musculoskeletal: No chest wall abnormality. No acute or significant osseous findings. Review of the MIP images confirms the above findings. IMPRESSION: 1. Complete resolution of previous bilateral pulmonary embolism. No acute pulmonary embolism identified. 2. Stable mild calcified coronary artery plaque. 3. Stable benign hepatic cyst. Electronically Signed   By: Marcey Moan M.D.   On: 06/24/2024 12:51   DG Chest 1 View Result Date: 06/24/2024 CLINICAL DATA:   Hemoptysis and shortness of breath. EXAM: CHEST  1 VIEW COMPARISON:  10/04/2023 FINDINGS: Stable heart size and aortic tortuosity. Interval right jugular Port-A-Cath placement with catheter tip at the SVC/RA junction. There is no evidence of pulmonary edema, consolidation, pneumothorax or pleural fluid. IMPRESSION: No acute findings. Interval right jugular Port-A-Cath placement. Electronically Signed   By: Marcey Moan M.D.   On: 06/24/2024 10:23     CODE STATUS:  Code Status History     Date Active Date Inactive Code Status Order ID Comments User Context   10/05/2023 0213 10/06/2023 1942 Full Code [id number]  Lee Kingfisher, MD ED    Questions for Most Recent Historical Code Status (Order [id number])     Question Answer   By: Consent: discussion documented in EHR            No orders of the defined types were placed in this encounter.    Future Appointments  Date Time Provider Department Center  08/01/2024  8:15 AM [facility] FLUSH [facility] None  08/01/2024  8:45 AM Yichen Gilardi, MD [facility] None  08/01/2024  9:00 AM [facility] INFUSION [facility] None  08/22/2024  8:15 AM [facility] FLUSH [facility] None  08/22/2024  8:45 AM Miciah Covelli, Chinita, MD  [facility] None  08/22/2024  9:00 AM [facility] INFUSION [facility] None  08/25/2024  2:40 PM Urbano Albright, MD [facility]  09/05/2024  9:15 AM [facility] FLUSH [facility] None  09/05/2024  9:45 AM Zakk Borgen, Chinita, MD [facility] None  09/05/2024 10:30 AM [facility] INFUSION [facility] None  02/25/2025  1:40 PM [facility] V[facility] [address]  06/10/2025  7:45 AM Gayland Lauraine PARAS, NP [facility] None      This document was completed utilizing speech recognition software. Grammatical errors, random word insertions, pronoun errors, and incomplete sentences are an occasional consequence of this system due to software limitations, ambient noise, and hardware issues. Any formal questions or concerns about the content,  text or information contained within the body of this dictation should be directly addressed to the provider for clarification.   "

## 2024-07-18 NOTE — Assessment & Plan Note (Signed)
 Please review oncology history for additional details and timeline of events.  Her case was discussed in GI tumor conference on 03/19/2024. Uncertainty remains regarding the second spot in the gastrohepatic ligament, which may be post-biopsy inflammation rather than cancer.   Genetic testing came back negative for any deleterious germline mutations.  Patient had consultation with Dr. 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  The location of this does appear to be relatively central.  It is unclear to me whether this is proximal enough in the body that would require a Whipple or a distal pancreas.  It is not well-seen on imaging.  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 We will start with neoadjuvant chemotherapy.  I will then get repeat imaging.  I do want a pancreatic protocol follow-up scan.   Plan made for neoadjuvant chemotherapy with gemcitabine  and Abraxane , dose reduced for her age and comorbidities, followed by surgical evaluation.  Initially will plan for chemotherapy on days 1, 15 of 28-day cycle and if well-tolerated, we will attempt to do chemo on days 1, 8, 15 of 28-day cycle.  We started with 20% dose reduction of gemcitabine  and Abraxane .  Increased to standard dosing from cycle 2 onwards.  For the first 2 cycles, she received chemotherapy on days 1, 15 of 28-day cycle.  Since she was tolerating chemotherapy very well without major side effects, we attempted to increase the frequency to days 1, 8, 15 of 28-day cycle with cycle 3.  Following completion of cycle 3, she had significant fatigue, shortness of breath, decreased appetite, mouth sores and other side effects that led to ED visit.  She did not have her to be hospitalized.    Restaging CT pancreatic protocol on 06/23/2024 showed no significant change compared to prior.  No new disease.  Going forward, we will reduce the frequency of treatments to  days 1, 15 of 28-day cycle.  Will consider dose reduction of chemotherapy further depending on symptoms.  Currently due for cycle 4-day 1 of chemotherapy.  Patient is back to her baseline and is feeling well.  She is willing to resume chemotherapy from today.  Going forward we will give her standard chemotherapy on days 1, 15 of 28-day cycle.  Labs reveal no dose-limiting toxicities today.  LFTs normalized.  RTC in 2 weeks for cycle 4-day 15 of chemotherapy.

## 2024-07-21 ENCOUNTER — Encounter: Payer: Self-pay | Admitting: *Deleted

## 2024-07-25 ENCOUNTER — Other Ambulatory Visit: Payer: Self-pay | Admitting: Family Medicine

## 2024-07-28 ENCOUNTER — Encounter: Admitting: Physical Medicine & Rehabilitation

## 2024-07-29 ENCOUNTER — Telehealth: Payer: Self-pay

## 2024-07-29 NOTE — Telephone Encounter (Signed)
 Copied from CRM [id number]. Topic: Clinical - Medication Question >> Jul 29, 2024  2:16 PM Paige D wrote: Reason for CRM: Pt calling in regards to the   potassium chloride  (KLOR-CON ) 10 MEQ tablet  Pt is waiting for script to be sent over to pharmacy. Please update pt when script has been sent

## 2024-07-30 NOTE — Telephone Encounter (Signed)
 Potassium supplement refilled.

## 2024-07-31 NOTE — Telephone Encounter (Signed)
 Patient is aware.

## 2024-08-01 ENCOUNTER — Inpatient Hospital Stay

## 2024-08-01 ENCOUNTER — Inpatient Hospital Stay: Attending: Physician Assistant | Admitting: Oncology

## 2024-08-01 ENCOUNTER — Encounter: Payer: Self-pay | Admitting: Oncology

## 2024-08-01 ENCOUNTER — Other Ambulatory Visit: Payer: Self-pay

## 2024-08-01 VITALS — BP 122/69 | HR 72 | Temp 97.7°F | Resp 18

## 2024-08-01 VITALS — BP 132/63 | HR 77 | Temp 97.7°F | Resp 17 | Ht 62.0 in | Wt 206.4 lb

## 2024-08-01 DIAGNOSIS — T451X5A Adverse effect of antineoplastic and immunosuppressive drugs, initial encounter: Secondary | ICD-10-CM | POA: Insufficient documentation

## 2024-08-01 DIAGNOSIS — C259 Malignant neoplasm of pancreas, unspecified: Secondary | ICD-10-CM | POA: Diagnosis not present

## 2024-08-01 DIAGNOSIS — Z7901 Long term (current) use of anticoagulants: Secondary | ICD-10-CM | POA: Diagnosis not present

## 2024-08-01 DIAGNOSIS — R5383 Other fatigue: Secondary | ICD-10-CM | POA: Insufficient documentation

## 2024-08-01 DIAGNOSIS — Z5111 Encounter for antineoplastic chemotherapy: Secondary | ICD-10-CM | POA: Diagnosis present

## 2024-08-01 DIAGNOSIS — Z86718 Personal history of other venous thrombosis and embolism: Secondary | ICD-10-CM | POA: Insufficient documentation

## 2024-08-01 DIAGNOSIS — E860 Dehydration: Secondary | ICD-10-CM | POA: Diagnosis present

## 2024-08-01 DIAGNOSIS — D6481 Anemia due to antineoplastic chemotherapy: Secondary | ICD-10-CM | POA: Diagnosis not present

## 2024-08-01 LAB — CMP (CANCER CENTER ONLY)
ALT: 44 U/L (ref 0–44)
AST: 36 U/L (ref 15–41)
Albumin: 3.9 g/dL (ref 3.5–5.0)
Alkaline Phosphatase: 102 U/L (ref 38–126)
Anion gap: 8 (ref 5–15)
BUN: 13 mg/dL (ref 8–23)
CO2: 22 mmol/L (ref 22–32)
Calcium: 9.4 mg/dL (ref 8.9–10.3)
Chloride: 111 mmol/L (ref 98–111)
Creatinine: 0.72 mg/dL (ref 0.44–1.00)
GFR, Estimated: >60 mL/min
Glucose, Bld: 98 mg/dL (ref 70–99)
Potassium: 3.9 mmol/L (ref 3.5–5.1)
Sodium: 141 mmol/L (ref 135–145)
Total Bilirubin: 0.3 mg/dL (ref 0.0–1.2)
Total Protein: 6.8 g/dL (ref 6.5–8.1)

## 2024-08-01 LAB — CBC WITH DIFFERENTIAL (CANCER CENTER ONLY)
Abs Immature Granulocytes: 0.01 K/uL (ref 0.00–0.07)
Basophils Absolute: 0 K/uL (ref 0.0–0.1)
Basophils Relative: 0 %
Eosinophils Absolute: 0.1 K/uL (ref 0.0–0.5)
Eosinophils Relative: 2 %
HCT: 29.7 % — ABNORMAL LOW (ref 36.0–46.0)
Hemoglobin: 8.9 g/dL — ABNORMAL LOW (ref 12.0–15.0)
Immature Granulocytes: 0 %
Lymphocytes Relative: 25 %
Lymphs Abs: 1.4 K/uL (ref 0.7–4.0)
MCH: 27 pg (ref 26.0–34.0)
MCHC: 30 g/dL (ref 30.0–36.0)
MCV: 90 fL (ref 80.0–100.0)
Monocytes Absolute: 0.7 K/uL (ref 0.1–1.0)
Monocytes Relative: 12 %
Neutro Abs: 3.4 K/uL (ref 1.7–7.7)
Neutrophils Relative %: 61 %
Platelet Count: 148 K/uL — ABNORMAL LOW (ref 150–400)
RBC: 3.3 MIL/uL — ABNORMAL LOW (ref 3.87–5.11)
RDW: 17.1 % — ABNORMAL HIGH (ref 11.5–15.5)
WBC Count: 5.6 K/uL (ref 4.0–10.5)
nRBC: 0 % (ref 0.0–0.2)

## 2024-08-01 MED ORDER — PACLITAXEL PROTEIN-BOUND CHEMO INJECTION 100 MG
123.0000 mg/m2 | Freq: Once | INTRAVENOUS | Status: AC
  Administered 2024-08-01: 250 mg via INTRAVENOUS
  Filled 2024-08-01: qty 50

## 2024-08-01 MED ORDER — SODIUM CHLORIDE 0.9 % IV SOLN
1000.0000 mg/m2 | Freq: Once | INTRAVENOUS | Status: AC
  Administered 2024-08-01: 2052 mg via INTRAVENOUS
  Filled 2024-08-01: qty 53.97

## 2024-08-01 MED ORDER — SODIUM CHLORIDE 0.9 % IV SOLN: INTRAVENOUS | Status: DC

## 2024-08-01 MED ORDER — PROCHLORPERAZINE MALEATE 10 MG PO TABS
10.0000 mg | ORAL_TABLET | Freq: Once | ORAL | Status: AC
  Administered 2024-08-01: 10 mg via ORAL
  Filled 2024-08-01: qty 1

## 2024-08-01 NOTE — Patient Instructions (Signed)
 [facility] - A DEPT OF [facility]   Discharge Instructions: Thank you for choosing [facility] to provide your oncology and hematology care.   If you have a lab appointment with the Cancer Center, please go directly to the Cancer Center and check in at the registration area.   Wear comfortable clothing and clothing appropriate for easy access to any Portacath or PICC line.   We strive to give you quality time with your provider. You may need to reschedule your appointment if you arrive late (15 or more minutes).  Arriving late affects you and other patients whose appointments are after yours.  Also, if you miss three or more appointments without notifying the office, you may be dismissed from the clinic at the provider's discretion.      For prescription refill requests, have your pharmacy contact our office and allow 72 hours for refills to be completed.    Today you received the following chemotherapy and/or immunotherapy agents: Paclitaxel protein-bound (Abraxane) and gemcitabine (Gemzar)      To help prevent nausea and vomiting after your treatment, we encourage you to take your nausea medication as directed.  BELOW ARE SYMPTOMS THAT SHOULD BE REPORTED IMMEDIATELY: *FEVER GREATER THAN 100.4 F (38 C) OR HIGHER *CHILLS OR SWEATING *NAUSEA AND VOMITING THAT IS NOT CONTROLLED WITH YOUR NAUSEA MEDICATION *UNUSUAL SHORTNESS OF BREATH *UNUSUAL BRUISING OR BLEEDING *URINARY PROBLEMS (pain or burning when urinating, or frequent urination) *BOWEL PROBLEMS (unusual diarrhea, constipation, pain near the anus) TENDERNESS IN MOUTH AND THROAT WITH OR WITHOUT PRESENCE OF ULCERS (sore throat, sores in mouth, or a toothache) UNUSUAL RASH, SWELLING OR PAIN  UNUSUAL VAGINAL DISCHARGE OR ITCHING   Items with * indicate a potential emergency and should be followed up as soon as possible or go to the Emergency Department if any problems should  occur.  Please show the CHEMOTHERAPY ALERT CARD or IMMUNOTHERAPY ALERT CARD at check-in to the Emergency Department and triage nurse.  Should you have questions after your visit or need to cancel or reschedule your appointment, please contact [facility] - A DEPT OF [facility]  Dept: [phone]  and follow the prompts.  Office hours are 8:00 a.m. to 4:30 p.m. Monday - Friday. Please note that voicemails left after 4:00 p.m. may not be returned until the following business day.  We are closed weekends and major holidays. You have access to a nurse at all times for urgent questions. Please call the main number to the clinic Dept: [phone] and follow the prompts.   For any non-urgent questions, you may also contact your provider using MyChart. We now offer e-Visits for anyone 24 and older to request care online for non-urgent symptoms. For details visit [email or web address].   Also download the MyChart app! Go to the app store, search MyChart, open the app, select [facility], and log in with your MyChart username and password.

## 2024-08-01 NOTE — Assessment & Plan Note (Addendum)
 Please review oncology history for additional details and timeline of events.  Her case was discussed in GI tumor conference on 03/19/2024. Uncertainty remains regarding the second spot in the gastrohepatic ligament, which may be post-biopsy inflammation rather than cancer.   Genetic testing came back negative for any deleterious germline mutations.  Patient had consultation with Dr. 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  The location of this does appear to be relatively central.  It is unclear to me whether this is proximal enough in the body that would require a Whipple or a distal pancreas.  It is not well-seen on imaging.  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 We will start with neoadjuvant chemotherapy.  I will then get repeat imaging.  I do want a pancreatic protocol follow-up scan.   Plan made for neoadjuvant chemotherapy with gemcitabine  and Abraxane , dose reduced for her age and comorbidities, followed by surgical evaluation.  Initially will plan for chemotherapy on days 1, 15 of 28-day cycle and if well-tolerated, we will attempt to do chemo on days 1, 8, 15 of 28-day cycle.  We started with 20% dose reduction of gemcitabine  and Abraxane .  Increased to standard dosing from cycle 2 onwards.  For the first 2 cycles, she received chemotherapy on days 1, 15 of 28-day cycle.  Since she was tolerating chemotherapy very well without major side effects, we attempted to increase the frequency to days 1, 8, 15 of 28-day cycle with cycle 3.  Following completion of cycle 3, she had significant fatigue, shortness of breath, decreased appetite, mouth sores and other side effects that led to ED visit.  She did not have her to be hospitalized.    Restaging CT pancreatic protocol on 06/23/2024 showed no significant change compared to prior.  No new disease.  Going forward, we will reduce the frequency of treatments to  days 1, 15 of 28-day cycle.  Will consider dose reduction of chemotherapy further depending on symptoms.  Currently due for cycle 4-day 15 of chemotherapy.  Labs reveal no dose-limiting toxicities.  Will proceed with chemotherapy at standard dosing.   Going forward we will give her standard dose chemotherapy on days 1, 15 of 28-day cycle.  RTC in 2 weeks for cycle 5-day 1 of chemotherapy.  Following completion of 6 cycles, restaging imaging will be obtained and we will consider radiation oncology referral based on findings for concurrent chemoradiation.

## 2024-08-01 NOTE — Progress Notes (Signed)
 "   [facility] CENTER  ONCOLOGY CLINIC PROGRESS NOTE   Patient Care Team: [name], MD as PCP - General (Family Medicine) Mansouraty, Aloha Raddle., MD as Consulting Physician (Gastroenterology)  PATIENT NAME: Gina Key   MR#: [id number] DOB: 1946-03-25  Date of visit: 08/01/2024   ASSESSMENT & PLAN:   Gina Key is a 79 y.o. lady with a past medical history of hypertension, history of pulmonary embolism in March 2025, seizure disorder, obstructive sleep apnea, arthritis, GERD, was referred to our clinic in August 2025 for pancreatic adenocarcinoma.   Pancreatic adenocarcinoma [facility]) Please review oncology history for additional details and timeline of events.  Her case was discussed in GI tumor conference on 03/19/2024. Uncertainty remains regarding the second spot in the gastrohepatic ligament, which may be post-biopsy inflammation rather than cancer.   Genetic testing came back negative for any deleterious germline mutations.  Patient had consultation with Dr. 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  The location of this does appear to be relatively central.  It is unclear to me whether this is proximal enough in the body that would require a Whipple or a distal pancreas.  It is not well-seen on imaging.  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 We will start with neoadjuvant chemotherapy.  I will then get repeat imaging.  I do want a pancreatic protocol follow-up scan.   Plan made for neoadjuvant chemotherapy with gemcitabine  and Abraxane , dose reduced for her age and comorbidities, followed by surgical evaluation.  Initially will plan for chemotherapy on days 1, 15 of 28-day cycle and if well-tolerated, we will attempt to do chemo on days 1, 8, 15 of 28-day cycle.  We started with 20% dose reduction of gemcitabine  and Abraxane .  Increased to standard dosing from cycle 2  onwards.  For the first 2 cycles, she received chemotherapy on days 1, 15 of 28-day cycle.  Since she was tolerating chemotherapy very well without major side effects, we attempted to increase the frequency to days 1, 8, 15 of 28-day cycle with cycle 3.  Following completion of cycle 3, she had significant fatigue, shortness of breath, decreased appetite, mouth sores and other side effects that led to ED visit.  She did not have her to be hospitalized.    Restaging CT pancreatic protocol on 06/23/2024 showed no significant change compared to prior.  No new disease.  Going forward, we will reduce the frequency of treatments to days 1, 15 of 28-day cycle.  Will consider dose reduction of chemotherapy further depending on symptoms.  Currently due for cycle 4-day 15 of chemotherapy.  Labs reveal no dose-limiting toxicities.  Will proceed with chemotherapy at standard dosing.   Going forward we will give her standard dose chemotherapy on days 1, 15 of 28-day cycle.  RTC in 2 weeks for cycle 5-day 1 of chemotherapy.  Following completion of 6 cycles, restaging imaging will be obtained and we will consider radiation oncology referral based on findings for concurrent chemoradiation.  Chemotherapy-induced anemia She has persistent, asymptomatic anemia secondary to chemotherapy, with hemoglobin levels consistently 8.8-9.1 g/dL. - Monitored hemoglobin and other blood counts with routine laboratory studies. - Continued current chemotherapy regimen as tolerated.  Generalized weakness She experiences lower extremity weakness, particularly with standing and ambulation, likely multifactorial from chemotherapy and fatigue. She ambulates with a walker and cane. Physical therapy was previously paused after bronchitis and  she is not ready to resume. - Encouraged resumption of physical therapy when she feels ready.  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 Blood counts are well-managed. - Continue Eliquis  for anticoagulation. - Monitor blood counts regularly during chemotherapy.   I reviewed lab results and outside records for this visit and discussed relevant results with the patient. Diagnosis, plan of care and treatment options were also discussed in detail with the patient. Opportunity provided to ask questions and answers provided to her apparent satisfaction. Provided instructions to call our clinic with any problems, questions or concerns prior to return visit. I recommended to continue follow-up with PCP and sub-specialists. She verbalized understanding and agreed with the plan.   NCCN guidelines have been consulted in the planning of this patients care.  I spent a total of 42 minutes during this encounter with the patient including review of chart and various tests results, discussions about plan of care and coordination of care plan.   [name], MD  08/01/2024 5:18 PM  [facility] - A DEPT OF [facility] [address] [place] [address] Dept: [phone] Dept Fax: [phone]    CHIEF COMPLAINT/ REASON FOR VISIT:   Adenocarcinoma of pancreas, Stage I vs stage II  Current Treatment: Plan for neoadjuvant chemotherapy with dose reduce gemcitabine  and Abraxane , followed by surgical evaluation.  Started systemic treatment with gemcitabine  and Abraxane  from 04/11/2024.  INTERVAL HISTORY:    Discussed the use of AI scribe software for clinical note transcription with the patient, who gave verbal consent to proceed.  History of Present Illness  Gina Key is a 79 year old female with advanced pancreatic cancer who presents for routine oncology follow-up to assess chemotherapy tolerance and treatment-related symptoms.  She is receiving full-strength biweekly chemotherapy after previously attempting weekly dosing, which was not tolerated.  She has tolerated the current regimen well over the past two weeks without significant adverse effects. Laboratory studies reveal stable, mildly decreased hemoglobin (8.8-9.1 g/dL), with normal white blood cell and platelet counts. Liver and kidney function tests are within normal limits.  She reports persistent lower extremity weakness, particularly with standing and ambulation, necessitating the use of a walker and cane. There is no associated paresthesia. Physical therapy was previously paused following an episode of bronchitis, which also resulted in a temporary interruption of chemotherapy. She has not resumed physical therapy and is not ready to do so at this time. She also endorses generalized fatigue consistent with ongoing chemotherapy.  She denies pain, stating that pain is well controlled. She also denies nausea or gastrointestinal symptoms. Appetite is good, and she is eating better without the need for appetite stimulants. She is not experiencing any new or worsening symptoms.   I have reviewed the past medical history, past surgical history, social history and family history with the patient and they are unchanged from previous note.  HISTORY OF PRESENT ILLNESS:   ONCOLOGY HISTORY:   On review of the previous records patient was evaluated in the emergency department on 02/05/2024 for gross hematuria.  CT abdomen pelvis was performed which showed moderate dilation of the pancreatic duct in the mid body and tail overlying and parenchymal atrophy. MR abdomen was ordered by PCP Dr. Mercer and performed on 02/07/24. MRI showed a focal segment of pancreatic ductal dilatation with abrupt caliber change and stricture. Subtle mass lesion in this location identified measuring 17 x 15 mm  at the level of the caliber change and has appearance worrisome for neoplasm such as an adenocarcinoma. Image did not show any other areas concerning for malignant disease.    CMP collected 02/05/24 in the ED was  overall unremarkable. Further chart review shows patient was diagnosed with a submassive PE with right heart strain 10/05/23 and is currently taking Eliquis .     She is retired, having worked in associate professor. She does not smoke or drink alcohol. She currently lives with her daughter due to past seizures. Her last colonoscopy in February 2024 was normal, as was her last mammogram. Her family history is significant for her mother having pancreatic cancer at age 41, her brother having throat cancer, and her daughter having kidney cancer.   She was seen in our rapid diagnostic clinic on 02/12/2024 for consultation and was referred to GI for EUS/biopsy.  Labs on that day showed a CA 19-9 of 8, CEA undetectable, chromogranin A increased at 224.8 ng/mL.  CBCD and CMP were unremarkable with normal LFTs.   On 03/03/2024 Dr. Wilhelmenia performed EGD and EUS.  On EUS, mass was identified in the pancreatic body.  Endosonographic appearance was highly suspicious for adenocarcinoma.  Staged as T1c, N0, MX.  FNA performed.  There was no sign of significant pathology in the CBD and in the common hepatic duct.  No malignant appearing lymph nodes were visualized in the celiac region, peripancreatic region and porta hepatis.  A cyst was found in the visualized portion of the liver measured 26 mm x 23 mm.  No evidence of any solid mass lesions in the left lobe of liver.   FNA from pancreatic mass came back positive for adenocarcinoma.   On 03/11/2024, staging PET scan showed ill-defined heterogeneous low-level hypermetabolic in the mid pancreas, corresponding to the abnormality on previous MRI.  Features compatible with biopsy-proven pancreatic adenocarcinoma.  16 x 11 mm focus of amorphous soft tissue in the gastrohepatic ligament was hypermetabolic and concerning for metastatic disease.  No evidence of hypermetabolic liver metastasis.  No evidence of metastatic disease elsewhere.   Her case was discussed in GI tumor  conference on 03/19/2024. Uncertainty remains regarding the second spot in the gastrohepatic ligament, which may be post-biopsy inflammation rather than cancer.   Genetic testing came back negative for any deleterious germline mutations.  Patient had consultation with Dr. 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  The location of this does appear to be relatively central.  It is unclear to me whether this is proximal enough in the body that would require a Whipple or a distal pancreas.  It is not well-seen on imaging.  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 We will start with neoadjuvant chemotherapy.  I will then get repeat imaging.  I do want a pancreatic protocol follow-up scan.   Plan for neoadjuvant chemotherapy with gemcitabine  and Abraxane , dose reduced for her age and comorbidities, followed by surgical evaluation.  Started systemic treatment with gemcitabine  and Abraxane  from 04/11/2024.  For the first 2 cycles, she received chemotherapy on days 1, 15 of 28-day cycle.  Since she was tolerating chemotherapy very well without major side effects, we attempted to increase the frequency to days 1, 8, 15 of 28-day cycle with cycle 3.  Following completion of cycle 3, she had significant fatigue, shortness of breath, decreased appetite, mouth sores and other side effects that led to ED  visit.  She did not have her to be hospitalized.    Restaging CT pancreatic protocol on 06/23/2024 showed no significant change compared to prior.  No new disease.  Going forward, we will reduce the frequency of treatments to days 1, 15 of 28-day cycle.  Will consider dose reduction of chemotherapy further depending on symptoms.  Oncology History  Pancreatic adenocarcinoma (HCC)  03/03/2024 Initial Diagnosis   Pancreatic adenocarcinoma (HCC)   03/21/2024 Cancer Staging   Staging form: Exocrine Pancreas, AJCC 8th Edition -  Clinical: Stage IIB (cT1c, cN1, cM0) - Signed by Autumn Millman, MD on 04/11/2024   03/31/2024 Genetic Testing   Negative genetic testing. No pathogenic variants identified on the [facility] CancerNext-Expanded+RNA Panel. The report date is 03/31/2024.   The CancerNext-Expanded gene panel offered by [facility] and includes sequencing, rearrangement, and RNA analysis for the following 77 genes: AIP, ALK, APC, ATM, AXIN2, BAP1, BARD1, BMPR1A, BRCA1, BRCA2, BRIP1, CDC73, CDH1, CDK4, CDKN1B, CDKN2A, CEBPA, CHEK2, CTNNA1, DDX41, DICER1, ETV6, FH, FLCN, GATA2, LZTR1, MAX, MBD4, MEN1, MET, MLH1, MSH2, MSH3, MSH6, MUTYH, NF1, NF2, NTHL1, PALB2, PHOX2B, PMS2, POT1, PRKAR1A, PTCH1, PTEN, RAD51C, RAD51D, RB1, RET, RPS20, RUNX1, SDHA, SDHAF2, SDHB, SDHC, SDHD, SMAD4, SMARCA4, SMARCB1, SMARCE1, STK11, SUFU, TMEM127, TP53, TSC1, TSC2, VHL, and WT1 (sequencing and deletion/duplication); EGFR, HOXB13, KIT, MITF, PDGFRA, POLD1, and POLE (sequencing only); EPCAM and GREM1 (deletion/duplication only).    04/11/2024 -  Chemotherapy   Patient is on Treatment Plan : PANCREATIC Abraxane  D1,15 + Gemcitabine  D1,15 q28d         REVIEW OF SYSTEMS:   Review of Systems - Oncology  All other pertinent systems were reviewed with the patient and are negative.  ALLERGIES: She has no known allergies.  MEDICATIONS:  Current Outpatient Medications  Medication Sig Dispense Refill   atorvastatin  (LIPITOR) 40 MG tablet      benzonatate  (TESSALON ) 100 MG capsule Take 1 capsule (100 mg total) by mouth 2 (two) times daily as needed for cough. (Patient not taking: Reported on 07/11/2024) 20 capsule 0   ELIQUIS  5 MG TABS tablet TAKE 1 TABLET BY MOUTH TWICE A DAY 180 tablet 1   ferrous sulfate 325 (65 FE) MG tablet Take 325 mg by mouth at bedtime.     Fexofenadine HCl (ALLEGRA PO) Take by mouth.     Incontinence Supply Disposable (WINGS HL ADULT BRIEFS/XL) MISC As needed daily. 100 each 11   ipratropium (ATROVENT ) 0.06 % nasal spray  PLACE 2 SPRAYS INTO BOTH NOSTRILS 4 TIMES DAILY. 45 mL 1   levETIRAcetam  (KEPPRA ) 500 MG tablet Take 1 tablet (500 mg total) by mouth 2 (two) times daily. 180 tablet 3   lidocaine  (HM LIDOCAINE  PATCH) 4 % Place 1 patch onto the skin daily as needed.     lidocaine -prilocaine  (EMLA ) cream Apply to affected area once 30 g 3   lipase/protease/amylase (CREON ) 36000 UNITS CPEP capsule Take 2 capsules (72,000 Units total) by mouth 3 (three) times daily with meals. May also take 1 capsule (36,000 Units total) as needed (with snacks - up to 4 snacks daily). 300 capsule 11   losartan  (COZAAR ) 100 MG tablet TAKE 1 TABLET BY MOUTH EVERY DAY 90 tablet 1   metoprolol  succinate (TOPROL -XL) 25 MG 24 hr tablet TAKE 1 TABLET (25 MG TOTAL) BY MOUTH DAILY. 90 tablet 1   montelukast  (SINGULAIR ) 10 MG tablet TAKE 1 TABLET BY MOUTH EVERYDAY AT BEDTIME 90 tablet 1   omeprazole (PRILOSEC) 20 MG capsule TAKE 1 CAPSULE EVERY DAY  90 capsule 1   ondansetron  (ZOFRAN ) 8 MG tablet Take 1 tablet (8 mg total) by mouth every 8 (eight) hours as needed for nausea or vomiting. 30 tablet 1   oxyCODONE  (ROXICODONE ) 5 MG immediate release tablet Take 1-2 tablets (5-10 mg total) by mouth every 12 (twelve) hours as needed for severe pain (pain score 7-10) or moderate pain (pain score 4-6) (1 tablet for moderate pain, up to 2 tablets for severe). 90 tablet 0   potassium chloride  (KLOR-CON ) 10 MEQ tablet TAKE 1 TABLET BY MOUTH EVERY DAY 90 tablet 3   prochlorperazine  (COMPAZINE ) 10 MG tablet Take 1 tablet (10 mg total) by mouth every 6 (six) hours as needed for nausea or vomiting. 30 tablet 1   rosuvastatin  (CRESTOR ) 20 MG tablet Take 1 tablet (20 mg total) by mouth daily. 90 tablet 3   simethicone (MYLICON) 125 MG chewable tablet Chew 125 mg by mouth every 6 (six) hours as needed for flatulence.     spironolactone  (ALDACTONE ) 25 MG tablet TAKE 1 TABLET (25 MG TOTAL) BY MOUTH DAILY. 90 tablet 1   topiramate  (TOPAMAX ) 100 MG tablet TAKE 1 TABLET  BY MOUTH TWICE A DAY 180 tablet 1   Current Facility-Administered Medications  Medication Dose Route Frequency Provider Last Rate Last Admin   lidocaine  (XYLOCAINE ) 1 % (with pres) injection 1 mL  1 mL Other Once        lidocaine  (XYLOCAINE ) 1 % (with pres) injection 4 mL  4 mL Other Once        triamcinolone  acetonide (KENALOG -40) injection 40 mg  40 mg Intramuscular Once        triamcinolone  acetonide (KENALOG -40) injection 40 mg  40 mg Intra-articular Once          VITALS:   Blood pressure 132/63, pulse 77, temperature 97.7 F (36.5 C), temperature source Temporal, resp. rate 17, height 5' 2 (1.575 m), weight 206 lb 6.4 oz (93.6 kg), SpO2 100%.  Wt Readings from Last 3 Encounters:  08/01/24 206 lb 6.4 oz (93.6 kg)  07/18/24 206 lb (93.4 kg)  07/11/24 202 lb (91.6 kg)    Body mass index is 37.75 kg/m.    Onc Performance Status - 08/01/24 0800       ECOG Perf Status   ECOG Perf Status Capable of only limited selfcare, confined to bed or chair more than 50% of waking hours      KPS SCALE   KPS % SCORE Cares for self, unable to carry on normal activity or to do active work           PHYSICAL EXAM:   Physical Exam Constitutional:      General: She is not in acute distress.    Appearance: Normal appearance.  HENT:     Head: Normocephalic and atraumatic.  Cardiovascular:     Rate and Rhythm: Normal rate.  Pulmonary:     Effort: Pulmonary effort is normal. No respiratory distress.  Abdominal:     General: There is no distension.  Neurological:     General: No focal deficit present.     Mental Status: She is alert and oriented to person, place, and time.  Psychiatric:        Mood and Affect: Mood normal.        Behavior: Behavior normal.       LABORATORY DATA:   I have reviewed the data as listed.  Results for orders placed or performed in visit on 08/01/24  CMP (Cancer  Center only)  Result Value Ref Range   Sodium 141 135 - 145 mmol/L   Potassium 3.9  3.5 - 5.1 mmol/L   Chloride 111 98 - 111 mmol/L   CO2 22 22 - 32 mmol/L   Glucose, Bld 98 70 - 99 mg/dL   BUN 13 8 - 23 mg/dL   Creatinine 9.27 9.55 - 1.00 mg/dL   Calcium  9.4 8.9 - 10.3 mg/dL   Total Protein 6.8 6.5 - 8.1 g/dL   Albumin 3.9 3.5 - 5.0 g/dL   AST 36 15 - 41 U/L   ALT 44 0 - 44 U/L   Alkaline Phosphatase 102 38 - 126 U/L   Total Bilirubin 0.3 0.0 - 1.2 mg/dL   GFR, Estimated >39 >39 mL/min   Anion gap 8 5 - 15  CBC with Differential (Cancer Center Only)  Result Value Ref Range   WBC Count 5.6 4.0 - 10.5 K/uL   RBC 3.30 (L) 3.87 - 5.11 MIL/uL   Hemoglobin 8.9 (L) 12.0 - 15.0 g/dL   HCT 70.2 (L) 63.9 - 53.9 %   MCV 90.0 80.0 - 100.0 fL   MCH 27.0 26.0 - 34.0 pg   MCHC 30.0 30.0 - 36.0 g/dL   RDW 82.8 (H) 88.4 - 84.4 %   Platelet Count 148 (L) 150 - 400 K/uL   nRBC 0.0 0.0 - 0.2 %   Neutrophils Relative % 61 %   Neutro Abs 3.4 1.7 - 7.7 K/uL   Lymphocytes Relative 25 %   Lymphs Abs 1.4 0.7 - 4.0 K/uL   Monocytes Relative 12 %   Monocytes Absolute 0.7 0.1 - 1.0 K/uL   Eosinophils Relative 2 %   Eosinophils Absolute 0.1 0.0 - 0.5 K/uL   Basophils Relative 0 %   Basophils Absolute 0.0 0.0 - 0.1 K/uL   Immature Granulocytes 0 %   Abs Immature Granulocytes 0.01 0.00 - 0.07 K/uL       RADIOGRAPHIC STUDIES:  No results found.    CODE STATUS:  Code Status History     Date Active Date Inactive Code Status Order ID Comments User Context   10/05/2023 0213 10/06/2023 1942 Full Code [id number]  Lee Kingfisher, MD ED    Questions for Most Recent Historical Code Status (Order [id number])     Question Answer   By: Consent: discussion documented in EHR            Orders Placed This Encounter  Procedures   CBC with Differential (Cancer Center Only)    Standing Status:   Future    Expected Date:   09/19/2024    Expiration Date:   09/19/2025   CMP (Cancer Center only)    Standing Status:   Future    Expected Date:   09/19/2024    Expiration Date:    09/19/2025   CBC with Differential (Cancer Center Only)    Standing Status:   Future    Expected Date:   10/03/2024    Expiration Date:   10/03/2025   CMP (Cancer Center only)    Standing Status:   Future    Expected Date:   10/03/2024    Expiration Date:   10/03/2025     Future Appointments  Date Time Provider Department Center  08/15/2024  7:45 AM [facility] FLUSH [facility] None  08/15/2024  8:30 AM [facility] INFUSION [facility] None  08/15/2024  8:45 AM Daniyah Fohl, Chinita, MD [facility] None  08/25/2024  2:40 PM Urbano Albright, MD [facility] CPR  08/29/2024  7:45 AM [facility] FLUSH [facility] None  08/29/2024  8:30 AM [facility] INFUSION [facility] None  08/29/2024  8:45 AM Wyona Neils, MD [facility] None  02/25/2025  1:40 PM [facility]NUAL WELLNESS VISIT [facility] [address]  06/10/2025  7:45 AM Gayland Lauraine PARAS, NP [facility] None      This document was completed utilizing speech recognition software. Grammatical errors, random word insertions, pronoun errors, and incomplete sentences are an occasional consequence of this system due to software limitations, ambient noise, and hardware issues. Any formal questions or concerns about the content, text or information contained within the body of this dictation should be directly addressed to the provider for clarification.   "

## 2024-08-07 ENCOUNTER — Telehealth: Payer: Self-pay | Admitting: Gastroenterology

## 2024-08-07 NOTE — Telephone Encounter (Signed)
 Good afternoon Dr. Wilhelmenia  PT called to schedule a colonoscopy. Wanted to make us  aware that she is undergoing treatment for pancreatic cancer and if she should continue scheudling the procedure. Please review and advise.

## 2024-08-14 NOTE — Telephone Encounter (Signed)
 Review of chart suggest that she is not even due for colonoscopy as she just had 1 in 2024. If she is having other symptoms that may require an endoscopic evaluation, then it does not matter if she has pancreas cancer or not but if she is not having any other symptoms and this was just for screening purposes or surveillance, it is not needed at this time. Please update me if there are symptoms she is experiencing. Thanks. GM

## 2024-08-15 ENCOUNTER — Inpatient Hospital Stay

## 2024-08-15 ENCOUNTER — Inpatient Hospital Stay: Admitting: Oncology

## 2024-08-15 VITALS — BP 137/67 | HR 67 | Temp 97.7°F | Resp 16 | Wt 203.0 lb

## 2024-08-15 DIAGNOSIS — C259 Malignant neoplasm of pancreas, unspecified: Secondary | ICD-10-CM | POA: Diagnosis not present

## 2024-08-15 DIAGNOSIS — Z5111 Encounter for antineoplastic chemotherapy: Secondary | ICD-10-CM | POA: Diagnosis not present

## 2024-08-15 LAB — CMP (CANCER CENTER ONLY)
ALT: 34 U/L (ref 0–44)
AST: 29 U/L (ref 15–41)
Albumin: 3.9 g/dL (ref 3.5–5.0)
Alkaline Phosphatase: 117 U/L (ref 38–126)
Anion gap: 10 (ref 5–15)
BUN: 8 mg/dL (ref 8–23)
CO2: 21 mmol/L — ABNORMAL LOW (ref 22–32)
Calcium: 9.2 mg/dL (ref 8.9–10.3)
Chloride: 112 mmol/L — ABNORMAL HIGH (ref 98–111)
Creatinine: 0.79 mg/dL (ref 0.44–1.00)
GFR, Estimated: >60 mL/min
Glucose, Bld: 129 mg/dL — ABNORMAL HIGH (ref 70–99)
Potassium: 3.7 mmol/L (ref 3.5–5.1)
Sodium: 143 mmol/L (ref 135–145)
Total Bilirubin: 0.3 mg/dL (ref 0.0–1.2)
Total Protein: 6.8 g/dL (ref 6.5–8.1)

## 2024-08-15 LAB — CBC WITH DIFFERENTIAL (CANCER CENTER ONLY)
Abs Immature Granulocytes: 0.01 K/uL (ref 0.00–0.07)
Basophils Absolute: 0 K/uL (ref 0.0–0.1)
Basophils Relative: 1 %
Eosinophils Absolute: 0.1 K/uL (ref 0.0–0.5)
Eosinophils Relative: 1 %
HCT: 30.5 % — ABNORMAL LOW (ref 36.0–46.0)
Hemoglobin: 9.2 g/dL — ABNORMAL LOW (ref 12.0–15.0)
Immature Granulocytes: 0 %
Lymphocytes Relative: 32 %
Lymphs Abs: 1.3 K/uL (ref 0.7–4.0)
MCH: 27.1 pg (ref 26.0–34.0)
MCHC: 30.2 g/dL (ref 30.0–36.0)
MCV: 90 fL (ref 80.0–100.0)
Monocytes Absolute: 0.6 K/uL (ref 0.1–1.0)
Monocytes Relative: 14 %
Neutro Abs: 2.1 K/uL (ref 1.7–7.7)
Neutrophils Relative %: 52 %
Platelet Count: 197 K/uL (ref 150–400)
RBC: 3.39 MIL/uL — ABNORMAL LOW (ref 3.87–5.11)
RDW: 16.3 % — ABNORMAL HIGH (ref 11.5–15.5)
WBC Count: 4.1 K/uL (ref 4.0–10.5)
nRBC: 0 % (ref 0.0–0.2)

## 2024-08-15 MED ORDER — PROCHLORPERAZINE MALEATE 10 MG PO TABS
10.0000 mg | ORAL_TABLET | Freq: Once | ORAL | Status: AC
  Administered 2024-08-15: 10 mg via ORAL
  Filled 2024-08-15: qty 1

## 2024-08-15 MED ORDER — SODIUM CHLORIDE 0.9% FLUSH: 10.0000 mL | INTRAVENOUS | Status: DC | PRN

## 2024-08-15 MED ORDER — PACLITAXEL PROTEIN-BOUND CHEMO INJECTION 100 MG
123.0000 mg/m2 | Freq: Once | INTRAVENOUS | Status: AC
  Administered 2024-08-15: 250 mg via INTRAVENOUS
  Filled 2024-08-15: qty 50

## 2024-08-15 MED ORDER — SODIUM CHLORIDE 0.9 % IV SOLN
1000.0000 mg/m2 | Freq: Once | INTRAVENOUS | Status: AC
  Administered 2024-08-15: 2052 mg via INTRAVENOUS
  Filled 2024-08-15: qty 53.97

## 2024-08-15 MED ORDER — SODIUM CHLORIDE 0.9 % IV SOLN: INTRAVENOUS | Status: DC

## 2024-08-15 NOTE — Patient Instructions (Signed)
 [facility] - A DEPT OF [facility] [place] [place]  Discharge Instructions: Thank you for choosing [facility] to provide your oncology and hematology care.   If you have a lab appointment with the Cancer Center, please go directly to the Cancer Center and check in at the registration area.   Wear comfortable clothing and clothing appropriate for easy access to any Portacath or PICC line.   We strive to give you quality time with your provider. You may need to reschedule your appointment if you arrive late (15 or more minutes).  Arriving late affects you and other patients whose appointments are after yours.  Also, if you miss three or more appointments without notifying the office, you may be dismissed from the clinic at the provider's discretion.      For prescription refill requests, have your pharmacy contact our office and allow 72 hours for refills to be completed.    Today you received the following chemotherapy and/or immunotherapy agents abraxane and gemzar       To help prevent nausea and vomiting after your treatment, we encourage you to take your nausea medication as directed.  BELOW ARE SYMPTOMS THAT SHOULD BE REPORTED IMMEDIATELY: *FEVER GREATER THAN 100.4 F (38 C) OR HIGHER *CHILLS OR SWEATING *NAUSEA AND VOMITING THAT IS NOT CONTROLLED WITH YOUR NAUSEA MEDICATION *UNUSUAL SHORTNESS OF BREATH *UNUSUAL BRUISING OR BLEEDING *URINARY PROBLEMS (pain or burning when urinating, or frequent urination) *BOWEL PROBLEMS (unusual diarrhea, constipation, pain near the anus) TENDERNESS IN MOUTH AND THROAT WITH OR WITHOUT PRESENCE OF ULCERS (sore throat, sores in mouth, or a toothache) UNUSUAL RASH, SWELLING OR PAIN  UNUSUAL VAGINAL DISCHARGE OR ITCHING   Items with * indicate a potential emergency and should be followed up as soon as possible or go to the Emergency Department if any problems should occur.  Please show the CHEMOTHERAPY ALERT CARD or  IMMUNOTHERAPY ALERT CARD at check-in to the Emergency Department and triage nurse.  Should you have questions after your visit or need to cancel or reschedule your appointment, please contact [facility] - [facility] DE[facility]  Dept: [phone]  and follow the prompts.  Office hours are 8:00 a.m. to 4:30 p.m. Monday - Friday. Please note that voicemails left after 4:00 p.m. may not be returned until the following business day.  We are closed weekends and major holidays. You have access to a nurse at all times for urgent questions. Please call the main number to the clinic Dept: [phone] and follow the prompts.   For any non-urgent questions, you may also contact your provider using MyChart. We now offer e-Visits for anyone 58 and older to request care online for non-urgent symptoms. For details visit [email or web address].   Also download the MyChart app! Go to the app store, search "MyChart", open the app, select [place], and log in with your MyChart username and password.

## 2024-08-15 NOTE — Progress Notes (Signed)
 "  [facility]  ONCOLOGY CLINIC PROGRESS NOTE   Patient Care Team: [name], MD as PCP - General (Family Medicine) Mansouraty, Aloha Raddle., MD as Consulting Physician (Gastroenterology)  PATIENT NAME: Gina Key   MR#: [id number] DOB: [birth year]  Date of visit: 08/15/2024   ASSESSMENT & PLAN:   Kimbree Casanas is a 79 y.o. lady with a past medical history of hypertension, history of pulmonary embolism in March 2025, seizure disorder, obstructive sleep apnea, arthritis, GERD, was referred to our clinic in August 2025 for pancreatic adenocarcinoma.   Pancreatic adenocarcinoma [facility]) Please review oncology history for additional details and timeline of events.  Her case was discussed in GI tumor conference on 03/19/2024. Uncertainty remains regarding the second spot in the gastrohepatic ligament, which may be post-biopsy inflammation rather than cancer.   Genetic testing came back negative for any deleterious germline mutations.  Patient had consultation with Dr. 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  The location of this does appear to be relatively central.  It is unclear to me whether this is proximal enough in the body that would require a Whipple or a distal pancreas.  It is not well-seen on imaging.  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 We will start with neoadjuvant chemotherapy.  I will then get repeat imaging.  I do want a pancreatic protocol follow-up scan.   Plan made for neoadjuvant chemotherapy with gemcitabine  and Abraxane , dose reduced for her age and comorbidities, followed by surgical evaluation.  Initially will plan for chemotherapy on days 1, 15 of 28-day cycle and if well-tolerated, we will attempt to do chemo on days 1, 8, 15 of 28-day cycle.  We started with 20% dose reduction of gemcitabine  and Abraxane .  Increased to standard dosing from cycle 2  onwards.  For the first 2 cycles, she received chemotherapy on days 1, 15 of 28-day cycle.  Since she was tolerating chemotherapy very well without major side effects, we attempted to increase the frequency to days 1, 8, 15 of 28-day cycle with cycle 3.  Following completion of cycle 3, she had significant fatigue, shortness of breath, decreased appetite, mouth sores and other side effects that led to ED visit.  She did not have to be hospitalized.    Restaging CT pancreatic protocol on 06/23/2024 showed no significant change compared to prior.  No new disease.  Starting from cycle 4 onwards, we reduced the frequency of treatments to days 1, 15 of 28-day cycle.  Will consider dose reduction of chemotherapy further depending on symptoms.  Currently due for cycle 5-day 1 of chemotherapy.  Labs reveal no dose-limiting toxicities.  Will proceed with chemotherapy at standard dosing.   RTC in 2 weeks for cycle 5-day 15 of chemotherapy.  Following completion of 6 cycles, restaging imaging will be obtained and we will consider radiation oncology referral based on findings for concurrent chemoradiation.  Chemotherapy-induced peripheral neuropathy She reports intermittent numbness and soreness on the plantar surfaces of her feet, consistent with chemotherapy-induced peripheral neuropathy. Symptoms are mild, transient, and do not interfere with ambulation or daily activities. No new medication was initiated due to mild symptoms and her current medication burden. Treatment options, including duloxetine  and gabapentin, were reviewed. She prefers to defer pharmacologic intervention at this time. - Reviewed neuropathy as a chemotherapy side effect and discussed medication options (duloxetine , gabapentin). - Deferred initiation of neuropathy  medication per her preference and mild symptomatology.  Chemotherapy-induced anemia She has persistent, asymptomatic anemia secondary to chemotherapy, with hemoglobin levels  consistently 8.8-9.1 g/dL. - Monitored hemoglobin and other blood counts with routine laboratory studies. - Continued current chemotherapy regimen as tolerated.  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 Blood counts are well-managed. - Continue Eliquis  for anticoagulation. - Monitor blood counts regularly during chemotherapy.   I reviewed lab results and outside records for this visit and discussed relevant results with the patient. Diagnosis, plan of care and treatment options were also discussed in detail with the patient. Opportunity provided to ask questions and answers provided to her apparent satisfaction. Provided instructions to call our clinic with any problems, questions or concerns prior to return visit. I recommended to continue follow-up with PCP and sub-specialists. She verbalized understanding and agreed with the plan.   NCCN guidelines have been consulted in the planning of this patients care.  I spent a total of 42 minutes during this encounter with the patient including review of chart and various tests results, discussions about plan of care and coordination of care plan.   [name], MD  08/15/2024 8:43 AM  [facility] - A DEPT OF [facility] [address] [place] [address] Dept: [phone] Dept Fax: [phone]    CHIEF COMPLAINT/ REASON FOR VISIT:   Adenocarcinoma of pancreas, Stage I vs stage II  Current Treatment: Plan for neoadjuvant chemotherapy with dose reduce gemcitabine  and Abraxane , followed by surgical evaluation.  Started systemic treatment with gemcitabine  and Abraxane  from 04/11/2024.  INTERVAL HISTORY:    Discussed the use of AI scribe software for clinical note transcription with the patient, who gave verbal consent to proceed.  History of Present Illness  Gina Key is a 79 year old female with  chemotherapy-induced peripheral neuropathy who presents for routine follow-up during antineoplastic chemotherapy.  She is currently undergoing chemotherapy, with today marking the initiation of cycle 5 of a planned minimum of six cycles, administered every two weeks.  She describes intermittent numbness and soreness on the plantar surfaces of her feet, consistent with chemotherapy-induced peripheral neuropathy. Symptoms are episodic, sometimes persisting for extended periods before resolving. Topical cream provides symptomatic relief.  She denies nausea, vomiting, or pain. Appetite is preserved, oral intake is adequate, and bowel movements are regular.   I have reviewed the past medical history, past surgical history, social history and family history with the patient and they are unchanged from previous note.  HISTORY OF PRESENT ILLNESS:   ONCOLOGY HISTORY:   On review of the previous records patient was evaluated in the emergency department on 02/05/2024 for gross hematuria.  CT abdomen pelvis was performed which showed moderate dilation of the pancreatic duct in the mid body and tail overlying and parenchymal atrophy. MR abdomen was ordered by PCP Dr. Mercer and performed on 02/07/24. MRI showed a focal segment of pancreatic ductal dilatation with abrupt caliber change and stricture. Subtle mass lesion in this location identified measuring 17 x 15 mm at the level of the caliber change and has appearance worrisome for neoplasm such as an adenocarcinoma. Image did not show any other areas concerning for malignant disease.    CMP collected 02/05/24 in the ED was overall unremarkable. Further chart review shows patient was diagnosed with a submassive PE with right heart strain 10/05/23 and is currently taking Eliquis .     She is retired,  having worked in associate professor. She does not smoke or drink alcohol. She currently lives with her daughter due to past seizures. Her last colonoscopy in  February 2024 was normal, as was her last mammogram. Her family history is significant for her mother having pancreatic cancer at age 30, her brother having throat cancer, and her daughter having kidney cancer.   She was seen in our rapid diagnostic clinic on 02/12/2024 for consultation and was referred to GI for EUS/biopsy.  Labs on that day showed a CA 19-9 of 8, CEA undetectable, chromogranin A increased at 224.8 ng/mL.  CBCD and CMP were unremarkable with normal LFTs.   On 03/03/2024 Dr. Wilhelmenia performed EGD and EUS.  On EUS, mass was identified in the pancreatic body.  Endosonographic appearance was highly suspicious for adenocarcinoma.  Staged as T1c, N0, MX.  FNA performed.  There was no sign of significant pathology in the CBD and in the common hepatic duct.  No malignant appearing lymph nodes were visualized in the celiac region, peripancreatic region and porta hepatis.  A cyst was found in the visualized portion of the liver measured 26 mm x 23 mm.  No evidence of any solid mass lesions in the left lobe of liver.   FNA from pancreatic mass came back positive for adenocarcinoma.   On 03/11/2024, staging PET scan showed ill-defined heterogeneous low-level hypermetabolic in the mid pancreas, corresponding to the abnormality on previous MRI.  Features compatible with biopsy-proven pancreatic adenocarcinoma.  16 x 11 mm focus of amorphous soft tissue in the gastrohepatic ligament was hypermetabolic and concerning for metastatic disease.  No evidence of hypermetabolic liver metastasis.  No evidence of metastatic disease elsewhere.   Her case was discussed in GI tumor conference on 03/19/2024. Uncertainty remains regarding the second spot in the gastrohepatic ligament, which may be post-biopsy inflammation rather than cancer.   Genetic testing came back negative for any deleterious germline mutations.  Patient had consultation with Dr. Aron on 03/20/2024:  Clinical T1 N1 adenocarcinoma of the  pancreas.  The location of this does appear to be relatively central.  It is unclear to me whether this is proximal enough in the body that would require a Whipple or a distal pancreas.  It is not well-seen on imaging.  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 We will start with neoadjuvant chemotherapy.  I will then get repeat imaging.  I do want a pancreatic protocol follow-up scan.   Plan for neoadjuvant chemotherapy with gemcitabine  and Abraxane , dose reduced for her age and comorbidities, followed by surgical evaluation.  Started systemic treatment with gemcitabine  and Abraxane  from 04/11/2024.  For the first 2 cycles, she received chemotherapy on days 1, 15 of 28-day cycle.  Since she was tolerating chemotherapy very well without major side effects, we attempted to increase the frequency to days 1, 8, 15 of 28-day cycle with cycle 3.  Following completion of cycle 3, she had significant fatigue, shortness of breath, decreased appetite, mouth sores and other side effects that led to ED visit.  She did not have her to be hospitalized.    Restaging CT pancreatic protocol on 06/23/2024 showed no significant change compared to prior.  No new disease.  Starting from cycle 4 onwards, we reduced the frequency of treatments to days 1, 15 of 28-day cycle.  Will consider dose reduction of chemotherapy further depending on symptoms.  Oncology History  Pancreatic adenocarcinoma (HCC)  03/03/2024 Initial Diagnosis   Pancreatic adenocarcinoma (HCC)   03/21/2024 Cancer Staging   Staging form: Exocrine Pancreas, AJCC 8th Edition - Clinical: Stage IIB (cT1c, cN1, cM0) - Signed by Autumn Millman, MD on 04/11/2024   03/31/2024 Genetic Testing   Negative genetic testing. No pathogenic variants identified on the [facility] CancerNext-Expanded+RNA Panel. The report date is 03/31/2024.   The CancerNext-Expanded gene panel offered by [facility] and includes  sequencing, rearrangement, and RNA analysis for the following 77 genes: AIP, ALK, APC, ATM, AXIN2, BAP1, BARD1, BMPR1A, BRCA1, BRCA2, BRIP1, CDC73, CDH1, CDK4, CDKN1B, CDKN2A, CEBPA, CHEK2, CTNNA1, DDX41, DICER1, ETV6, FH, FLCN, GATA2, LZTR1, MAX, MBD4, MEN1, MET, MLH1, MSH2, MSH3, MSH6, MUTYH, NF1, NF2, NTHL1, PALB2, PHOX2B, PMS2, POT1, PRKAR1A, PTCH1, PTEN, RAD51C, RAD51D, RB1, RET, RPS20, RUNX1, SDHA, SDHAF2, SDHB, SDHC, SDHD, SMAD4, SMARCA4, SMARCB1, SMARCE1, STK11, SUFU, TMEM127, TP53, TSC1, TSC2, VHL, and WT1 (sequencing and deletion/duplication); EGFR, HOXB13, KIT, MITF, PDGFRA, POLD1, and POLE (sequencing only); EPCAM and GREM1 (deletion/duplication only).    04/11/2024 -  Chemotherapy   Patient is on Treatment Plan : PANCREATIC Abraxane  D1,15 + Gemcitabine  D1,15 q28d         REVIEW OF SYSTEMS:   Review of Systems - Oncology  All other pertinent systems were reviewed with the patient and are negative.  ALLERGIES: She has no known allergies.  MEDICATIONS:  Current Outpatient Medications  Medication Sig Dispense Refill   atorvastatin  (LIPITOR) 40 MG tablet      benzonatate  (TESSALON ) 100 MG capsule Take 1 capsule (100 mg total) by mouth 2 (two) times daily as needed for cough. (Patient not taking: Reported on 07/11/2024) 20 capsule 0   ELIQUIS  5 MG TABS tablet TAKE 1 TABLET BY MOUTH TWICE A DAY 180 tablet 1   ferrous sulfate 325 (65 FE) MG tablet Take 325 mg by mouth at bedtime.     Fexofenadine HCl (ALLEGRA PO) Take by mouth.     Incontinence Supply Disposable (WINGS HL ADULT BRIEFS/XL) MISC As needed daily. 100 each 11   ipratropium (ATROVENT ) 0.06 % nasal spray PLACE 2 SPRAYS INTO BOTH NOSTRILS 4 TIMES DAILY. 45 mL 1   levETIRAcetam  (KEPPRA ) 500 MG tablet Take 1 tablet (500 mg total) by mouth 2 (two) times daily. 180 tablet 3   lidocaine  (HM LIDOCAINE  PATCH) 4 % Place 1 patch onto the skin daily as needed.     lidocaine -prilocaine  (EMLA ) cream Apply to affected area once 30 g 3    lipase/protease/amylase (CREON ) 36000 UNITS CPEP capsule Take 2 capsules (72,000 Units total) by mouth 3 (three) times daily with meals. May also take 1 capsule (36,000 Units total) as needed (with snacks - up to 4 snacks daily). 300 capsule 11   losartan  (COZAAR ) 100 MG tablet TAKE 1 TABLET BY MOUTH EVERY DAY 90 tablet 1   metoprolol  succinate (TOPROL -XL) 25 MG 24 hr tablet TAKE 1 TABLET (25 MG TOTAL) BY MOUTH DAILY. 90 tablet 1   montelukast  (SINGULAIR ) 10 MG tablet TAKE 1 TABLET BY MOUTH EVERYDAY AT BEDTIME 90 tablet 1   omeprazole (PRILOSEC) 20 MG capsule TAKE 1 CAPSULE EVERY DAY 90 capsule 1   ondansetron  (ZOFRAN ) 8 MG tablet Take 1 tablet (8 mg total) by mouth every 8 (eight) hours as needed for nausea or vomiting. 30 tablet 1   oxyCODONE  (ROXICODONE ) 5 MG immediate release tablet Take 1-2 tablets (5-10 mg total) by mouth every 12 (twelve) hours as needed for severe pain (pain score 7-10) or moderate pain (pain score 4-6) (  1 tablet for moderate pain, up to 2 tablets for severe). 90 tablet 0   potassium chloride  (KLOR-CON ) 10 MEQ tablet TAKE 1 TABLET BY MOUTH EVERY DAY 90 tablet 3   prochlorperazine  (COMPAZINE ) 10 MG tablet Take 1 tablet (10 mg total) by mouth every 6 (six) hours as needed for nausea or vomiting. 30 tablet 1   rosuvastatin  (CRESTOR ) 20 MG tablet Take 1 tablet (20 mg total) by mouth daily. 90 tablet 3   simethicone (MYLICON) 125 MG chewable tablet Chew 125 mg by mouth every 6 (six) hours as needed for flatulence.     spironolactone  (ALDACTONE ) 25 MG tablet TAKE 1 TABLET (25 MG TOTAL) BY MOUTH DAILY. 90 tablet 1   topiramate  (TOPAMAX ) 100 MG tablet TAKE 1 TABLET BY MOUTH TWICE A DAY 180 tablet 1   Current Facility-Administered Medications  Medication Dose Route Frequency Provider Last Rate Last Admin   lidocaine  (XYLOCAINE ) 1 % (with pres) injection 1 mL  1 mL Other Once        lidocaine  (XYLOCAINE ) 1 % (with pres) injection 4 mL  4 mL Other Once        triamcinolone  acetonide  (KENALOG -40) injection 40 mg  40 mg Intramuscular Once        triamcinolone  acetonide (KENALOG -40) injection 40 mg  40 mg Intra-articular Once          VITALS:   There were no vitals taken for this visit.  Wt Readings from Last 3 Encounters:  08/15/24 203 lb (92.1 kg)  08/01/24 206 lb 6.4 oz (93.6 kg)  07/18/24 206 lb (93.4 kg)    There is no height or weight on file to calculate BMI.    Onc Performance Status - 08/16/24 1700       ECOG Perf Status   ECOG Perf Status Ambulatory and capable of all selfcare but unable to carry out any work activities.  Up and about more than 50% of waking hours      KPS SCALE   KPS % SCORE Cares for self, unable to carry on normal activity or to do active work            PHYSICAL EXAM:   Physical Exam Constitutional:      General: She is not in acute distress.    Appearance: Normal appearance.  HENT:     Head: Normocephalic and atraumatic.  Cardiovascular:     Rate and Rhythm: Normal rate.  Pulmonary:     Effort: Pulmonary effort is normal. No respiratory distress.  Abdominal:     General: There is no distension.  Neurological:     General: No focal deficit present.     Mental Status: She is alert and oriented to person, place, and time.  Psychiatric:        Mood and Affect: Mood normal.        Behavior: Behavior normal.       LABORATORY DATA:   I have reviewed the data as listed.  Results for orders placed or performed in visit on 08/15/24  CBC with Differential (Cancer Center Only)  Result Value Ref Range   WBC Count 4.1 4.0 - 10.5 K/uL   RBC 3.39 (L) 3.87 - 5.11 MIL/uL   Hemoglobin 9.2 (L) 12.0 - 15.0 g/dL   HCT 69.4 (L) 63.9 - 53.9 %   MCV 90.0 80.0 - 100.0 fL   MCH 27.1 26.0 - 34.0 pg   MCHC 30.2 30.0 - 36.0 g/dL   RDW 83.6 (H)  11.5 - 15.5 %   Platelet Count 197 150 - 400 K/uL   nRBC 0.0 0.0 - 0.2 %   Neutrophils Relative % 52 %   Neutro Abs 2.1 1.7 - 7.7 K/uL   Lymphocytes Relative 32 %   Lymphs Abs 1.3  0.7 - 4.0 K/uL   Monocytes Relative 14 %   Monocytes Absolute 0.6 0.1 - 1.0 K/uL   Eosinophils Relative 1 %   Eosinophils Absolute 0.1 0.0 - 0.5 K/uL   Basophils Relative 1 %   Basophils Absolute 0.0 0.0 - 0.1 K/uL   Immature Granulocytes 0 %   Abs Immature Granulocytes 0.01 0.00 - 0.07 K/uL  CMP (Cancer Center only)  Result Value Ref Range   Sodium 143 135 - 145 mmol/L   Potassium 3.7 3.5 - 5.1 mmol/L   Chloride 112 (H) 98 - 111 mmol/L   CO2 21 (L) 22 - 32 mmol/L   Glucose, Bld 129 (H) 70 - 99 mg/dL   BUN 8 8 - 23 mg/dL   Creatinine 9.20 9.55 - 1.00 mg/dL   Calcium  9.2 8.9 - 10.3 mg/dL   Total Protein 6.8 6.5 - 8.1 g/dL   Albumin 3.9 3.5 - 5.0 g/dL   AST 29 15 - 41 U/L   ALT 34 0 - 44 U/L   Alkaline Phosphatase 117 38 - 126 U/L   Total Bilirubin 0.3 0.0 - 1.2 mg/dL   GFR, Estimated >39 >39 mL/min   Anion gap 10 5 - 15       RADIOGRAPHIC STUDIES:  CT ABD PELVIS W/WO CM ONCOLOGY PANCREATIC PROTOCOL CLINICAL DATA:  Restaging pancreatic cancer. Currently undergoing neoadjuvant chemotherapy. Assess treatment response.  EXAM: CT ABDOMEN AND PELVIS WITHOUT AND WITH CONTRAST  TECHNIQUE: Multidetector CT imaging of the abdomen and pelvis was performed following the standard protocol before and following the bolus administration of intravenous contrast.  RADIATION DOSE REDUCTION: This exam was performed according to the departmental dose-optimization program which includes automated exposure control, adjustment of the mA and/or kV according to patient size and/or use of iterative reconstruction technique.  CONTRAST:  OMNIPAQUE  IOHEXOL  300 MG/ML  SOLN  COMPARISON:  PET-CT 03/11/2024 and CT scan 02/05/2024  FINDINGS: Lower chest: The lung bases are clear of acute process. No pleural effusion or pulmonary lesions. The heart is normal in size. No pericardial effusion. The distal esophagus and aorta are unremarkable.  Hepatobiliary: Stable hepatic cysts. No  worrisome hepatic lesions to suggest metastatic disease. The gallbladder is surgically absent. No biliary dilatation.  Pancreas: The pancreatic body lesion is very difficult to accurately identify or measure and is a very subtle infiltrative lesion causing obstruction of the main pancreatic duct and upstream dilatation and pancreatic atrophy. My best estimate is that lesion measures 19 x 17 mm on image number 12 of series 9. This would correlate with the remeasured lesion on the prior study from 02/05/2024. On the prior MRI examination this measured approximately 18 x 17 mm. It looks much larger on the PET-CT but again it is very difficult to discretely identify or measure. Overall I do not see any findings for progression.  Spleen: Normal size.  No focal lesions.  Adrenals/Urinary Tract: Adrenal glands and kidneys are unremarkable and stable.  Stomach/Bowel: Small hiatal hernia. Stomach is otherwise unremarkable. The duodenum, small bowel and colon are unremarkable. Stable sigmoid colon diverticulosis.  Vascular/Lymphatic: The aorta and branch vessels are patent. Stable atherosclerotic calcifications. The major venous structures are patent. No periportal or peripancreatic adenopathy.  No mesenteric or retroperitoneal adenopathy.  Reproductive: Surgically absent.  Other: No pelvic mass or adenopathy. No free pelvic fluid collections. No inguinal mass or adenopathy. No abdominal wall hernia or subcutaneous lesions.  Musculoskeletal: No significant bony findings. Stable scoliosis and degenerative lumbar spondylosis.  IMPRESSION: 1. The pancreatic body lesion is very difficult to accurately identify or measure. I do not see any change in size when compared to prior CT and MRI examinations. It did appear much larger on the PET-CT. 2. No findings for metastatic disease involving the abdomen/pelvis. 3. Stable hepatic cysts. 4. Status post cholecystectomy. No biliary  dilatation. 5. Aortic atherosclerosis.  Aortic Atherosclerosis (ICD10-I70.0).  Electronically Signed   By: MYRTIS Stammer M.D.   On: 06/28/2024 20:59    CODE STATUS:  Code Status History     Date Active Date Inactive Code Status Order ID Comments User Context   10/05/2023 0213 10/06/2023 1942 Full Code [id number]  Lee Kingfisher, MD ED    Questions for Most Recent Historical Code Status (Order [id number])     Question Answer   By: Consent: discussion documented in EHR            No orders of the defined types were placed in this encounter.    Future Appointments  Date Time Provider Department Center  08/25/2024  2:40 PM Urbano Albright, MD [facility] CPR  08/29/2024  7:45 AM [facility] FLUSH [facility] None  08/29/2024  8:30 AM [facility] INFUSION [facility] None  08/29/2024  8:45 AM Boluwatife Mutchler, Chinita, MD [facility] None  02/25/2025  1:40 PM [facility]NUAL WELLNESS VISIT [facility] [address]  06/10/2025  7:45 AM Gayland Lauraine PARAS, NP [facility] None      This document was completed utilizing speech recognition software. Grammatical errors, random word insertions, pronoun errors, and incomplete sentences are an occasional consequence of this system due to software limitations, ambient noise, and hardware issues. Any formal questions or concerns about the content, text or information contained within the body of this dictation should be directly addressed to the provider for clarification.   "

## 2024-08-16 ENCOUNTER — Encounter: Payer: Self-pay | Admitting: Oncology

## 2024-08-16 NOTE — Assessment & Plan Note (Signed)
 Please review oncology history for additional details and timeline of events.  Her case was discussed in GI tumor conference on 03/19/2024. Uncertainty remains regarding the second spot in the gastrohepatic ligament, which may be post-biopsy inflammation rather than cancer.   Genetic testing came back negative for any deleterious germline mutations.  Patient had consultation with Dr. 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  The location of this does appear to be relatively central.  It is unclear to me whether this is proximal enough in the body that would require a Whipple or a distal pancreas.  It is not well-seen on imaging.  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 We will start with neoadjuvant chemotherapy.  I will then get repeat imaging.  I do want a pancreatic protocol follow-up scan.   Plan made for neoadjuvant chemotherapy with gemcitabine  and Abraxane , dose reduced for her age and comorbidities, followed by surgical evaluation.  Initially will plan for chemotherapy on days 1, 15 of 28-day cycle and if well-tolerated, we will attempt to do chemo on days 1, 8, 15 of 28-day cycle.  We started with 20% dose reduction of gemcitabine  and Abraxane .  Increased to standard dosing from cycle 2 onwards.  For the first 2 cycles, she received chemotherapy on days 1, 15 of 28-day cycle.  Since she was tolerating chemotherapy very well without major side effects, we attempted to increase the frequency to days 1, 8, 15 of 28-day cycle with cycle 3.  Following completion of cycle 3, she had significant fatigue, shortness of breath, decreased appetite, mouth sores and other side effects that led to ED visit.  She did not have to be hospitalized.    Restaging CT pancreatic protocol on 06/23/2024 showed no significant change compared to prior.  No new disease.  Starting from cycle 4 onwards, we reduced the frequency of  treatments to days 1, 15 of 28-day cycle.  Will consider dose reduction of chemotherapy further depending on symptoms.  Currently due for cycle 5-day 1 of chemotherapy.  Labs reveal no dose-limiting toxicities.  Will proceed with chemotherapy at standard dosing.   RTC in 2 weeks for cycle 5-day 15 of chemotherapy.  Following completion of 6 cycles, restaging imaging will be obtained and we will consider radiation oncology referral based on findings for concurrent chemoradiation.

## 2024-08-19 ENCOUNTER — Other Ambulatory Visit: Payer: Self-pay | Admitting: Family Medicine

## 2024-08-19 DIAGNOSIS — Z8669 Personal history of other diseases of the nervous system and sense organs: Secondary | ICD-10-CM

## 2024-08-22 ENCOUNTER — Inpatient Hospital Stay

## 2024-08-22 ENCOUNTER — Inpatient Hospital Stay: Admitting: Oncology

## 2024-08-22 ENCOUNTER — Other Ambulatory Visit: Payer: Self-pay | Admitting: Family Medicine

## 2024-08-22 DIAGNOSIS — I10 Essential (primary) hypertension: Secondary | ICD-10-CM

## 2024-08-25 ENCOUNTER — Encounter: Admitting: Physical Medicine & Rehabilitation

## 2024-08-28 ENCOUNTER — Other Ambulatory Visit: Payer: Self-pay | Admitting: Family Medicine

## 2024-08-28 DIAGNOSIS — I10 Essential (primary) hypertension: Secondary | ICD-10-CM

## 2024-08-28 DIAGNOSIS — J302 Other seasonal allergic rhinitis: Secondary | ICD-10-CM

## 2024-08-29 ENCOUNTER — Other Ambulatory Visit: Payer: Self-pay

## 2024-08-29 ENCOUNTER — Other Ambulatory Visit: Payer: Self-pay | Admitting: Oncology

## 2024-08-29 ENCOUNTER — Inpatient Hospital Stay

## 2024-08-29 ENCOUNTER — Encounter: Payer: Self-pay | Admitting: Oncology

## 2024-08-29 ENCOUNTER — Inpatient Hospital Stay: Attending: Physician Assistant | Admitting: Oncology

## 2024-08-29 VITALS — BP 132/62 | HR 69 | Temp 98.2°F | Resp 16 | Wt 204.2 lb

## 2024-08-29 DIAGNOSIS — C259 Malignant neoplasm of pancreas, unspecified: Secondary | ICD-10-CM

## 2024-08-29 LAB — CMP (CANCER CENTER ONLY)
ALT: 27 U/L (ref 0–44)
AST: 27 U/L (ref 15–41)
Albumin: 4 g/dL (ref 3.5–5.0)
Alkaline Phosphatase: 102 U/L (ref 38–126)
Anion gap: 10 (ref 5–15)
BUN: 14 mg/dL (ref 8–23)
CO2: 21 mmol/L — ABNORMAL LOW (ref 22–32)
Calcium: 9.4 mg/dL (ref 8.9–10.3)
Chloride: 112 mmol/L — ABNORMAL HIGH (ref 98–111)
Creatinine: 0.81 mg/dL (ref 0.44–1.00)
GFR, Estimated: >60 mL/min
Glucose, Bld: 139 mg/dL — ABNORMAL HIGH (ref 70–99)
Potassium: 3.7 mmol/L (ref 3.5–5.1)
Sodium: 142 mmol/L (ref 135–145)
Total Bilirubin: 0.3 mg/dL (ref 0.0–1.2)
Total Protein: 6.6 g/dL (ref 6.5–8.1)

## 2024-08-29 LAB — CBC WITH DIFFERENTIAL (CANCER CENTER ONLY)
Abs Immature Granulocytes: 0.01 10*3/uL (ref 0.00–0.07)
Basophils Absolute: 0 10*3/uL (ref 0.0–0.1)
Basophils Relative: 0 %
Eosinophils Absolute: 0.1 10*3/uL (ref 0.0–0.5)
Eosinophils Relative: 2 %
HCT: 30.9 % — ABNORMAL LOW (ref 36.0–46.0)
Hemoglobin: 9.4 g/dL — ABNORMAL LOW (ref 12.0–15.0)
Immature Granulocytes: 0 %
Lymphocytes Relative: 29 %
Lymphs Abs: 1.4 10*3/uL (ref 0.7–4.0)
MCH: 27.3 pg (ref 26.0–34.0)
MCHC: 30.4 g/dL (ref 30.0–36.0)
MCV: 89.8 fL (ref 80.0–100.0)
Monocytes Absolute: 0.5 10*3/uL (ref 0.1–1.0)
Monocytes Relative: 11 %
Neutro Abs: 2.7 10*3/uL (ref 1.7–7.7)
Neutrophils Relative %: 58 %
Platelet Count: 173 10*3/uL (ref 150–400)
RBC: 3.44 MIL/uL — ABNORMAL LOW (ref 3.87–5.11)
RDW: 15.9 % — ABNORMAL HIGH (ref 11.5–15.5)
WBC Count: 4.7 10*3/uL (ref 4.0–10.5)
nRBC: 0 % (ref 0.0–0.2)

## 2024-08-29 MED ORDER — PROCHLORPERAZINE MALEATE 10 MG PO TABS
10.0000 mg | ORAL_TABLET | Freq: Once | ORAL | Status: AC
  Administered 2024-08-29: 10 mg via ORAL
  Filled 2024-08-29: qty 1

## 2024-08-29 MED ORDER — SODIUM CHLORIDE 0.9 % IV SOLN
1000.0000 mg/m2 | Freq: Once | INTRAVENOUS | Status: AC
  Administered 2024-08-29: 2052 mg via INTRAVENOUS
  Filled 2024-08-29: qty 53.97

## 2024-08-29 MED ORDER — SODIUM CHLORIDE 0.9 % IV SOLN: INTRAVENOUS | Status: DC

## 2024-08-29 MED ORDER — ACETAMINOPHEN 325 MG PO TABS
650.0000 mg | ORAL_TABLET | Freq: Once | ORAL | Status: AC
  Administered 2024-08-29: 650 mg via ORAL
  Filled 2024-08-29: qty 2

## 2024-08-29 MED ORDER — SODIUM CHLORIDE 0.9% FLUSH: 10.0000 mL | INTRAVENOUS | Status: DC | PRN

## 2024-08-29 MED ORDER — PACLITAXEL PROTEIN-BOUND CHEMO INJECTION 100 MG
123.0000 mg/m2 | Freq: Once | INTRAVENOUS | Status: AC
  Administered 2024-08-29: 250 mg via INTRAVENOUS
  Filled 2024-08-29: qty 50

## 2024-08-29 NOTE — Patient Instructions (Signed)
 [facility] - A DEPT OF [facility] [place] [place]  Discharge Instructions: Thank you for choosing [facility] to provide your oncology and hematology care.   If you have a lab appointment with the Cancer Center, please go directly to the Cancer Center and check in at the registration area.   Wear comfortable clothing and clothing appropriate for easy access to any Portacath or PICC line.   We strive to give you quality time with your provider. You may need to reschedule your appointment if you arrive late (15 or more minutes).  Arriving late affects you and other patients whose appointments are after yours.  Also, if you miss three or more appointments without notifying the office, you may be dismissed from the clinic at the provider's discretion.      For prescription refill requests, have your pharmacy contact our office and allow 72 hours for refills to be completed.    Today you received the following chemotherapy and/or immunotherapy agents abraxane and gemzar       To help prevent nausea and vomiting after your treatment, we encourage you to take your nausea medication as directed.  BELOW ARE SYMPTOMS THAT SHOULD BE REPORTED IMMEDIATELY: *FEVER GREATER THAN 100.4 F (38 C) OR HIGHER *CHILLS OR SWEATING *NAUSEA AND VOMITING THAT IS NOT CONTROLLED WITH YOUR NAUSEA MEDICATION *UNUSUAL SHORTNESS OF BREATH *UNUSUAL BRUISING OR BLEEDING *URINARY PROBLEMS (pain or burning when urinating, or frequent urination) *BOWEL PROBLEMS (unusual diarrhea, constipation, pain near the anus) TENDERNESS IN MOUTH AND THROAT WITH OR WITHOUT PRESENCE OF ULCERS (sore throat, sores in mouth, or a toothache) UNUSUAL RASH, SWELLING OR PAIN  UNUSUAL VAGINAL DISCHARGE OR ITCHING   Items with * indicate a potential emergency and should be followed up as soon as possible or go to the Emergency Department if any problems should occur.  Please show the CHEMOTHERAPY ALERT CARD or  IMMUNOTHERAPY ALERT CARD at check-in to the Emergency Department and triage nurse.  Should you have questions after your visit or need to cancel or reschedule your appointment, please contact [facility] - [facility] DE[facility]  Dept: [phone]  and follow the prompts.  Office hours are 8:00 a.m. to 4:30 p.m. Monday - Friday. Please note that voicemails left after 4:00 p.m. may not be returned until the following business day.  We are closed weekends and major holidays. You have access to a nurse at all times for urgent questions. Please call the main number to the clinic Dept: [phone] and follow the prompts.   For any non-urgent questions, you may also contact your provider using MyChart. We now offer e-Visits for anyone 58 and older to request care online for non-urgent symptoms. For details visit [email or web address].   Also download the MyChart app! Go to the app store, search "MyChart", open the app, select [place], and log in with your MyChart username and password.

## 2024-08-29 NOTE — Assessment & Plan Note (Signed)
 Please review oncology history for additional details and timeline of events.  Her case was discussed in GI tumor conference on 03/19/2024. Uncertainty remains regarding the second spot in the gastrohepatic ligament, which may be post-biopsy inflammation rather than cancer.   Genetic testing came back negative for any deleterious germline mutations.  Patient had consultation with Dr. 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  The location of this does appear to be relatively central.  It is unclear to me whether this is proximal enough in the body that would require a Whipple or a distal pancreas.  It is not well-seen on imaging.  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 We will start with neoadjuvant chemotherapy.  I will then get repeat imaging.  I do want a pancreatic protocol follow-up scan.   Plan made for neoadjuvant chemotherapy with gemcitabine  and Abraxane , dose reduced for her age and comorbidities, followed by surgical evaluation.  Initially will plan for chemotherapy on days 1, 15 of 28-day cycle and if well-tolerated, we will attempt to do chemo on days 1, 8, 15 of 28-day cycle.  We started with 20% dose reduction of gemcitabine  and Abraxane .  Increased to standard dosing from cycle 2 onwards.  For the first 2 cycles, she received chemotherapy on days 1, 15 of 28-day cycle.  Since she was tolerating chemotherapy very well without major side effects, we attempted to increase the frequency to days 1, 8, 15 of 28-day cycle with cycle 3.  Following completion of cycle 3, she had significant fatigue, shortness of breath, decreased appetite, mouth sores and other side effects that led to ED visit.  She did not have to be hospitalized.    Restaging CT pancreatic protocol on 06/23/2024 showed no significant change compared to prior.  No new disease.  Starting from cycle 4 onwards, we reduced the frequency of  treatments to days 1, 15 of 28-day cycle.  Will consider dose reduction of chemotherapy further depending on symptoms.  Currently due for cycle 5-day 15 of chemotherapy.  Labs reveal no dose-limiting toxicities.  Will proceed with chemotherapy at standard dosing.   RTC in 2 weeks for cycle 6-day 1 of chemotherapy.  Following completion of 6 cycles, restaging imaging will be obtained and we will consider radiation oncology referral based on findings for concurrent chemoradiation.

## 2024-08-29 NOTE — Progress Notes (Signed)
 "  [facility]  ONCOLOGY CLINIC PROGRESS NOTE   Patient Care Team: [name], MD as PCP - General (Family Medicine) Mansouraty, Aloha Raddle., MD as Consulting Physician (Gastroenterology)  PATIENT NAME: Gina Key   MR#: [id number] DOB: [birth year]  Date of visit: 08/29/2024   ASSESSMENT & PLAN:   Gina Key is a 79 y.o. lady with a past medical history of hypertension, history of pulmonary embolism in March 2025, seizure disorder, obstructive sleep apnea, arthritis, GERD, was referred to our clinic in August 2025 for pancreatic adenocarcinoma.   Pancreatic adenocarcinoma [facility]) Please review oncology history for additional details and timeline of events.  Her case was discussed in GI tumor conference on 03/19/2024. Uncertainty remains regarding the second spot in the gastrohepatic ligament, which may be post-biopsy inflammation rather than cancer.   Genetic testing came back negative for any deleterious germline mutations.  Patient had consultation with Dr. 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  The location of this does appear to be relatively central.  It is unclear to me whether this is proximal enough in the body that would require a Whipple or a distal pancreas.  It is not well-seen on imaging.  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 We will start with neoadjuvant chemotherapy.  I will then get repeat imaging.  I do want a pancreatic protocol follow-up scan.   Plan made for neoadjuvant chemotherapy with gemcitabine  and Abraxane , dose reduced for her age and comorbidities, followed by surgical evaluation.  Initially will plan for chemotherapy on days 1, 15 of 28-day cycle and if well-tolerated, we will attempt to do chemo on days 1, 8, 15 of 28-day cycle.  We started with 20% dose reduction of gemcitabine  and Abraxane .  Increased to standard dosing from cycle 2  onwards.  For the first 2 cycles, she received chemotherapy on days 1, 15 of 28-day cycle.  Since she was tolerating chemotherapy very well without major side effects, we attempted to increase the frequency to days 1, 8, 15 of 28-day cycle with cycle 3.  Following completion of cycle 3, she had significant fatigue, shortness of breath, decreased appetite, mouth sores and other side effects that led to ED visit.  She did not have to be hospitalized.    Restaging CT pancreatic protocol on 06/23/2024 showed no significant change compared to prior.  No new disease.  Starting from cycle 4 onwards, we reduced the frequency of treatments to days 1, 15 of 28-day cycle.  Will consider dose reduction of chemotherapy further depending on symptoms.  Currently due for cycle 5-day 15 of chemotherapy.  Labs reveal no dose-limiting toxicities.  Will proceed with chemotherapy at standard dosing.   RTC in 2 weeks for cycle 6-day 1 of chemotherapy.  Following completion of 6 cycles, restaging imaging will be obtained and we will consider radiation oncology referral based on findings for concurrent chemoradiation.  Chemotherapy-induced peripheral neuropathy She experiences intermittent neuropathy with difficulty ambulating and dependent edema, attributed to chemotherapy. Edema is due to dependency rather than neuropathy. She is hesitant to initiate duloxetine  due to concerns about sedation and polypharmacy, but is amenable to having a prescription available if symptoms worsen. Neuropathy has not significantly impaired daily function. Discussed that neuropathy may persist post-chemotherapy and may require ongoing supportive care. - Provided education regarding dependent edema and advised leg elevation to minimize swelling. - Discussed duloxetine  as  a treatment option for neuropathy, including risk of sedation if taken at night; she declined initiation but is aware prescription can be provided if symptoms worsen. - Will  reassess for worsening neuropathy or functional impairment and revisit pharmacologic options if symptoms progress.  Chemotherapy-induced anemia She has mild, asymptomatic anemia with hemoglobin at 9.4 g/dL, unchanged from prior labs. No transfusion required and able to proceed with chemotherapy. - Ordered hemoglobin monitoring with each cycle. - Continue conservative management; no intervention unless anemia worsens or symptoms develop.  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 Blood counts are well-managed. - Continue Eliquis  for anticoagulation. - Monitor blood counts regularly during chemotherapy.   I reviewed lab results and outside records for this visit and discussed relevant results with the patient. Diagnosis, plan of care and treatment options were also discussed in detail with the patient. Opportunity provided to ask questions and answers provided to her apparent satisfaction. Provided instructions to call our clinic with any problems, questions or concerns prior to return visit. I recommended to continue follow-up with PCP and sub-specialists. She verbalized understanding and agreed with the plan.   NCCN guidelines have been consulted in the planning of this patients care.  I spent a total of 43 minutes during this encounter with the patient including review of chart and various tests results, discussions about plan of care and coordination of care plan.   [name], MD  08/29/2024 11:08 AM  [facility] - A DEPT OF [facility] [address] [place] [address] Dept: [phone] Dept Fax: [phone]    CHIEF COMPLAINT/ REASON FOR VISIT:   Adenocarcinoma of pancreas, Stage I vs stage II  Current Treatment: Plan for neoadjuvant chemotherapy with dose reduce gemcitabine  and Abraxane , followed by surgical evaluation.  Started systemic  treatment with gemcitabine  and Abraxane  from 04/11/2024.  INTERVAL HISTORY:    Discussed the use of AI scribe software for clinical note transcription with the patient, who gave verbal consent to proceed.  History of Present Illness  Gina Key is a 79 year old female with stage II B pancreatic adenocarcinoma undergoing neoadjuvant chemotherapy who presents for evaluation of treatment response.  She is currently receiving cycle five of neoadjuvant gemcitabine  and nab-paclitaxel  for pancreatic adenocarcinoma, administered every two weeks. Hemoglobin is 9.4 g/dL, similar to prior cycles.  She reports chemotherapy-induced peripheral neuropathy, which intermittently impairs ambulation, stating that sometimes I can't hardly walk. She also has significant dependent edema of the feet and ankles, worsening throughout the day. She does not use compression socks due to discomfort. Duloxetine  was discussed for neuropathy management, but she prefers to avoid additional medications due to concerns about sedation.  She experiences intermittent sinus discomfort localized to the nasal tip, occurring seasonally, with associated clear rhinorrhea, sneezing, and cough. She denies fever, chills, night sweats, or headache. She uses allergy medications and nasal sprays at home and states that her sinus symptoms are not currently bothersome.  Aug 15, 2024: Follow-up for pancreatic adenocarcinoma, Stage IIB, after neoadjuvant chemotherapy (gemcitabine  and Abraxane ) with 20% dose reduction initiated 04/11/2024; experiencing mild, conservatively managed chemotherapy-induced peripheral neuropathy and asymptomatic anemia. Restaging CT (06/23/2024) showed stable disease with no new metastases; patient currently due for cycle 5-day 1 of chemotherapy as labs allow standard dosing. Plan to proceed with current regimen, monitor blood counts, and reassess with imaging after 6 cycles for potential radiation oncology  referral.   I have reviewed  the past medical history, past surgical history, social history and family history with the patient and they are unchanged from previous note.  HISTORY OF PRESENT ILLNESS:   ONCOLOGY HISTORY:   On review of the previous records patient was evaluated in the emergency department on 02/05/2024 for gross hematuria.  CT abdomen pelvis was performed which showed moderate dilation of the pancreatic duct in the mid body and tail overlying and parenchymal atrophy. MR abdomen was ordered by PCP Dr. Mercer and performed on 02/07/24. MRI showed a focal segment of pancreatic ductal dilatation with abrupt caliber change and stricture. Subtle mass lesion in this location identified measuring 17 x 15 mm at the level of the caliber change and has appearance worrisome for neoplasm such as an adenocarcinoma. Image did not show any other areas concerning for malignant disease.    CMP collected 02/05/24 in the ED was overall unremarkable. Further chart review shows patient was diagnosed with a submassive PE with right heart strain 10/05/23 and is currently taking Eliquis .     She is retired, having worked in associate professor. She does not smoke or drink alcohol. She currently lives with her daughter due to past seizures. Her last colonoscopy in February 2024 was normal, as was her last mammogram. Her family history is significant for her mother having pancreatic cancer at age 82, her brother having throat cancer, and her daughter having kidney cancer.   She was seen in our rapid diagnostic clinic on 02/12/2024 for consultation and was referred to GI for EUS/biopsy.  Labs on that day showed a CA 19-9 of 8, CEA undetectable, chromogranin A increased at 224.8 ng/mL.  CBCD and CMP were unremarkable with normal LFTs.   On 03/03/2024 Dr. Wilhelmenia performed EGD and EUS.  On EUS, mass was identified in the pancreatic body.  Endosonographic appearance was highly suspicious for adenocarcinoma.  Staged  as T1c, N0, MX.  FNA performed.  There was no sign of significant pathology in the CBD and in the common hepatic duct.  No malignant appearing lymph nodes were visualized in the celiac region, peripancreatic region and porta hepatis.  A cyst was found in the visualized portion of the liver measured 26 mm x 23 mm.  No evidence of any solid mass lesions in the left lobe of liver.   FNA from pancreatic mass came back positive for adenocarcinoma.   On 03/11/2024, staging PET scan showed ill-defined heterogeneous low-level hypermetabolic in the mid pancreas, corresponding to the abnormality on previous MRI.  Features compatible with biopsy-proven pancreatic adenocarcinoma.  16 x 11 mm focus of amorphous soft tissue in the gastrohepatic ligament was hypermetabolic and concerning for metastatic disease.  No evidence of hypermetabolic liver metastasis.  No evidence of metastatic disease elsewhere.   Her case was discussed in GI tumor conference on 03/19/2024. Uncertainty remains regarding the second spot in the gastrohepatic ligament, which may be post-biopsy inflammation rather than cancer.   Genetic testing came back negative for any deleterious germline mutations.  Patient had consultation with Dr. 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  The location of this does appear to be relatively central.  It is unclear to me whether this is proximal enough in the body that would require a Whipple or a distal pancreas.  It is not well-seen on imaging.  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 We will start with neoadjuvant chemotherapy.  I will then  get repeat imaging.  I do want a pancreatic protocol follow-up scan.   Plan for neoadjuvant chemotherapy with gemcitabine  and Abraxane , dose reduced for her age and comorbidities, followed by surgical evaluation.  Started systemic treatment with gemcitabine  and Abraxane  from  04/11/2024.  For the first 2 cycles, she received chemotherapy on days 1, 15 of 28-day cycle.  Since she was tolerating chemotherapy very well without major side effects, we attempted to increase the frequency to days 1, 8, 15 of 28-day cycle with cycle 3.  Following completion of cycle 3, she had significant fatigue, shortness of breath, decreased appetite, mouth sores and other side effects that led to ED visit.  She did not have her to be hospitalized.    Restaging CT pancreatic protocol on 06/23/2024 showed no significant change compared to prior.  No new disease.  Starting from cycle 4 onwards, we reduced the frequency of treatments to days 1, 15 of 28-day cycle.  Will consider dose reduction of chemotherapy further depending on symptoms.  Oncology History  Pancreatic adenocarcinoma (HCC)  03/03/2024 Initial Diagnosis   Pancreatic adenocarcinoma (HCC)   03/21/2024 Cancer Staging   Staging form: Exocrine Pancreas, AJCC 8th Edition - Clinical: Stage IIB (cT1c, cN1, cM0) - Signed by Autumn Millman, MD on 04/11/2024   03/31/2024 Genetic Testing   Negative genetic testing. No pathogenic variants identified on the [facility] CancerNext-Expanded+RNA Panel. The report date is 03/31/2024.   The CancerNext-Expanded gene panel offered by [facility] and includes sequencing, rearrangement, and RNA analysis for the following 77 genes: AIP, ALK, APC, ATM, AXIN2, BAP1, BARD1, BMPR1A, BRCA1, BRCA2, BRIP1, CDC73, CDH1, CDK4, CDKN1B, CDKN2A, CEBPA, CHEK2, CTNNA1, DDX41, DICER1, ETV6, FH, FLCN, GATA2, LZTR1, MAX, MBD4, MEN1, MET, MLH1, MSH2, MSH3, MSH6, MUTYH, NF1, NF2, NTHL1, PALB2, PHOX2B, PMS2, POT1, PRKAR1A, PTCH1, PTEN, RAD51C, RAD51D, RB1, RET, RPS20, RUNX1, SDHA, SDHAF2, SDHB, SDHC, SDHD, SMAD4, SMARCA4, SMARCB1, SMARCE1, STK11, SUFU, TMEM127, TP53, TSC1, TSC2, VHL, and WT1 (sequencing and deletion/duplication); EGFR, HOXB13, KIT, MITF, PDGFRA, POLD1, and POLE (sequencing only); EPCAM and GREM1  (deletion/duplication only).    04/11/2024 -  Chemotherapy   Patient is on Treatment Plan : PANCREATIC Abraxane  D1,15 + Gemcitabine  D1,15 q28d         REVIEW OF SYSTEMS:   Review of Systems - Oncology  All other pertinent systems were reviewed with the patient and are negative.  ALLERGIES: She has no known allergies.  MEDICATIONS:  Current Outpatient Medications  Medication Sig Dispense Refill   atorvastatin  (LIPITOR) 40 MG tablet      benzonatate  (TESSALON ) 100 MG capsule Take 1 capsule (100 mg total) by mouth 2 (two) times daily as needed for cough. 20 capsule 0   ELIQUIS  5 MG TABS tablet TAKE 1 TABLET BY MOUTH TWICE A DAY 180 tablet 1   ferrous sulfate 325 (65 FE) MG tablet Take 325 mg by mouth at bedtime.     Fexofenadine HCl (ALLEGRA PO) Take by mouth.     Incontinence Supply Disposable (WINGS HL ADULT BRIEFS/XL) MISC As needed daily. 100 each 11   ipratropium (ATROVENT ) 0.06 % nasal spray PLACE 2 SPRAYS INTO BOTH NOSTRILS 4 TIMES DAILY. 45 mL 1   levETIRAcetam  (KEPPRA ) 500 MG tablet Take 1 tablet (500 mg total) by mouth 2 (two) times daily. 180 tablet 3   lidocaine  (HM LIDOCAINE  PATCH) 4 % Place 1 patch onto the skin daily as needed.     lidocaine -prilocaine  (EMLA ) cream Apply to affected area once 30 g 3   lipase/protease/amylase (  CREON ) 36000 UNITS CPEP capsule Take 2 capsules (72,000 Units total) by mouth 3 (three) times daily with meals. May also take 1 capsule (36,000 Units total) as needed (with snacks - up to 4 snacks daily). 300 capsule 11   losartan  (COZAAR ) 100 MG tablet TAKE 1 TABLET BY MOUTH EVERY DAY 90 tablet 1   metoprolol  succinate (TOPROL -XL) 25 MG 24 hr tablet TAKE 1 TABLET (25 MG TOTAL) BY MOUTH DAILY. 90 tablet 1   montelukast  (SINGULAIR ) 10 MG tablet TAKE 1 TABLET BY MOUTH EVERYDAY AT BEDTIME 90 tablet 1   omeprazole (PRILOSEC) 20 MG capsule TAKE 1 CAPSULE EVERY DAY 90 capsule 1   ondansetron  (ZOFRAN ) 8 MG tablet Take 1 tablet (8 mg total) by mouth every 8  (eight) hours as needed for nausea or vomiting. 30 tablet 1   oxyCODONE  (ROXICODONE ) 5 MG immediate release tablet Take 1-2 tablets (5-10 mg total) by mouth every 12 (twelve) hours as needed for severe pain (pain score 7-10) or moderate pain (pain score 4-6) (1 tablet for moderate pain, up to 2 tablets for severe). 90 tablet 0   potassium chloride  (KLOR-CON ) 10 MEQ tablet TAKE 1 TABLET BY MOUTH EVERY DAY 90 tablet 3   prochlorperazine  (COMPAZINE ) 10 MG tablet Take 1 tablet (10 mg total) by mouth every 6 (six) hours as needed for nausea or vomiting. 30 tablet 1   rosuvastatin  (CRESTOR ) 20 MG tablet Take 1 tablet (20 mg total) by mouth daily. 90 tablet 3   simethicone (MYLICON) 125 MG chewable tablet Chew 125 mg by mouth every 6 (six) hours as needed for flatulence.     spironolactone  (ALDACTONE ) 25 MG tablet TAKE 1 TABLET (25 MG TOTAL) BY MOUTH DAILY. 90 tablet 1   topiramate  (TOPAMAX ) 100 MG tablet TAKE 1 TABLET BY MOUTH TWICE A DAY 180 tablet 1   Current Facility-Administered Medications  Medication Dose Route Frequency Provider Last Rate Last Admin   lidocaine  (XYLOCAINE ) 1 % (with pres) injection 1 mL  1 mL Other Once        lidocaine  (XYLOCAINE ) 1 % (with pres) injection 4 mL  4 mL Other Once        triamcinolone  acetonide (KENALOG -40) injection 40 mg  40 mg Intramuscular Once        triamcinolone  acetonide (KENALOG -40) injection 40 mg  40 mg Intra-articular Once        Facility-Administered Medications Ordered in Other Visits  Medication Dose Route Frequency Provider Last Rate Last Admin   0.9 %  sodium chloride  infusion   Intravenous Continuous Donnalyn Juran, MD 10 mL/hr at 08/29/24 0901 New Bag at 08/29/24 0901   gemcitabine  (GEMZAR ) 2,052 mg in sodium chloride  0.9 % 250 mL chemo infusion  1,000 mg/m2 (Treatment Plan Recorded) Intravenous Once Lakesia Dahle, MD 608 mL/hr at 08/29/24 1057 2,052 mg at 08/29/24 1057   sodium chloride  flush (NS) 0.9 % injection 10 mL  10 mL Intracatheter PRN  Thessaly Mccullers, MD         VITALS:   There were no vitals taken for this visit.  Wt Readings from Last 3 Encounters:  08/29/24 204 lb 4 oz (92.6 kg)  08/15/24 203 lb (92.1 kg)  08/01/24 206 lb 6.4 oz (93.6 kg)    There is no height or weight on file to calculate BMI.    Onc Performance Status - 08/29/24 1100       ECOG Perf Status   ECOG Perf Status Ambulatory and capable of all selfcare but unable  to carry out any work activities.  Up and about more than 50% of waking hours      KPS SCALE   KPS % SCORE Cares for self, unable to carry on normal activity or to do active work             PHYSICAL EXAM:   Physical Exam Constitutional:      General: She is not in acute distress.    Appearance: Normal appearance.  HENT:     Head: Normocephalic and atraumatic.  Cardiovascular:     Rate and Rhythm: Normal rate.  Pulmonary:     Effort: Pulmonary effort is normal. No respiratory distress.  Abdominal:     General: There is no distension.  Neurological:     General: No focal deficit present.     Mental Status: She is alert and oriented to person, place, and time.  Psychiatric:        Mood and Affect: Mood normal.        Behavior: Behavior normal.       LABORATORY DATA:   I have reviewed the data as listed.  Results for orders placed or performed in visit on 08/29/24  CMP (Cancer Center only)  Result Value Ref Range   Sodium 142 135 - 145 mmol/L   Potassium 3.7 3.5 - 5.1 mmol/L   Chloride 112 (H) 98 - 111 mmol/L   CO2 21 (L) 22 - 32 mmol/L   Glucose, Bld 139 (H) 70 - 99 mg/dL   BUN 14 8 - 23 mg/dL   Creatinine 9.18 9.55 - 1.00 mg/dL   Calcium  9.4 8.9 - 10.3 mg/dL   Total Protein 6.6 6.5 - 8.1 g/dL   Albumin 4.0 3.5 - 5.0 g/dL   AST 27 15 - 41 U/L   ALT 27 0 - 44 U/L   Alkaline Phosphatase 102 38 - 126 U/L   Total Bilirubin 0.3 0.0 - 1.2 mg/dL   GFR, Estimated >39 >39 mL/min   Anion gap 10 5 - 15  CBC with Differential (Cancer Center Only)  Result  Value Ref Range   WBC Count 4.7 4.0 - 10.5 K/uL   RBC 3.44 (L) 3.87 - 5.11 MIL/uL   Hemoglobin 9.4 (L) 12.0 - 15.0 g/dL   HCT 69.0 (L) 63.9 - 53.9 %   MCV 89.8 80.0 - 100.0 fL   MCH 27.3 26.0 - 34.0 pg   MCHC 30.4 30.0 - 36.0 g/dL   RDW 84.0 (H) 88.4 - 84.4 %   Platelet Count 173 150 - 400 K/uL   nRBC 0.0 0.0 - 0.2 %   Neutrophils Relative % 58 %   Neutro Abs 2.7 1.7 - 7.7 K/uL   Lymphocytes Relative 29 %   Lymphs Abs 1.4 0.7 - 4.0 K/uL   Monocytes Relative 11 %   Monocytes Absolute 0.5 0.1 - 1.0 K/uL   Eosinophils Relative 2 %   Eosinophils Absolute 0.1 0.0 - 0.5 K/uL   Basophils Relative 0 %   Basophils Absolute 0.0 0.0 - 0.1 K/uL   Immature Granulocytes 0 %   Abs Immature Granulocytes 0.01 0.00 - 0.07 K/uL       RADIOGRAPHIC STUDIES:  CT ABD PELVIS W/WO CM ONCOLOGY PANCREATIC PROTOCOL CLINICAL DATA:  Restaging pancreatic cancer. Currently undergoing neoadjuvant chemotherapy. Assess treatment response.  EXAM: CT ABDOMEN AND PELVIS WITHOUT AND WITH CONTRAST  TECHNIQUE: Multidetector CT imaging of the abdomen and pelvis was performed following the standard protocol before and following the bolus  administration of intravenous contrast.  RADIATION DOSE REDUCTION: This exam was performed according to the departmental dose-optimization program which includes automated exposure control, adjustment of the mA and/or kV according to patient size and/or use of iterative reconstruction technique.  CONTRAST:  OMNIPAQUE  IOHEXOL  300 MG/ML  SOLN  COMPARISON:  PET-CT 03/11/2024 and CT scan 02/05/2024  FINDINGS: Lower chest: The lung bases are clear of acute process. No pleural effusion or pulmonary lesions. The heart is normal in size. No pericardial effusion. The distal esophagus and aorta are unremarkable.  Hepatobiliary: Stable hepatic cysts. No worrisome hepatic lesions to suggest metastatic disease. The gallbladder is surgically absent. No biliary  dilatation.  Pancreas: The pancreatic body lesion is very difficult to accurately identify or measure and is a very subtle infiltrative lesion causing obstruction of the main pancreatic duct and upstream dilatation and pancreatic atrophy. My best estimate is that lesion measures 19 x 17 mm on image number 12 of series 9. This would correlate with the remeasured lesion on the prior study from 02/05/2024. On the prior MRI examination this measured approximately 18 x 17 mm. It looks much larger on the PET-CT but again it is very difficult to discretely identify or measure. Overall I do not see any findings for progression.  Spleen: Normal size.  No focal lesions.  Adrenals/Urinary Tract: Adrenal glands and kidneys are unremarkable and stable.  Stomach/Bowel: Small hiatal hernia. Stomach is otherwise unremarkable. The duodenum, small bowel and colon are unremarkable. Stable sigmoid colon diverticulosis.  Vascular/Lymphatic: The aorta and branch vessels are patent. Stable atherosclerotic calcifications. The major venous structures are patent. No periportal or peripancreatic adenopathy. No mesenteric or retroperitoneal adenopathy.  Reproductive: Surgically absent.  Other: No pelvic mass or adenopathy. No free pelvic fluid collections. No inguinal mass or adenopathy. No abdominal wall hernia or subcutaneous lesions.  Musculoskeletal: No significant bony findings. Stable scoliosis and degenerative lumbar spondylosis.  IMPRESSION: 1. The pancreatic body lesion is very difficult to accurately identify or measure. I do not see any change in size when compared to prior CT and MRI examinations. It did appear much larger on the PET-CT. 2. No findings for metastatic disease involving the abdomen/pelvis. 3. Stable hepatic cysts. 4. Status post cholecystectomy. No biliary dilatation. 5. Aortic atherosclerosis.  Aortic Atherosclerosis (ICD10-I70.0).  Electronically Signed   By: MYRTIS Stammer M.D.   On: 06/28/2024 20:59    CODE STATUS:  Code Status History     Date Active Date Inactive Code Status Order ID Comments User Context   10/05/2023 0213 10/06/2023 1942 Full Code [id number]  Lee Kingfisher, MD ED    Questions for Most Recent Historical Code Status (Order [id number])     Question Answer   By: Consent: discussion documented in EHR            No orders of the defined types were placed in this encounter.    Future Appointments  Date Time Provider Department Center  09/19/2024  7:45 AM [facility] FLUSH [facility] None  09/19/2024  8:30 AM [facility] INFUSION [facility] None  09/19/2024  8:45 AM Noriko Macari, MD [facility] None  10/03/2024  7:45 AM [facility] FLUSH [facility] None  10/03/2024  8:30 AM [facility] INFUSION [facility] None  10/03/2024  8:45 AM Javaris Wigington, Chinita, MD [facility] None  10/06/2024  3:20 PM Urbano Albright, MD [facility] CPR  02/25/2025  1:40 PM [facility]-ANNUAL WELLNESS VISIT [facility] [address]  06/10/2025  7:45 AM Gayland Lauraine PARAS, NP [facility] None  This document was completed utilizing speech recognition software. Grammatical errors, random word insertions, pronoun errors, and incomplete sentences are an occasional consequence of this system due to software limitations, ambient noise, and hardware issues. Any formal questions or concerns about the content, text or information contained within the body of this dictation should be directly addressed to the provider for clarification.   "

## 2024-09-05 ENCOUNTER — Inpatient Hospital Stay

## 2024-09-05 ENCOUNTER — Inpatient Hospital Stay: Admitting: Oncology

## 2024-09-12 ENCOUNTER — Inpatient Hospital Stay: Admitting: Oncology

## 2024-09-12 ENCOUNTER — Inpatient Hospital Stay

## 2024-09-19 ENCOUNTER — Inpatient Hospital Stay: Admitting: Oncology

## 2024-09-19 ENCOUNTER — Inpatient Hospital Stay

## 2024-09-26 ENCOUNTER — Inpatient Hospital Stay: Admitting: Oncology

## 2024-09-26 ENCOUNTER — Inpatient Hospital Stay

## 2024-09-26 ENCOUNTER — Inpatient Hospital Stay: Attending: Physician Assistant

## 2024-10-03 ENCOUNTER — Inpatient Hospital Stay: Attending: Physician Assistant

## 2024-10-03 ENCOUNTER — Inpatient Hospital Stay

## 2024-10-03 ENCOUNTER — Inpatient Hospital Stay: Admitting: Oncology

## 2024-10-06 ENCOUNTER — Encounter: Admitting: Physical Medicine & Rehabilitation

## 2025-02-25 ENCOUNTER — Ambulatory Visit

## 2025-06-10 ENCOUNTER — Ambulatory Visit: Admitting: Neurology
